# Patient Record
Sex: Female | Born: 1940
Health system: Southern US, Community
[De-identification: ages and names within clinical notes are randomized; demographics above are authoritative.]

## PROBLEM LIST (undated history)

## (undated) DIAGNOSIS — G4730 Sleep apnea, unspecified: Secondary | ICD-10-CM

## (undated) DIAGNOSIS — D649 Anemia, unspecified: Secondary | ICD-10-CM

## (undated) DIAGNOSIS — R600 Localized edema: Secondary | ICD-10-CM

## (undated) DIAGNOSIS — I509 Heart failure, unspecified: Secondary | ICD-10-CM

## (undated) DIAGNOSIS — I10 Essential (primary) hypertension: Secondary | ICD-10-CM

## (undated) DIAGNOSIS — R809 Proteinuria, unspecified: Secondary | ICD-10-CM

## (undated) DIAGNOSIS — M1990 Unspecified osteoarthritis, unspecified site: Secondary | ICD-10-CM

## (undated) DIAGNOSIS — M2550 Pain in unspecified joint: Secondary | ICD-10-CM

## (undated) DIAGNOSIS — Z8711 Personal history of peptic ulcer disease: Secondary | ICD-10-CM

## (undated) DIAGNOSIS — R5383 Other fatigue: Secondary | ICD-10-CM

## (undated) DIAGNOSIS — R0602 Shortness of breath: Secondary | ICD-10-CM

## (undated) DIAGNOSIS — H269 Unspecified cataract: Secondary | ICD-10-CM

## (undated) DIAGNOSIS — G2581 Restless legs syndrome: Secondary | ICD-10-CM

## (undated) DIAGNOSIS — H3530 Unspecified macular degeneration: Secondary | ICD-10-CM

## (undated) DIAGNOSIS — E538 Deficiency of other specified B group vitamins: Secondary | ICD-10-CM

## (undated) DIAGNOSIS — R1310 Dysphagia, unspecified: Secondary | ICD-10-CM

## (undated) DIAGNOSIS — K5900 Constipation, unspecified: Secondary | ICD-10-CM

## (undated) DIAGNOSIS — J309 Allergic rhinitis, unspecified: Secondary | ICD-10-CM

## (undated) DIAGNOSIS — K829 Disease of gallbladder, unspecified: Secondary | ICD-10-CM

## (undated) DIAGNOSIS — E785 Hyperlipidemia, unspecified: Secondary | ICD-10-CM

## (undated) DIAGNOSIS — E559 Vitamin D deficiency, unspecified: Secondary | ICD-10-CM

## (undated) DIAGNOSIS — M5430 Sciatica, unspecified side: Secondary | ICD-10-CM

## (undated) DIAGNOSIS — R7303 Prediabetes: Secondary | ICD-10-CM

## (undated) DIAGNOSIS — K219 Gastro-esophageal reflux disease without esophagitis: Secondary | ICD-10-CM

## (undated) DIAGNOSIS — N183 Chronic kidney disease, stage 3 unspecified: Secondary | ICD-10-CM

## (undated) DIAGNOSIS — M549 Dorsalgia, unspecified: Secondary | ICD-10-CM

## (undated) DIAGNOSIS — R609 Edema, unspecified: Secondary | ICD-10-CM

## (undated) HISTORY — DX: Heart failure, unspecified: I50.9

## (undated) HISTORY — DX: Sleep apnea, unspecified: G47.30

## (undated) HISTORY — DX: Disease of gallbladder, unspecified: K82.9

## (undated) HISTORY — DX: Edema, unspecified: R60.9

## (undated) HISTORY — DX: Restless legs syndrome: G25.81

## (undated) HISTORY — DX: Prediabetes: R73.03

## (undated) HISTORY — DX: Vitamin D deficiency, unspecified: E55.9

## (undated) HISTORY — DX: Other fatigue: R53.83

## (undated) HISTORY — DX: Deficiency of other specified B group vitamins: E53.8

## (undated) HISTORY — DX: Anemia, unspecified: D64.9

## (undated) HISTORY — DX: Proteinuria, unspecified: R80.9

## (undated) HISTORY — DX: Unspecified cataract: H26.9

## (undated) HISTORY — DX: Localized edema: R60.0

## (undated) HISTORY — DX: Dorsalgia, unspecified: M54.9

## (undated) HISTORY — DX: Personal history of peptic ulcer disease: Z87.11

## (undated) HISTORY — DX: Shortness of breath: R06.02

## (undated) HISTORY — DX: Unspecified osteoarthritis, unspecified site: M19.90

## (undated) HISTORY — DX: Pain in unspecified joint: M25.50

## (undated) HISTORY — DX: Dysphagia, unspecified: R13.10

## (undated) HISTORY — DX: Gastro-esophageal reflux disease without esophagitis: K21.9

## (undated) HISTORY — DX: Unspecified macular degeneration: H35.30

## (undated) HISTORY — DX: Constipation, unspecified: K59.00

## (undated) HISTORY — DX: Essential (primary) hypertension: I10

## (undated) HISTORY — DX: Allergic rhinitis, unspecified: J30.9

## (undated) HISTORY — DX: Chronic kidney disease, stage 3 unspecified: N18.30

## (undated) HISTORY — DX: Hyperlipidemia, unspecified: E78.5

---

## 1976-11-29 HISTORY — PX: TUBAL LIGATION: SHX77

## 2000-11-29 HISTORY — PX: CHOLECYSTECTOMY: SHX55

## 2008-01-04 ENCOUNTER — Ambulatory Visit: Payer: Self-pay | Admitting: Unknown Physician Specialty

## 2008-03-05 ENCOUNTER — Ambulatory Visit: Payer: Self-pay | Admitting: Gastroenterology

## 2009-09-24 ENCOUNTER — Ambulatory Visit: Payer: Self-pay | Admitting: Unknown Physician Specialty

## 2010-03-10 ENCOUNTER — Encounter: Admission: RE | Admit: 2010-03-10 | Discharge: 2010-03-10 | Payer: Self-pay | Admitting: Orthopedic Surgery

## 2010-11-20 ENCOUNTER — Ambulatory Visit: Payer: Self-pay | Admitting: Unknown Physician Specialty

## 2011-02-26 ENCOUNTER — Ambulatory Visit: Payer: Self-pay | Admitting: Gastroenterology

## 2011-03-02 LAB — PATHOLOGY REPORT

## 2011-03-23 ENCOUNTER — Ambulatory Visit: Payer: Self-pay | Admitting: Gastroenterology

## 2012-06-07 ENCOUNTER — Ambulatory Visit: Payer: Self-pay | Admitting: Internal Medicine

## 2012-10-19 DIAGNOSIS — D510 Vitamin B12 deficiency anemia due to intrinsic factor deficiency: Secondary | ICD-10-CM | POA: Insufficient documentation

## 2012-10-19 DIAGNOSIS — E785 Hyperlipidemia, unspecified: Secondary | ICD-10-CM | POA: Insufficient documentation

## 2012-10-19 DIAGNOSIS — E782 Mixed hyperlipidemia: Secondary | ICD-10-CM | POA: Insufficient documentation

## 2012-10-19 DIAGNOSIS — E559 Vitamin D deficiency, unspecified: Secondary | ICD-10-CM | POA: Insufficient documentation

## 2012-10-19 DIAGNOSIS — E1169 Type 2 diabetes mellitus with other specified complication: Secondary | ICD-10-CM | POA: Insufficient documentation

## 2013-05-16 LAB — HM DEXA SCAN: HM DEXA SCAN: NORMAL

## 2013-06-16 DIAGNOSIS — B372 Candidiasis of skin and nail: Secondary | ICD-10-CM | POA: Insufficient documentation

## 2013-07-27 DIAGNOSIS — I5189 Other ill-defined heart diseases: Secondary | ICD-10-CM | POA: Insufficient documentation

## 2014-05-23 DIAGNOSIS — E669 Obesity, unspecified: Secondary | ICD-10-CM

## 2015-02-11 LAB — TSH: TSH: 3.52 (ref <=5.90)

## 2015-02-18 DIAGNOSIS — H353120 Nonexudative age-related macular degeneration, left eye, stage unspecified: Secondary | ICD-10-CM | POA: Insufficient documentation

## 2015-02-18 DIAGNOSIS — H353210 Exudative age-related macular degeneration, right eye, stage unspecified: Secondary | ICD-10-CM | POA: Insufficient documentation

## 2015-03-31 DIAGNOSIS — J411 Mucopurulent chronic bronchitis: Secondary | ICD-10-CM | POA: Insufficient documentation

## 2015-10-31 DIAGNOSIS — H2513 Age-related nuclear cataract, bilateral: Secondary | ICD-10-CM | POA: Insufficient documentation

## 2015-10-31 DIAGNOSIS — H353124 Nonexudative age-related macular degeneration, left eye, advanced atrophic with subfoveal involvement: Secondary | ICD-10-CM | POA: Insufficient documentation

## 2015-10-31 DIAGNOSIS — H353210 Exudative age-related macular degeneration, right eye, stage unspecified: Secondary | ICD-10-CM | POA: Insufficient documentation

## 2016-01-14 DIAGNOSIS — J0140 Acute pansinusitis, unspecified: Secondary | ICD-10-CM | POA: Diagnosis not present

## 2016-01-14 DIAGNOSIS — M791 Myalgia: Secondary | ICD-10-CM | POA: Diagnosis not present

## 2016-01-14 DIAGNOSIS — J209 Acute bronchitis, unspecified: Secondary | ICD-10-CM | POA: Diagnosis not present

## 2016-01-14 DIAGNOSIS — R05 Cough: Secondary | ICD-10-CM | POA: Diagnosis not present

## 2016-02-27 DIAGNOSIS — H353122 Nonexudative age-related macular degeneration, left eye, intermediate dry stage: Secondary | ICD-10-CM | POA: Diagnosis not present

## 2016-02-27 DIAGNOSIS — H353212 Exudative age-related macular degeneration, right eye, with inactive choroidal neovascularization: Secondary | ICD-10-CM | POA: Diagnosis not present

## 2016-02-27 DIAGNOSIS — H2513 Age-related nuclear cataract, bilateral: Secondary | ICD-10-CM | POA: Diagnosis not present

## 2016-02-27 DIAGNOSIS — H353124 Nonexudative age-related macular degeneration, left eye, advanced atrophic with subfoveal involvement: Secondary | ICD-10-CM | POA: Diagnosis not present

## 2016-02-27 DIAGNOSIS — E119 Type 2 diabetes mellitus without complications: Secondary | ICD-10-CM | POA: Diagnosis not present

## 2016-03-26 DIAGNOSIS — E559 Vitamin D deficiency, unspecified: Secondary | ICD-10-CM | POA: Diagnosis not present

## 2016-03-26 DIAGNOSIS — N183 Chronic kidney disease, stage 3 (moderate): Secondary | ICD-10-CM | POA: Diagnosis not present

## 2016-03-26 DIAGNOSIS — E1122 Type 2 diabetes mellitus with diabetic chronic kidney disease: Secondary | ICD-10-CM | POA: Diagnosis not present

## 2016-03-26 DIAGNOSIS — D510 Vitamin B12 deficiency anemia due to intrinsic factor deficiency: Secondary | ICD-10-CM | POA: Diagnosis not present

## 2016-03-26 DIAGNOSIS — I10 Essential (primary) hypertension: Secondary | ICD-10-CM | POA: Diagnosis not present

## 2016-03-26 DIAGNOSIS — E782 Mixed hyperlipidemia: Secondary | ICD-10-CM | POA: Diagnosis not present

## 2016-03-26 LAB — LIPID PANEL
CHOLESTEROL: 203 — AB (ref 0–200)
HDL: 68 (ref 35–70)
LDL CALC: 103
Triglycerides: 159 (ref 40–160)

## 2016-03-26 LAB — BASIC METABOLIC PANEL
BUN: 18 (ref 4–21)
CREATININE: 1 (ref 0.5–1.1)
Glucose: 107

## 2016-03-26 LAB — MICROALBUMIN, URINE: Microalb, Ur: <3

## 2016-04-02 DIAGNOSIS — N183 Chronic kidney disease, stage 3 unspecified: Secondary | ICD-10-CM | POA: Insufficient documentation

## 2016-04-02 DIAGNOSIS — I10 Essential (primary) hypertension: Secondary | ICD-10-CM | POA: Diagnosis not present

## 2016-04-02 DIAGNOSIS — R1033 Periumbilical pain: Secondary | ICD-10-CM | POA: Diagnosis not present

## 2016-04-02 DIAGNOSIS — R7309 Other abnormal glucose: Secondary | ICD-10-CM | POA: Diagnosis not present

## 2016-04-02 DIAGNOSIS — R10816 Epigastric abdominal tenderness: Secondary | ICD-10-CM | POA: Diagnosis not present

## 2016-04-02 DIAGNOSIS — Z1231 Encounter for screening mammogram for malignant neoplasm of breast: Secondary | ICD-10-CM | POA: Diagnosis not present

## 2016-04-02 DIAGNOSIS — J3089 Other allergic rhinitis: Secondary | ICD-10-CM | POA: Diagnosis not present

## 2016-04-02 DIAGNOSIS — E559 Vitamin D deficiency, unspecified: Secondary | ICD-10-CM | POA: Diagnosis not present

## 2016-04-02 DIAGNOSIS — E782 Mixed hyperlipidemia: Secondary | ICD-10-CM | POA: Diagnosis not present

## 2016-04-08 DIAGNOSIS — R1033 Periumbilical pain: Secondary | ICD-10-CM | POA: Diagnosis not present

## 2016-05-03 DIAGNOSIS — J343 Hypertrophy of nasal turbinates: Secondary | ICD-10-CM | POA: Diagnosis not present

## 2016-05-03 DIAGNOSIS — R51 Headache: Secondary | ICD-10-CM | POA: Diagnosis not present

## 2016-05-03 DIAGNOSIS — J331 Polypoid sinus degeneration: Secondary | ICD-10-CM | POA: Diagnosis not present

## 2016-05-03 DIAGNOSIS — J0141 Acute recurrent pansinusitis: Secondary | ICD-10-CM | POA: Diagnosis not present

## 2016-07-12 DIAGNOSIS — H353122 Nonexudative age-related macular degeneration, left eye, intermediate dry stage: Secondary | ICD-10-CM | POA: Diagnosis not present

## 2016-07-12 DIAGNOSIS — H353212 Exudative age-related macular degeneration, right eye, with inactive choroidal neovascularization: Secondary | ICD-10-CM | POA: Diagnosis not present

## 2016-07-13 DIAGNOSIS — K117 Disturbances of salivary secretion: Secondary | ICD-10-CM | POA: Diagnosis not present

## 2016-07-13 DIAGNOSIS — L298 Other pruritus: Secondary | ICD-10-CM | POA: Diagnosis not present

## 2016-07-13 DIAGNOSIS — L508 Other urticaria: Secondary | ICD-10-CM | POA: Diagnosis not present

## 2016-07-13 DIAGNOSIS — J328 Other chronic sinusitis: Secondary | ICD-10-CM | POA: Diagnosis not present

## 2016-07-13 DIAGNOSIS — J3081 Allergic rhinitis due to animal (cat) (dog) hair and dander: Secondary | ICD-10-CM | POA: Diagnosis not present

## 2016-07-13 DIAGNOSIS — R0981 Nasal congestion: Secondary | ICD-10-CM | POA: Diagnosis not present

## 2016-07-13 DIAGNOSIS — J301 Allergic rhinitis due to pollen: Secondary | ICD-10-CM | POA: Diagnosis not present

## 2016-07-13 DIAGNOSIS — J302 Other seasonal allergic rhinitis: Secondary | ICD-10-CM | POA: Diagnosis not present

## 2016-07-14 ENCOUNTER — Ambulatory Visit
Admission: RE | Admit: 2016-07-14 | Discharge: 2016-07-14 | Disposition: A | Discharge status: Home / Self Care | Payer: Medicare Other | Source: Ambulatory Visit | Attending: Otolaryngology | Admitting: Otolaryngology

## 2016-07-14 ENCOUNTER — Other Ambulatory Visit: Payer: Self-pay | Admitting: Internal Medicine

## 2016-07-14 ENCOUNTER — Other Ambulatory Visit: Payer: Self-pay | Admitting: Otolaryngology

## 2016-07-14 DIAGNOSIS — R51 Headache: Secondary | ICD-10-CM | POA: Diagnosis not present

## 2016-07-14 DIAGNOSIS — J328 Other chronic sinusitis: Secondary | ICD-10-CM

## 2016-07-21 DIAGNOSIS — J309 Allergic rhinitis, unspecified: Secondary | ICD-10-CM | POA: Diagnosis not present

## 2016-07-21 DIAGNOSIS — J0100 Acute maxillary sinusitis, unspecified: Secondary | ICD-10-CM | POA: Diagnosis not present

## 2016-08-05 DIAGNOSIS — J301 Allergic rhinitis due to pollen: Secondary | ICD-10-CM | POA: Diagnosis not present

## 2016-08-05 DIAGNOSIS — J302 Other seasonal allergic rhinitis: Secondary | ICD-10-CM | POA: Diagnosis not present

## 2016-08-05 DIAGNOSIS — L298 Other pruritus: Secondary | ICD-10-CM | POA: Diagnosis not present

## 2016-08-05 DIAGNOSIS — J328 Other chronic sinusitis: Secondary | ICD-10-CM | POA: Diagnosis not present

## 2016-08-05 DIAGNOSIS — R0981 Nasal congestion: Secondary | ICD-10-CM | POA: Diagnosis not present

## 2016-08-05 DIAGNOSIS — K117 Disturbances of salivary secretion: Secondary | ICD-10-CM | POA: Diagnosis not present

## 2016-08-05 DIAGNOSIS — J3081 Allergic rhinitis due to animal (cat) (dog) hair and dander: Secondary | ICD-10-CM | POA: Diagnosis not present

## 2016-08-05 DIAGNOSIS — L508 Other urticaria: Secondary | ICD-10-CM | POA: Diagnosis not present

## 2016-08-19 DIAGNOSIS — R0981 Nasal congestion: Secondary | ICD-10-CM | POA: Diagnosis not present

## 2016-08-19 DIAGNOSIS — L508 Other urticaria: Secondary | ICD-10-CM | POA: Diagnosis not present

## 2016-08-19 DIAGNOSIS — K117 Disturbances of salivary secretion: Secondary | ICD-10-CM | POA: Diagnosis not present

## 2016-08-19 DIAGNOSIS — J328 Other chronic sinusitis: Secondary | ICD-10-CM | POA: Diagnosis not present

## 2016-08-19 DIAGNOSIS — J302 Other seasonal allergic rhinitis: Secondary | ICD-10-CM | POA: Diagnosis not present

## 2016-08-19 DIAGNOSIS — L298 Other pruritus: Secondary | ICD-10-CM | POA: Diagnosis not present

## 2016-08-19 DIAGNOSIS — J3081 Allergic rhinitis due to animal (cat) (dog) hair and dander: Secondary | ICD-10-CM | POA: Diagnosis not present

## 2016-08-19 DIAGNOSIS — J301 Allergic rhinitis due to pollen: Secondary | ICD-10-CM | POA: Diagnosis not present

## 2016-08-20 DIAGNOSIS — R682 Dry mouth, unspecified: Secondary | ICD-10-CM | POA: Diagnosis not present

## 2016-08-20 DIAGNOSIS — M79602 Pain in left arm: Secondary | ICD-10-CM | POA: Diagnosis not present

## 2016-08-20 DIAGNOSIS — J3089 Other allergic rhinitis: Secondary | ICD-10-CM | POA: Diagnosis not present

## 2016-08-20 DIAGNOSIS — I10 Essential (primary) hypertension: Secondary | ICD-10-CM | POA: Diagnosis not present

## 2016-08-20 DIAGNOSIS — R51 Headache: Secondary | ICD-10-CM | POA: Diagnosis not present

## 2016-08-24 DIAGNOSIS — I10 Essential (primary) hypertension: Secondary | ICD-10-CM | POA: Diagnosis not present

## 2016-08-24 DIAGNOSIS — R682 Dry mouth, unspecified: Secondary | ICD-10-CM | POA: Diagnosis not present

## 2016-08-24 DIAGNOSIS — E559 Vitamin D deficiency, unspecified: Secondary | ICD-10-CM | POA: Diagnosis not present

## 2016-08-24 DIAGNOSIS — E782 Mixed hyperlipidemia: Secondary | ICD-10-CM | POA: Diagnosis not present

## 2016-08-24 DIAGNOSIS — R7309 Other abnormal glucose: Secondary | ICD-10-CM | POA: Diagnosis not present

## 2016-08-26 DIAGNOSIS — J328 Other chronic sinusitis: Secondary | ICD-10-CM | POA: Diagnosis not present

## 2016-08-26 DIAGNOSIS — K117 Disturbances of salivary secretion: Secondary | ICD-10-CM | POA: Diagnosis not present

## 2016-08-26 DIAGNOSIS — J301 Allergic rhinitis due to pollen: Secondary | ICD-10-CM | POA: Diagnosis not present

## 2016-08-26 DIAGNOSIS — L298 Other pruritus: Secondary | ICD-10-CM | POA: Diagnosis not present

## 2016-08-26 DIAGNOSIS — J3081 Allergic rhinitis due to animal (cat) (dog) hair and dander: Secondary | ICD-10-CM | POA: Diagnosis not present

## 2016-08-26 DIAGNOSIS — J302 Other seasonal allergic rhinitis: Secondary | ICD-10-CM | POA: Diagnosis not present

## 2016-08-26 DIAGNOSIS — L508 Other urticaria: Secondary | ICD-10-CM | POA: Diagnosis not present

## 2016-09-02 DIAGNOSIS — J328 Other chronic sinusitis: Secondary | ICD-10-CM | POA: Diagnosis not present

## 2016-09-02 DIAGNOSIS — J302 Other seasonal allergic rhinitis: Secondary | ICD-10-CM | POA: Diagnosis not present

## 2016-09-02 DIAGNOSIS — J3081 Allergic rhinitis due to animal (cat) (dog) hair and dander: Secondary | ICD-10-CM | POA: Diagnosis not present

## 2016-09-07 DIAGNOSIS — Z1231 Encounter for screening mammogram for malignant neoplasm of breast: Secondary | ICD-10-CM | POA: Diagnosis not present

## 2016-09-07 LAB — HM MAMMOGRAPHY

## 2016-09-09 DIAGNOSIS — J302 Other seasonal allergic rhinitis: Secondary | ICD-10-CM | POA: Diagnosis not present

## 2016-09-09 DIAGNOSIS — J328 Other chronic sinusitis: Secondary | ICD-10-CM | POA: Diagnosis not present

## 2016-09-09 DIAGNOSIS — J3081 Allergic rhinitis due to animal (cat) (dog) hair and dander: Secondary | ICD-10-CM | POA: Diagnosis not present

## 2016-09-16 DIAGNOSIS — J302 Other seasonal allergic rhinitis: Secondary | ICD-10-CM | POA: Diagnosis not present

## 2016-09-16 DIAGNOSIS — J328 Other chronic sinusitis: Secondary | ICD-10-CM | POA: Diagnosis not present

## 2016-09-16 DIAGNOSIS — J3081 Allergic rhinitis due to animal (cat) (dog) hair and dander: Secondary | ICD-10-CM | POA: Diagnosis not present

## 2016-09-17 DIAGNOSIS — Z23 Encounter for immunization: Secondary | ICD-10-CM | POA: Diagnosis not present

## 2016-09-17 DIAGNOSIS — R600 Localized edema: Secondary | ICD-10-CM | POA: Diagnosis not present

## 2016-09-17 DIAGNOSIS — D510 Vitamin B12 deficiency anemia due to intrinsic factor deficiency: Secondary | ICD-10-CM | POA: Diagnosis not present

## 2016-09-17 DIAGNOSIS — R7309 Other abnormal glucose: Secondary | ICD-10-CM | POA: Diagnosis not present

## 2016-09-17 DIAGNOSIS — E559 Vitamin D deficiency, unspecified: Secondary | ICD-10-CM | POA: Diagnosis not present

## 2016-09-17 DIAGNOSIS — J3089 Other allergic rhinitis: Secondary | ICD-10-CM | POA: Diagnosis not present

## 2016-09-17 DIAGNOSIS — I10 Essential (primary) hypertension: Secondary | ICD-10-CM | POA: Diagnosis not present

## 2016-09-17 DIAGNOSIS — E782 Mixed hyperlipidemia: Secondary | ICD-10-CM | POA: Diagnosis not present

## 2016-09-23 DIAGNOSIS — J328 Other chronic sinusitis: Secondary | ICD-10-CM | POA: Diagnosis not present

## 2016-09-23 DIAGNOSIS — J3081 Allergic rhinitis due to animal (cat) (dog) hair and dander: Secondary | ICD-10-CM | POA: Diagnosis not present

## 2016-09-23 DIAGNOSIS — J302 Other seasonal allergic rhinitis: Secondary | ICD-10-CM | POA: Diagnosis not present

## 2016-09-30 DIAGNOSIS — J3081 Allergic rhinitis due to animal (cat) (dog) hair and dander: Secondary | ICD-10-CM | POA: Diagnosis not present

## 2016-09-30 DIAGNOSIS — J328 Other chronic sinusitis: Secondary | ICD-10-CM | POA: Diagnosis not present

## 2016-09-30 DIAGNOSIS — J302 Other seasonal allergic rhinitis: Secondary | ICD-10-CM | POA: Diagnosis not present

## 2016-10-07 DIAGNOSIS — J328 Other chronic sinusitis: Secondary | ICD-10-CM | POA: Diagnosis not present

## 2016-10-07 DIAGNOSIS — J3081 Allergic rhinitis due to animal (cat) (dog) hair and dander: Secondary | ICD-10-CM | POA: Diagnosis not present

## 2016-10-07 DIAGNOSIS — J302 Other seasonal allergic rhinitis: Secondary | ICD-10-CM | POA: Diagnosis not present

## 2016-10-28 DIAGNOSIS — J328 Other chronic sinusitis: Secondary | ICD-10-CM | POA: Diagnosis not present

## 2016-10-28 DIAGNOSIS — J3081 Allergic rhinitis due to animal (cat) (dog) hair and dander: Secondary | ICD-10-CM | POA: Diagnosis not present

## 2016-10-28 DIAGNOSIS — J302 Other seasonal allergic rhinitis: Secondary | ICD-10-CM | POA: Diagnosis not present

## 2016-11-04 DIAGNOSIS — J328 Other chronic sinusitis: Secondary | ICD-10-CM | POA: Diagnosis not present

## 2016-11-04 DIAGNOSIS — J302 Other seasonal allergic rhinitis: Secondary | ICD-10-CM | POA: Diagnosis not present

## 2016-11-04 DIAGNOSIS — J3081 Allergic rhinitis due to animal (cat) (dog) hair and dander: Secondary | ICD-10-CM | POA: Diagnosis not present

## 2016-11-11 DIAGNOSIS — J328 Other chronic sinusitis: Secondary | ICD-10-CM | POA: Diagnosis not present

## 2016-11-11 DIAGNOSIS — J302 Other seasonal allergic rhinitis: Secondary | ICD-10-CM | POA: Diagnosis not present

## 2016-11-11 DIAGNOSIS — J3081 Allergic rhinitis due to animal (cat) (dog) hair and dander: Secondary | ICD-10-CM | POA: Diagnosis not present

## 2016-11-18 DIAGNOSIS — J328 Other chronic sinusitis: Secondary | ICD-10-CM | POA: Diagnosis not present

## 2016-11-18 DIAGNOSIS — J3081 Allergic rhinitis due to animal (cat) (dog) hair and dander: Secondary | ICD-10-CM | POA: Diagnosis not present

## 2016-11-18 DIAGNOSIS — J302 Other seasonal allergic rhinitis: Secondary | ICD-10-CM | POA: Diagnosis not present

## 2016-11-25 DIAGNOSIS — J3081 Allergic rhinitis due to animal (cat) (dog) hair and dander: Secondary | ICD-10-CM | POA: Diagnosis not present

## 2016-11-25 DIAGNOSIS — J302 Other seasonal allergic rhinitis: Secondary | ICD-10-CM | POA: Diagnosis not present

## 2016-11-25 DIAGNOSIS — J328 Other chronic sinusitis: Secondary | ICD-10-CM | POA: Diagnosis not present

## 2016-12-17 DIAGNOSIS — J302 Other seasonal allergic rhinitis: Secondary | ICD-10-CM | POA: Diagnosis not present

## 2016-12-17 DIAGNOSIS — J3081 Allergic rhinitis due to animal (cat) (dog) hair and dander: Secondary | ICD-10-CM | POA: Diagnosis not present

## 2016-12-17 DIAGNOSIS — J328 Other chronic sinusitis: Secondary | ICD-10-CM | POA: Diagnosis not present

## 2016-12-20 DIAGNOSIS — R7309 Other abnormal glucose: Secondary | ICD-10-CM | POA: Diagnosis not present

## 2016-12-20 DIAGNOSIS — R600 Localized edema: Secondary | ICD-10-CM | POA: Diagnosis not present

## 2016-12-20 DIAGNOSIS — R3915 Urgency of urination: Secondary | ICD-10-CM | POA: Diagnosis not present

## 2016-12-20 DIAGNOSIS — E782 Mixed hyperlipidemia: Secondary | ICD-10-CM | POA: Diagnosis not present

## 2016-12-20 DIAGNOSIS — E559 Vitamin D deficiency, unspecified: Secondary | ICD-10-CM | POA: Diagnosis not present

## 2016-12-20 DIAGNOSIS — Z86010 Personal history of colonic polyps: Secondary | ICD-10-CM | POA: Diagnosis not present

## 2016-12-20 DIAGNOSIS — R1013 Epigastric pain: Secondary | ICD-10-CM | POA: Diagnosis not present

## 2016-12-20 DIAGNOSIS — D510 Vitamin B12 deficiency anemia due to intrinsic factor deficiency: Secondary | ICD-10-CM | POA: Diagnosis not present

## 2016-12-20 DIAGNOSIS — I10 Essential (primary) hypertension: Secondary | ICD-10-CM | POA: Diagnosis not present

## 2016-12-21 DIAGNOSIS — K625 Hemorrhage of anus and rectum: Secondary | ICD-10-CM | POA: Diagnosis not present

## 2016-12-21 DIAGNOSIS — R1310 Dysphagia, unspecified: Secondary | ICD-10-CM | POA: Diagnosis not present

## 2016-12-21 DIAGNOSIS — R1013 Epigastric pain: Secondary | ICD-10-CM | POA: Diagnosis not present

## 2016-12-21 DIAGNOSIS — R140 Abdominal distension (gaseous): Secondary | ICD-10-CM | POA: Diagnosis not present

## 2016-12-21 DIAGNOSIS — Z86010 Personal history of colonic polyps: Secondary | ICD-10-CM | POA: Diagnosis not present

## 2016-12-23 DIAGNOSIS — Z86010 Personal history of colonic polyps: Secondary | ICD-10-CM | POA: Insufficient documentation

## 2016-12-23 DIAGNOSIS — K625 Hemorrhage of anus and rectum: Secondary | ICD-10-CM | POA: Insufficient documentation

## 2016-12-23 DIAGNOSIS — R1013 Epigastric pain: Secondary | ICD-10-CM | POA: Insufficient documentation

## 2016-12-23 DIAGNOSIS — R1310 Dysphagia, unspecified: Secondary | ICD-10-CM | POA: Insufficient documentation

## 2016-12-23 DIAGNOSIS — J3081 Allergic rhinitis due to animal (cat) (dog) hair and dander: Secondary | ICD-10-CM | POA: Diagnosis not present

## 2016-12-23 DIAGNOSIS — J302 Other seasonal allergic rhinitis: Secondary | ICD-10-CM | POA: Diagnosis not present

## 2016-12-23 DIAGNOSIS — R12 Heartburn: Secondary | ICD-10-CM | POA: Insufficient documentation

## 2016-12-23 DIAGNOSIS — J328 Other chronic sinusitis: Secondary | ICD-10-CM | POA: Diagnosis not present

## 2016-12-23 DIAGNOSIS — R140 Abdominal distension (gaseous): Secondary | ICD-10-CM | POA: Insufficient documentation

## 2017-01-06 DIAGNOSIS — J328 Other chronic sinusitis: Secondary | ICD-10-CM | POA: Diagnosis not present

## 2017-01-06 DIAGNOSIS — J3081 Allergic rhinitis due to animal (cat) (dog) hair and dander: Secondary | ICD-10-CM | POA: Diagnosis not present

## 2017-01-06 DIAGNOSIS — J302 Other seasonal allergic rhinitis: Secondary | ICD-10-CM | POA: Diagnosis not present

## 2017-01-12 DIAGNOSIS — D120 Benign neoplasm of cecum: Secondary | ICD-10-CM | POA: Diagnosis not present

## 2017-01-12 DIAGNOSIS — K317 Polyp of stomach and duodenum: Secondary | ICD-10-CM | POA: Insufficient documentation

## 2017-01-12 DIAGNOSIS — I10 Essential (primary) hypertension: Secondary | ICD-10-CM | POA: Diagnosis not present

## 2017-01-12 DIAGNOSIS — D175 Benign lipomatous neoplasm of intra-abdominal organs: Secondary | ICD-10-CM | POA: Diagnosis not present

## 2017-01-12 DIAGNOSIS — M8580 Other specified disorders of bone density and structure, unspecified site: Secondary | ICD-10-CM | POA: Diagnosis not present

## 2017-01-12 DIAGNOSIS — K222 Esophageal obstruction: Secondary | ICD-10-CM | POA: Insufficient documentation

## 2017-01-12 DIAGNOSIS — Z86010 Personal history of colonic polyps: Secondary | ICD-10-CM | POA: Diagnosis not present

## 2017-01-12 DIAGNOSIS — Z888 Allergy status to other drugs, medicaments and biological substances status: Secondary | ICD-10-CM | POA: Diagnosis not present

## 2017-01-12 DIAGNOSIS — M1990 Unspecified osteoarthritis, unspecified site: Secondary | ICD-10-CM | POA: Diagnosis not present

## 2017-01-12 DIAGNOSIS — K449 Diaphragmatic hernia without obstruction or gangrene: Secondary | ICD-10-CM | POA: Insufficient documentation

## 2017-01-12 DIAGNOSIS — D123 Benign neoplasm of transverse colon: Secondary | ICD-10-CM | POA: Diagnosis not present

## 2017-01-12 DIAGNOSIS — K648 Other hemorrhoids: Secondary | ICD-10-CM | POA: Diagnosis not present

## 2017-01-12 DIAGNOSIS — K3189 Other diseases of stomach and duodenum: Secondary | ICD-10-CM | POA: Diagnosis not present

## 2017-01-12 DIAGNOSIS — D124 Benign neoplasm of descending colon: Secondary | ICD-10-CM | POA: Diagnosis not present

## 2017-01-12 DIAGNOSIS — D125 Benign neoplasm of sigmoid colon: Secondary | ICD-10-CM | POA: Diagnosis not present

## 2017-01-12 DIAGNOSIS — K644 Residual hemorrhoidal skin tags: Secondary | ICD-10-CM | POA: Diagnosis not present

## 2017-01-12 DIAGNOSIS — E785 Hyperlipidemia, unspecified: Secondary | ICD-10-CM | POA: Diagnosis not present

## 2017-01-12 DIAGNOSIS — K5730 Diverticulosis of large intestine without perforation or abscess without bleeding: Secondary | ICD-10-CM | POA: Diagnosis not present

## 2017-01-12 DIAGNOSIS — K219 Gastro-esophageal reflux disease without esophagitis: Secondary | ICD-10-CM | POA: Diagnosis not present

## 2017-01-12 DIAGNOSIS — Z87891 Personal history of nicotine dependence: Secondary | ICD-10-CM | POA: Diagnosis not present

## 2017-01-12 DIAGNOSIS — K635 Polyp of colon: Secondary | ICD-10-CM | POA: Insufficient documentation

## 2017-01-12 DIAGNOSIS — Z6841 Body Mass Index (BMI) 40.0 and over, adult: Secondary | ICD-10-CM | POA: Diagnosis not present

## 2017-01-12 DIAGNOSIS — D179 Benign lipomatous neoplasm, unspecified: Secondary | ICD-10-CM | POA: Diagnosis not present

## 2017-01-12 DIAGNOSIS — Z881 Allergy status to other antibiotic agents status: Secondary | ICD-10-CM | POA: Diagnosis not present

## 2017-01-15 LAB — HM COLONOSCOPY

## 2017-01-24 DIAGNOSIS — H353212 Exudative age-related macular degeneration, right eye, with inactive choroidal neovascularization: Secondary | ICD-10-CM | POA: Diagnosis not present

## 2017-01-24 DIAGNOSIS — H353122 Nonexudative age-related macular degeneration, left eye, intermediate dry stage: Secondary | ICD-10-CM | POA: Diagnosis not present

## 2017-01-26 DIAGNOSIS — J302 Other seasonal allergic rhinitis: Secondary | ICD-10-CM | POA: Diagnosis not present

## 2017-01-26 DIAGNOSIS — J328 Other chronic sinusitis: Secondary | ICD-10-CM | POA: Diagnosis not present

## 2017-01-26 DIAGNOSIS — J3081 Allergic rhinitis due to animal (cat) (dog) hair and dander: Secondary | ICD-10-CM | POA: Diagnosis not present

## 2017-02-01 DIAGNOSIS — R1013 Epigastric pain: Secondary | ICD-10-CM | POA: Diagnosis not present

## 2017-02-01 DIAGNOSIS — R1310 Dysphagia, unspecified: Secondary | ICD-10-CM | POA: Diagnosis not present

## 2017-02-01 DIAGNOSIS — R197 Diarrhea, unspecified: Secondary | ICD-10-CM | POA: Diagnosis not present

## 2017-02-01 DIAGNOSIS — R110 Nausea: Secondary | ICD-10-CM | POA: Diagnosis not present

## 2017-02-01 DIAGNOSIS — K635 Polyp of colon: Secondary | ICD-10-CM | POA: Diagnosis not present

## 2017-02-03 DIAGNOSIS — R197 Diarrhea, unspecified: Secondary | ICD-10-CM | POA: Diagnosis not present

## 2017-02-03 DIAGNOSIS — J3081 Allergic rhinitis due to animal (cat) (dog) hair and dander: Secondary | ICD-10-CM | POA: Diagnosis not present

## 2017-02-03 DIAGNOSIS — R1013 Epigastric pain: Secondary | ICD-10-CM | POA: Diagnosis not present

## 2017-02-03 DIAGNOSIS — J302 Other seasonal allergic rhinitis: Secondary | ICD-10-CM | POA: Diagnosis not present

## 2017-02-03 DIAGNOSIS — J328 Other chronic sinusitis: Secondary | ICD-10-CM | POA: Diagnosis not present

## 2017-02-03 DIAGNOSIS — K219 Gastro-esophageal reflux disease without esophagitis: Secondary | ICD-10-CM | POA: Diagnosis not present

## 2017-02-03 DIAGNOSIS — R110 Nausea: Secondary | ICD-10-CM | POA: Diagnosis not present

## 2017-02-17 DIAGNOSIS — Z6841 Body Mass Index (BMI) 40.0 and over, adult: Secondary | ICD-10-CM | POA: Diagnosis not present

## 2017-02-17 DIAGNOSIS — J328 Other chronic sinusitis: Secondary | ICD-10-CM | POA: Diagnosis not present

## 2017-02-17 DIAGNOSIS — R0600 Dyspnea, unspecified: Secondary | ICD-10-CM | POA: Diagnosis not present

## 2017-02-17 DIAGNOSIS — R5383 Other fatigue: Secondary | ICD-10-CM | POA: Diagnosis not present

## 2017-02-17 DIAGNOSIS — E559 Vitamin D deficiency, unspecified: Secondary | ICD-10-CM | POA: Diagnosis not present

## 2017-02-17 DIAGNOSIS — R079 Chest pain, unspecified: Secondary | ICD-10-CM | POA: Diagnosis not present

## 2017-02-17 DIAGNOSIS — H8120 Vestibular neuronitis, unspecified ear: Secondary | ICD-10-CM | POA: Diagnosis not present

## 2017-02-17 DIAGNOSIS — J302 Other seasonal allergic rhinitis: Secondary | ICD-10-CM | POA: Diagnosis not present

## 2017-02-17 DIAGNOSIS — R2689 Other abnormalities of gait and mobility: Secondary | ICD-10-CM | POA: Diagnosis not present

## 2017-02-17 DIAGNOSIS — I10 Essential (primary) hypertension: Secondary | ICD-10-CM | POA: Diagnosis not present

## 2017-02-17 DIAGNOSIS — R42 Dizziness and giddiness: Secondary | ICD-10-CM | POA: Diagnosis not present

## 2017-02-17 DIAGNOSIS — J309 Allergic rhinitis, unspecified: Secondary | ICD-10-CM | POA: Diagnosis not present

## 2017-02-17 DIAGNOSIS — R269 Unspecified abnormalities of gait and mobility: Secondary | ICD-10-CM | POA: Diagnosis not present

## 2017-02-17 DIAGNOSIS — R0789 Other chest pain: Secondary | ICD-10-CM | POA: Diagnosis not present

## 2017-02-17 DIAGNOSIS — J3081 Allergic rhinitis due to animal (cat) (dog) hair and dander: Secondary | ICD-10-CM | POA: Diagnosis not present

## 2017-02-17 DIAGNOSIS — R55 Syncope and collapse: Secondary | ICD-10-CM | POA: Diagnosis not present

## 2017-02-24 DIAGNOSIS — R42 Dizziness and giddiness: Secondary | ICD-10-CM | POA: Diagnosis not present

## 2017-02-24 DIAGNOSIS — R2689 Other abnormalities of gait and mobility: Secondary | ICD-10-CM | POA: Diagnosis not present

## 2017-02-24 DIAGNOSIS — E669 Obesity, unspecified: Secondary | ICD-10-CM | POA: Diagnosis not present

## 2017-02-24 DIAGNOSIS — Z6841 Body Mass Index (BMI) 40.0 and over, adult: Secondary | ICD-10-CM | POA: Diagnosis not present

## 2017-02-24 DIAGNOSIS — J302 Other seasonal allergic rhinitis: Secondary | ICD-10-CM | POA: Diagnosis not present

## 2017-02-24 DIAGNOSIS — R079 Chest pain, unspecified: Secondary | ICD-10-CM | POA: Diagnosis not present

## 2017-02-24 DIAGNOSIS — E8881 Metabolic syndrome: Secondary | ICD-10-CM | POA: Diagnosis not present

## 2017-02-24 DIAGNOSIS — R0600 Dyspnea, unspecified: Secondary | ICD-10-CM | POA: Diagnosis not present

## 2017-02-24 DIAGNOSIS — J328 Other chronic sinusitis: Secondary | ICD-10-CM | POA: Diagnosis not present

## 2017-02-24 DIAGNOSIS — J3081 Allergic rhinitis due to animal (cat) (dog) hair and dander: Secondary | ICD-10-CM | POA: Diagnosis not present

## 2017-02-24 DIAGNOSIS — I10 Essential (primary) hypertension: Secondary | ICD-10-CM | POA: Diagnosis not present

## 2017-02-24 DIAGNOSIS — R7303 Prediabetes: Secondary | ICD-10-CM | POA: Diagnosis not present

## 2017-02-24 DIAGNOSIS — E1165 Type 2 diabetes mellitus with hyperglycemia: Secondary | ICD-10-CM | POA: Diagnosis not present

## 2017-02-24 DIAGNOSIS — J309 Allergic rhinitis, unspecified: Secondary | ICD-10-CM | POA: Diagnosis not present

## 2017-02-24 DIAGNOSIS — R0789 Other chest pain: Secondary | ICD-10-CM | POA: Diagnosis not present

## 2017-02-24 DIAGNOSIS — H8120 Vestibular neuronitis, unspecified ear: Secondary | ICD-10-CM | POA: Diagnosis not present

## 2017-03-07 DIAGNOSIS — K449 Diaphragmatic hernia without obstruction or gangrene: Secondary | ICD-10-CM | POA: Diagnosis not present

## 2017-03-07 DIAGNOSIS — R911 Solitary pulmonary nodule: Secondary | ICD-10-CM | POA: Diagnosis not present

## 2017-03-07 DIAGNOSIS — D7389 Other diseases of spleen: Secondary | ICD-10-CM | POA: Diagnosis not present

## 2017-03-07 DIAGNOSIS — D739 Disease of spleen, unspecified: Secondary | ICD-10-CM | POA: Diagnosis not present

## 2017-03-11 DIAGNOSIS — R0789 Other chest pain: Secondary | ICD-10-CM | POA: Diagnosis not present

## 2017-03-11 DIAGNOSIS — J302 Other seasonal allergic rhinitis: Secondary | ICD-10-CM | POA: Diagnosis not present

## 2017-03-11 DIAGNOSIS — R2689 Other abnormalities of gait and mobility: Secondary | ICD-10-CM | POA: Diagnosis not present

## 2017-03-11 DIAGNOSIS — J3081 Allergic rhinitis due to animal (cat) (dog) hair and dander: Secondary | ICD-10-CM | POA: Diagnosis not present

## 2017-03-11 DIAGNOSIS — J328 Other chronic sinusitis: Secondary | ICD-10-CM | POA: Diagnosis not present

## 2017-03-11 DIAGNOSIS — R42 Dizziness and giddiness: Secondary | ICD-10-CM | POA: Diagnosis not present

## 2017-03-17 DIAGNOSIS — R2689 Other abnormalities of gait and mobility: Secondary | ICD-10-CM | POA: Diagnosis not present

## 2017-03-17 DIAGNOSIS — R0981 Nasal congestion: Secondary | ICD-10-CM | POA: Diagnosis not present

## 2017-03-17 DIAGNOSIS — J328 Other chronic sinusitis: Secondary | ICD-10-CM | POA: Diagnosis not present

## 2017-03-17 DIAGNOSIS — R0982 Postnasal drip: Secondary | ICD-10-CM | POA: Diagnosis not present

## 2017-03-17 DIAGNOSIS — R42 Dizziness and giddiness: Secondary | ICD-10-CM | POA: Diagnosis not present

## 2017-03-17 DIAGNOSIS — R05 Cough: Secondary | ICD-10-CM | POA: Diagnosis not present

## 2017-03-17 DIAGNOSIS — R0789 Other chest pain: Secondary | ICD-10-CM | POA: Diagnosis not present

## 2017-03-17 DIAGNOSIS — J302 Other seasonal allergic rhinitis: Secondary | ICD-10-CM | POA: Diagnosis not present

## 2017-03-17 DIAGNOSIS — J3081 Allergic rhinitis due to animal (cat) (dog) hair and dander: Secondary | ICD-10-CM | POA: Diagnosis not present

## 2017-03-24 DIAGNOSIS — J3081 Allergic rhinitis due to animal (cat) (dog) hair and dander: Secondary | ICD-10-CM | POA: Diagnosis not present

## 2017-03-24 DIAGNOSIS — R42 Dizziness and giddiness: Secondary | ICD-10-CM | POA: Diagnosis not present

## 2017-03-24 DIAGNOSIS — R0789 Other chest pain: Secondary | ICD-10-CM | POA: Diagnosis not present

## 2017-03-24 DIAGNOSIS — J302 Other seasonal allergic rhinitis: Secondary | ICD-10-CM | POA: Diagnosis not present

## 2017-03-24 DIAGNOSIS — J328 Other chronic sinusitis: Secondary | ICD-10-CM | POA: Diagnosis not present

## 2017-03-24 DIAGNOSIS — R2689 Other abnormalities of gait and mobility: Secondary | ICD-10-CM | POA: Diagnosis not present

## 2017-03-31 DIAGNOSIS — R2689 Other abnormalities of gait and mobility: Secondary | ICD-10-CM | POA: Diagnosis not present

## 2017-03-31 DIAGNOSIS — R42 Dizziness and giddiness: Secondary | ICD-10-CM | POA: Diagnosis not present

## 2017-03-31 DIAGNOSIS — J328 Other chronic sinusitis: Secondary | ICD-10-CM | POA: Diagnosis not present

## 2017-03-31 DIAGNOSIS — J3081 Allergic rhinitis due to animal (cat) (dog) hair and dander: Secondary | ICD-10-CM | POA: Diagnosis not present

## 2017-03-31 DIAGNOSIS — J302 Other seasonal allergic rhinitis: Secondary | ICD-10-CM | POA: Diagnosis not present

## 2017-03-31 DIAGNOSIS — R0789 Other chest pain: Secondary | ICD-10-CM | POA: Diagnosis not present

## 2017-04-14 DIAGNOSIS — R110 Nausea: Secondary | ICD-10-CM | POA: Diagnosis not present

## 2017-04-14 DIAGNOSIS — R140 Abdominal distension (gaseous): Secondary | ICD-10-CM | POA: Diagnosis not present

## 2017-04-14 DIAGNOSIS — J328 Other chronic sinusitis: Secondary | ICD-10-CM | POA: Diagnosis not present

## 2017-04-14 DIAGNOSIS — R2689 Other abnormalities of gait and mobility: Secondary | ICD-10-CM | POA: Diagnosis not present

## 2017-04-14 DIAGNOSIS — R5383 Other fatigue: Secondary | ICD-10-CM | POA: Diagnosis not present

## 2017-04-14 DIAGNOSIS — R42 Dizziness and giddiness: Secondary | ICD-10-CM | POA: Diagnosis not present

## 2017-04-14 DIAGNOSIS — Z91012 Allergy to eggs: Secondary | ICD-10-CM | POA: Diagnosis not present

## 2017-04-14 DIAGNOSIS — R0789 Other chest pain: Secondary | ICD-10-CM | POA: Diagnosis not present

## 2017-04-14 DIAGNOSIS — K9041 Non-celiac gluten sensitivity: Secondary | ICD-10-CM | POA: Diagnosis not present

## 2017-04-14 DIAGNOSIS — K591 Functional diarrhea: Secondary | ICD-10-CM | POA: Diagnosis not present

## 2017-04-14 DIAGNOSIS — Z889 Allergy status to unspecified drugs, medicaments and biological substances status: Secondary | ICD-10-CM | POA: Diagnosis not present

## 2017-04-14 DIAGNOSIS — J302 Other seasonal allergic rhinitis: Secondary | ICD-10-CM | POA: Diagnosis not present

## 2017-04-14 DIAGNOSIS — J3081 Allergic rhinitis due to animal (cat) (dog) hair and dander: Secondary | ICD-10-CM | POA: Diagnosis not present

## 2017-04-14 DIAGNOSIS — Z91011 Allergy to milk products: Secondary | ICD-10-CM | POA: Diagnosis not present

## 2017-04-17 DIAGNOSIS — R0789 Other chest pain: Secondary | ICD-10-CM | POA: Diagnosis not present

## 2017-04-17 DIAGNOSIS — J302 Other seasonal allergic rhinitis: Secondary | ICD-10-CM | POA: Diagnosis not present

## 2017-04-17 DIAGNOSIS — R2689 Other abnormalities of gait and mobility: Secondary | ICD-10-CM | POA: Diagnosis not present

## 2017-04-17 DIAGNOSIS — R110 Nausea: Secondary | ICD-10-CM | POA: Diagnosis not present

## 2017-04-17 DIAGNOSIS — K591 Functional diarrhea: Secondary | ICD-10-CM | POA: Diagnosis not present

## 2017-04-17 DIAGNOSIS — J328 Other chronic sinusitis: Secondary | ICD-10-CM | POA: Diagnosis not present

## 2017-04-17 DIAGNOSIS — R42 Dizziness and giddiness: Secondary | ICD-10-CM | POA: Diagnosis not present

## 2017-04-17 DIAGNOSIS — J3081 Allergic rhinitis due to animal (cat) (dog) hair and dander: Secondary | ICD-10-CM | POA: Diagnosis not present

## 2017-04-17 DIAGNOSIS — R140 Abdominal distension (gaseous): Secondary | ICD-10-CM | POA: Diagnosis not present

## 2017-04-20 DIAGNOSIS — K591 Functional diarrhea: Secondary | ICD-10-CM | POA: Diagnosis not present

## 2017-04-20 DIAGNOSIS — R42 Dizziness and giddiness: Secondary | ICD-10-CM | POA: Diagnosis not present

## 2017-04-20 DIAGNOSIS — Z91012 Allergy to eggs: Secondary | ICD-10-CM | POA: Diagnosis not present

## 2017-04-20 DIAGNOSIS — J328 Other chronic sinusitis: Secondary | ICD-10-CM | POA: Diagnosis not present

## 2017-04-20 DIAGNOSIS — J3081 Allergic rhinitis due to animal (cat) (dog) hair and dander: Secondary | ICD-10-CM | POA: Diagnosis not present

## 2017-04-20 DIAGNOSIS — R2689 Other abnormalities of gait and mobility: Secondary | ICD-10-CM | POA: Diagnosis not present

## 2017-04-20 DIAGNOSIS — J302 Other seasonal allergic rhinitis: Secondary | ICD-10-CM | POA: Diagnosis not present

## 2017-04-20 DIAGNOSIS — R110 Nausea: Secondary | ICD-10-CM | POA: Diagnosis not present

## 2017-04-20 DIAGNOSIS — R140 Abdominal distension (gaseous): Secondary | ICD-10-CM | POA: Diagnosis not present

## 2017-04-20 DIAGNOSIS — R0789 Other chest pain: Secondary | ICD-10-CM | POA: Diagnosis not present

## 2017-05-06 DIAGNOSIS — J301 Allergic rhinitis due to pollen: Secondary | ICD-10-CM | POA: Diagnosis not present

## 2017-05-06 DIAGNOSIS — J3489 Other specified disorders of nose and nasal sinuses: Secondary | ICD-10-CM | POA: Diagnosis not present

## 2017-05-19 DIAGNOSIS — J301 Allergic rhinitis due to pollen: Secondary | ICD-10-CM | POA: Diagnosis not present

## 2017-05-26 DIAGNOSIS — J301 Allergic rhinitis due to pollen: Secondary | ICD-10-CM | POA: Diagnosis not present

## 2017-06-06 DIAGNOSIS — J301 Allergic rhinitis due to pollen: Secondary | ICD-10-CM | POA: Diagnosis not present

## 2017-06-06 DIAGNOSIS — I10 Essential (primary) hypertension: Secondary | ICD-10-CM | POA: Diagnosis not present

## 2017-06-16 DIAGNOSIS — J301 Allergic rhinitis due to pollen: Secondary | ICD-10-CM | POA: Diagnosis not present

## 2017-06-23 DIAGNOSIS — J301 Allergic rhinitis due to pollen: Secondary | ICD-10-CM | POA: Diagnosis not present

## 2017-07-05 DIAGNOSIS — Z23 Encounter for immunization: Secondary | ICD-10-CM | POA: Diagnosis not present

## 2017-07-05 DIAGNOSIS — E119 Type 2 diabetes mellitus without complications: Secondary | ICD-10-CM | POA: Insufficient documentation

## 2017-07-05 DIAGNOSIS — I119 Hypertensive heart disease without heart failure: Secondary | ICD-10-CM | POA: Diagnosis not present

## 2017-07-05 DIAGNOSIS — G2581 Restless legs syndrome: Secondary | ICD-10-CM | POA: Diagnosis not present

## 2017-07-05 DIAGNOSIS — K219 Gastro-esophageal reflux disease without esophagitis: Secondary | ICD-10-CM | POA: Diagnosis not present

## 2017-07-05 DIAGNOSIS — R0683 Snoring: Secondary | ICD-10-CM | POA: Diagnosis not present

## 2017-07-05 DIAGNOSIS — R7303 Prediabetes: Secondary | ICD-10-CM | POA: Diagnosis not present

## 2017-07-05 DIAGNOSIS — I10 Essential (primary) hypertension: Secondary | ICD-10-CM | POA: Diagnosis not present

## 2017-07-07 DIAGNOSIS — J301 Allergic rhinitis due to pollen: Secondary | ICD-10-CM | POA: Diagnosis not present

## 2017-07-14 DIAGNOSIS — M2242 Chondromalacia patellae, left knee: Secondary | ICD-10-CM | POA: Diagnosis not present

## 2017-07-14 DIAGNOSIS — J301 Allergic rhinitis due to pollen: Secondary | ICD-10-CM | POA: Diagnosis not present

## 2017-07-14 DIAGNOSIS — M4726 Other spondylosis with radiculopathy, lumbar region: Secondary | ICD-10-CM | POA: Diagnosis not present

## 2017-07-14 DIAGNOSIS — M25551 Pain in right hip: Secondary | ICD-10-CM | POA: Diagnosis not present

## 2017-07-14 DIAGNOSIS — M2241 Chondromalacia patellae, right knee: Secondary | ICD-10-CM | POA: Diagnosis not present

## 2017-07-18 DIAGNOSIS — H353212 Exudative age-related macular degeneration, right eye, with inactive choroidal neovascularization: Secondary | ICD-10-CM | POA: Diagnosis not present

## 2017-07-18 DIAGNOSIS — H353122 Nonexudative age-related macular degeneration, left eye, intermediate dry stage: Secondary | ICD-10-CM | POA: Diagnosis not present

## 2017-07-18 DIAGNOSIS — I119 Hypertensive heart disease without heart failure: Secondary | ICD-10-CM | POA: Insufficient documentation

## 2017-07-18 DIAGNOSIS — R0683 Snoring: Secondary | ICD-10-CM | POA: Insufficient documentation

## 2017-07-29 DIAGNOSIS — M4726 Other spondylosis with radiculopathy, lumbar region: Secondary | ICD-10-CM | POA: Diagnosis not present

## 2017-07-29 DIAGNOSIS — M2242 Chondromalacia patellae, left knee: Secondary | ICD-10-CM | POA: Diagnosis not present

## 2017-08-17 DIAGNOSIS — M238X2 Other internal derangements of left knee: Secondary | ICD-10-CM | POA: Diagnosis not present

## 2017-08-17 DIAGNOSIS — M4727 Other spondylosis with radiculopathy, lumbosacral region: Secondary | ICD-10-CM | POA: Diagnosis not present

## 2017-08-17 DIAGNOSIS — M65862 Other synovitis and tenosynovitis, left lower leg: Secondary | ICD-10-CM | POA: Diagnosis not present

## 2017-08-17 DIAGNOSIS — M25462 Effusion, left knee: Secondary | ICD-10-CM | POA: Diagnosis not present

## 2017-08-17 DIAGNOSIS — M5117 Intervertebral disc disorders with radiculopathy, lumbosacral region: Secondary | ICD-10-CM | POA: Diagnosis not present

## 2017-08-17 DIAGNOSIS — M4319 Spondylolisthesis, multiple sites in spine: Secondary | ICD-10-CM | POA: Diagnosis not present

## 2017-08-17 DIAGNOSIS — M7122 Synovial cyst of popliteal space [Baker], left knee: Secondary | ICD-10-CM | POA: Diagnosis not present

## 2017-08-17 DIAGNOSIS — M1712 Unilateral primary osteoarthritis, left knee: Secondary | ICD-10-CM | POA: Diagnosis not present

## 2017-08-17 DIAGNOSIS — M4726 Other spondylosis with radiculopathy, lumbar region: Secondary | ICD-10-CM | POA: Diagnosis not present

## 2017-08-17 DIAGNOSIS — M23342 Other meniscus derangements, anterior horn of lateral meniscus, left knee: Secondary | ICD-10-CM | POA: Diagnosis not present

## 2017-08-17 DIAGNOSIS — R600 Localized edema: Secondary | ICD-10-CM | POA: Diagnosis not present

## 2017-08-17 DIAGNOSIS — M5116 Intervertebral disc disorders with radiculopathy, lumbar region: Secondary | ICD-10-CM | POA: Diagnosis not present

## 2017-08-19 DIAGNOSIS — M4726 Other spondylosis with radiculopathy, lumbar region: Secondary | ICD-10-CM | POA: Diagnosis not present

## 2017-08-19 DIAGNOSIS — M1712 Unilateral primary osteoarthritis, left knee: Secondary | ICD-10-CM | POA: Diagnosis not present

## 2017-08-22 DIAGNOSIS — J301 Allergic rhinitis due to pollen: Secondary | ICD-10-CM | POA: Diagnosis not present

## 2017-08-24 DIAGNOSIS — M25662 Stiffness of left knee, not elsewhere classified: Secondary | ICD-10-CM | POA: Diagnosis not present

## 2017-08-24 DIAGNOSIS — M25562 Pain in left knee: Secondary | ICD-10-CM | POA: Diagnosis not present

## 2017-08-29 ENCOUNTER — Encounter: Payer: Self-pay | Admitting: Family Medicine

## 2017-08-29 ENCOUNTER — Ambulatory Visit (INDEPENDENT_AMBULATORY_CARE_PROVIDER_SITE_OTHER): Payer: Medicare Other | Admitting: Family Medicine

## 2017-08-29 VITALS — BP 136/90 | HR 76 | Temp 97.3°F | Resp 16 | Ht 61.0 in | Wt 242.0 lb

## 2017-08-29 DIAGNOSIS — Z23 Encounter for immunization: Secondary | ICD-10-CM | POA: Diagnosis not present

## 2017-08-29 DIAGNOSIS — K219 Gastro-esophageal reflux disease without esophagitis: Secondary | ICD-10-CM | POA: Diagnosis not present

## 2017-08-29 DIAGNOSIS — H3530 Unspecified macular degeneration: Secondary | ICD-10-CM | POA: Diagnosis not present

## 2017-08-29 DIAGNOSIS — H269 Unspecified cataract: Secondary | ICD-10-CM | POA: Insufficient documentation

## 2017-08-29 DIAGNOSIS — I10 Essential (primary) hypertension: Secondary | ICD-10-CM | POA: Diagnosis not present

## 2017-08-29 DIAGNOSIS — G2581 Restless legs syndrome: Secondary | ICD-10-CM | POA: Diagnosis not present

## 2017-08-29 DIAGNOSIS — M170 Bilateral primary osteoarthritis of knee: Secondary | ICD-10-CM | POA: Insufficient documentation

## 2017-08-29 DIAGNOSIS — R600 Localized edema: Secondary | ICD-10-CM | POA: Diagnosis not present

## 2017-08-29 DIAGNOSIS — J309 Allergic rhinitis, unspecified: Secondary | ICD-10-CM | POA: Insufficient documentation

## 2017-08-29 DIAGNOSIS — J3089 Other allergic rhinitis: Secondary | ICD-10-CM | POA: Diagnosis not present

## 2017-08-29 DIAGNOSIS — M159 Polyosteoarthritis, unspecified: Secondary | ICD-10-CM

## 2017-08-29 DIAGNOSIS — H259 Unspecified age-related cataract: Secondary | ICD-10-CM | POA: Diagnosis not present

## 2017-08-29 DIAGNOSIS — E1159 Type 2 diabetes mellitus with other circulatory complications: Secondary | ICD-10-CM | POA: Insufficient documentation

## 2017-08-29 DIAGNOSIS — M150 Primary generalized (osteo)arthritis: Secondary | ICD-10-CM

## 2017-08-29 DIAGNOSIS — M1711 Unilateral primary osteoarthritis, right knee: Secondary | ICD-10-CM | POA: Insufficient documentation

## 2017-08-29 DIAGNOSIS — Z7689 Persons encountering health services in other specified circumstances: Secondary | ICD-10-CM

## 2017-08-29 NOTE — Assessment & Plan Note (Signed)
Slightly above goal today, but she reports that she is stressed out in pain We will recheck at follow-up visit We will continue Maxzide for now Check CMP

## 2017-08-29 NOTE — Assessment & Plan Note (Signed)
Per patient, this is somewhat well-controlled Discuss long-term consequences of PPI therapy Continue Nexium at current Discussed with patient that she should not take the pantoprazole that she has with the Nexium Discussed that we could add Carafate that she was previously prescribed, but she thinks she is well enough controlled she does not need this at this time

## 2017-08-29 NOTE — Assessment & Plan Note (Signed)
Significant bilateral lower extremity edema Suspect this is multifactorial, related to swelling of left knee, venous insufficiency related to obesity, immobility with sitting with legs hanging down frequently Surgeon for possible DVT though, considering her recent immobility and tenderness on exam We will get lower extremity venous duplexes bilaterally, but we will not get stat as patient has had this for about 6 weeks at this point Could consider repeat echo as last was in 2014 and she did have some diastolic dysfunction, but given that this is happened after her injury to her knee, suspect it is not a systemic issue Lungs are also clear with no crackles or signs of hypervolemia Return precautions discussed

## 2017-08-29 NOTE — Assessment & Plan Note (Signed)
Managed by orthopedics Discussed that this is unlikely to be related to her shingles vaccine She can continue Celebrex

## 2017-08-29 NOTE — Assessment & Plan Note (Addendum)
Followed by to [facility] ophthalmology

## 2017-08-29 NOTE — Patient Instructions (Signed)
Edema Edema is when you have too much fluid in your body or under your skin. Edema may make your legs, feet, and ankles swell up. Swelling is also common in looser tissues, like around your eyes. This is a common condition. It gets more common as you get older. There are many possible causes of edema. Eating too much salt (sodium) and being on your feet or sitting for a long time can cause edema in your legs, feet, and ankles. Hot weather may make edema worse. Edema is usually painless. Your skin may look swollen or shiny. Follow these instructions at home:  Keep the swollen body part raised (elevated) above the level of your heart when you are sitting or lying down.  Do not sit still or stand for a long time.  Do not wear tight clothes. Do not wear garters on your upper legs.  Exercise your legs. This can help the swelling go down.  Wear elastic bandages or support stockings as told by your doctor.  Eat a low-salt (low-sodium) diet to reduce fluid as told by your doctor.  Depending on the cause of your swelling, you may need to limit how much fluid you drink (fluid restriction).  Take over-the-counter and prescription medicines only as told by your doctor. Contact a doctor if:  Treatment is not working.  You have heart, liver, or kidney disease and have symptoms of edema.  You have sudden and unexplained weight gain. Get help right away if:  You have shortness of breath or chest pain.  You cannot breathe when you lie down.  You have pain, redness, or warmth in the swollen areas.  You have heart, liver, or kidney disease and get edema all of a sudden.  You have a fever and your symptoms get worse all of a sudden. Summary  Edema is when you have too much fluid in your body or under your skin.  Edema may make your legs, feet, and ankles swell up. Swelling is also common in looser tissues, like around your eyes.  Raise (elevate) the swollen body part above the level of your  heart when you are sitting or lying down.  Follow your doctor's instructions about diet and how much fluid you can drink (fluid restriction). This information is not intended to replace advice given to you by your health care provider. Make sure you discuss any questions you have with your health care provider. Document Released: 05/03/2008 Document Revised: 12/03/2016 Document Reviewed: 12/03/2016 Elsevier Interactive Patient Education  2017 Elsevier Inc.  

## 2017-08-29 NOTE — Assessment & Plan Note (Signed)
States it is fairly well controlled on Mirapex, which we will continue

## 2017-08-29 NOTE — Progress Notes (Signed)
Patient: Gloria Mercer Female    DOB: 05/03/1941   76 y.o.   MRN: [id number] Visit Date: 08/29/2017  Today's Provider: Lavon Paganini, MD   Chief Complaint  Patient presents with  . Establish Care  . Joint Pain   Subjective:    HPI   [name] presents to establish care. She is feeling poorly due to arthralgias.  She recently moved back to [facility] from [place] to be near family.  Joint/Muscle Pain: Patient complains of arthralgias for which has been present for several weeks. Pain is located in multiple joints, is described as aching and shooting, and is constant . - hurtingg primarily in both knees L>R.   - followed by [facility] (Dr. Mack Guise) - had MRI knee which showed significant OA - also with LBP with degenerative changes in L spine on MRI causing R hip and thigh pain - has long history of intermittent knee pain and back pain - had 1st Shingrix vaccine in early August and knee and hip pain flared up - wonders if she should even get the second vaccine or if it'll cause even worse knee pain  Macular degeneration: Followed by Dr Garwin Brothers at [facility].  He noted cataracts on last check up.  States these do not bother her and she is not currently interested in surgery  "Stomach issues:"  Started early in 2018.  States that stomach will hurt for several days.  Fever/chills occasionally, sometimes nauseated.  Most recently episode last Was seen by Dr. Benjamine Mola in [facility].  EGD, colonsocopy and CT showed no cause of abd pain Did have colon polyps on recent colonoscopy and was recommended to have repeat colonoscopy for screening purposes in 3 years. Results were reviewed and abstracted into the chart.  Allergic rhinitis: Taking claritin, singulair, nasacort nasal spray, atrovent nasal spray, and astelin prn, in addition to allergy shots - followed by Dr. Pryor Ochoa   HTN: taking Mazxide.  BP readings at home: not checking - having some swelling in  feet and legs for a few weeks - had a fall a few weeks ago that hurt her knee and wonders if this is the cause for her swelling - Denies any history of heart failure -Review of records reveals echo in 2014 with normal LVEF of 55-60% and ungraded diastolic dysfunction  States she was previously prescribed the following medications by previous PCP: protonix 40mg  daily, coreg 6.25mg  BID, amlodipine 2.5mg  daily, carafate 1g ACHS - she did not take any of them because she read the package inserts and was concerned about side effects of dizziness - she was scared because she had an episode of vertigo in 01/2017 that resolved  RLS: Taking mirapex - seems to help some -  Legs do hurt at night, but they hurt all day and she thinks is related to her knee and hip pain is of  GERD: - "Hasn't been too bad lately" - taking Nexium - states she will not go a day and not take a PPI - Was previously prescribed Carafate as above but never took any   Allergies  Allergen Reactions  . Erythromycin Other (See Comments)    Other reaction(s): Headache Headache and GI upset Headache and GI upset  . Losartan Nausea And Vomiting  . Metoprolol Nausea And Vomiting  . Simvastatin Other (See Comments)    Other reaction(s): Muscle Pain Pain lower extremities Pain lower extremities  . Dexlansoprazole Diarrhea  . Lisinopril Cough  .  Prednisone     Other reaction(s): Constipation GI upset     Current Outpatient Prescriptions:  .  aspirin 325 MG tablet, Take 325 mg by mouth daily., Disp: , Rfl:  .  azelastine (ASTELIN) 0.1 % nasal spray, Place 2 sprays into both nostrils daily., Disp: , Rfl:  .  calcium citrate-vitamin D (CITRACAL+D) 315-200 MG-UNIT tablet, Take by mouth., Disp: , Rfl:  .  celecoxib (CELEBREX) 200 MG capsule, Take 1 capsule by mouth daily., Disp: , Rfl:  .  Cholecalciferol (VITAMIN D3) 5000 units CAPS, Take 1 capsule by mouth daily., Disp: , Rfl:  .  clotrimazole-betamethasone (LOTRISONE)  cream, Apply 1 application topically 2 (two) times daily as needed., Disp: , Rfl: 1 .  esomeprazole (NEXIUM) 20 MG capsule, Take 20 mg by mouth daily at 12 noon., Disp: , Rfl:  .  ipratropium (ATROVENT) 0.03 % nasal spray, Place 2 sprays into both nostrils every 8 (eight) hours as needed., Disp: , Rfl: 12 .  loratadine (CLARITIN) 10 MG tablet, Take 2 tablets by mouth daily., Disp: , Rfl:  .  montelukast (SINGULAIR) 10 MG tablet, Take 1 tablet by mouth daily., Disp: , Rfl:  .  Multiple Vitamins-Minerals (OCUVITE PRESERVISION PO), Take 1 tablet by mouth daily., Disp: , Rfl:  .  Omega-3 Fatty Acids (FISH OIL) 1000 MG CAPS, Take 1 capsule by mouth daily., Disp: , Rfl:  .  pramipexole (MIRAPEX) 0.125 MG tablet, Take 1 tablet by mouth daily., Disp: , Rfl:  .  triamcinolone (NASACORT ALLERGY 24HR) 55 MCG/ACT AERO nasal inhaler, Place 2 sprays into the nose daily., Disp: , Rfl:  .  triamterene-hydrochlorothiazide (MAXZIDE-25) 37.5-25 MG tablet, Take 1 tablet by mouth daily., Disp: , Rfl:   Review of Systems  Constitutional: Positive for activity change, chills, fatigue and fever.  HENT: Positive for congestion, dental problem, drooling, mouth sores, postnasal drip, rhinorrhea, sinus pressure, sneezing and trouble swallowing.   Eyes: Positive for itching.  Respiratory: Positive for chest tightness, shortness of breath and wheezing.   Cardiovascular: Positive for leg swelling.  Gastrointestinal: Positive for abdominal distention, diarrhea and nausea.  Endocrine: Positive for polydipsia and polyuria.  Genitourinary: Positive for urgency.  Musculoskeletal: Positive for arthralgias, back pain, joint swelling and myalgias.  Skin: Positive for rash.  Allergic/Immunologic: Positive for environmental allergies.  Neurological: Positive for dizziness, weakness and numbness.  Hematological: Bruises/bleeds easily.  Psychiatric/Behavioral: Positive for decreased concentration and sleep disturbance.  All other  systems reviewed and are negative.    Past Medical History:  Diagnosis Date  . Allergic rhinitis    on allergy shots, sees Dr. Pryor Ochoa  . Cataract   . GERD (gastroesophageal reflux disease)   . Hypertension   . Macular degeneration    followed by Dr Garwin Brothers at [facility]  . Osteoarthritis     Past Surgical History:  Procedure Laterality Date  . CHOLECYSTECTOMY  2002  . TUBAL LIGATION  1978   Family History  Problem Relation Age of Onset  . Hypertension Mother   . Seizures Mother   . Heart disease Father 57       several heart attacks  . Diabetes Father        diet controlled  . Myelodysplastic syndrome Sister   . Prostate cancer Paternal Grandfather   . Breast cancer Neg Hx   . Cervical cancer Neg Hx     Social History  Substance Use Topics  . Smoking status: Former Smoker    Packs/day: 1.00    Years: 10.00  Types: Cigarettes    Quit date: 11/28/1976  . Smokeless tobacco: Never Used  . Alcohol use Yes     Comment: rarely, at holidays   Objective:   BP 136/90 (BP Location: Left Wrist, Patient Position: Sitting, Cuff Size: Normal)   Pulse 76   Temp (!) 97.3 F (36.3 C) (Oral)   Resp 16   Ht 5\' 1"  (1.549 m)   Wt 242 lb (109.8 kg)   BMI 45.73 kg/m  Vitals:   08/29/17 1420  BP: 136/90  Pulse: 76  Resp: 16  Temp: (!) 97.3 F (36.3 C)  TempSrc: Oral  Weight: 242 lb (109.8 kg)  Height: 5\' 1"  (1.549 m)     Physical Exam  Constitutional: She is oriented to person, place, and time. She appears well-developed and well-nourished. No distress.  HENT:  Head: Normocephalic and atraumatic.  Right Ear: External ear normal.  Left Ear: External ear normal.  Nose: Nose normal.  Mouth/Throat: Oropharynx is clear and moist.  Eyes: Conjunctivae are normal. No scleral icterus.  Neck: Neck supple. No thyromegaly present.  Cardiovascular: Normal rate, regular rhythm, normal heart sounds and intact distal pulses.   No murmur heard. Pulmonary/Chest: Effort normal and  breath sounds normal. No respiratory distress. She has no wheezes. She has no rales.  Abdominal: Soft. Bowel sounds are normal. She exhibits no distension. There is no tenderness. There is no rebound and no guarding.  Musculoskeletal:  2-3+ pitting edema bilaterally lower extremities below the knee - L>R Left knee with obvious swelling  Lymphadenopathy:    She has no cervical adenopathy.  Neurological: She is alert and oriented to person, place, and time.  Skin: Skin is warm and dry. No rash noted.  Psychiatric: She has a normal mood and affect. Her behavior is normal.  Vitals reviewed.       Assessment & Plan:      Problem List Items Addressed This Visit      Cardiovascular and Mediastinum   Hypertension    Slightly above goal today, but she reports that she is stressed out in pain We will recheck at follow-up visit We will continue Maxzide for now Check CMP      Relevant Medications   triamterene-hydrochlorothiazide (MAXZIDE-25) 37.5-25 MG tablet   aspirin 325 MG tablet   Other Relevant Orders   Comprehensive metabolic panel     Respiratory   Allergic rhinitis    Managed by ENT and on maximal medical therapy including allergy shots Continue Singulair, Claritin, Nasacort, Atrovent nasal spray, Astelin nasal spray        Digestive   GERD (gastroesophageal reflux disease)    Per patient, this is somewhat well-controlled Discuss long-term consequences of PPI therapy Continue Nexium at current Discussed with patient that she should not take the pantoprazole that she has with the Nexium Discussed that we could add Carafate that she was previously prescribed, but she thinks she is well enough controlled she does not need this at this time      Relevant Medications   esomeprazole (NEXIUM) 20 MG capsule   Other Relevant Orders   CBC w/Diff/Platelet     Musculoskeletal and Integument   Osteoarthritis    Managed by orthopedics Discussed that this is unlikely to be  related to her shingles vaccine She can continue Celebrex      Relevant Medications   celecoxib (CELEBREX) 200 MG capsule   aspirin 325 MG tablet     Other   Cataract    Followed  by to [facility] ophthalmology      Macular degeneration    Followed by [facility] ophthalmology      Restless leg syndrome    States it is fairly well controlled on Mirapex, which we will continue      Bilateral lower extremity edema    Significant bilateral lower extremity edema Suspect this is multifactorial, related to swelling of left knee, venous insufficiency related to obesity, immobility with sitting with legs hanging down frequently Surgeon for possible DVT though, considering her recent immobility and tenderness on exam We will get lower extremity venous duplexes bilaterally, but we will not get stat as patient has had this for about 6 weeks at this point Could consider repeat echo as last was in 2014 and she did have some diastolic dysfunction, but given that this is happened after her injury to her knee, suspect it is not a systemic issue Lungs are also clear with no crackles or signs of hypervolemia Return precautions discussed      Relevant Orders   TSH   CBC w/Diff/Platelet   Comprehensive metabolic panel   US Venous Img Lower Bilateral    Other Visit Diagnoses    Encounter to establish care    -  Primary   Encounter for immunization       Relevant Orders   Flu vaccine HIGH DOSE PF (Completed)          Return in about 6 weeks (around 10/10/2017) for chronic disease f/u.  Addressed extensive list of chronic and acute medical problems today requiring extensive time in counseling and coordination of care.  Over half of this 45 minute visit were spent in counseling and coordinating care of multiple medical problems.  The entirety of the information documented in the History of Present Illness, Review of Systems and Physical Exam were personally obtained by me. Portions of this information  were initially documented by Raquel Sarna Ratchford, CMA and reviewed by me for thoroughness and accuracy.    Lavon Paganini, MD  [facility]

## 2017-08-29 NOTE — Assessment & Plan Note (Signed)
Managed by ENT and on maximal medical therapy including allergy shots Continue Singulair, Claritin, Nasacort, Atrovent nasal spray, Astelin nasal spray

## 2017-08-29 NOTE — Assessment & Plan Note (Signed)
Followed by [facility] ophthalmology

## 2017-08-30 ENCOUNTER — Telehealth: Payer: Self-pay

## 2017-08-30 LAB — CBC WITH DIFFERENTIAL/PLATELET
BASOS PCT: 0.4 %
Basophils Absolute: 38 cells/uL (ref 0–200)
EOS PCT: 1.7 %
Eosinophils Absolute: 163 cells/uL (ref 15–500)
HEMATOCRIT: 37.3 % (ref 35.0–45.0)
HEMOGLOBIN: 12.6 g/dL (ref 11.7–15.5)
LYMPHS ABS: 2285 {cells}/uL (ref 850–3900)
MCH: 29.3 pg (ref 27.0–33.0)
MCHC: 33.8 g/dL (ref 32.0–36.0)
MCV: 86.7 fL (ref 80.0–100.0)
MPV: 9.6 fL (ref 7.5–12.5)
Monocytes Relative: 6.3 %
NEUTROS ABS: 6509 {cells}/uL (ref 1500–7800)
Neutrophils Relative %: 67.8 %
Platelets: 376 10*3/uL (ref 140–400)
RBC: 4.3 10*6/uL (ref 3.80–5.10)
RDW: 13 % (ref 11.0–15.0)
Total Lymphocyte: 23.8 %
WBC mixed population: 605 cells/uL (ref 200–950)
WBC: 9.6 10*3/uL (ref 3.8–10.8)

## 2017-08-30 LAB — COMPREHENSIVE METABOLIC PANEL
AG Ratio: 1.5 (calc) (ref 1.0–2.5)
ALT: 12 U/L (ref 6–29)
AST: 12 U/L (ref 10–35)
Albumin: 3.7 g/dL (ref 3.6–5.1)
Alkaline phosphatase (APISO): 84 U/L (ref 33–130)
BUN / CREAT RATIO: 19 (calc) (ref 6–22)
BUN: 20 mg/dL (ref 7–25)
CHLORIDE: 97 mmol/L — AB (ref 98–110)
CO2: 28 mmol/L (ref 20–32)
CREATININE: 1.03 mg/dL — AB (ref 0.60–0.93)
Calcium: 9.7 mg/dL (ref 8.6–10.4)
GLOBULIN: 2.5 g/dL (ref 1.9–3.7)
GLUCOSE: 110 mg/dL (ref 65–139)
Potassium: 4.2 mmol/L (ref 3.5–5.3)
Sodium: 134 mmol/L — ABNORMAL LOW (ref 135–146)
TOTAL PROTEIN: 6.2 g/dL (ref 6.1–8.1)
Total Bilirubin: 0.5 mg/dL (ref 0.2–1.2)

## 2017-08-30 LAB — TSH: TSH: 1.92 m[IU]/L (ref 0.40–4.50)

## 2017-08-30 NOTE — Telephone Encounter (Signed)
-----   Message from Virginia Crews, MD sent at 08/30/2017  9:13 AM EDT ----- Normal Thyroid function, Blood counts, kidney function (stbale from last check), liver function, electrolytes.  Virginia Crews, MD, MPH [facility] 08/30/2017 9:13 AM

## 2017-08-30 NOTE — Telephone Encounter (Signed)
Pt advised.

## 2017-09-02 ENCOUNTER — Ambulatory Visit
Admission: RE | Admit: 2017-09-02 | Discharge: 2017-09-02 | Disposition: A | Discharge status: Home / Self Care | Payer: Medicare Other | Source: Ambulatory Visit | Attending: Family Medicine | Admitting: Family Medicine

## 2017-09-02 ENCOUNTER — Telehealth: Payer: Self-pay

## 2017-09-02 DIAGNOSIS — M7122 Synovial cyst of popliteal space [Baker], left knee: Secondary | ICD-10-CM | POA: Insufficient documentation

## 2017-09-02 DIAGNOSIS — M7989 Other specified soft tissue disorders: Secondary | ICD-10-CM | POA: Insufficient documentation

## 2017-09-02 DIAGNOSIS — R600 Localized edema: Secondary | ICD-10-CM | POA: Diagnosis not present

## 2017-09-02 NOTE — Telephone Encounter (Signed)
Pt advised.

## 2017-09-02 NOTE — Telephone Encounter (Signed)
-----   Message from Virginia Crews, MD sent at 09/02/2017  3:33 PM EDT ----- No DVT (blood clot).  There is a small Baker's cyst noted in the L knee incidentally.  These can pop up with knee arthritis.  This is not causing the leg swelling, though.  No change in management.  Virginia Crews, MD, MPH [facility] 09/02/2017 3:33 PM

## 2017-09-06 DIAGNOSIS — M6281 Muscle weakness (generalized): Secondary | ICD-10-CM | POA: Diagnosis not present

## 2017-09-06 DIAGNOSIS — R609 Edema, unspecified: Secondary | ICD-10-CM | POA: Diagnosis not present

## 2017-09-06 DIAGNOSIS — M25562 Pain in left knee: Secondary | ICD-10-CM | POA: Diagnosis not present

## 2017-09-06 DIAGNOSIS — M25662 Stiffness of left knee, not elsewhere classified: Secondary | ICD-10-CM | POA: Diagnosis not present

## 2017-09-15 DIAGNOSIS — M25562 Pain in left knee: Secondary | ICD-10-CM | POA: Diagnosis not present

## 2017-09-15 DIAGNOSIS — R609 Edema, unspecified: Secondary | ICD-10-CM | POA: Diagnosis not present

## 2017-09-15 DIAGNOSIS — M25662 Stiffness of left knee, not elsewhere classified: Secondary | ICD-10-CM | POA: Diagnosis not present

## 2017-09-15 DIAGNOSIS — J301 Allergic rhinitis due to pollen: Secondary | ICD-10-CM | POA: Diagnosis not present

## 2017-09-15 DIAGNOSIS — M6281 Muscle weakness (generalized): Secondary | ICD-10-CM | POA: Diagnosis not present

## 2017-09-19 ENCOUNTER — Ambulatory Visit (INDEPENDENT_AMBULATORY_CARE_PROVIDER_SITE_OTHER): Payer: Medicare Other | Admitting: Podiatry

## 2017-09-19 ENCOUNTER — Encounter: Payer: Self-pay | Admitting: Podiatry

## 2017-09-19 VITALS — BP 161/82 | HR 74 | Resp 16

## 2017-09-19 DIAGNOSIS — B351 Tinea unguium: Secondary | ICD-10-CM | POA: Diagnosis not present

## 2017-09-19 DIAGNOSIS — J301 Allergic rhinitis due to pollen: Secondary | ICD-10-CM | POA: Diagnosis not present

## 2017-09-19 DIAGNOSIS — M79676 Pain in unspecified toe(s): Secondary | ICD-10-CM

## 2017-09-19 NOTE — Progress Notes (Signed)
Subjective:  Patient ID: Gloria Mercer, female    DOB: 1941/09/12,  MRN: [id number] HPI Chief Complaint  Patient presents with  . Toe Pain    Hallux left - patient states toe has been hurting off and on for few weeks, thought it was ingrown, not hurting as bad now, but would like to nails cut too    76 y.o. female presents with the above complaint.     Past Medical History:  Diagnosis Date  . Allergic rhinitis    on allergy shots, sees Dr. Pryor Ochoa  . Cataract   . GERD (gastroesophageal reflux disease)   . Hypertension   . Macular degeneration    followed by Dr Garwin Brothers at [facility]  . Osteoarthritis    Past Surgical History:  Procedure Laterality Date  . CHOLECYSTECTOMY  2002  . TUBAL LIGATION  1978    Current Outpatient Prescriptions:  .  aspirin 325 MG tablet, Take 325 mg by mouth daily., Disp: , Rfl:  .  azelastine (ASTELIN) 0.1 % nasal spray, Place 2 sprays into both nostrils daily., Disp: , Rfl:  .  calcium citrate-vitamin D (CITRACAL+D) 315-200 MG-UNIT tablet, Take by mouth., Disp: , Rfl:  .  celecoxib (CELEBREX) 200 MG capsule, Take 1 capsule by mouth daily., Disp: , Rfl:  .  Cholecalciferol (VITAMIN D3) 5000 units CAPS, Take 1 capsule by mouth daily., Disp: , Rfl:  .  clotrimazole-betamethasone (LOTRISONE) cream, Apply 1 application topically 2 (two) times daily as needed., Disp: , Rfl: 1 .  esomeprazole (NEXIUM) 20 MG capsule, Take 20 mg by mouth daily at 12 noon., Disp: , Rfl:  .  ipratropium (ATROVENT) 0.03 % nasal spray, Place 2 sprays into both nostrils every 8 (eight) hours as needed., Disp: , Rfl: 12 .  loratadine (CLARITIN) 10 MG tablet, Take 2 tablets by mouth daily., Disp: , Rfl:  .  montelukast (SINGULAIR) 10 MG tablet, Take 1 tablet by mouth daily., Disp: , Rfl:  .  Multiple Vitamins-Minerals (OCUVITE PRESERVISION PO), Take 1 tablet by mouth daily., Disp: , Rfl:  .  Omega-3 Fatty Acids (FISH OIL) 1000 MG CAPS, Take 1 capsule by mouth daily., Disp: , Rfl:    .  pramipexole (MIRAPEX) 0.125 MG tablet, Take 1 tablet by mouth daily., Disp: , Rfl:  .  triamcinolone (NASACORT ALLERGY 24HR) 55 MCG/ACT AERO nasal inhaler, Place 2 sprays into the nose daily., Disp: , Rfl:  .  triamterene-hydrochlorothiazide (MAXZIDE-25) 37.5-25 MG tablet, Take 1 tablet by mouth daily., Disp: , Rfl:   Allergies  Allergen Reactions  . Erythromycin Other (See Comments)    Other reaction(s): Headache Headache and GI upset Headache and GI upset  . Losartan Nausea And Vomiting  . Metoprolol Nausea And Vomiting  . Simvastatin Other (See Comments)    Other reaction(s): Muscle Pain Pain lower extremities Pain lower extremities  . Dexlansoprazole Diarrhea  . Lisinopril Cough  . Prednisone     Other reaction(s): Constipation GI upset   Review of Systems  Constitutional: Positive for activity change.  HENT: Positive for sinus pressure and sneezing.   Eyes: Positive for itching.  Respiratory: Positive for cough, chest tightness and shortness of breath.   Gastrointestinal: Positive for abdominal distention, abdominal pain, diarrhea and nausea.  Musculoskeletal: Positive for arthralgias, back pain, gait problem and myalgias.  Skin: Positive for rash.  Neurological: Positive for numbness.  Hematological: Bruises/bleeds easily.  All other systems reviewed and are negative.  Objective:  There were no vitals filed for this visit.  General: Well developed, nourished, in no acute distress, alert and oriented x3   Dermatological: Skin is warm, dry and supple bilateral. Nails x 10 are well maintained; remaining integument appears unremarkable at this time. There are no open sores, no preulcerative lesions, no rash or signs of infection present.toenails are long and dystrophic.  Vascular: Dorsalis Pedis artery and Posterior Tibial artery pedal pulses are 2/4 bilateral with immedate capillary fill time. Pedal hair growth present. No varicosities and no lower extremity edema  present bilateral.   Neruologic: Grossly intact via light touch bilateral. Vibratory intact via tuning fork bilateral. Protective threshold with Semmes Wienstein monofilament intact to all pedal sites bilateral. Patellar and Achilles deep tendon reflexes 2+ bilateral. No Babinski or clonus noted bilateral.   Musculoskeletal: No gross boney pedal deformities bilateral. No pain, crepitus, or limitation noted with foot and ankle range of motion bilateral. Muscular strength 5/5 in all groups tested bilateral.  Gait: Unassisted, Nonantalgic.    Radiographs:  None taken  Assessment & Plan:   Assessment: nail dystrophy.  Plan: debrided toenails bilateral.     Max T. [place], [place]

## 2017-09-21 DIAGNOSIS — M25662 Stiffness of left knee, not elsewhere classified: Secondary | ICD-10-CM | POA: Diagnosis not present

## 2017-09-21 DIAGNOSIS — R609 Edema, unspecified: Secondary | ICD-10-CM | POA: Diagnosis not present

## 2017-09-21 DIAGNOSIS — M6281 Muscle weakness (generalized): Secondary | ICD-10-CM | POA: Diagnosis not present

## 2017-09-21 DIAGNOSIS — M25562 Pain in left knee: Secondary | ICD-10-CM | POA: Diagnosis not present

## 2017-09-26 DIAGNOSIS — M25562 Pain in left knee: Secondary | ICD-10-CM | POA: Diagnosis not present

## 2017-09-26 DIAGNOSIS — R609 Edema, unspecified: Secondary | ICD-10-CM | POA: Diagnosis not present

## 2017-09-26 DIAGNOSIS — J301 Allergic rhinitis due to pollen: Secondary | ICD-10-CM | POA: Diagnosis not present

## 2017-09-26 DIAGNOSIS — M25662 Stiffness of left knee, not elsewhere classified: Secondary | ICD-10-CM | POA: Diagnosis not present

## 2017-09-29 DIAGNOSIS — M1712 Unilateral primary osteoarthritis, left knee: Secondary | ICD-10-CM | POA: Diagnosis not present

## 2017-10-05 DIAGNOSIS — R600 Localized edema: Secondary | ICD-10-CM | POA: Diagnosis not present

## 2017-10-05 DIAGNOSIS — M25562 Pain in left knee: Secondary | ICD-10-CM | POA: Diagnosis not present

## 2017-10-05 DIAGNOSIS — M25662 Stiffness of left knee, not elsewhere classified: Secondary | ICD-10-CM | POA: Diagnosis not present

## 2017-10-06 DIAGNOSIS — M1712 Unilateral primary osteoarthritis, left knee: Secondary | ICD-10-CM | POA: Diagnosis not present

## 2017-10-06 DIAGNOSIS — J301 Allergic rhinitis due to pollen: Secondary | ICD-10-CM | POA: Diagnosis not present

## 2017-10-07 DIAGNOSIS — M25562 Pain in left knee: Secondary | ICD-10-CM | POA: Diagnosis not present

## 2017-10-07 DIAGNOSIS — M25662 Stiffness of left knee, not elsewhere classified: Secondary | ICD-10-CM | POA: Diagnosis not present

## 2017-10-10 ENCOUNTER — Ambulatory Visit: Payer: Self-pay | Admitting: Family Medicine

## 2017-10-12 ENCOUNTER — Ambulatory Visit: Payer: Medicare Other | Admitting: Family Medicine

## 2017-10-13 DIAGNOSIS — M1712 Unilateral primary osteoarthritis, left knee: Secondary | ICD-10-CM | POA: Diagnosis not present

## 2017-10-19 DIAGNOSIS — J301 Allergic rhinitis due to pollen: Secondary | ICD-10-CM | POA: Diagnosis not present

## 2017-10-26 IMAGING — US US EXTREM LOW VENOUS BILAT
1 series · 13 of 24 positions shown · non-contrast
Comparison: None.

CLINICAL DATA: Bilateral lower extremity pain and edema, left
greater than right. History of fall 2 weeks ago. Evaluate for DVT.



[Series 1: us extrem low venous bilat · 0.09mm/px · 13 of 70 slices shown]
[im 1/70]
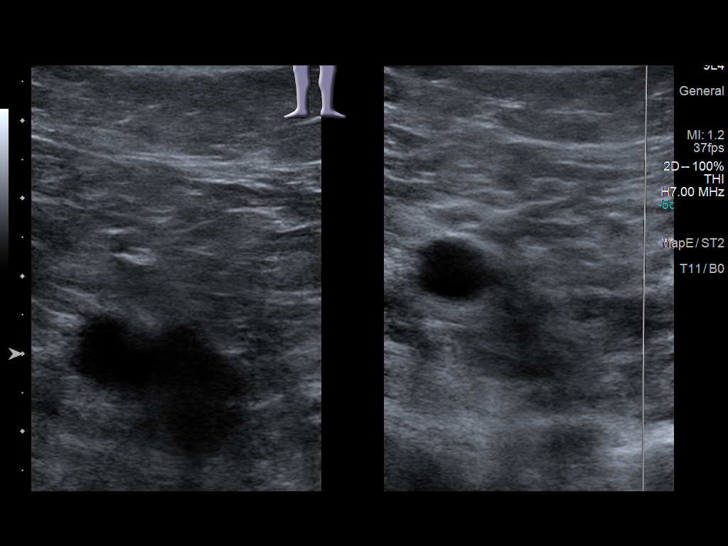
[im 7/70]
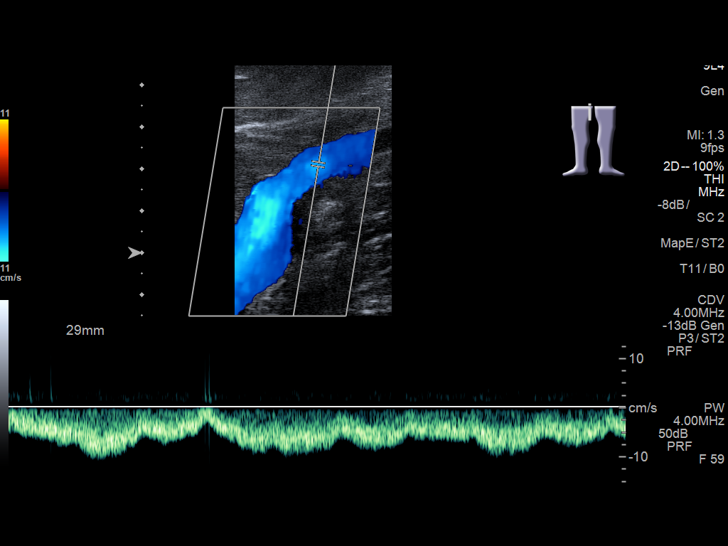
[im 13/70]
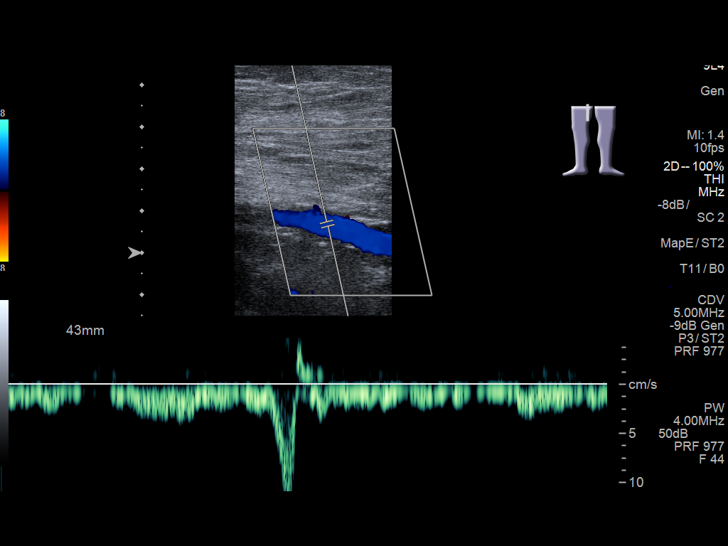
[im 19/70]
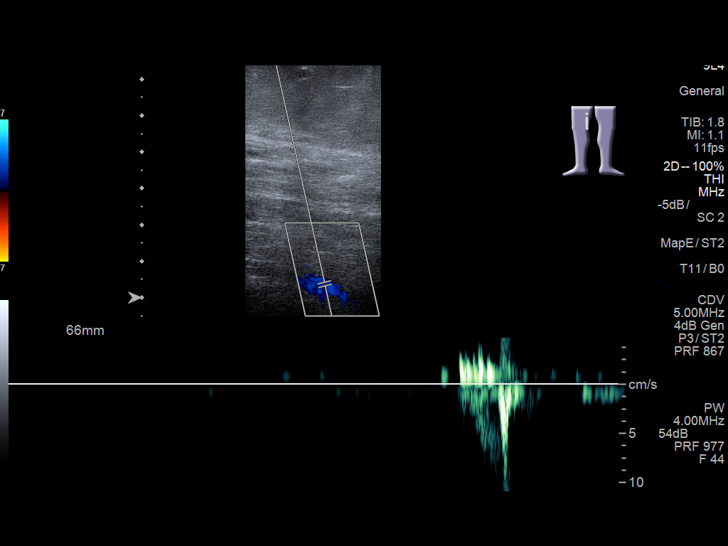
[im 25/70]
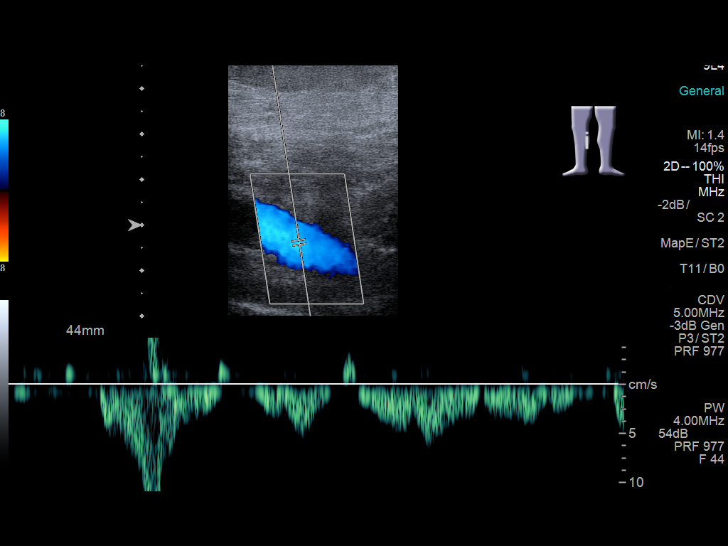
[im 31/70]
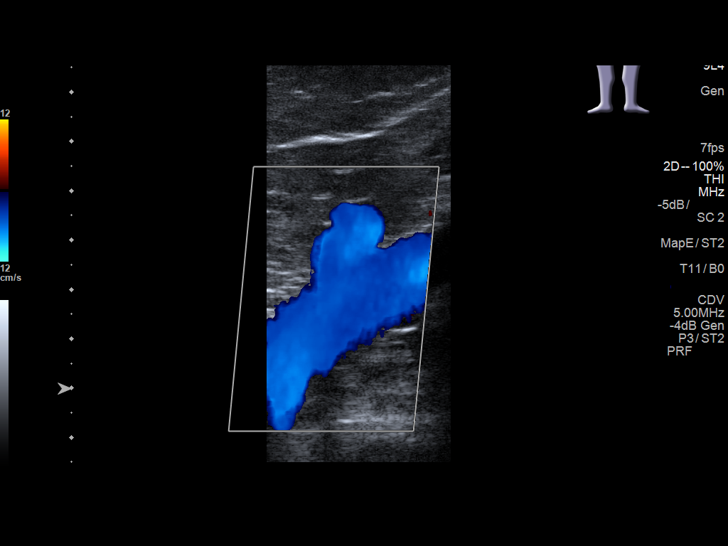
[im 37/70]
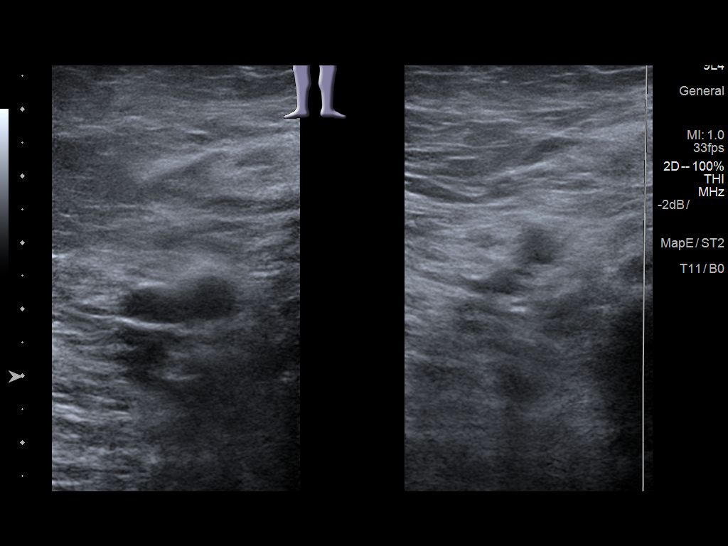
[im 40/70]
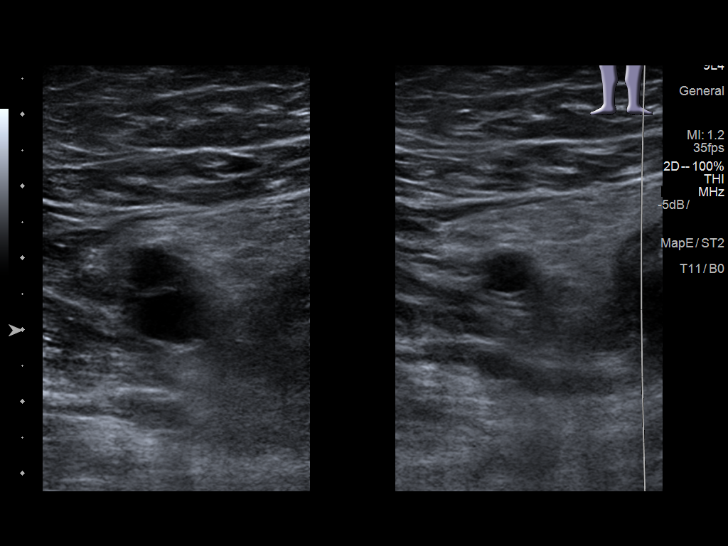
[im 46/70]
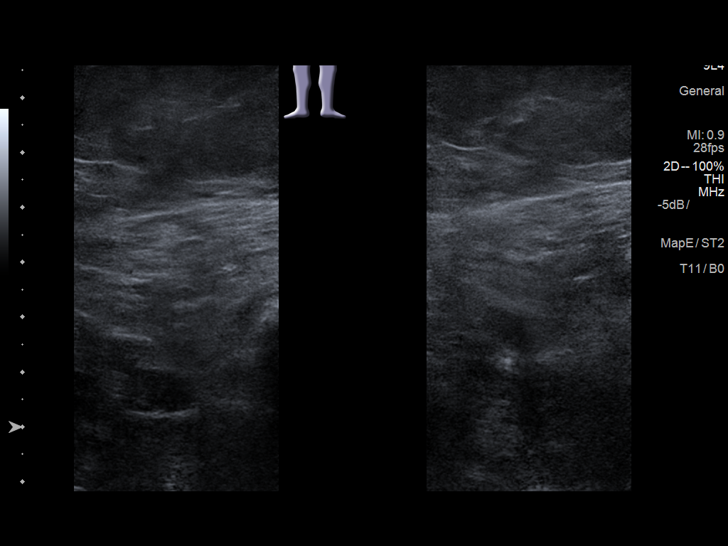
[im 52/70]
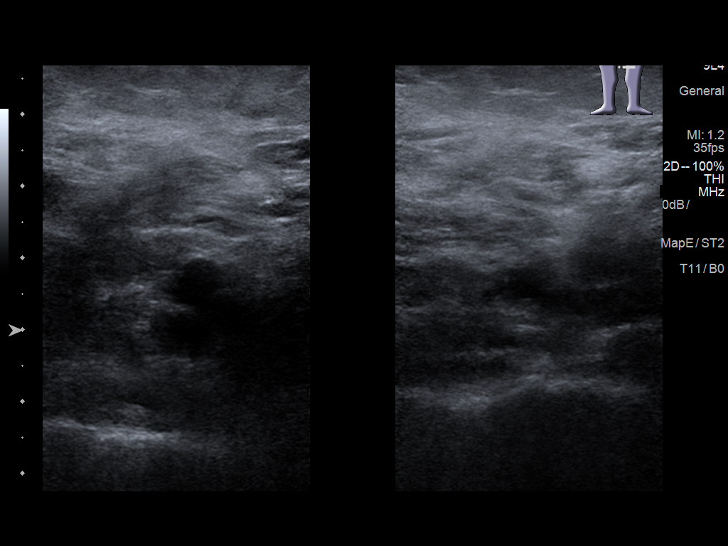
[im 58/70]
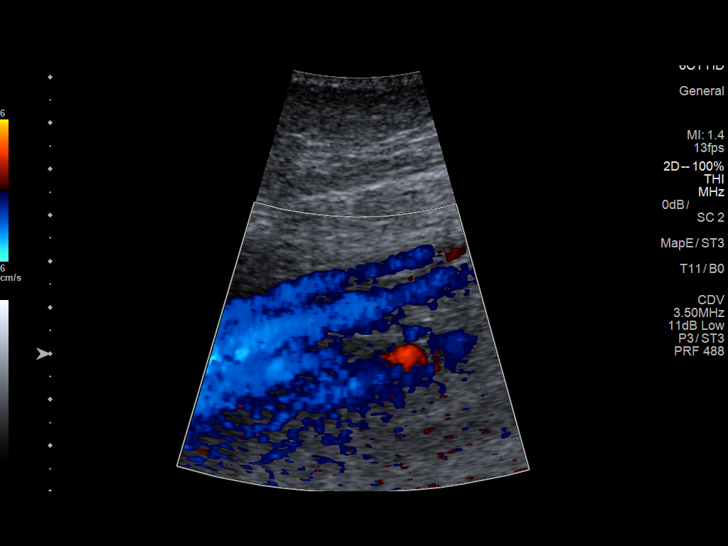
[im 64/70]
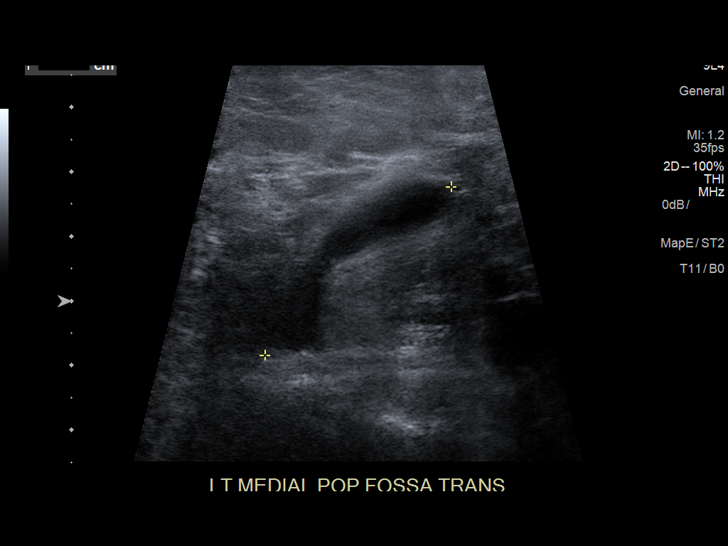
[im 70/70]
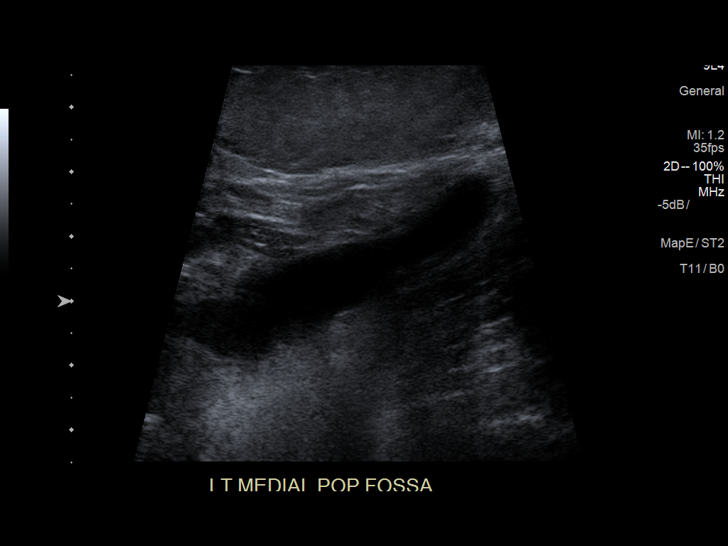

[13 of 24 positions shown; findings below may reference images not displayed]

FINDINGS: RIGHT LOWER EXTREMITY

Common Femoral Vein: No evidence of thrombus. Normal
compressibility, respiratory phasicity and response to augmentation.

Saphenofemoral Junction: No evidence of thrombus. Normal
compressibility and flow on color Doppler imaging.

Profunda Femoral Vein: No evidence of thrombus. Normal
compressibility and flow on color Doppler imaging.

Femoral Vein: No evidence of thrombus. Normal compressibility,
respiratory phasicity and response to augmentation.

Popliteal Vein: No evidence of thrombus. Normal compressibility,
respiratory phasicity and response to augmentation.

Calf Veins: No evidence of thrombus. Normal compressibility and flow
on color Doppler imaging.

Superficial Great Saphenous Vein: No evidence of thrombus. Normal
compressibility and flow on color Doppler imaging.

Venous Reflux:  None.

Other Findings:  None.

LEFT LOWER EXTREMITY

Common Femoral Vein: No evidence of thrombus. Normal
compressibility, respiratory phasicity and response to augmentation.

Saphenofemoral Junction: No evidence of thrombus. Normal
compressibility and flow on color Doppler imaging.

Profunda Femoral Vein: No evidence of thrombus. Normal
compressibility and flow on color Doppler imaging.

Femoral Vein: No evidence of thrombus. Normal compressibility,
respiratory phasicity and response to augmentation.

Popliteal Vein: No evidence of thrombus. Normal compressibility,
respiratory phasicity and response to augmentation.

Calf Veins: No evidence of thrombus. Normal compressibility and flow
on color Doppler imaging.

Superficial Great Saphenous Vein: No evidence of thrombus. Normal
compressibility and flow on color Doppler imaging.

Venous Reflux:  None.

Other Findings: Note is made of a serpiginous approximately 6.0 x
1.1 x 3.9 cm fluid collection within the left popliteal fossa
favored to represent a Baker cyst.
IMPRESSION: 1. No evidence of DVT within either lower extremity.
2. Incidentally noted approximately 6.0 cm left-sided Baker cyst.

## 2017-11-17 DIAGNOSIS — J301 Allergic rhinitis due to pollen: Secondary | ICD-10-CM | POA: Diagnosis not present

## 2017-11-24 DIAGNOSIS — J301 Allergic rhinitis due to pollen: Secondary | ICD-10-CM | POA: Diagnosis not present

## 2017-12-15 ENCOUNTER — Telehealth: Payer: Self-pay

## 2017-12-15 NOTE — Telephone Encounter (Signed)
Patient called saying that her bowels are "blocked". She reports that she has not had a BM in 2 days. She reports that she usually goes regular and at least 2-3 times a day. She denies any changes in her diet. She denies nausea or vomiting. She does have abdominal pain, and describes it as a cramping sensation. She has not taken anything for her symptoms. She reports that she has miralax, but it is expired.   I advised Dr. B of above, and she recommends that patient take a capful of miralax 3x today along with juice or water. Advised patient to call if symptoms worsen or does not improve. Patient verbally understands, and agrees to treatment plan.

## 2017-12-26 DIAGNOSIS — J301 Allergic rhinitis due to pollen: Secondary | ICD-10-CM | POA: Diagnosis not present

## 2017-12-28 ENCOUNTER — Ambulatory Visit: Payer: Self-pay | Admitting: Family Medicine

## 2018-01-05 DIAGNOSIS — J301 Allergic rhinitis due to pollen: Secondary | ICD-10-CM | POA: Diagnosis not present

## 2018-01-09 ENCOUNTER — Telehealth: Payer: Self-pay | Admitting: Family Medicine

## 2018-01-09 NOTE — Telephone Encounter (Signed)
Patient states that she spoke with someone here a couple of weeks ago about bowel problems.  She states that the nurse advised her to take miralax with water or juice.  She is wanting to know how often she can take this and if there is a limit to how many times she can take this.  She took it three times yesterday afternoon and last night and "has had no luck yet."

## 2018-01-09 NOTE — Telephone Encounter (Signed)
Please review

## 2018-01-09 NOTE — Telephone Encounter (Signed)
lmtcb

## 2018-01-09 NOTE — Telephone Encounter (Signed)
The idea with miralax is to take it regularly to stay regular and avoid constipation.  Did advise a few weeks ago to take 3x that day to initiate clean out and then continue once daily.  There is not really a limit to what she can take, but it can lead to diarrhea in large quantities.  It can be used for cleanouts before colonoscopies.  If she is having any abd pain, N/V, fever, blood in BMs, she will need to be evaluated.  Would recommend OV.  Virginia Crews, MD, MPH [facility] 01/09/2018 2:34 PM

## 2018-01-09 NOTE — Telephone Encounter (Signed)
Pt is returning call. Thanks TNP

## 2018-01-10 ENCOUNTER — Ambulatory Visit: Payer: Medicare Other | Admitting: Physician Assistant

## 2018-01-10 NOTE — Telephone Encounter (Signed)
Mickel Baas advised pt and made OV with Tawanna Sat same day. Pt called back stating she had a BM, denied black/tarry stool, bloody stool, abdominal pain, fever, N/V. Cancelled appointment. She has an OV scheduled for 01/12/2018, which she will discuss this problem further if needed.

## 2018-01-11 ENCOUNTER — Telehealth: Payer: Self-pay | Admitting: Family Medicine

## 2018-01-12 ENCOUNTER — Encounter: Payer: Self-pay | Admitting: Family Medicine

## 2018-01-12 ENCOUNTER — Ambulatory Visit (INDEPENDENT_AMBULATORY_CARE_PROVIDER_SITE_OTHER): Payer: Medicare Other | Admitting: Family Medicine

## 2018-01-12 VITALS — BP 162/62 | HR 80 | Temp 97.9°F | Resp 16 | Wt 235.0 lb

## 2018-01-12 DIAGNOSIS — I10 Essential (primary) hypertension: Secondary | ICD-10-CM | POA: Diagnosis not present

## 2018-01-12 DIAGNOSIS — K5904 Chronic idiopathic constipation: Secondary | ICD-10-CM | POA: Diagnosis not present

## 2018-01-12 DIAGNOSIS — G2581 Restless legs syndrome: Secondary | ICD-10-CM

## 2018-01-12 DIAGNOSIS — J301 Allergic rhinitis due to pollen: Secondary | ICD-10-CM | POA: Diagnosis not present

## 2018-01-12 DIAGNOSIS — K5900 Constipation, unspecified: Secondary | ICD-10-CM | POA: Insufficient documentation

## 2018-01-12 DIAGNOSIS — K222 Esophageal obstruction: Secondary | ICD-10-CM

## 2018-01-12 DIAGNOSIS — K635 Polyp of colon: Secondary | ICD-10-CM | POA: Diagnosis not present

## 2018-01-12 MED ORDER — AMLODIPINE BESYLATE 5 MG PO TABS: 5.0000 mg | ORAL_TABLET | Freq: Every day | ORAL | 3 refills | Status: DC

## 2018-01-12 MED ORDER — CLOTRIMAZOLE-BETAMETHASONE 1-0.05 % EX CREA: 1.0000 "application " | TOPICAL_CREAM | Freq: Two times a day (BID) | CUTANEOUS | 1 refills | Status: DC | PRN

## 2018-01-12 MED ORDER — PRAMIPEXOLE DIHYDROCHLORIDE 0.125 MG PO TABS: 0.5000 mg | ORAL_TABLET | Freq: Every day | ORAL | 0 refills | Status: DC

## 2018-01-12 MED ORDER — TRIAMTERENE-HCTZ 37.5-25 MG PO TABS: 1.0000 | ORAL_TABLET | Freq: Every day | ORAL | 2 refills | Status: DC

## 2018-01-12 NOTE — Patient Instructions (Addendum)
Increase Mirapex to 2 pills nightly 2-3 hours before bedtime.  Each week, can increase by 1 more pill, until you reach 4 pills.  Call and let me know what dose you land on.   Constipation, Adult Constipation is when a person has fewer bowel movements in a week than normal, has difficulty having a bowel movement, or has stools that are dry, hard, or larger than normal. Constipation may be caused by an underlying condition. It may become worse with age if a person takes certain medicines and does not take in enough fluids. Follow these instructions at home: Eating and drinking   Eat foods that have a lot of fiber, such as fresh fruits and vegetables, whole grains, and beans.  Limit foods that are high in fat, low in fiber, or overly processed, such as french fries, hamburgers, cookies, candies, and soda.  Drink enough fluid to keep your urine clear or pale yellow. General instructions  Exercise regularly or as told by your health care provider.  Go to the restroom when you have the urge to go. Do not hold it in.  Take over-the-counter and prescription medicines only as told by your health care provider. These include any fiber supplements.  Practice pelvic floor retraining exercises, such as deep breathing while relaxing the lower abdomen and pelvic floor relaxation during bowel movements.  Watch your condition for any changes.  Keep all follow-up visits as told by your health care provider. This is important. Contact a health care provider if:  You have pain that gets worse.  You have a fever.  You do not have a bowel movement after 4 days.  You vomit.  You are not hungry.  You lose weight.  You are bleeding from the anus.  You have thin, pencil-like stools. Get help right away if:  You have a fever and your symptoms suddenly get worse.  You leak stool or have blood in your stool.  Your abdomen is bloated.  You have severe pain in your abdomen.  You feel dizzy or  you faint. This information is not intended to replace advice given to you by your health care provider. Make sure you discuss any questions you have with your health care provider. Document Released: 08/13/2004 Document Revised: 06/04/2016 Document Reviewed: 05/05/2016 Elsevier Interactive Patient Education  2018 [employer].  [facility] Content in Foods See the following list for the dietary fiber content of some common foods. High-fiber foods High-fiber foods contain 4 grams or more (4g or more) of fiber per serving. They include:  Artichoke (fresh) - 1 medium has 10.3g of fiber.  Baked beans, plain or vegetarian (canned) -  cup has 5.2g of fiber.  Blackberries or raspberries (fresh) -  cup has 4g of fiber.  Bran cereal -  cup has 8.6g of fiber.  Bulgur (cooked) -  cup has 4g of fiber.  Kidney beans (canned) -  cup has 6.8g of fiber.  Lentils (cooked) -  cup has 7.8g of fiber.  Pear (fresh) - 1 medium has 5.1g of fiber.  Peas (frozen) -  cup has 4.4g of fiber.  Pinto beans (canned) -  cup has 5.5g of fiber.  Pinto beans (dried and cooked) -  cup has 7.7g of fiber.  Potato with skin (baked) - 1 medium has 4.4g of fiber.  Quinoa (cooked) -  cup has 5g of fiber.  Soybeans (canned, frozen, or fresh) -  cup has 5.1g of fiber.  Moderate-fiber foods Moderate-fiber foods contain 1-4 grams (  1-4g) of fiber per serving. They include:  Almonds - 1 oz. has 3.5g of fiber.  Apple with skin - 1 medium has 3.3g of fiber.  Applesauce, sweetened -  cup has 1.5g of fiber.  Bagel, plain - one 4-inch (10-cm) bagel has 2g of fiber.  Banana - 1 medium has 3.1g of fiber.  Broccoli (cooked) -  cup has 2.5g of fiber.  Carrots (cooked) -  cup has 2.3g of fiber.  Corn (canned or frozen) -  cup has 2.1g of fiber.  Corn tortilla - one 6-inch (15-cm) tortilla has 1.5g of fiber.  Green beans (canned) -  cup has 2g of fiber.  Instant oatmeal -  cup has about 2g of  fiber.  Long-grain brown rice (cooked) - 1 cup has 3.5g of fiber.  Macaroni, enriched (cooked) - 1 cup has 2.5g of fiber.  Melon - 1 cup has 1.4g of fiber.  Multigrain cereal -  cup has about 2-4g of fiber.  Orange - 1 small has 3.1g of fiber.  Potatoes, mashed -  cup has 1.6g of fiber.  Raisins - 1/4 cup has 1.6g of fiber.  Squash -  cup has 2.9g of fiber.  Sunflower seeds -  cup has 1.1g of fiber.  Tomato - 1 medium has 1.5g of fiber.  Vegetable or soy patty - 1 has 3.4g of fiber.  Whole-wheat bread - 1 slice has 2g of fiber.  Whole-wheat spaghetti -  cup has 3.2g of fiber.  Low-fiber foods Low-fiber foods contain less than 1 gram (less than 1g) of fiber per serving. They include:  Egg - 1 large.  Flour tortilla - one 6-inch (15-cm) tortilla.  Fruit juice -  cup.  Lettuce - 1 cup.  Meat, poultry, or fish - 1 oz.  Milk - 1 cup.  Spinach (raw) - 1 cup.  White bread - 1 slice.  White rice -  cup.  Yogurt -  cup.  Actual amounts of fiber in foods may be different depending on processing. Talk with your dietitian about how much fiber you need in your diet. This information is not intended to replace advice given to you by your health care provider. Make sure you discuss any questions you have with your health care provider. Document Released: 04/03/2007 Document Revised: 04/22/2016 Document Reviewed: 01/08/2016 Elsevier Interactive Patient Education  2018 [employer].

## 2018-01-12 NOTE — Assessment & Plan Note (Signed)
See plan above Previously followed by GI, will refer back to GI

## 2018-01-12 NOTE — Assessment & Plan Note (Signed)
H/o colon polyps  Previously followed by gastroenterology and her previous location She is also being followed for Schatzki's rings of her distal esophagus, gastric polyps, lipomas in her colon, dysphagia Will refer to GI

## 2018-01-12 NOTE — Assessment & Plan Note (Signed)
Intermittent constipation chronically, worse in the last month States she was doing better when she is taking MiraLAX and knows she is not high-fiber foods Start daily MiraLAX supplement and Titrated to 1 soft bowel movement daily Can take 3-4 times daily as needed for acute worsening Advised on signs of obstruction and reasons to seek medical care Will also be seeing GI for other chronic GI issues

## 2018-01-12 NOTE — Assessment & Plan Note (Signed)
Uncontrolled, but on lowest possible dose of Mirapex We could consider Requip, but Mirapex dose We will increase dose by 0.125 mg each week to a dose of 0.5 mg Follow-up in 6 weeks and reevaluate at that time

## 2018-01-12 NOTE — Progress Notes (Signed)
Patient: Gloria Mercer Female    DOB: 09/18/1941   77 y.o.   MRN: [id number] Visit Date: 01/12/2018  Today's Provider: Lavon Paganini, MD   I, Martha Clan, CMA, am acting as scribe for Lavon Paganini, MD.  Chief Complaint  Patient presents with  . Hypertension   Subjective:    HPI  Pt would like to discuss fatigue today.     Hypertension, follow-up:  BP Readings from Last 3 Encounters:  01/12/18 (!) 162/62  09/19/17 (!) 161/82  08/29/17 136/90    She was last seen for hypertension 4 months ago.  BP at that visit was 136/90. Management since that visit includes continuing Maxzide. She reports good compliance with treatment. She is not having side effects.  She is not exercising. She is not adherent to low salt diet.   Outside blood pressures are not being checked. She is experiencing dyspnea, fatigue and lower extremity edema.  Patient denies chest pain, chest pressure/discomfort, claudication, exertional chest pressure/discomfort, irregular heart beat, near-syncope, orthopnea, palpitations and syncope.   Cardiovascular risk factors include advanced age (older than 50 for men, 57 for women), dyslipidemia, hypertension and obesity (BMI >= 30 kg/m2).  Use of agents associated with hypertension: NSAIDS.     Weight trend: decreasing steadily Wt Readings from Last 3 Encounters:  01/12/18 235 lb (106.6 kg)  08/29/17 242 lb (109.8 kg)    Current diet: well balanced, but could improve.  States this is elevated due to stress - her best friend is having an amputations - sister is very sick and in  ------------------------------------------------------------------------ Constipation This has been occurring for about 1 month. She was advised to take Miralax, with mild relief. Her normal BM's occur "several times per day", but now is going 2-3 days without having a BM. Denies black/tarry stools, bloody stools, vomiting (she is experiencing long-term nausea).  She states she experiencing chills with constipation and abd pain, but does not check her temperature. Is c/o generalized abdominal pain that is worsening over the last 2 months. She has not taken Miralax today, because she "feels yucky"; sx includes nausea, "pressure on my bowel area".  RLS Pt is currently taking Mirapex for RLS. She is requesting a refill, though she states this is losing it's effectiveness. She is taking 0.125 mg po qhs. She is wondering if there are alternatives to this med.   Allergies  Allergen Reactions  . Erythromycin Other (See Comments)    Other reaction(s): Headache Headache and GI upset Headache and GI upset  . Losartan Nausea And Vomiting  . Metoprolol Nausea And Vomiting  . Simvastatin Other (See Comments)    Other reaction(s): Muscle Pain Pain lower extremities Pain lower extremities  . Dexlansoprazole Diarrhea  . Lisinopril Cough  . Prednisone     Other reaction(s): Constipation GI upset     Current Outpatient Medications:  .  azelastine (ASTELIN) 0.1 % nasal spray, Place 2 sprays into both nostrils daily., Disp: , Rfl:  .  calcium citrate-vitamin D (CITRACAL+D) 315-200 MG-UNIT tablet, Take by mouth., Disp: , Rfl:  .  celecoxib (CELEBREX) 200 MG capsule, Take 1 capsule by mouth daily., Disp: , Rfl:  .  Cholecalciferol (VITAMIN D3) 5000 units CAPS, Take 1 capsule by mouth daily., Disp: , Rfl:  .  clotrimazole-betamethasone (LOTRISONE) cream, Apply 1 application topically 2 (two) times daily as needed., Disp: 30 g, Rfl: 1 .  esomeprazole (NEXIUM) 20 MG capsule, Take 20 mg by mouth daily at 12 noon., Disp: ,  Rfl:  .  ipratropium (ATROVENT) 0.03 % nasal spray, Place 2 sprays into both nostrils every 8 (eight) hours as needed., Disp: , Rfl: 12 .  loratadine (CLARITIN) 10 MG tablet, Take 2 tablets by mouth daily., Disp: , Rfl:  .  montelukast (SINGULAIR) 10 MG tablet, Take 1 tablet by mouth daily., Disp: , Rfl:  .  Multiple Vitamins-Minerals (OCUVITE  PRESERVISION PO), Take 1 tablet by mouth daily., Disp: , Rfl:  .  Omega-3 Fatty Acids (FISH OIL) 1000 MG CAPS, Take 1 capsule by mouth daily., Disp: , Rfl:  .  ondansetron (ZOFRAN) 4 MG tablet, Take 4 mg by mouth every 8 (eight) hours as needed for nausea or vomiting., Disp: , Rfl:  .  pramipexole (MIRAPEX) 0.125 MG tablet, Take 4 tablets (0.5 mg total) by mouth at bedtime., Disp: 120 tablet, Rfl: 0 .  traMADol (ULTRAM) 50 MG tablet, Take by mouth every 6 (six) hours as needed., Disp: , Rfl:  .  triamcinolone (NASACORT ALLERGY 24HR) 55 MCG/ACT AERO nasal inhaler, Place 2 sprays into the nose daily., Disp: , Rfl:  .  triamterene-hydrochlorothiazide (MAXZIDE-25) 37.5-25 MG tablet, Take 1 tablet by mouth daily., Disp: 90 tablet, Rfl: 2 .  amLODipine (NORVASC) 5 MG tablet, Take 1 tablet (5 mg total) by mouth daily., Disp: 30 tablet, Rfl: 3 .  aspirin 325 MG tablet, Take 325 mg by mouth daily., Disp: , Rfl:   Review of Systems  Constitutional: Positive for chills and fatigue. Negative for activity change, appetite change, diaphoresis, fever and unexpected weight change.  Respiratory: Positive for shortness of breath (due to allergies).   Cardiovascular: Positive for leg swelling. Negative for chest pain and palpitations.  Gastrointestinal: Positive for abdominal distention, abdominal pain, constipation and nausea. Negative for blood in stool, diarrhea and vomiting.  Allergic/Immunologic: Positive for environmental allergies.    Social History   Tobacco Use  . Smoking status: Former Smoker    Packs/day: 1.00    Years: 10.00    Pack years: 10.00    Types: Cigarettes    Last attempt to quit: 11/28/1976    Years since quitting: 41.1  . Smokeless tobacco: Never Used  Substance Use Topics  . Alcohol use: Yes    Comment: rarely, at holidays   Objective:   BP (!) 162/62 (BP Location: Left Wrist, Patient Position: Sitting, Cuff Size: Large)   Pulse 80   Temp 97.9 F (36.6 C) (Oral)   Resp 16    Wt 235 lb (106.6 kg)   SpO2 98%   BMI 44.40 kg/m  Vitals:   01/12/18 1105  BP: (!) 162/62  Pulse: 80  Resp: 16  Temp: 97.9 F (36.6 C)  TempSrc: Oral  SpO2: 98%  Weight: 235 lb (106.6 kg)     Physical Exam  Constitutional: She is oriented to person, place, and time. She appears well-developed and well-nourished. No distress.  HENT:  Head: Normocephalic and atraumatic.  Eyes: Conjunctivae are normal. No scleral icterus.  Neck: Neck supple. No thyromegaly present.  Cardiovascular: Normal rate, regular rhythm, normal heart sounds and intact distal pulses.  No murmur heard. Pulmonary/Chest: Effort normal and breath sounds normal. No respiratory distress. She has no wheezes. She has no rales.  Abdominal: Soft. Bowel sounds are normal. She exhibits no distension. There is no tenderness.  Musculoskeletal: She exhibits no edema or deformity.  Lymphadenopathy:    She has no cervical adenopathy.  Neurological: She is alert and oriented to person, place, and time.  Skin: Skin  is warm. No rash noted.  Psychiatric: She has a normal mood and affect. Her behavior is normal.  Vitals reviewed.       Assessment & Plan:      Problem List Items Addressed This Visit      Cardiovascular and Mediastinum   Essential hypertension - Primary    Uncontrolled Maxzide currently on max dose, will continue Add amlodipine 5 mg daily Discussed potential side effects of edema Follow-up in 6 weeks      Relevant Medications   triamterene-hydrochlorothiazide (MAXZIDE-25) 37.5-25 MG tablet   amLODipine (NORVASC) 5 MG tablet     Digestive   Colon polyps    H/o colon polyps  Previously followed by gastroenterology and her previous location She is also being followed for Schatzki's rings of her distal esophagus, gastric polyps, lipomas in her colon, dysphagia Will refer to GI      Relevant Orders   Ambulatory referral to Gastroenterology   Schatzki's ring of distal esophagus    See plan  above Previously followed by GI, will refer back to GI      Relevant Orders   Ambulatory referral to Gastroenterology     Other   RLS (restless legs syndrome)    Uncontrolled, but on lowest possible dose of Mirapex We could consider Requip, but Mirapex dose We will increase dose by 0.125 mg each week to a dose of 0.5 mg Follow-up in 6 weeks and reevaluate at that time      Constipation    Intermittent constipation chronically, worse in the last month States she was doing better when she is taking MiraLAX and knows she is not high-fiber foods Start daily MiraLAX supplement and Titrated to 1 soft bowel movement daily Can take 3-4 times daily as needed for acute worsening Advised on signs of obstruction and reasons to seek medical care Will also be seeing GI for other chronic GI issues      Relevant Orders   Ambulatory referral to Gastroenterology       Return in about 6 weeks (around 02/23/2018) for BP, constipation, RLS f/u.   The entirety of the information documented in the History of Present Illness, Review of Systems and Physical Exam were personally obtained by me. Portions of this information were initially documented by Raquel Sarna Ratchford, CMA and reviewed by me for thoroughness and accuracy.    Virginia Crews, MD, MPH [facility] 01/12/2018 4:47 PM

## 2018-01-12 NOTE — Assessment & Plan Note (Signed)
Uncontrolled Maxzide currently on max dose, will continue Add amlodipine 5 mg daily Discussed potential side effects of edema Follow-up in 6 weeks

## 2018-01-13 ENCOUNTER — Encounter: Payer: Self-pay | Admitting: *Deleted

## 2018-01-23 DIAGNOSIS — J301 Allergic rhinitis due to pollen: Secondary | ICD-10-CM | POA: Diagnosis not present

## 2018-01-30 ENCOUNTER — Telehealth: Payer: Self-pay | Admitting: Family Medicine

## 2018-01-30 NOTE — Telephone Encounter (Signed)
Myself and [facility] have made attempts to contact pt without return call.Their office did send a letter to pt asking that she contact their  office.Colonoscopy has not been scheduled

## 2018-01-30 NOTE — Telephone Encounter (Signed)
Please review

## 2018-01-30 NOTE — Telephone Encounter (Signed)
Noted.  I will try to discuss at her upcoming f/u appt.  Virginia Crews, MD, MPH [facility] 01/30/2018 2:51 PM

## 2018-02-13 DIAGNOSIS — J301 Allergic rhinitis due to pollen: Secondary | ICD-10-CM | POA: Diagnosis not present

## 2018-02-16 DIAGNOSIS — D2372 Other benign neoplasm of skin of left lower limb, including hip: Secondary | ICD-10-CM | POA: Diagnosis not present

## 2018-02-16 DIAGNOSIS — D485 Neoplasm of uncertain behavior of skin: Secondary | ICD-10-CM | POA: Diagnosis not present

## 2018-02-16 DIAGNOSIS — L821 Other seborrheic keratosis: Secondary | ICD-10-CM | POA: Diagnosis not present

## 2018-02-20 DIAGNOSIS — J301 Allergic rhinitis due to pollen: Secondary | ICD-10-CM | POA: Diagnosis not present

## 2018-02-21 DIAGNOSIS — M1712 Unilateral primary osteoarthritis, left knee: Secondary | ICD-10-CM | POA: Diagnosis not present

## 2018-02-23 ENCOUNTER — Ambulatory Visit: Payer: Self-pay | Admitting: Family Medicine

## 2018-02-27 DIAGNOSIS — J301 Allergic rhinitis due to pollen: Secondary | ICD-10-CM | POA: Diagnosis not present

## 2018-02-28 ENCOUNTER — Telehealth: Payer: Self-pay

## 2018-02-28 NOTE — Telephone Encounter (Signed)
Called pt to schedule AWV with myself and CPE. Pt declined scheduling these apt at this time. FYI to PCP. -MM

## 2018-03-02 NOTE — Telephone Encounter (Signed)
complete

## 2018-03-03 DIAGNOSIS — H353122 Nonexudative age-related macular degeneration, left eye, intermediate dry stage: Secondary | ICD-10-CM | POA: Diagnosis not present

## 2018-03-03 DIAGNOSIS — H353212 Exudative age-related macular degeneration, right eye, with inactive choroidal neovascularization: Secondary | ICD-10-CM | POA: Diagnosis not present

## 2018-03-09 ENCOUNTER — Ambulatory Visit (INDEPENDENT_AMBULATORY_CARE_PROVIDER_SITE_OTHER): Payer: Medicare Other | Admitting: Family Medicine

## 2018-03-09 ENCOUNTER — Encounter: Payer: Self-pay | Admitting: Family Medicine

## 2018-03-09 VITALS — BP 112/62 | HR 97 | Resp 16 | Wt 227.0 lb

## 2018-03-09 DIAGNOSIS — M170 Bilateral primary osteoarthritis of knee: Secondary | ICD-10-CM | POA: Diagnosis not present

## 2018-03-09 DIAGNOSIS — G2581 Restless legs syndrome: Secondary | ICD-10-CM | POA: Diagnosis not present

## 2018-03-09 DIAGNOSIS — J301 Allergic rhinitis due to pollen: Secondary | ICD-10-CM | POA: Diagnosis not present

## 2018-03-09 DIAGNOSIS — I10 Essential (primary) hypertension: Secondary | ICD-10-CM

## 2018-03-09 MED ORDER — PRAMIPEXOLE DIHYDROCHLORIDE 0.125 MG PO TABS: 0.5000 mg | ORAL_TABLET | Freq: Every day | ORAL | 2 refills | Status: DC

## 2018-03-09 MED ORDER — TRAMADOL HCL 50 MG PO TABS: 50.0000 mg | ORAL_TABLET | Freq: Three times a day (TID) | ORAL | 2 refills | Status: DC | PRN

## 2018-03-09 NOTE — Progress Notes (Signed)
Patient: Gloria Mercer Female    DOB: Dec 26, 1940   77 y.o.   MRN: [id number] Visit Date: 03/09/2018  Today's Provider: Lavon Paganini, MD   Chief Complaint  Patient presents with  . Hypertension  . Restless Leg Syndrome  . Knee Pain   Subjective:    HPI      Hypertension, follow-up:  BP Readings from Last 3 Encounters:  03/09/18 112/62  01/12/18 (!) 162/62  09/19/17 (!) 161/82    She was last seen for hypertension 6 weeks ago.  BP at that visit was 162/62. Management since that visit includes adding amlodipine 5 mg. She reports good compliance with treatment. She is not having side effects.  She is not exercising due to arthralgias. She is adherent to low salt diet.   Outside blood pressures are normal per pt. She is experiencing dyspnea, fatigue and lower extremity edema.  Patient denies chest pain, chest pressure/discomfort, exertional chest pressure/discomfort, irregular heart beat, near-syncope, orthopnea, palpitations and syncope.   Cardiovascular risk factors include advanced age (older than 57 for men, 16 for women), dyslipidemia, hypertension and obesity (BMI >= 30 kg/m2).   Weight trend: decreasing steadily Wt Readings from Last 3 Encounters:  03/09/18 227 lb (103 kg)  01/12/18 235 lb (106.6 kg)  08/29/17 242 lb (109.8 kg)    Current diet: in general, a "healthy" diet    ------------------------------------------------------------------------  Follow up for RLS  The patient was last seen for this 6 weeks ago. Changes made at last visit include increasing Mirapex dose to 0.5 mg.  She reports good compliance with treatment. She is taking 0.25 mg of this medication. She feels that condition is Improved, but not to goal. She is not having side effects.   ------------------------------------------------------------------------------------ Knee Pain Pt saw an orthopedist, Dr. Merlene Morse, in [place] regarding the left knee pain.  He informed pt that he would not perform surgery on her until she has lost 25 pounds. She has lost 7 pounds so far. Dr. Lyndel Pleasure did advise pt that she could take up to 400 mg of Celebrex daily, which is twice as much as she is taking now. She would like to increase this, as her knee pain is worsening. She is requesting a new prescription.   She has tried Synvisc and corticosteroid injections from Orthopedist in [place] previously without relief.  Allergies  Allergen Reactions  . Erythromycin Other (See Comments)    Other reaction(s): Headache Headache and GI upset Headache and GI upset  . Losartan Nausea And Vomiting  . Metoprolol Nausea And Vomiting  . Simvastatin Other (See Comments)    Other reaction(s): Muscle Pain Pain lower extremities Pain lower extremities  . Dexlansoprazole Diarrhea  . Lisinopril Cough  . Prednisone     Other reaction(s): Constipation GI upset     Current Outpatient Medications:  .  amLODipine (NORVASC) 5 MG tablet, Take 1 tablet (5 mg total) by mouth daily., Disp: 30 tablet, Rfl: 3 .  aspirin 325 MG tablet, Take 325 mg by mouth 2 (two) times a week. , Disp: , Rfl:  .  azelastine (ASTELIN) 0.1 % nasal spray, Place 2 sprays into both nostrils daily., Disp: , Rfl:  .  calcium citrate-vitamin D (CITRACAL+D) 315-200 MG-UNIT tablet, Take by mouth., Disp: , Rfl:  .  celecoxib (CELEBREX) 200 MG capsule, Take 1 capsule by mouth daily., Disp: , Rfl:  .  Cholecalciferol (VITAMIN D3) 5000 units CAPS, Take 1 capsule by mouth daily., Disp: ,  Rfl:  .  clotrimazole-betamethasone (LOTRISONE) cream, Apply 1 application topically 2 (two) times daily as needed., Disp: 30 g, Rfl: 1 .  esomeprazole (NEXIUM) 20 MG capsule, Take 20 mg by mouth daily at 12 noon., Disp: , Rfl:  .  fluticasone (FLONASE) 50 MCG/ACT nasal spray, Place into both nostrils daily., Disp: , Rfl:  .  ipratropium (ATROVENT) 0.03 % nasal spray, Place 2 sprays into both nostrils every 8 (eight) hours as  needed., Disp: , Rfl: 12 .  loratadine (CLARITIN) 10 MG tablet, Take 2 tablets by mouth daily., Disp: , Rfl:  .  montelukast (SINGULAIR) 10 MG tablet, Take 1 tablet by mouth daily., Disp: , Rfl:  .  Multiple Vitamins-Minerals (OCUVITE PRESERVISION PO), Take 1 tablet by mouth daily., Disp: , Rfl:  .  Omega-3 Fatty Acids (FISH OIL) 1000 MG CAPS, Take 1 capsule by mouth daily., Disp: , Rfl:  .  pramipexole (MIRAPEX) 0.125 MG tablet, Take 4 tablets (0.5 mg total) by mouth at bedtime., Disp: 120 tablet, Rfl: 0 .  triamcinolone (NASACORT ALLERGY 24HR) 55 MCG/ACT AERO nasal inhaler, Place 2 sprays into the nose daily., Disp: , Rfl:  .  triamterene-hydrochlorothiazide (MAXZIDE-25) 37.5-25 MG tablet, Take 1 tablet by mouth daily., Disp: 90 tablet, Rfl: 2 .  ondansetron (ZOFRAN) 4 MG tablet, Take 4 mg by mouth every 8 (eight) hours as needed for nausea or vomiting., Disp: , Rfl:  .  traMADol (ULTRAM) 50 MG tablet, Take by mouth every 6 (six) hours as needed., Disp: , Rfl:   Review of Systems  Constitutional: Positive for fatigue. Negative for activity change, appetite change, chills, diaphoresis, fever and unexpected weight change.  HENT: Negative.   Respiratory: Positive for shortness of breath. Negative for apnea, cough, chest tightness and wheezing.   Cardiovascular: Positive for leg swelling. Negative for chest pain and palpitations.  Gastrointestinal: Negative for abdominal distention, abdominal pain, anal bleeding, blood in stool, constipation, diarrhea, nausea, rectal pain and vomiting.  Genitourinary: Negative.   Musculoskeletal: Positive for arthralgias, gait problem and joint swelling.  Skin: Negative.   Hematological: Negative.   Psychiatric/Behavioral: Negative.     Social History   Tobacco Use  . Smoking status: Former Smoker    Packs/day: 1.00    Years: 10.00    Pack years: 10.00    Types: Cigarettes    Last attempt to quit: 11/28/1976    Years since quitting: 41.3  . Smokeless  tobacco: Never Used  Substance Use Topics  . Alcohol use: Yes    Comment: rarely, at holidays   Objective:   BP 112/62 (BP Location: Left Wrist, Patient Position: Sitting, Cuff Size: Large)   Pulse 97   Resp 16   Wt 227 lb (103 kg)   SpO2 96%   BMI 42.89 kg/m  Vitals:   03/09/18 1542  BP: 112/62  Pulse: 97  Resp: 16  SpO2: 96%  Weight: 227 lb (103 kg)     Physical Exam  Constitutional: She is oriented to person, place, and time. She appears well-developed and well-nourished. No distress.  HENT:  Head: Normocephalic and atraumatic.  Eyes: Conjunctivae are normal. No scleral icterus.  Neck: Neck supple. No thyromegaly present.  Cardiovascular: Normal rate, regular rhythm, normal heart sounds and intact distal pulses.  No murmur heard. Pulmonary/Chest: Effort normal and breath sounds normal. No respiratory distress. She has no wheezes. She has no rales.  Musculoskeletal: She exhibits no edema.  Diffuse tenderness over L knee.  Lymphadenopathy:    She  has no cervical adenopathy.  Neurological: She is alert and oriented to person, place, and time.  Skin: Skin is warm and dry. Capillary refill takes less than 2 seconds. No rash noted.  Psychiatric: She has a normal mood and affect. Her behavior is normal.  Vitals reviewed.      Assessment & Plan:   Problem List Items Addressed This Visit      Cardiovascular and Mediastinum   Essential hypertension - Primary    Well controlled Continue amlodipine and Maxzide at current doses Recent labs reviewed        Musculoskeletal and Integument   Osteoarthritis of both knees    Managed by Ortho Can continue celebrex, but she is on max dose Trial of tramadol prn for further pain control Discussed risks from long term NSAID use      Relevant Medications   traMADol (ULTRAM) 50 MG tablet     Other   RLS (restless legs syndrome)    Improving, but remains uncontrolled Continue Mirapex Increase from 0.25 to 0.375mg  nightly  and can titrate to 0.5 mg qhs Re-eval at f/u and consider Requip if remains uncontrolled          Return in about 3 months (around 06/08/2018) for chronic disease f/u.   The entirety of the information documented in the History of Present Illness, Review of Systems and Physical Exam were personally obtained by me. Portions of this information were initially documented by Raquel Sarna Ratchford, CMA and reviewed by me for thoroughness and accuracy.    Virginia Crews, MD, MPH [facility] 03/10/2018 9:26 AM

## 2018-03-09 NOTE — Patient Instructions (Signed)

## 2018-03-10 NOTE — Assessment & Plan Note (Signed)
Managed by Ortho Can continue celebrex, but she is on max dose Trial of tramadol prn for further pain control Discussed risks from long term NSAID use

## 2018-03-10 NOTE — Assessment & Plan Note (Signed)
Improving, but remains uncontrolled Continue Mirapex Increase from 0.25 to 0.375mg  nightly and can titrate to 0.5 mg qhs Re-eval at f/u and consider Requip if remains uncontrolled

## 2018-03-10 NOTE — Assessment & Plan Note (Addendum)
Well controlled Continue amlodipine and Maxzide at current doses Recent labs reviewed

## 2018-03-16 DIAGNOSIS — J301 Allergic rhinitis due to pollen: Secondary | ICD-10-CM | POA: Diagnosis not present

## 2018-03-27 DIAGNOSIS — J301 Allergic rhinitis due to pollen: Secondary | ICD-10-CM | POA: Diagnosis not present

## 2018-04-10 ENCOUNTER — Other Ambulatory Visit: Payer: Self-pay | Admitting: Family Medicine

## 2018-04-11 ENCOUNTER — Telehealth: Payer: Self-pay

## 2018-04-11 MED ORDER — CELECOXIB 200 MG PO CAPS: 200.0000 mg | ORAL_CAPSULE | Freq: Every day | ORAL | 2 refills | Status: DC

## 2018-04-11 NOTE — Telephone Encounter (Signed)
Pt advised.

## 2018-04-11 NOTE — Telephone Encounter (Signed)
Pt states her tramadol is not covered, and will need a PA for this medication. But pt states this medication causes constipation, and is requesting an alternative medication if possible. She takes this for knee pain, and has taken Celebrex for this in the past, but was advised to D/C this due to BP issues. [facility] [address]. Please advise.

## 2018-04-11 NOTE — Telephone Encounter (Signed)
We discussed at last visit that she could continue celebrex, but could not increase dose.  Refill of celebrex sent to pharmacy.  This is not good to be on long term, but is fine for now.  Virginia Crews, MD, MPH [facility] 04/11/2018 9:59 AM

## 2018-04-17 DIAGNOSIS — J301 Allergic rhinitis due to pollen: Secondary | ICD-10-CM | POA: Diagnosis not present

## 2018-05-01 DIAGNOSIS — J301 Allergic rhinitis due to pollen: Secondary | ICD-10-CM | POA: Diagnosis not present

## 2018-05-08 DIAGNOSIS — J301 Allergic rhinitis due to pollen: Secondary | ICD-10-CM | POA: Diagnosis not present

## 2018-05-17 DIAGNOSIS — J301 Allergic rhinitis due to pollen: Secondary | ICD-10-CM | POA: Diagnosis not present

## 2018-05-17 DIAGNOSIS — Z5189 Encounter for other specified aftercare: Secondary | ICD-10-CM | POA: Diagnosis not present

## 2018-05-17 DIAGNOSIS — J302 Other seasonal allergic rhinitis: Secondary | ICD-10-CM | POA: Diagnosis not present

## 2018-05-25 DIAGNOSIS — J301 Allergic rhinitis due to pollen: Secondary | ICD-10-CM | POA: Diagnosis not present

## 2018-05-31 DIAGNOSIS — J301 Allergic rhinitis due to pollen: Secondary | ICD-10-CM | POA: Diagnosis not present

## 2018-06-08 DIAGNOSIS — J301 Allergic rhinitis due to pollen: Secondary | ICD-10-CM | POA: Diagnosis not present

## 2018-06-09 ENCOUNTER — Other Ambulatory Visit: Payer: Self-pay | Admitting: Family Medicine

## 2018-06-15 ENCOUNTER — Ambulatory Visit: Payer: Self-pay | Admitting: Family Medicine

## 2018-06-22 ENCOUNTER — Other Ambulatory Visit: Payer: Self-pay

## 2018-06-22 ENCOUNTER — Other Ambulatory Visit: Payer: Self-pay | Admitting: Family Medicine

## 2018-06-22 ENCOUNTER — Encounter: Payer: Self-pay | Admitting: Family Medicine

## 2018-06-22 ENCOUNTER — Ambulatory Visit (INDEPENDENT_AMBULATORY_CARE_PROVIDER_SITE_OTHER): Payer: Medicare Other | Admitting: Family Medicine

## 2018-06-22 VITALS — BP 120/84 | HR 78 | Temp 97.5°F | Resp 16 | Wt 224.0 lb

## 2018-06-22 DIAGNOSIS — R7303 Prediabetes: Secondary | ICD-10-CM | POA: Diagnosis not present

## 2018-06-22 DIAGNOSIS — B372 Candidiasis of skin and nail: Secondary | ICD-10-CM | POA: Diagnosis not present

## 2018-06-22 DIAGNOSIS — D510 Vitamin B12 deficiency anemia due to intrinsic factor deficiency: Secondary | ICD-10-CM

## 2018-06-22 DIAGNOSIS — G2581 Restless legs syndrome: Secondary | ICD-10-CM | POA: Diagnosis not present

## 2018-06-22 DIAGNOSIS — M170 Bilateral primary osteoarthritis of knee: Secondary | ICD-10-CM

## 2018-06-22 DIAGNOSIS — I10 Essential (primary) hypertension: Secondary | ICD-10-CM

## 2018-06-22 DIAGNOSIS — N183 Chronic kidney disease, stage 3 unspecified: Secondary | ICD-10-CM

## 2018-06-22 DIAGNOSIS — E559 Vitamin D deficiency, unspecified: Secondary | ICD-10-CM

## 2018-06-22 DIAGNOSIS — Z6841 Body Mass Index (BMI) 40.0 and over, adult: Secondary | ICD-10-CM | POA: Diagnosis not present

## 2018-06-22 DIAGNOSIS — J301 Allergic rhinitis due to pollen: Secondary | ICD-10-CM | POA: Diagnosis not present

## 2018-06-22 DIAGNOSIS — R600 Localized edema: Secondary | ICD-10-CM

## 2018-06-22 DIAGNOSIS — E782 Mixed hyperlipidemia: Secondary | ICD-10-CM | POA: Diagnosis not present

## 2018-06-22 MED ORDER — CLOTRIMAZOLE-BETAMETHASONE 1-0.05 % EX CREA: 1.0000 "application " | TOPICAL_CREAM | Freq: Two times a day (BID) | CUTANEOUS | 1 refills | Status: DC | PRN

## 2018-06-22 NOTE — Assessment & Plan Note (Signed)
Discussed diet and exercise She is trying to lose weight to be able to have total knee replacement

## 2018-06-22 NOTE — Assessment & Plan Note (Signed)
Well controlled Continue maxzide and amlodipien at current doses Discussed that amlodipine could be contributing to swelling, but patient is not interested in changing this at this time CMP checked with labs this AM F/u in 6 months

## 2018-06-22 NOTE — Assessment & Plan Note (Signed)
Managed by Ortho Can continue celebrex Discussed that this may be contributing to her swelling Unable to tolerate tramadol due to constipation Discussed risks of long-term NSAID use Cr checked with labs earlier today

## 2018-06-22 NOTE — Progress Notes (Signed)
Patient: Gloria Mercer Female    DOB: 08/04/1941   77 y.o.   MRN: [id number] Visit Date: 06/22/2018  Today's Provider: Lavon Paganini, MD   I, Martha Clan, CMA, am acting as scribe for Lavon Paganini, MD.  Chief Complaint  Patient presents with  . Hypertension  . RLS   Subjective:    HPI      Hypertension, follow-up:  BP Readings from Last 3 Encounters:  06/22/18 120/84  03/09/18 112/62  01/12/18 (!) 162/62    She was last seen for hypertension 3 months ago.  BP at that visit was 112/62. Management since that visit includes continuing medications. She reports good compliance with treatment. She is not having side effects.  She is not exercising. Due to OA bilateral knees. She is adherent to low salt diet.   Outside blood pressures are normal per pt.. She is experiencing fatigue and lower extremity edema.  Patient denies chest pain, chest pressure/discomfort, claudication, dyspnea, exertional chest pressure/discomfort, irregular heart beat, near-syncope, orthopnea, palpitations and syncope.   Cardiovascular risk factors include advanced age (older than 64 for men, 65 for women), dyslipidemia, hypertension, obesity (BMI >= 30 kg/m2) and sedentary lifestyle.  Use of agents associated with hypertension: NSAIDS and topical mineralocorticoids.     Weight trend: decreasing steadily Wt Readings from Last 3 Encounters:  06/22/18 224 lb (101.6 kg)  03/09/18 227 lb (103 kg)  01/12/18 235 lb (106.6 kg)    Current diet: in general, a "healthy" diet   Is working on losing weight so she gain get her TKR surgeries. ------------------------------------------------------------------------  Follow up for RLS  The patient was last seen for this 3 months ago. Changes made at last visit include increasing Mirapex (can take up to 0.5 mg qhs).  She reports excellent compliance with treatment. She is taking 0.25 mg po qhs. She feels that condition is  Improved. She is not having side effects.   ------------------------------------------------------------------------------------ Pt is currently taking Celebrex 200 mg po qhs for bilateral knee OA. She did try Tramadol for this, which caused severe constipation. She noticed that her feet have started swelling since restarting the Celebrex.  Pt dislikes taking calcium. States the tablets are too big to swallow comfortably. Is asking if she can D/C this.  Pt needs refill of clotrimazole-betamethasone cream.  Several weeks ago had itching along panty-line that was similar to intertrigo under breasts.  Seemed to resolve on its own and then recurred.  Not currently present.    Allergies  Allergen Reactions  . Erythromycin Other (See Comments)    Other reaction(s): Headache Headache and GI upset Headache and GI upset  . Losartan Nausea And Vomiting  . Metoprolol Nausea And Vomiting  . Simvastatin Other (See Comments)    Other reaction(s): Muscle Pain Pain lower extremities Pain lower extremities  . Dexlansoprazole Diarrhea  . Lisinopril Cough  . Prednisone     Other reaction(s): Constipation GI upset     Current Outpatient Medications:  .  amLODipine (NORVASC) 5 MG tablet, TAKE 1 TABLET(5 MG) BY MOUTH DAILY, Disp: 30 tablet, Rfl: 5 .  azelastine (ASTELIN) 0.1 % nasal spray, Place 2 sprays into both nostrils daily., Disp: , Rfl:  .  calcium citrate-vitamin D (CITRACAL+D) 315-200 MG-UNIT tablet, Take by mouth., Disp: , Rfl:  .  celecoxib (CELEBREX) 200 MG capsule, Take 1 capsule (200 mg total) by mouth daily., Disp: 30 capsule, Rfl: 2 .  Cholecalciferol (VITAMIN D3) 5000 units CAPS, Take 1  capsule by mouth daily., Disp: , Rfl:  .  clotrimazole-betamethasone (LOTRISONE) cream, Apply 1 application topically 2 (two) times daily as needed., Disp: 30 g, Rfl: 1 .  esomeprazole (NEXIUM) 20 MG capsule, Take 20 mg by mouth daily at 12 noon., Disp: , Rfl:  .  ipratropium (ATROVENT) 0.03 % nasal  spray, Place 2 sprays into both nostrils every 8 (eight) hours as needed., Disp: , Rfl: 12 .  loratadine (CLARITIN) 10 MG tablet, Take 2 tablets by mouth daily., Disp: , Rfl:  .  montelukast (SINGULAIR) 10 MG tablet, Take 1 tablet by mouth daily., Disp: , Rfl:  .  Multiple Vitamins-Minerals (EYE VITAMINS PO), Take by mouth., Disp: , Rfl:  .  Omega-3 Fatty Acids (FISH OIL) 1000 MG CAPS, Take 1 capsule by mouth daily., Disp: , Rfl:  .  pramipexole (MIRAPEX) 0.125 MG tablet, TAKE 4 TABLETS(0.5 MG) BY MOUTH AT BEDTIME, Disp: 120 tablet, Rfl: 3 .  triamcinolone (NASACORT ALLERGY 24HR) 55 MCG/ACT AERO nasal inhaler, Place 2 sprays into the nose daily., Disp: , Rfl:  .  triamterene-hydrochlorothiazide (MAXZIDE-25) 37.5-25 MG tablet, Take 1 tablet by mouth daily., Disp: 90 tablet, Rfl: 2  Review of Systems  Constitutional: Positive for fatigue. Negative for activity change, appetite change, chills, diaphoresis, fever and unexpected weight change.  HENT: Positive for congestion (nasal).   Eyes: Negative.   Respiratory: Negative.  Negative for shortness of breath.   Cardiovascular: Positive for leg swelling. Negative for chest pain and palpitations.  Genitourinary: Negative.   Musculoskeletal: Positive for arthralgias, gait problem and joint swelling.  Neurological: Negative for dizziness, seizures, syncope, facial asymmetry, speech difficulty, weakness, light-headedness, numbness and headaches.    Social History   Tobacco Use  . Smoking status: Former Smoker    Packs/day: 1.00    Years: 10.00    Pack years: 10.00    Types: Cigarettes    Last attempt to quit: 11/28/1976    Years since quitting: 41.5  . Smokeless tobacco: Never Used  Substance Use Topics  . Alcohol use: Yes    Comment: rarely, at holidays   Objective:   BP 120/84 (BP Location: Right Wrist, Patient Position: Sitting, Cuff Size: Large)   Pulse 78   Temp (!) 97.5 F (36.4 C) (Oral)   Resp 16   Wt 224 lb (101.6 kg)   SpO2  95%   BMI 42.32 kg/m  Vitals:   06/22/18 1319  BP: 120/84  Pulse: 78  Resp: 16  Temp: (!) 97.5 F (36.4 C)  TempSrc: Oral  SpO2: 95%  Weight: 224 lb (101.6 kg)     Physical Exam  Constitutional: She is oriented to person, place, and time. She appears well-developed and well-nourished. No distress.  HENT:  Head: Normocephalic and atraumatic.  Eyes: Conjunctivae are normal. No scleral icterus.  Neck: Neck supple. No thyromegaly present.  Cardiovascular: Normal rate, regular rhythm, normal heart sounds and intact distal pulses.  No murmur heard. Pulmonary/Chest: Effort normal and breath sounds normal. No respiratory distress. She has no wheezes. She has no rales.  Abdominal: Soft. Bowel sounds are normal. She exhibits no distension. There is no tenderness.  Musculoskeletal: She exhibits edema (1+ bilateral feet).  Lymphadenopathy:    She has no cervical adenopathy.  Neurological: She is alert and oriented to person, place, and time.  Skin: Skin is warm and dry. Capillary refill takes less than 2 seconds. No rash noted.  Psychiatric: She has a normal mood and affect. Her behavior is normal.  Vitals reviewed.       Assessment & Plan:   Problem List Items Addressed This Visit      Cardiovascular and Mediastinum   Essential hypertension - Primary    Well controlled Continue maxzide and amlodipien at current doses Discussed that amlodipine could be contributing to swelling, but patient is not interested in changing this at this time CMP checked with labs this AM F/u in 6 months        Musculoskeletal and Integument   Osteoarthritis of both knees    Managed by Ortho Can continue celebrex Discussed that this may be contributing to her swelling Unable to tolerate tramadol due to constipation Discussed risks of long-term NSAID use Cr checked with labs earlier today      Intertriginous candidiasis    Rash is not present currently, but suspect patient is describing  intertrigo of groin just like she has under breasts Refilled lotrisone      Relevant Medications   clotrimazole-betamethasone (LOTRISONE) cream     Genitourinary   Chronic renal impairment, stage 3 (moderate) (HCC)    Recheck CMP        Other   RLS (restless legs syndrome)    Well-controlled  continue Mirapex 0.25 mg nightly Can increase to max of 0.5 mg nightly in the future if needed Could also consider Requip in the future if needed      Pedal edema    Bilateral lower extremity edema remains present, but much less significant than previously Suspect this is multifactorial, related to swelling of left knee, venous insufficiency related to obesity, immobility with sitting with legs hanging down frequently, and medication side effects Patient knows that Celebrex and amlodipine can contribute to her swelling She believes that her benefits from these medications outweigh the swelling that she is having She does not want to wear compression stockings No signs of volume overload elsewhere      Obesity    Discussed diet and exercise She is trying to lose weight to be able to have total knee replacement      PA (pernicious anemia)    Recheck CBC and B12 levels      Prediabetes    Recheck A1c      Vitamin D deficiency    Recheck vitamin D Can continue supplement at this time Okay to stop calcium supplement currently          Return in about 6 months (around 12/23/2018) for chronic disease f/u.   The entirety of the information documented in the History of Present Illness, Review of Systems and Physical Exam were personally obtained by me. Portions of this information were initially documented by Raquel Sarna Ratchford, CMA and reviewed by me for thoroughness and accuracy.    Virginia Crews, MD, MPH [facility] 06/22/2018 3:20 PM

## 2018-06-22 NOTE — Assessment & Plan Note (Signed)
Bilateral lower extremity edema remains present, but much less significant than previously Suspect this is multifactorial, related to swelling of left knee, venous insufficiency related to obesity, immobility with sitting with legs hanging down frequently, and medication side effects Patient knows that Celebrex and amlodipine can contribute to her swelling She believes that her benefits from these medications outweigh the swelling that she is having She does not want to wear compression stockings No signs of volume overload elsewhere

## 2018-06-22 NOTE — Assessment & Plan Note (Signed)
Recheck vitamin D Can continue supplement at this time Okay to stop calcium supplement currently

## 2018-06-22 NOTE — Assessment & Plan Note (Signed)
Well-controlled  continue Mirapex 0.25 mg nightly Can increase to max of 0.5 mg nightly in the future if needed Could also consider Requip in the future if needed

## 2018-06-22 NOTE — Patient Instructions (Signed)
Edema Edema is an abnormal buildup of fluids in your bodytissues. Edema is somewhatdependent on gravity to pull the fluid to the lowest place in your body. That makes the condition more common in the legs and thighs (lower extremities). Painless swelling of the feet and ankles is common and becomes more likely as you get older. It is also common in looser tissues, like around your eyes. When the affected area is squeezed, the fluid may move out of that spot and leave a dent for a few moments. This dent is called pitting. What are the causes? There are many possible causes of edema. Eating too much salt and being on your feet or sitting for a long time can cause edema in your legs and ankles. Hot weather may make edema worse. Common medical causes of edema include:  Heart failure.  Liver disease.  Kidney disease.  Weak blood vessels in your legs.  Cancer.  An injury.  Pregnancy.  Some medications.  Obesity.  What are the signs or symptoms? Edema is usually painless.Your skin may look swollen or shiny. How is this diagnosed? Your health care provider may be able to diagnose edema by asking about your medical history and doing a physical exam. You may need to have tests such as X-rays, an electrocardiogram, or blood tests to check for medical conditions that may cause edema. How is this treated? Edema treatment depends on the cause. If you have heart, liver, or kidney disease, you need the treatment appropriate for these conditions. General treatment may include:  Elevation of the affected body part above the level of your heart.  Compression of the affected body part. Pressure from elastic bandages or support stockings squeezes the tissues and forces fluid back into the blood vessels. This keeps fluid from entering the tissues.  Restriction of fluid and salt intake.  Use of a water pill (diuretic). These medications are appropriate only for some types of edema. They pull fluid  out of your body and make you urinate more often. This gets rid of fluid and reduces swelling, but diuretics can have side effects. Only use diuretics as directed by your health care provider.  Follow these instructions at home:  Keep the affected body part above the level of your heart when you are lying down.  Do not sit still or stand for prolonged periods.  Do not put anything directly under your knees when lying down.  Do not wear constricting clothing or garters on your upper legs.  Exercise your legs to work the fluid back into your blood vessels. This may help the swelling go down.  Wear elastic bandages or support stockings to reduce ankle swelling as directed by your health care provider.  Eat a low-salt diet to reduce fluid if your health care provider recommends it.  Only take medicines as directed by your health care provider. Contact a health care provider if:  Your edema is not responding to treatment.  You have heart, liver, or kidney disease and notice symptoms of edema.  You have edema in your legs that does not improve after elevating them.  You have sudden and unexplained weight gain. Get help right away if:  You develop shortness of breath or chest pain.  You cannot breathe when you lie down.  You develop pain, redness, or warmth in the swollen areas.  You have heart, liver, or kidney disease and suddenly get edema.  You have a fever and your symptoms suddenly get worse. This information is   not intended to replace advice given to you by your health care provider. Make sure you discuss any questions you have with your health care provider. Document Released: 11/15/2005 Document Revised: 04/22/2016 Document Reviewed: 09/07/2013 Elsevier Interactive Patient Education  2017 Elsevier Inc.  

## 2018-06-22 NOTE — Assessment & Plan Note (Signed)
Recheck CMP 

## 2018-06-22 NOTE — Assessment & Plan Note (Signed)
Recheck A1c 

## 2018-06-22 NOTE — Progress Notes (Unsigned)
lipid

## 2018-06-22 NOTE — Assessment & Plan Note (Signed)
Recheck CBC and B12 levels

## 2018-06-22 NOTE — Assessment & Plan Note (Signed)
Rash is not present currently, but suspect patient is describing intertrigo of groin just like she has under breasts Refilled lotrisone

## 2018-06-23 ENCOUNTER — Telehealth: Payer: Self-pay

## 2018-06-23 LAB — COMPREHENSIVE METABOLIC PANEL
ALBUMIN: 4 g/dL (ref 3.5–4.8)
ALT: 10 IU/L (ref 0–32)
AST: 11 IU/L (ref 0–40)
Albumin/Globulin Ratio: 1.9 (ref 1.2–2.2)
Alkaline Phosphatase: 80 IU/L (ref 39–117)
BUN / CREAT RATIO: 17 (ref 12–28)
BUN: 20 mg/dL (ref 8–27)
Bilirubin Total: 0.3 mg/dL (ref 0.0–1.2)
CALCIUM: 10.4 mg/dL — AB (ref 8.7–10.3)
CO2: 22 mmol/L (ref 20–29)
CREATININE: 1.16 mg/dL — AB (ref 0.57–1.00)
Chloride: 100 mmol/L (ref 96–106)
GFR calc Af Amer: 52 mL/min/{1.73_m2} — ABNORMAL LOW (ref >59)
GFR, EST NON AFRICAN AMERICAN: 46 mL/min/{1.73_m2} — AB (ref >59)
Globulin, Total: 2.1 g/dL (ref 1.5–4.5)
Glucose: 106 mg/dL — ABNORMAL HIGH (ref 65–99)
Potassium: 4.2 mmol/L (ref 3.5–5.2)
Sodium: 139 mmol/L (ref 134–144)
TOTAL PROTEIN: 6.1 g/dL (ref 6.0–8.5)

## 2018-06-23 LAB — CBC
Hematocrit: 40.4 % (ref 34.0–46.6)
Hemoglobin: 13 g/dL (ref 11.1–15.9)
MCH: 29.9 pg (ref 26.6–33.0)
MCHC: 32.2 g/dL (ref 31.5–35.7)
MCV: 93 fL (ref 79–97)
PLATELETS: 274 10*3/uL (ref 150–450)
RBC: 4.35 x10E6/uL (ref 3.77–5.28)
RDW: 13 % (ref 12.3–15.4)
WBC: 8 10*3/uL (ref 3.4–10.8)

## 2018-06-23 LAB — LIPID PANEL
Chol/HDL Ratio: 3.1 ratio (ref 0.0–4.4)
Cholesterol, Total: 222 mg/dL — ABNORMAL HIGH (ref 100–199)
HDL: 71 mg/dL (ref >39)
LDL CALC: 127 mg/dL — AB (ref 0–99)
Triglycerides: 120 mg/dL (ref 0–149)
VLDL CHOLESTEROL CAL: 24 mg/dL (ref 5–40)

## 2018-06-23 LAB — HEMOGLOBIN A1C
ESTIMATED AVERAGE GLUCOSE: 111 mg/dL
Hgb A1c MFr Bld: 5.5 % (ref 4.8–5.6)

## 2018-06-23 LAB — VITAMIN D 25 HYDROXY (VIT D DEFICIENCY, FRACTURES): Vit D, 25-Hydroxy: 47 ng/mL (ref 30.0–100.0)

## 2018-06-23 LAB — VITAMIN B12: Vitamin B-12: 341 pg/mL (ref 232–1245)

## 2018-06-23 NOTE — Telephone Encounter (Signed)
Pt advised. She chooses to work on lifestyle instead of starting a statin.

## 2018-06-23 NOTE — Telephone Encounter (Signed)
-----   Message from Virginia Crews, MD sent at 06/23/2018 11:43 AM EDT ----- Normal vitamin D and B12 levels.  Normal blood counts.  Cholesterol is high, and 10 year risk of heart disease/stroke is high at 23.5%.  Anything above 7.5%, suggest that you should be taking a statin to lower your cholesterol.  Normal A1c.  Kidney function has slightly worsened from last check, which is likely due to chronic Celebrex use.  Trying to use this less and only as needed would be helpful to the kidneys.  Calcium level is elevated, so it is good that you have stopped her calcium supplement.  Virginia Crews, MD, MPH [facility] 06/23/2018 11:43 AM

## 2018-06-28 ENCOUNTER — Telehealth: Payer: Self-pay | Admitting: Family Medicine

## 2018-06-28 ENCOUNTER — Ambulatory Visit (INDEPENDENT_AMBULATORY_CARE_PROVIDER_SITE_OTHER): Payer: Medicare Other | Admitting: Family Medicine

## 2018-06-28 ENCOUNTER — Encounter: Payer: Self-pay | Admitting: Family Medicine

## 2018-06-28 VITALS — BP 118/92 | HR 70 | Temp 97.6°F | Resp 20

## 2018-06-28 DIAGNOSIS — M5441 Lumbago with sciatica, right side: Secondary | ICD-10-CM

## 2018-06-28 MED ORDER — OXYCODONE-ACETAMINOPHEN 5-325 MG PO TABS: 1.0000 | ORAL_TABLET | ORAL | 0 refills | Status: DC | PRN

## 2018-06-28 MED ORDER — PREDNISONE 20 MG PO TABS: 40.0000 mg | ORAL_TABLET | Freq: Every day | ORAL | 0 refills | Status: DC

## 2018-06-28 NOTE — Telephone Encounter (Signed)
Appointment scheduled.

## 2018-06-28 NOTE — Progress Notes (Signed)
Patient: Gloria Mercer Female    DOB: 05/09/1941   77 y.o.   MRN: [id number] Visit Date: 06/28/2018  Today's Provider: Lavon Paganini, MD   I, Martha Clan, CMA, am acting as scribe for Lavon Paganini, MD.  Chief Complaint  Patient presents with  . Leg Pain   Subjective:    Leg Pain   There was no injury mechanism. Pain location: right low back/ hip pain radiating into right knee. The quality of the pain is described as aching (constant). The pain is severe. The pain has been worsening since onset. Associated symptoms include an inability to bear weight, a loss of motion and muscle weakness. Pertinent negatives include no loss of sensation, numbness or tingling. The symptoms are aggravated by weight bearing and movement (sitting too long, laying too long). Treatments tried: Celebrex. The treatment provided no relief.   She has had pain like this in the past from lumbar spondylosis.  She was previously seen by Dr Mack Guise for this.    Allergies  Allergen Reactions  . Erythromycin Other (See Comments)    Other reaction(s): Headache Headache and GI upset Headache and GI upset  . Losartan Nausea And Vomiting  . Metoprolol Nausea And Vomiting  . Simvastatin Other (See Comments)    Other reaction(s): Muscle Pain Pain lower extremities Pain lower extremities  . Dexlansoprazole Diarrhea  . Lisinopril Cough  . Prednisone     Other reaction(s): Constipation GI upset     Current Outpatient Medications:  .  amLODipine (NORVASC) 5 MG tablet, TAKE 1 TABLET(5 MG) BY MOUTH DAILY, Disp: 30 tablet, Rfl: 5 .  azelastine (ASTELIN) 0.1 % nasal spray, Place 2 sprays into both nostrils daily., Disp: , Rfl:  .  celecoxib (CELEBREX) 200 MG capsule, Take 1 capsule (200 mg total) by mouth daily., Disp: 30 capsule, Rfl: 2 .  Cholecalciferol (VITAMIN D3) 5000 units CAPS, Take 1 capsule by mouth daily., Disp: , Rfl:  .  clotrimazole-betamethasone (LOTRISONE) cream, Apply 1  application topically 2 (two) times daily as needed., Disp: 60 g, Rfl: 1 .  esomeprazole (NEXIUM) 20 MG capsule, Take 20 mg by mouth daily at 12 noon., Disp: , Rfl:  .  ipratropium (ATROVENT) 0.03 % nasal spray, Place 2 sprays into both nostrils every 8 (eight) hours as needed., Disp: , Rfl: 12 .  loratadine (CLARITIN) 10 MG tablet, Take 2 tablets by mouth daily., Disp: , Rfl:  .  montelukast (SINGULAIR) 10 MG tablet, Take 1 tablet by mouth daily., Disp: , Rfl:  .  Multiple Vitamins-Minerals (EYE VITAMINS PO), Take by mouth., Disp: , Rfl:  .  Omega-3 Fatty Acids (FISH OIL) 1000 MG CAPS, Take 1 capsule by mouth daily., Disp: , Rfl:  .  pramipexole (MIRAPEX) 0.125 MG tablet, TAKE 4 TABLETS(0.5 MG) BY MOUTH AT BEDTIME, Disp: 120 tablet, Rfl: 3 .  triamcinolone (NASACORT ALLERGY 24HR) 55 MCG/ACT AERO nasal inhaler, Place 2 sprays into the nose daily., Disp: , Rfl:  .  triamterene-hydrochlorothiazide (MAXZIDE-25) 37.5-25 MG tablet, Take 1 tablet by mouth daily., Disp: 90 tablet, Rfl: 2  Review of Systems  Constitutional: Positive for activity change. Negative for appetite change, chills and fever.  HENT: Negative.   Respiratory: Negative.   Cardiovascular: Negative.   Gastrointestinal: Negative.   Musculoskeletal: Positive for arthralgias, back pain and gait problem. Negative for neck pain and neck stiffness.  Neurological: Positive for weakness. Negative for dizziness, tingling, tremors, syncope, speech difficulty, light-headedness and numbness.  Psychiatric/Behavioral: Negative.  Social History   Tobacco Use  . Smoking status: Former Smoker    Packs/day: 1.00    Years: 10.00    Pack years: 10.00    Types: Cigarettes    Last attempt to quit: 11/28/1976    Years since quitting: 41.6  . Smokeless tobacco: Never Used  Substance Use Topics  . Alcohol use: Yes    Comment: rarely, at holidays   Objective:   BP (!) 118/92 (BP Location: Left Wrist, Patient Position: Sitting, Cuff Size:  Normal)   Pulse 70   Temp 97.6 F (36.4 C) (Oral)   Resp 20   SpO2 99%  Vitals:   06/28/18 1601  BP: (!) 118/92  Pulse: 70  Resp: 20  Temp: 97.6 F (36.4 C)  TempSrc: Oral  SpO2: 99%     Physical Exam  Constitutional: She is oriented to person, place, and time. She appears well-developed and well-nourished. No distress.  HENT:  Head: Normocephalic and atraumatic.  Eyes: Conjunctivae are normal. No scleral icterus.  Neck: Neck supple. No thyromegaly present.  Cardiovascular: Normal rate, regular rhythm, normal heart sounds and intact distal pulses.  No murmur heard. Pulmonary/Chest: Effort normal and breath sounds normal. No respiratory distress. She has no wheezes. She has no rales.  Musculoskeletal: She exhibits no edema or deformity.  Back: TTP over R SI joint and upper buttocks.  Patient unable to cooperate with strength testing due to pain.  Unable to perform SLR either due to pain.  Sensation intact grossly to light touch.    Lymphadenopathy:    She has no cervical adenopathy.  Neurological: She is alert and oriented to person, place, and time. No cranial nerve deficit.  Skin: Skin is warm and dry. Capillary refill takes less than 2 seconds. No rash noted.  Psychiatric: She has a normal mood and affect. Her behavior is normal.  Vitals reviewed.    Reviewed MRI lumbar spine without contrast from 08/18/2017 done at [facility].  This shows multilevel degenerative spondylosis, including moderate right neuroforaminal stenosis at L3-L4, with either a small right foraminal disc extrusion or a disc osteophyte complex that abuts the existing right L3 nerve root and moderate left neuroforaminal stenosis at L4-L5.    Assessment & Plan:   1. Acute right-sided low back pain with right-sided sciatica -No red flag symptoms and history or physical exam today -Patient is in pretty significant pain and unable to cooperate with strength testing at this time - Her symptoms are  consistent with right-sided sciatica with low back pain -This is consistent with her previous MRI lumbar spine that showed right neuro foraminal stenosis at L3-L4 with possible small right foraminal disc extrusion or disc osteophyte complex abutting the right L3 nerve root - Will treat with prednisone burst for 7 days -Advised to take prednisone early in the day and with food -Do not take NSAIDs with prednisone as this is likely to upset her stomach worse - 5-day supply of Percocet given to use as needed for severe pain -Discussed risk of constipation with narcotic pain medications - Extensively discussed return precautions including weakness of her leg, incontinence, sensory loss, worsening pain -If she has any of these, we will need to consider repeat imaging, but this will not be done at this time    Meds ordered this encounter  Medications  . predniSONE (DELTASONE) 20 MG tablet    Sig: Take 2 tablets (40 mg total) by mouth daily with breakfast for 7 days.    Dispense:  14 tablet    Refill:  0  . oxyCODONE-acetaminophen (PERCOCET/ROXICET) 5-325 MG tablet    Sig: Take 1 tablet by mouth every 4 (four) hours as needed for up to 5 days for severe pain.    Dispense:  30 tablet    Refill:  0     Return if symptoms worsen or fail to improve.   The entirety of the information documented in the History of Present Illness, Review of Systems and Physical Exam were personally obtained by me. Portions of this information were initially documented by Raquel Sarna Ratchford, CMA and reviewed by me for thoroughness and accuracy.    Virginia Crews, MD, MPH [facility] 06/29/2018 11:18 AM

## 2018-06-28 NOTE — Telephone Encounter (Signed)
Pt states both legs are bothering her now, the right is worse than the left. Denies extremity coolness, SOB, chest pain. States she believes her left knee is swelling. She states the pain is "constant", and can not describe the pain sensation. She states bearing weight and position changes exacerbate the pain. She has had similar sx about 1 year ago, and believes she was diagnosed with some sort of lumbar spine disorder. Pt can not come to office due to pain. She uses [employer]. Please advise.

## 2018-06-28 NOTE — Telephone Encounter (Signed)
This pain and swelling could be from a lot of different things.  It could be from her knees or her back.  It could also be things like an infection or blood clot.  It is hard to treat this over the phone without seeing it.  It is really important that the patient be seen.  It appears that Dr. Mack Guise with orthopedics was seeing the patient in the past for lumbar spondylosis as well as her knee osteoarthritis.  She can either come in to see [place] or see orthopedics.  If she is in excruciating pain, the emergency room is also an option.  Virginia Crews, MD, MPH [facility] 06/28/2018 10:01 AM

## 2018-06-28 NOTE — Telephone Encounter (Signed)
pt's having extreme rt hip and leg pain.  She does not feel heat in her leg and she does not think its swollen.  I offered her an appt this am but she said she didn't think she could get in.  She would just like a nurse to call her back  CB#  [phone]  Thanks Con Memos

## 2018-06-28 NOTE — Patient Instructions (Signed)
Sciatica Sciatica is pain, numbness, weakness, or tingling along the path of the sciatic nerve. The sciatic nerve starts in the lower back and runs down the back of each leg. The nerve controls the muscles in the lower leg and in the back of the knee. It also provides feeling (sensation) to the back of the thigh, the lower leg, and the sole of the foot. Sciatica is a symptom of another medical condition that pinches or puts pressure on the sciatic nerve. Generally, sciatica only affects one side of the body. Sciatica usually goes away on its own or with treatment. In some cases, sciatica may keep coming back (recur). What are the causes? This condition is caused by pressure on the sciatic nerve, or pinching of the sciatic nerve. This may be the result of:  A disk in between the bones of the spine (vertebrae) bulging out too far (herniated disk).  Age-related changes in the spinal disks (degenerative disk disease).  A pain disorder that affects a muscle in the buttock (piriformis syndrome).  Extra bone growth (bone spur) near the sciatic nerve.  An injury or break (fracture) of the pelvis.  Pregnancy.  Tumor (rare).  What increases the risk? The following factors may make you more likely to develop this condition:  Playing sports that place pressure or stress on the spine, such as football or weight lifting.  Having poor strength and flexibility.  A history of back injury.  A history of back surgery.  Sitting for long periods of time.  Doing activities that involve repetitive bending or lifting.  Obesity.  What are the signs or symptoms? Symptoms can vary from mild to very severe, and they may include:  Any of these problems in the lower back, leg, hip, or buttock: ? Mild tingling or dull aches. ? Burning sensations. ? Sharp pains.  Numbness in the back of the calf or the sole of the foot.  Leg weakness.  Severe back pain that makes movement difficult.  These  symptoms may get worse when you cough, sneeze, or laugh, or when you sit or stand for long periods of time. Being overweight may also make symptoms worse. In some cases, symptoms may recur over time. How is this diagnosed? This condition may be diagnosed based on:  Your symptoms.  A physical exam. Your health care provider may ask you to do certain movements to check whether those movements trigger your symptoms.  You may have tests, including: ? Blood tests. ? X-rays. ? MRI. ? CT scan.  How is this treated? In many cases, this condition improves on its own, without any treatment. However, treatment may include:  Reducing or modifying physical activity during periods of pain.  Exercising and stretching to strengthen your abdomen and improve the flexibility of your spine.  Icing and applying heat to the affected area.  Medicines that help: ? To relieve pain and swelling. ? To relax your muscles.  Injections of medicines that help to relieve pain, irritation, and inflammation around the sciatic nerve (steroids).  Surgery.  Follow these instructions at home: Medicines  Take over-the-counter and prescription medicines only as told by your health care provider.  Do not drive or operate heavy machinery while taking prescription pain medicine. Managing pain  If directed, apply ice to the affected area. ? Put ice in a plastic bag. ? Place a towel between your skin and the bag. ? Leave the ice on for 20 minutes, 2-3 times a day.  After icing, apply   heat to the affected area before you exercise or as often as told by your health care provider. Use the heat source that your health care provider recommends, such as a moist heat pack or a heating pad. ? Place a towel between your skin and the heat source. ? Leave the heat on for 20-30 minutes. ? Remove the heat if your skin turns bright red. This is especially important if you are unable to feel pain, heat, or cold. You may have a  greater risk of getting burned. Activity  Return to your normal activities as told by your health care provider. Ask your health care provider what activities are safe for you. ? Avoid activities that make your symptoms worse.  Take brief periods of rest throughout the day. Resting in a lying or standing position is usually better than sitting to rest. ? When you rest for longer periods, mix in some mild activity or stretching between periods of rest. This will help to prevent stiffness and pain. ? Avoid sitting for long periods of time without moving. Get up and move around at least one time each hour.  Exercise and stretch regularly, as told by your health care provider.  Do not lift anything that is heavier than 10 lb (4.5 kg) while you have symptoms of sciatica. When you do not have symptoms, you should still avoid heavy lifting, especially repetitive heavy lifting.  When you lift objects, always use proper lifting technique, which includes: ? Bending your knees. ? Keeping the load close to your body. ? Avoiding twisting. General instructions  Use good posture. ? Avoid leaning forward while sitting. ? Avoid hunching over while standing.  Maintain a healthy weight. Excess weight puts extra stress on your back and makes it difficult to maintain good posture.  Wear supportive, comfortable shoes. Avoid wearing high heels.  Avoid sleeping on a mattress that is too soft or too hard. A mattress that is firm enough to support your back when you sleep may help to reduce your pain.  Keep all follow-up visits as told by your health care provider. This is important. Contact a health care provider if:  You have pain that wakes you up when you are sleeping.  You have pain that gets worse when you lie down.  Your pain is worse than you have experienced in the past.  Your pain lasts longer than 4 weeks.  You experience unexplained weight loss. Get help right away if:  You lose control  of your bowel or bladder (incontinence).  You have: ? Weakness in your lower back, pelvis, buttocks, or legs that gets worse. ? Redness or swelling of your back. ? A burning sensation when you urinate. This information is not intended to replace advice given to you by your health care provider. Make sure you discuss any questions you have with your health care provider. Document Released: 11/09/2001 Document Revised: 04/20/2016 Document Reviewed: 07/25/2015 Elsevier Interactive Patient Education  2018 [employer]  

## 2018-07-03 ENCOUNTER — Encounter: Payer: Self-pay | Admitting: Family Medicine

## 2018-07-03 ENCOUNTER — Ambulatory Visit (INDEPENDENT_AMBULATORY_CARE_PROVIDER_SITE_OTHER): Payer: Medicare Other | Admitting: Family Medicine

## 2018-07-03 VITALS — BP 142/70 | HR 67 | Temp 97.9°F | Wt 227.0 lb

## 2018-07-03 DIAGNOSIS — M5441 Lumbago with sciatica, right side: Secondary | ICD-10-CM | POA: Diagnosis not present

## 2018-07-03 DIAGNOSIS — K5903 Drug induced constipation: Secondary | ICD-10-CM | POA: Diagnosis not present

## 2018-07-03 MED ORDER — OXYCODONE-ACETAMINOPHEN 5-325 MG PO TABS: 1.0000 | ORAL_TABLET | Freq: Four times a day (QID) | ORAL | 0 refills | Status: DC | PRN

## 2018-07-03 MED ORDER — SENNOSIDES-DOCUSATE SODIUM 8.6-50 MG PO TABS: 1.0000 | ORAL_TABLET | Freq: Two times a day (BID) | ORAL | 1 refills | Status: DC

## 2018-07-03 MED ORDER — PREDNISONE 20 MG PO TABS: 40.0000 mg | ORAL_TABLET | Freq: Every day | ORAL | 0 refills | Status: DC

## 2018-07-03 MED ORDER — CLOTRIMAZOLE-BETAMETHASONE 1-0.05 % EX CREA: 1.0000 "application " | TOPICAL_CREAM | Freq: Two times a day (BID) | CUTANEOUS | 1 refills | Status: DC | PRN

## 2018-07-03 NOTE — Progress Notes (Signed)
Patient: Gloria Mercer Female    DOB: Sep 14, 1941   77 y.o.   MRN: [id number] Visit Date: 07/03/2018  Today's Provider: Lavon Paganini, MD   Chief Complaint  Patient presents with  . Back Pain    Right-sided low back pain with sciatica.    Subjective:    Back Pain  This is a recurrent problem. The current episode started in the past 7 days. The problem has been gradually improving since onset. The pain is present in the lumbar spine. The pain radiates to the right knee and right thigh. Associated symptoms include leg pain. Pertinent negatives include no abdominal pain, bladder incontinence, bowel incontinence, fever, headaches, numbness or perianal numbness. She has tried analgesics (Prednisone) for the symptoms. The treatment provided moderate relief.       Allergies  Allergen Reactions  . Erythromycin Other (See Comments)    Other reaction(s): Headache Headache and GI upset Headache and GI upset  . Losartan Nausea And Vomiting  . Metoprolol Nausea And Vomiting  . Simvastatin Other (See Comments)    Other reaction(s): Muscle Pain Pain lower extremities Pain lower extremities  . Dexlansoprazole Diarrhea  . Lisinopril Cough  . Prednisone     Other reaction(s): Constipation GI upset     Current Outpatient Medications:  .  amLODipine (NORVASC) 5 MG tablet, TAKE 1 TABLET(5 MG) BY MOUTH DAILY, Disp: 30 tablet, Rfl: 5 .  azelastine (ASTELIN) 0.1 % nasal spray, Place 2 sprays into both nostrils daily., Disp: , Rfl:  .  celecoxib (CELEBREX) 200 MG capsule, Take 1 capsule (200 mg total) by mouth daily., Disp: 30 capsule, Rfl: 2 .  Cholecalciferol (VITAMIN D3) 5000 units CAPS, Take 1 capsule by mouth daily., Disp: , Rfl:  .  clotrimazole-betamethasone (LOTRISONE) cream, Apply 1 application topically 2 (two) times daily as needed., Disp: 60 g, Rfl: 1 .  esomeprazole (NEXIUM) 20 MG capsule, Take 20 mg by mouth daily at 12 noon., Disp: , Rfl:  .  ipratropium (ATROVENT)  0.03 % nasal spray, Place 2 sprays into both nostrils every 8 (eight) hours as needed., Disp: , Rfl: 12 .  loratadine (CLARITIN) 10 MG tablet, Take 2 tablets by mouth daily., Disp: , Rfl:  .  montelukast (SINGULAIR) 10 MG tablet, Take 1 tablet by mouth daily., Disp: , Rfl:  .  Multiple Vitamins-Minerals (EYE VITAMINS PO), Take by mouth., Disp: , Rfl:  .  Omega-3 Fatty Acids (FISH OIL) 1000 MG CAPS, Take 1 capsule by mouth daily., Disp: , Rfl:  .  oxyCODONE-acetaminophen (PERCOCET/ROXICET) 5-325 MG tablet, Take 1 tablet by mouth every 4 (four) hours as needed for up to 5 days for severe pain., Disp: 30 tablet, Rfl: 0 .  pramipexole (MIRAPEX) 0.125 MG tablet, TAKE 4 TABLETS(0.5 MG) BY MOUTH AT BEDTIME, Disp: 120 tablet, Rfl: 3 .  predniSONE (DELTASONE) 20 MG tablet, Take 2 tablets (40 mg total) by mouth daily with breakfast for 7 days., Disp: 14 tablet, Rfl: 0 .  triamcinolone (NASACORT ALLERGY 24HR) 55 MCG/ACT AERO nasal inhaler, Place 2 sprays into the nose daily., Disp: , Rfl:  .  triamterene-hydrochlorothiazide (MAXZIDE-25) 37.5-25 MG tablet, Take 1 tablet by mouth daily., Disp: 90 tablet, Rfl: 2  Review of Systems  Constitutional: Positive for chills. Negative for activity change, appetite change, diaphoresis, fatigue, fever and unexpected weight change.  Gastrointestinal: Positive for constipation. Negative for abdominal pain and bowel incontinence.  Genitourinary: Negative for bladder incontinence.  Musculoskeletal: Positive for arthralgias, back pain, joint  swelling and myalgias. Negative for gait problem, neck pain and neck stiffness.  Neurological: Negative for dizziness, light-headedness, numbness and headaches.    Social History   Tobacco Use  . Smoking status: Former Smoker    Packs/day: 1.00    Years: 10.00    Pack years: 10.00    Types: Cigarettes    Last attempt to quit: 11/28/1976    Years since quitting: 41.6  . Smokeless tobacco: Never Used  Substance Use Topics  .  Alcohol use: Yes    Comment: rarely, at holidays   Objective:   BP (!) 142/70 (BP Location: Right Wrist, Patient Position: Sitting, Cuff Size: Normal)   Pulse 67   Temp 97.9 F (36.6 C) (Oral)   Wt 227 lb (103 kg)   SpO2 96%   BMI 42.89 kg/m  Vitals:   07/03/18 1534  BP: (!) 142/70  Pulse: 67  Temp: 97.9 F (36.6 C)  TempSrc: Oral  SpO2: 96%  Weight: 227 lb (103 kg)     Physical Exam  Constitutional: She is oriented to person, place, and time. She appears well-developed and well-nourished. No distress.  HENT:  Head: Normocephalic and atraumatic.  Eyes: Conjunctivae are normal.  Neck: Neck supple. No thyromegaly present.  Cardiovascular: Normal rate, regular rhythm, normal heart sounds and intact distal pulses.  No murmur heard. Pulmonary/Chest: Effort normal and breath sounds normal. No respiratory distress. She has no wheezes. She has no rales.  Musculoskeletal: She exhibits edema. She exhibits no deformity.  Back: TTP over R SI joint, no midline spinous process TTP.  Negative straight leg raise bilaterally.  Strength symmetric and intact in bilateral lower extremities.  Sensation grossly intact to light touch over bilateral lower extremities.  Lymphadenopathy:    She has no cervical adenopathy.  Neurological: She is alert and oriented to person, place, and time.  Skin: Skin is warm and dry. Capillary refill takes less than 2 seconds. No rash noted. No erythema.  Psychiatric: She has a normal mood and affect. Her behavior is normal.  Vitals reviewed.       Assessment & Plan:   1. Acute right-sided low back pain with right-sided sciatica - somewhat improved but continues to take opioids 4-6 times daily  - will extend prednisone burst for additional 7d - will give a small supply of percocet and advised to cut back significantly - will hold off on MRI as she is neuro intact without any red flags and she does not think she will undergo any interventions - return  precautions discussed  2. Drug-induced constipation - 2/2 opioid use - will try Senokot S BID for management - may need to consider a newer agent in the future - will hopefully improve as she is getting off of opioids also    Meds ordered this encounter  Medications  . senna-docusate (SENOKOT-S) 8.6-50 MG tablet    Sig: Take 1-2 tablets by mouth 2 (two) times daily.    Dispense:  60 tablet    Refill:  1  . predniSONE (DELTASONE) 20 MG tablet    Sig: Take 2 tablets (40 mg total) by mouth daily with breakfast for 7 days.    Dispense:  14 tablet    Refill:  0  . oxyCODONE-acetaminophen (PERCOCET/ROXICET) 5-325 MG tablet    Sig: Take 1 tablet by mouth every 6 (six) hours as needed for up to 5 days for severe pain.    Dispense:  20 tablet    Refill:  0  .  clotrimazole-betamethasone (LOTRISONE) cream    Sig: Apply 1 application topically 2 (two) times daily as needed.    Dispense:  60 g    Refill:  1     Return if symptoms worsen or fail to improve.   The entirety of the information documented in the History of Present Illness, Review of Systems and Physical Exam were personally obtained by me. Portions of this information were initially documented by Ashley Royalty, CMA and reviewed by me for thoroughness and accuracy.    Virginia Crews, MD, MPH [facility] 07/03/2018 4:04 PM

## 2018-07-03 NOTE — Patient Instructions (Signed)
Sciatica Sciatica is pain, numbness, weakness, or tingling along the path of the sciatic nerve. The sciatic nerve starts in the lower back and runs down the back of each leg. The nerve controls the muscles in the lower leg and in the back of the knee. It also provides feeling (sensation) to the back of the thigh, the lower leg, and the sole of the foot. Sciatica is a symptom of another medical condition that pinches or puts pressure on the sciatic nerve. Generally, sciatica only affects one side of the body. Sciatica usually goes away on its own or with treatment. In some cases, sciatica may keep coming back (recur). What are the causes? This condition is caused by pressure on the sciatic nerve, or pinching of the sciatic nerve. This may be the result of:  A disk in between the bones of the spine (vertebrae) bulging out too far (herniated disk).  Age-related changes in the spinal disks (degenerative disk disease).  A pain disorder that affects a muscle in the buttock (piriformis syndrome).  Extra bone growth (bone spur) near the sciatic nerve.  An injury or break (fracture) of the pelvis.  Pregnancy.  Tumor (rare).  What increases the risk? The following factors may make you more likely to develop this condition:  Playing sports that place pressure or stress on the spine, such as football or weight lifting.  Having poor strength and flexibility.  A history of back injury.  A history of back surgery.  Sitting for long periods of time.  Doing activities that involve repetitive bending or lifting.  Obesity.  What are the signs or symptoms? Symptoms can vary from mild to very severe, and they may include:  Any of these problems in the lower back, leg, hip, or buttock: ? Mild tingling or dull aches. ? Burning sensations. ? Sharp pains.  Numbness in the back of the calf or the sole of the foot.  Leg weakness.  Severe back pain that makes movement difficult.  These  symptoms may get worse when you cough, sneeze, or laugh, or when you sit or stand for long periods of time. Being overweight may also make symptoms worse. In some cases, symptoms may recur over time. How is this diagnosed? This condition may be diagnosed based on:  Your symptoms.  A physical exam. Your health care provider may ask you to do certain movements to check whether those movements trigger your symptoms.  You may have tests, including: ? Blood tests. ? X-rays. ? MRI. ? CT scan.  How is this treated? In many cases, this condition improves on its own, without any treatment. However, treatment may include:  Reducing or modifying physical activity during periods of pain.  Exercising and stretching to strengthen your abdomen and improve the flexibility of your spine.  Icing and applying heat to the affected area.  Medicines that help: ? To relieve pain and swelling. ? To relax your muscles.  Injections of medicines that help to relieve pain, irritation, and inflammation around the sciatic nerve (steroids).  Surgery.  Follow these instructions at home: Medicines  Take over-the-counter and prescription medicines only as told by your health care provider.  Do not drive or operate heavy machinery while taking prescription pain medicine. Managing pain  If directed, apply ice to the affected area. ? Put ice in a plastic bag. ? Place a towel between your skin and the bag. ? Leave the ice on for 20 minutes, 2-3 times a day.  After icing, apply   heat to the affected area before you exercise or as often as told by your health care provider. Use the heat source that your health care provider recommends, such as a moist heat pack or a heating pad. ? Place a towel between your skin and the heat source. ? Leave the heat on for 20-30 minutes. ? Remove the heat if your skin turns bright red. This is especially important if you are unable to feel pain, heat, or cold. You may have a  greater risk of getting burned. Activity  Return to your normal activities as told by your health care provider. Ask your health care provider what activities are safe for you. ? Avoid activities that make your symptoms worse.  Take brief periods of rest throughout the day. Resting in a lying or standing position is usually better than sitting to rest. ? When you rest for longer periods, mix in some mild activity or stretching between periods of rest. This will help to prevent stiffness and pain. ? Avoid sitting for long periods of time without moving. Get up and move around at least one time each hour.  Exercise and stretch regularly, as told by your health care provider.  Do not lift anything that is heavier than 10 lb (4.5 kg) while you have symptoms of sciatica. When you do not have symptoms, you should still avoid heavy lifting, especially repetitive heavy lifting.  When you lift objects, always use proper lifting technique, which includes: ? Bending your knees. ? Keeping the load close to your body. ? Avoiding twisting. General instructions  Use good posture. ? Avoid leaning forward while sitting. ? Avoid hunching over while standing.  Maintain a healthy weight. Excess weight puts extra stress on your back and makes it difficult to maintain good posture.  Wear supportive, comfortable shoes. Avoid wearing high heels.  Avoid sleeping on a mattress that is too soft or too hard. A mattress that is firm enough to support your back when you sleep may help to reduce your pain.  Keep all follow-up visits as told by your health care provider. This is important. Contact a health care provider if:  You have pain that wakes you up when you are sleeping.  You have pain that gets worse when you lie down.  Your pain is worse than you have experienced in the past.  Your pain lasts longer than 4 weeks.  You experience unexplained weight loss. Get help right away if:  You lose control  of your bowel or bladder (incontinence).  You have: ? Weakness in your lower back, pelvis, buttocks, or legs that gets worse. ? Redness or swelling of your back. ? A burning sensation when you urinate. This information is not intended to replace advice given to you by your health care provider. Make sure you discuss any questions you have with your health care provider. Document Released: 11/09/2001 Document Revised: 04/20/2016 Document Reviewed: 07/25/2015 Elsevier Interactive Patient Education  2018 [employer]  

## 2018-07-07 ENCOUNTER — Telehealth: Payer: Self-pay

## 2018-07-07 DIAGNOSIS — M5441 Lumbago with sciatica, right side: Secondary | ICD-10-CM

## 2018-07-07 MED ORDER — CYCLOBENZAPRINE HCL 5 MG PO TABS: 5.0000 mg | ORAL_TABLET | Freq: Three times a day (TID) | ORAL | 1 refills | Status: DC | PRN

## 2018-07-07 MED ORDER — LIDOCAINE 5 % EX PTCH: 1.0000 | MEDICATED_PATCH | CUTANEOUS | 0 refills | Status: DC

## 2018-07-07 NOTE — Telephone Encounter (Signed)
I think that those are fine to try in the meantime.  I sent Flexeril, muscle relaxer, as well as Lidoderm patch to the pharmacy.  You can buy lidocaine patches of the lower strength over-the-counter.  Sometimes you need to fill out a prior authorization for Lidoderm patches.  Will use muscle relaxers cautiously in people over 65 as these can increase your risk of dizziness, falls, and confusion.  The referral for orthopedics was placed.  Virginia Crews, MD, MPH [facility] 07/07/2018 2:31 PM

## 2018-07-07 NOTE — Telephone Encounter (Signed)
Please review

## 2018-07-07 NOTE — Telephone Encounter (Signed)
Pt advised. Agrees to referral. States she would also like something to help with the pain in the meantime. Her son has had this same problem in the past, and lidocaine patch with a muscle relaxer helped his pain. She is wondering if this would be beneficial to her?

## 2018-07-07 NOTE — Telephone Encounter (Signed)
Pt advised.

## 2018-07-07 NOTE — Telephone Encounter (Signed)
Patient still in extreme back pain that radiates down hip and leg.  Would like to know what you can do to give her some relief  CB# [phone]

## 2018-07-07 NOTE — Telephone Encounter (Signed)
I told patient at the last visit that if the prednisone and oxycodone did not help that she would need to see an Orthopedist for this.  We also discussed not continuing to refill the oxycodone.  She may need additional imaging of the spine at this point.  I'd recommend a referral.  Willing to give a few days worth of pain medicine to hold her over until then, but she needs to be evaluated.  Virginia Crews, MD, MPH [facility] 07/07/2018 12:20 PM

## 2018-07-07 NOTE — Telephone Encounter (Signed)
Pt called back at 9:55 to see if there was an answer yet about her pain saying she is in a lot of pain.   Gloria Mercer

## 2018-07-08 ENCOUNTER — Other Ambulatory Visit: Payer: Self-pay

## 2018-07-08 ENCOUNTER — Encounter: Payer: Self-pay | Admitting: *Deleted

## 2018-07-08 ENCOUNTER — Observation Stay
Admission: EM | Admit: 2018-07-08 | Discharge: 2018-07-11 | Disposition: A | Discharge status: Home Health | Payer: Medicare Other | Attending: Internal Medicine | Admitting: Internal Medicine

## 2018-07-08 ENCOUNTER — Emergency Department: Payer: Medicare Other

## 2018-07-08 DIAGNOSIS — Z6841 Body Mass Index (BMI) 40.0 and over, adult: Secondary | ICD-10-CM | POA: Diagnosis not present

## 2018-07-08 DIAGNOSIS — Z86010 Personal history of colonic polyps: Secondary | ICD-10-CM | POA: Insufficient documentation

## 2018-07-08 DIAGNOSIS — G2581 Restless legs syndrome: Secondary | ICD-10-CM | POA: Insufficient documentation

## 2018-07-08 DIAGNOSIS — R0683 Snoring: Secondary | ICD-10-CM | POA: Diagnosis not present

## 2018-07-08 DIAGNOSIS — E559 Vitamin D deficiency, unspecified: Secondary | ICD-10-CM | POA: Insufficient documentation

## 2018-07-08 DIAGNOSIS — M5442 Lumbago with sciatica, left side: Secondary | ICD-10-CM | POA: Diagnosis not present

## 2018-07-08 DIAGNOSIS — M549 Dorsalgia, unspecified: Secondary | ICD-10-CM

## 2018-07-08 DIAGNOSIS — K222 Esophageal obstruction: Secondary | ICD-10-CM | POA: Diagnosis not present

## 2018-07-08 DIAGNOSIS — Z9049 Acquired absence of other specified parts of digestive tract: Secondary | ICD-10-CM | POA: Insufficient documentation

## 2018-07-08 DIAGNOSIS — Z87891 Personal history of nicotine dependence: Secondary | ICD-10-CM | POA: Insufficient documentation

## 2018-07-08 DIAGNOSIS — M5126 Other intervertebral disc displacement, lumbar region: Secondary | ICD-10-CM | POA: Insufficient documentation

## 2018-07-08 DIAGNOSIS — D72829 Elevated white blood cell count, unspecified: Secondary | ICD-10-CM | POA: Insufficient documentation

## 2018-07-08 DIAGNOSIS — K219 Gastro-esophageal reflux disease without esophagitis: Secondary | ICD-10-CM | POA: Diagnosis not present

## 2018-07-08 DIAGNOSIS — D510 Vitamin B12 deficiency anemia due to intrinsic factor deficiency: Secondary | ICD-10-CM | POA: Diagnosis not present

## 2018-07-08 DIAGNOSIS — M47816 Spondylosis without myelopathy or radiculopathy, lumbar region: Secondary | ICD-10-CM | POA: Insufficient documentation

## 2018-07-08 DIAGNOSIS — Z8249 Family history of ischemic heart disease and other diseases of the circulatory system: Secondary | ICD-10-CM | POA: Insufficient documentation

## 2018-07-08 DIAGNOSIS — E1122 Type 2 diabetes mellitus with diabetic chronic kidney disease: Secondary | ICD-10-CM | POA: Diagnosis not present

## 2018-07-08 DIAGNOSIS — Z888 Allergy status to other drugs, medicaments and biological substances status: Secondary | ICD-10-CM | POA: Insufficient documentation

## 2018-07-08 DIAGNOSIS — R079 Chest pain, unspecified: Secondary | ICD-10-CM | POA: Diagnosis not present

## 2018-07-08 DIAGNOSIS — Z7952 Long term (current) use of systemic steroids: Secondary | ICD-10-CM | POA: Insufficient documentation

## 2018-07-08 DIAGNOSIS — E669 Obesity, unspecified: Secondary | ICD-10-CM | POA: Insufficient documentation

## 2018-07-08 DIAGNOSIS — Z881 Allergy status to other antibiotic agents status: Secondary | ICD-10-CM | POA: Insufficient documentation

## 2018-07-08 DIAGNOSIS — Z833 Family history of diabetes mellitus: Secondary | ICD-10-CM | POA: Insufficient documentation

## 2018-07-08 DIAGNOSIS — E782 Mixed hyperlipidemia: Secondary | ICD-10-CM | POA: Diagnosis not present

## 2018-07-08 DIAGNOSIS — Z820 Family history of epilepsy and other diseases of the nervous system: Secondary | ICD-10-CM | POA: Insufficient documentation

## 2018-07-08 DIAGNOSIS — M48061 Spinal stenosis, lumbar region without neurogenic claudication: Secondary | ICD-10-CM | POA: Diagnosis not present

## 2018-07-08 DIAGNOSIS — E11319 Type 2 diabetes mellitus with unspecified diabetic retinopathy without macular edema: Secondary | ICD-10-CM | POA: Insufficient documentation

## 2018-07-08 DIAGNOSIS — R0602 Shortness of breath: Secondary | ICD-10-CM | POA: Diagnosis not present

## 2018-07-08 DIAGNOSIS — I1310 Hypertensive heart and chronic kidney disease without heart failure, with stage 1 through stage 4 chronic kidney disease, or unspecified chronic kidney disease: Secondary | ICD-10-CM | POA: Diagnosis not present

## 2018-07-08 DIAGNOSIS — Z8042 Family history of malignant neoplasm of prostate: Secondary | ICD-10-CM | POA: Insufficient documentation

## 2018-07-08 DIAGNOSIS — H353124 Nonexudative age-related macular degeneration, left eye, advanced atrophic with subfoveal involvement: Secondary | ICD-10-CM | POA: Insufficient documentation

## 2018-07-08 DIAGNOSIS — N183 Chronic kidney disease, stage 3 (moderate): Secondary | ICD-10-CM | POA: Insufficient documentation

## 2018-07-08 DIAGNOSIS — M170 Bilateral primary osteoarthritis of knee: Secondary | ICD-10-CM | POA: Diagnosis not present

## 2018-07-08 DIAGNOSIS — K644 Residual hemorrhoidal skin tags: Secondary | ICD-10-CM | POA: Insufficient documentation

## 2018-07-08 DIAGNOSIS — K5730 Diverticulosis of large intestine without perforation or abscess without bleeding: Secondary | ICD-10-CM | POA: Diagnosis not present

## 2018-07-08 DIAGNOSIS — K317 Polyp of stomach and duodenum: Secondary | ICD-10-CM | POA: Diagnosis not present

## 2018-07-08 DIAGNOSIS — M5441 Lumbago with sciatica, right side: Secondary | ICD-10-CM | POA: Insufficient documentation

## 2018-07-08 DIAGNOSIS — Z79899 Other long term (current) drug therapy: Secondary | ICD-10-CM | POA: Insufficient documentation

## 2018-07-08 DIAGNOSIS — T380X5A Adverse effect of glucocorticoids and synthetic analogues, initial encounter: Secondary | ICD-10-CM | POA: Insufficient documentation

## 2018-07-08 DIAGNOSIS — K5900 Constipation, unspecified: Secondary | ICD-10-CM | POA: Insufficient documentation

## 2018-07-08 DIAGNOSIS — K648 Other hemorrhoids: Secondary | ICD-10-CM | POA: Insufficient documentation

## 2018-07-08 DIAGNOSIS — G8929 Other chronic pain: Secondary | ICD-10-CM | POA: Diagnosis not present

## 2018-07-08 DIAGNOSIS — Z9851 Tubal ligation status: Secondary | ICD-10-CM | POA: Insufficient documentation

## 2018-07-08 DIAGNOSIS — J309 Allergic rhinitis, unspecified: Secondary | ICD-10-CM | POA: Insufficient documentation

## 2018-07-08 DIAGNOSIS — K449 Diaphragmatic hernia without obstruction or gangrene: Secondary | ICD-10-CM | POA: Insufficient documentation

## 2018-07-08 HISTORY — DX: Sciatica, unspecified side: M54.30

## 2018-07-08 LAB — COMPREHENSIVE METABOLIC PANEL
ALT: 37 U/L (ref 0–44)
ANION GAP: 14 (ref 5–15)
AST: 45 U/L — ABNORMAL HIGH (ref 15–41)
Albumin: 3.7 g/dL (ref 3.5–5.0)
Alkaline Phosphatase: 80 U/L (ref 38–126)
BUN: 33 mg/dL — ABNORMAL HIGH (ref 8–23)
CHLORIDE: 95 mmol/L — AB (ref 98–111)
CO2: 22 mmol/L (ref 22–32)
Calcium: 9.2 mg/dL (ref 8.9–10.3)
Creatinine, Ser: 0.95 mg/dL (ref 0.44–1.00)
GFR calc non Af Amer: 56 mL/min — ABNORMAL LOW (ref >60)
Glucose, Bld: 174 mg/dL — ABNORMAL HIGH (ref 70–99)
POTASSIUM: 3.7 mmol/L (ref 3.5–5.1)
SODIUM: 131 mmol/L — AB (ref 135–145)
Total Bilirubin: 1.4 mg/dL — ABNORMAL HIGH (ref 0.3–1.2)
Total Protein: 6.6 g/dL (ref 6.5–8.1)

## 2018-07-08 LAB — URINALYSIS, COMPLETE (UACMP) WITH MICROSCOPIC
BILIRUBIN URINE: NEGATIVE
Bacteria, UA: NONE SEEN
Glucose, UA: 50 mg/dL — AB
HGB URINE DIPSTICK: NEGATIVE
Ketones, ur: 20 mg/dL — AB
LEUKOCYTES UA: NEGATIVE
NITRITE: NEGATIVE
Protein, ur: 100 mg/dL — AB
SPECIFIC GRAVITY, URINE: 1.011 (ref 1.005–1.030)
pH: 7 (ref 5.0–8.0)

## 2018-07-08 LAB — CBC WITH DIFFERENTIAL/PLATELET
Basophils Absolute: 0 10*3/uL (ref 0–0.1)
Basophils Relative: 0 %
EOS ABS: 0 10*3/uL (ref 0–0.7)
EOS PCT: 0 %
HCT: 44.9 % (ref 35.0–47.0)
Hemoglobin: 15.3 g/dL (ref 12.0–16.0)
LYMPHS ABS: 1.2 10*3/uL (ref 1.0–3.6)
Lymphocytes Relative: 7 %
MCH: 30.4 pg (ref 26.0–34.0)
MCHC: 34.1 g/dL (ref 32.0–36.0)
MCV: 89.1 fL (ref 80.0–100.0)
MONOS PCT: 3 %
Monocytes Absolute: 0.5 10*3/uL (ref 0.2–0.9)
Neutro Abs: 14.1 10*3/uL — ABNORMAL HIGH (ref 1.4–6.5)
Neutrophils Relative %: 90 %
PLATELETS: 313 10*3/uL (ref 150–440)
RBC: 5.05 MIL/uL (ref 3.80–5.20)
RDW: 13.5 % (ref 11.5–14.5)
WBC: 15.9 10*3/uL — ABNORMAL HIGH (ref 3.6–11.0)

## 2018-07-08 LAB — TROPONIN I: Troponin I: <0.03 ng/mL (ref <0.03)

## 2018-07-08 MED ORDER — HYDROCODONE-ACETAMINOPHEN 5-325 MG PO TABS
1.0000 | ORAL_TABLET | Freq: Once | ORAL | Status: AC
  Administered 2018-07-09: 1 via ORAL
  Filled 2018-07-08: qty 1

## 2018-07-08 MED ORDER — MORPHINE SULFATE (PF) 4 MG/ML IV SOLN
4.0000 mg | INTRAVENOUS | Status: DC | PRN
  Administered 2018-07-08 – 2018-07-09 (×2): 4 mg via INTRAVENOUS
  Filled 2018-07-08 (×2): qty 1

## 2018-07-08 MED ORDER — LORAZEPAM 2 MG/ML IJ SOLN
1.0000 mg | Freq: Once | INTRAMUSCULAR | Status: AC
  Administered 2018-07-08: 1 mg via INTRAVENOUS
  Filled 2018-07-08: qty 1

## 2018-07-08 MED ORDER — PROMETHAZINE HCL 25 MG/ML IJ SOLN
12.5000 mg | Freq: Four times a day (QID) | INTRAMUSCULAR | Status: DC | PRN
  Administered 2018-07-08: 12.5 mg via INTRAVENOUS
  Filled 2018-07-08: qty 1

## 2018-07-08 NOTE — ED Provider Notes (Signed)
[facility] Emergency Department Provider Note ____________________________________________  Time seen: Approximately 4:51 PM  I have reviewed the triage vital signs and the nursing notes.  HISTORY  Chief Complaint Back Pain   HPI Gloria Mercer is a 77 y.o. female who presents to the emergency department for treatment and evaluation of back pain.  Patient has had long-standing back pain with intermittent sciatica which had been well controlled until yesterday morning.  She states that pain has intensified significantly and is now radiating from her back, down both buttocks, and down both legs.  She has been unable to ambulate today even with her walker.  She has been on steroids for the past couple of weeks and is also taken Percocet, Flexeril, and has used lidocaine patches without relief.  She notified her primary care provider yesterday that the pain had gotten much worse which is when the Flexeril and lidocaine patches were called into the pharmacy.  Patient states that today, she has been unable to walk and was unable to urinate last time she tried.  She denies any bouts of urinary incontinence or bowel incontinence.  None of her medications have been taken today including her antihypertensive medication.  Past Medical History:  Diagnosis Date  . Allergic rhinitis    on allergy shots, sees Dr. Pryor Ochoa  . Cataract   . GERD (gastroesophageal reflux disease)   . Hypertension   . Macular degeneration    followed by Dr Garwin Brothers at [facility]  . Osteoarthritis   . Sciatica     Patient Active Problem List   Diagnosis Date Noted  . Constipation 01/12/2018  . RLS (restless legs syndrome) 08/29/2017  . Pedal edema 08/29/2017  . Essential hypertension   . Osteoarthritis of both knees   . Gastroesophageal reflux disease without esophagitis   . Cataract   . Macular degeneration   . Allergic rhinitis   . Hypertensive heart disease without heart failure 07/18/2017  .  Snoring 07/18/2017  . Prediabetes 07/05/2017  . Colon polyps 01/12/2017  . Diverticulosis of large intestine without hemorrhage 01/12/2017  . External hemorrhoids 01/12/2017  . Gastric nodule 01/12/2017  . Hiatal hernia 01/12/2017  . Internal hemorrhoids 01/12/2017  . Lipoma of colon 01/12/2017  . Multiple gastric polyps 01/12/2017  . Schatzki's ring of distal esophagus 01/12/2017  . Bloating 12/23/2016  . Dysphagia 12/23/2016  . Epigastric pain 12/23/2016  . Heartburn 12/23/2016  . History of colonic polyps 12/23/2016  . Rectal bleeding 12/23/2016  . Chronic renal impairment, stage 3 (moderate) ([place]) 04/02/2016  . Age-related nuclear cataract of both eyes 10/31/2015  . Exudative age-related macular degeneration of right eye ([place]) 10/31/2015  . Nuclear sclerotic cataract of both eyes 10/31/2015  . Nonexudative age-related macular degeneration, left eye, advanced atrophic with subfoveal involvement 10/31/2015  . Bronchitis, mucopurulent recurrent ([place]) 03/31/2015  . Age-related macular degeneration, dry, left eye 02/18/2015  . Age-related macular degeneration, wet, right eye ([place]) 02/18/2015  . Obesity 05/23/2014  . Diastolic dysfunction 12/07/3233  . Intertriginous candidiasis 06/16/2013  . Mixed hyperlipidemia 10/19/2012  . PA (pernicious anemia) 10/19/2012  . Vitamin D deficiency 10/19/2012    Past Surgical History:  Procedure Laterality Date  . CHOLECYSTECTOMY  2002  . TUBAL LIGATION  1978    Prior to Admission medications   Medication Sig Start Date End Date Taking? Authorizing Provider  amLODipine (NORVASC) 5 MG tablet TAKE 1 TABLET(5 MG) BY MOUTH DAILY 06/09/18  Yes [name], Dionne Bucy, MD  azelastine (ASTELIN) 0.1 %  nasal spray Place 2 sprays into both nostrils daily. 02/04/14  Yes [provider]  celecoxib (CELEBREX) 200 MG capsule Take 1 capsule (200 mg total) by mouth daily. 04/11/18  Yes [name], Dionne Bucy, MD  Cholecalciferol (VITAMIN D3) 5000 units  CAPS Take 1 capsule by mouth daily. 04/17/13  Yes [provider]  clotrimazole-betamethasone (LOTRISONE) cream Apply 1 application topically 2 (two) times daily as needed. 07/03/18  Yes [name], Dionne Bucy, MD  cyclobenzaprine (FLEXERIL) 5 MG tablet Take 1 tablet (5 mg total) by mouth 3 (three) times daily as needed for muscle spasms. 07/07/18  Yes [name], Dionne Bucy, MD  esomeprazole (NEXIUM) 20 MG capsule Take 20 mg by mouth daily at 12 noon.   Yes [provider]  ipratropium (ATROVENT) 0.03 % nasal spray Place 2 sprays into both nostrils every 8 (eight) hours as needed. 08/05/17  Yes [provider]  lidocaine (LIDODERM) 5 % Place 1 patch onto the skin daily. Remove & Discard patch within 12 hours or as directed by MD 07/07/18  Yes [name], Dionne Bucy, MD  loratadine (CLARITIN) 10 MG tablet Take 2 tablets by mouth daily.   Yes [provider]  montelukast (SINGULAIR) 10 MG tablet Take 1 tablet by mouth daily. 08/19/16  Yes [provider]  Multiple Vitamins-Minerals (EYE VITAMINS PO) Take by mouth.   Yes [provider]  Omega-3 Fatty Acids (FISH OIL) 1000 MG CAPS Take 1 capsule by mouth daily.   Yes [provider]  oxyCODONE-acetaminophen (PERCOCET/ROXICET) 5-325 MG tablet Take 1 tablet by mouth every 6 (six) hours as needed for up to 5 days for severe pain. 07/03/18 07/08/18 Yes [name], Dionne Bucy, MD  pramipexole (MIRAPEX) 0.125 MG tablet TAKE 4 TABLETS(0.5 MG) BY MOUTH AT BEDTIME 04/10/18  Yes [name], Dionne Bucy, MD  predniSONE (DELTASONE) 20 MG tablet Take 2 tablets (40 mg total) by mouth daily with breakfast for 7 days. 07/03/18 07/10/18 Yes [name], Dionne Bucy, MD  senna-docusate (SENOKOT-S) 8.6-50 MG tablet Take 1-2 tablets by mouth 2 (two) times daily. 07/03/18  Yes [name], Dionne Bucy, MD  triamcinolone (NASACORT ALLERGY 24HR) 55 MCG/ACT AERO nasal inhaler Place 2 sprays into the nose daily.   Yes [provider]   triamterene-hydrochlorothiazide (MAXZIDE-25) 37.5-25 MG tablet Take 1 tablet by mouth daily. 01/12/18  Yes [name], Dionne Bucy, MD    Allergies Erythromycin; Losartan; Metoprolol; Simvastatin; Dexlansoprazole; Lisinopril; and Prednisone  Family History  Problem Relation Age of Onset  . Hypertension Mother   . Seizures Mother   . Heart disease Father 30       several heart attacks  . Diabetes Father        diet controlled  . Myelodysplastic syndrome Sister   . Prostate cancer Paternal Grandfather   . Breast cancer Neg Hx   . Cervical cancer Neg Hx     Social History Social History   Tobacco Use  . Smoking status: Former Smoker    Packs/day: 1.00    Years: 10.00    Pack years: 10.00    Types: Cigarettes    Last attempt to quit: 11/28/1976    Years since quitting: 41.6  . Smokeless tobacco: Never Used  Substance Use Topics  . Alcohol use: Yes    Comment: rarely, at holidays  . Drug use: No    Review of Systems Constitutional: Well appearing. Respiratory: Negative for dyspnea. Cardiovascular: Negative for change in skin temperature or color. Musculoskeletal:   Negative for chronic steroid use  Negative for trauma in the presence of osteoporosis  Negative for age over 78 and trauma.  Positive for decrease in appetite and nausea  Positive for pain worse at night. Skin: Negative for rash, lesion, or wound.  Genitourinary: Positive for urinary retention. Rectal: Negative for fecal incontinence or new onset constipation/bowel habit changes. Hematological/Immunilogical: Negative for immunosuppression, IV drug use, or fever Neurological: Positive for burning, tingling, numb, electric, radiating pain in the bilateral hips and legs.                        Negative for saddle anesthesia.                        Negative for focal neurologic deficit, progressive or disabling symptoms             Negative for saddle  anesthesia. ____________________________________________   PHYSICAL EXAM:  VITAL SIGNS: ED Triage Vitals  Enc Vitals Group     BP 07/08/18 1640 (!) 186/83     Pulse Rate 07/08/18 1640 76     Resp 07/08/18 1640 20     Temp 07/08/18 1640 97.8 F (36.6 C)     Temp Source 07/08/18 1640 Oral     SpO2 07/08/18 1640 100 %     Weight 07/08/18 1633 227 lb (103 kg)     Height 07/08/18 1633 5\' 3"  (1.6 m)     Head Circumference --      Peak Flow --      Pain Score 07/08/18 1632 8     Pain Loc --      Pain Edu? --      Excl. in [place]? --     Constitutional: Alert and oriented. Well appearing and in no acute distress. Eyes: Conjunctivae are clear without discharge or drainage.  Head: Atraumatic. Neck: Full, active range of motion. Respiratory: Respirations even and unlabored. Musculoskeletal: Limited ROM of the back and extremities, Strength 3/5 of the lower extremities as tested. Neurologic: Unable to attempt straight leg raise. Sensation of the lower extremities is intact. Skin: Atraumatic.  Psychiatric: Behavior and affect are normal.  ____________________________________________   LABS (all labs ordered are listed, but only abnormal results are displayed)  Labs Reviewed  CBC WITH DIFFERENTIAL/PLATELET - Abnormal; Notable for the following components:      Result Value   WBC 15.9 (*)    Neutro Abs 14.1 (*)    All other components within normal limits  COMPREHENSIVE METABOLIC PANEL - Abnormal; Notable for the following components:   Sodium 131 (*)    Chloride 95 (*)    Glucose, Bld 174 (*)    BUN 33 (*)    AST 45 (*)    Total Bilirubin 1.4 (*)    GFR calc non Af Amer 56 (*)    All other components within normal limits  URINALYSIS, COMPLETE (UACMP) WITH MICROSCOPIC - Abnormal; Notable for the following components:   Color, Urine YELLOW (*)    APPearance CLEAR (*)    Glucose, UA 50 (*)    Ketones, ur 20 (*)    Protein, ur 100 (*)    All other components within normal  limits  TROPONIN I  TROPONIN I   ____________________________________________  RADIOLOGY  Pending ____________________________________________   PROCEDURES  Procedure(s) performed:  Procedures ____________________________________________   INITIAL IMPRESSION / ASSESSMENT AND PLAN / ED COURSE  LENOLA LOCKNER is a 77 y.o. female who presents to the  emergency department for treatment and evaluation of acute on chronic back pain.  Symptoms and exam are concerning and therefore MRI will be completed here in the emergency department.  We will also do some lab studies and bladder scan.  ----------------------------------------- 5:45 PM on 07/08/2018 -----------------------------------------  Patient not complaining of chest pressure in addition to her initial presenting complaints.  Troponin and EKG have been requested.  ----------------------------------------- 6:18 PM on 07/08/2018 -----------------------------------------  Patient moved to room 8 for cardiac monitoring after MRI was completed.  Care was relinquished to Dr. Quentin Cornwall.  ----------------------------------------- 7:20 PM on 07/08/2018 -----------------------------------------  Radiologist, Dr. Jobe Igo, reviewed the MRI a second time to look for possible epidural abscess.  He states that despite the fact that the scan was without contrast, that an epidural abscess would typically be easily recognized.  He confirms that he does not see anything that would make him concerned for epidural abscess at this time.  Dr. Quentin Cornwall was notified of the above discussion.  Medications  morphine 4 MG/ML injection 4 mg (4 mg Intravenous Given 07/08/18 1936)  promethazine (PHENERGAN) injection 12.5 mg (12.5 mg Intravenous Given 07/08/18 1938)  HYDROcodone-acetaminophen (NORCO/VICODIN) 5-325 MG per tablet 1 tablet (has no administration in time range)  LORazepam (ATIVAN) injection 1 mg (1 mg Intravenous Given 07/08/18 1736)    ED  Discharge Orders    None       Pertinent labs & imaging results that were available during my care of the patient were reviewed by me and considered in my medical decision making (see chart for details).  _________________________________________   FINAL CLINICAL IMPRESSION(S) / ED DIAGNOSES  Final diagnoses:  Chronic midline low back pain with bilateral sciatica  Chest pain, unspecified type     If controlled substance prescribed during this visit, 12 month history viewed on the [place] prior to issuing an initial prescription for Schedule II or III opiod.    Victorino Dike, FNP 07/08/18 2355    Merlyn Lot, MD 07/10/18 [phone]

## 2018-07-08 NOTE — ED Notes (Signed)
Bladder scan = 139ml. Theodis Shove NP aware.

## 2018-07-08 NOTE — ED Triage Notes (Addendum)
Per EMS report, patient c/o increased back pain that hasn't respond to her home meds. Patient was prescribed Vicodin, lidoderm patch and a muscle relaxer by PMD. PMD was contacted yesterday by family and added the patch and muscle relaxer. Patient lives alone and was able to ambulate from her bedroom to her living room with a walker per EMS report. Patient has a history of sciatica pain down the right leg for two weeks which she describes as a "burning" sensation. Patient states she has not taken any of her regular medications today which includes the blood pressure meds.

## 2018-07-08 NOTE — ED Notes (Addendum)
Patient taken to MRI. Patient appeared more calm, less shaking upon arrival to MRI.

## 2018-07-09 ENCOUNTER — Other Ambulatory Visit: Payer: Self-pay

## 2018-07-09 ENCOUNTER — Emergency Department: Payer: Medicare Other

## 2018-07-09 DIAGNOSIS — M549 Dorsalgia, unspecified: Secondary | ICD-10-CM

## 2018-07-09 DIAGNOSIS — K5900 Constipation, unspecified: Secondary | ICD-10-CM | POA: Diagnosis not present

## 2018-07-09 DIAGNOSIS — G8929 Other chronic pain: Secondary | ICD-10-CM | POA: Diagnosis present

## 2018-07-09 DIAGNOSIS — K219 Gastro-esophageal reflux disease without esophagitis: Secondary | ICD-10-CM | POA: Diagnosis not present

## 2018-07-09 DIAGNOSIS — M545 Low back pain: Secondary | ICD-10-CM | POA: Diagnosis not present

## 2018-07-09 DIAGNOSIS — I10 Essential (primary) hypertension: Secondary | ICD-10-CM | POA: Diagnosis not present

## 2018-07-09 MED ORDER — POLYETHYLENE GLYCOL 3350 17 G PO PACK: 17.0000 g | PACK | Freq: Every day | ORAL | Status: DC | PRN

## 2018-07-09 MED ORDER — PANTOPRAZOLE SODIUM 40 MG PO TBEC
40.0000 mg | DELAYED_RELEASE_TABLET | Freq: Every day | ORAL | Status: DC
  Administered 2018-07-10 – 2018-07-11 (×2): 40 mg via ORAL
  Filled 2018-07-09 (×2): qty 1

## 2018-07-09 MED ORDER — AZELASTINE HCL 0.1 % NA SOLN
2.0000 | Freq: Every day | NASAL | Status: DC
  Administered 2018-07-09 – 2018-07-11 (×3): 2 via NASAL
  Filled 2018-07-09: qty 30

## 2018-07-09 MED ORDER — MONTELUKAST SODIUM 10 MG PO TABS
10.0000 mg | ORAL_TABLET | Freq: Every day | ORAL | Status: DC
  Administered 2018-07-09 – 2018-07-11 (×3): 10 mg via ORAL
  Filled 2018-07-09 (×3): qty 1

## 2018-07-09 MED ORDER — SENNOSIDES-DOCUSATE SODIUM 8.6-50 MG PO TABS
2.0000 | ORAL_TABLET | Freq: Two times a day (BID) | ORAL | Status: DC
  Administered 2018-07-09 – 2018-07-11 (×4): 2 via ORAL
  Filled 2018-07-09 (×4): qty 2

## 2018-07-09 MED ORDER — TRIAMTERENE-HCTZ 37.5-25 MG PO TABS
1.0000 | ORAL_TABLET | Freq: Every day | ORAL | Status: DC
  Administered 2018-07-10 – 2018-07-11 (×2): 1 via ORAL
  Filled 2018-07-09 (×3): qty 1

## 2018-07-09 MED ORDER — GABAPENTIN 300 MG PO CAPS
300.0000 mg | ORAL_CAPSULE | Freq: Three times a day (TID) | ORAL | Status: DC
  Administered 2018-07-09 – 2018-07-11 (×5): 300 mg via ORAL
  Filled 2018-07-09 (×5): qty 1

## 2018-07-09 MED ORDER — MORPHINE SULFATE (PF) 2 MG/ML IV SOLN
2.0000 mg | INTRAVENOUS | Status: DC | PRN
  Administered 2018-07-09 – 2018-07-10 (×4): 2 mg via INTRAVENOUS
  Filled 2018-07-09 (×5): qty 1

## 2018-07-09 MED ORDER — HYDRALAZINE HCL 20 MG/ML IJ SOLN
5.0000 mg | INTRAMUSCULAR | Status: DC | PRN
Start: 2018-07-09 — End: 2018-07-11

## 2018-07-09 MED ORDER — ONDANSETRON HCL 4 MG/2ML IJ SOLN: 4.0000 mg | Freq: Four times a day (QID) | INTRAMUSCULAR | Status: DC | PRN

## 2018-07-09 MED ORDER — CYCLOBENZAPRINE HCL 10 MG PO TABS: 5.0000 mg | ORAL_TABLET | Freq: Three times a day (TID) | ORAL | Status: DC | PRN

## 2018-07-09 MED ORDER — AMLODIPINE BESYLATE 5 MG PO TABS
5.0000 mg | ORAL_TABLET | Freq: Every day | ORAL | Status: DC
  Administered 2018-07-09 – 2018-07-11 (×3): 5 mg via ORAL
  Filled 2018-07-09 (×3): qty 1

## 2018-07-09 MED ORDER — BISACODYL 10 MG RE SUPP: 10.0000 mg | Freq: Every day | RECTAL | Status: DC | PRN

## 2018-07-09 MED ORDER — TRIAMCINOLONE ACETONIDE 55 MCG/ACT NA AERO
2.0000 | INHALATION_SPRAY | Freq: Every day | NASAL | Status: DC
  Administered 2018-07-10 – 2018-07-11 (×2): 2 via NASAL
  Filled 2018-07-09: qty 21.6

## 2018-07-09 MED ORDER — ONDANSETRON HCL 4 MG PO TABS: 4.0000 mg | ORAL_TABLET | Freq: Four times a day (QID) | ORAL | Status: DC | PRN

## 2018-07-09 MED ORDER — PRAMIPEXOLE DIHYDROCHLORIDE 0.25 MG PO TABS
0.1250 mg | ORAL_TABLET | Freq: Every day | ORAL | Status: DC
  Administered 2018-07-09: 0.125 mg via ORAL
  Filled 2018-07-09: qty 1

## 2018-07-09 MED ORDER — KETOROLAC TROMETHAMINE 15 MG/ML IJ SOLN
15.0000 mg | Freq: Three times a day (TID) | INTRAMUSCULAR | Status: DC
  Administered 2018-07-09 – 2018-07-11 (×5): 15 mg via INTRAVENOUS
  Filled 2018-07-09 (×5): qty 1

## 2018-07-09 MED ORDER — IPRATROPIUM BROMIDE 0.03 % NA SOLN
2.0000 | Freq: Three times a day (TID) | NASAL | Status: DC | PRN
  Filled 2018-07-09: qty 30

## 2018-07-09 MED ORDER — ENOXAPARIN SODIUM 40 MG/0.4ML ~~LOC~~ SOLN
40.0000 mg | SUBCUTANEOUS | Status: DC
  Administered 2018-07-09 – 2018-07-10 (×2): 40 mg via SUBCUTANEOUS
  Filled 2018-07-09 (×2): qty 0.4

## 2018-07-09 MED ORDER — DEXAMETHASONE SODIUM PHOSPHATE 10 MG/ML IJ SOLN
10.0000 mg | Freq: Once | INTRAMUSCULAR | Status: AC
  Administered 2018-07-09: 10 mg via INTRAVENOUS
  Filled 2018-07-09: qty 1

## 2018-07-09 MED ORDER — LORATADINE 10 MG PO TABS
10.0000 mg | ORAL_TABLET | Freq: Every day | ORAL | Status: DC
  Administered 2018-07-09 – 2018-07-11 (×3): 10 mg via ORAL
  Filled 2018-07-09 (×3): qty 1

## 2018-07-09 NOTE — ED Notes (Signed)
.   Pt is resting, Respirations even and unlabored, NAD. Stretcher lowest postion and locked. Call bell within reach. Denies any needs at this time RN will continue to monitor.    

## 2018-07-09 NOTE — Progress Notes (Signed)
LCSW called Chaplain to arrange HCPOA sign off for patients son. He completed documentation and Chaplain will arrange witnesses and notary.Son was advised the process and Bonney Roussel is working on this for him and his mother. PT therapist in to see patient at this time. EDP was consulted.  Jahmiya Guidotti LCSW

## 2018-07-09 NOTE — ED Notes (Signed)
This RN spoke with CSW Claudine whom states that she didn't feel pt was SNF appropriate as pt used to ambulate. Claudine tell this RN that the recommendations need to me Home PT. The RN made EDP aware at this time.

## 2018-07-09 NOTE — ED Notes (Signed)
SW at bedside with family at this time.

## 2018-07-09 NOTE — ED Notes (Signed)
Pt family member came to station requesting ICE Chips for pt. RN provide and offered sandwich tray family member states, "No thank you she says she isn't hungry." RN informed them if that changes just to let us know and we will get her a tray. RN will continue to monitor.

## 2018-07-09 NOTE — ED Notes (Signed)
Pt was given a food tray from food services with apple juice. Family at bedside.

## 2018-07-09 NOTE — ED Notes (Signed)
Pt son to nursing station stating that his mother is in a lot of pain. Pt son informed that pt has order for PRN pain medication and that I would bring that in to her. Pt son also concerned because pt has been in the ER for close to 24 hours. This RN explained that we are waiting for social work and PT to decide what next steps are. Pt son concerned because pt has not had bowel movement in 4 days. RN informed pts son that I would speak with Dr. Corky Downs and ask for order for x-ray and stool softener.

## 2018-07-09 NOTE — H&P (Addendum)
[facility] NAME: Gloria Mercer    MR#:  [id number]  DATE OF BIRTH:  October 21, 1941  DATE OF ADMISSION:  07/08/2018  PRIMARY CARE PHYSICIAN: Virginia Crews, MD   REQUESTING/REFERRING PHYSICIAN: Corky Downs  CHIEF COMPLAINT:   Chief Complaint  Patient presents with  . Back Pain    HISTORY OF PRESENT ILLNESS:  Gloria Mercer  is a 77 y.o. female with a known history of HTN, macular degeneration, and osteoarthritis who presented to the ED with right-sided low back pain and right-sided leg pain for the last 2 weeks.  The pain radiates down the inside and outside of her right leg.  The pain feels like a "burning" and "sharp" pain.  She has been seeing her PCP for this and has been prescribed prednisone, flexeril, and percocet, which have helped a little.  She has been in the ED for 24 hours.  She is unable to walk or stand due to pain.  Her family cannot lift her to get her into the car.  She has been seen by social work who is trying to get her set up for home health services, but family still has concerns about her ability to go home.  Her family is unable to pay for SNF.  At baseline, she is able to walk independently without a walker.  She is able to complete all of her ADLs.  Due to her intractable back pain, she was admitted for further management.  PAST MEDICAL HISTORY:   Past Medical History:  Diagnosis Date  . Allergic rhinitis    on allergy shots, sees Dr. Pryor Ochoa  . Cataract   . GERD (gastroesophageal reflux disease)   . Hypertension   . Macular degeneration    followed by Dr Garwin Brothers at [facility]  . Osteoarthritis   . Sciatica     PAST SURGICAL HISTORY:   Past Surgical History:  Procedure Laterality Date  . CHOLECYSTECTOMY  2002  . TUBAL LIGATION  1978    SOCIAL HISTORY:   Social History   Tobacco Use  . Smoking status: Former Smoker    Packs/day: 1.00    Years: 10.00    Pack years: 10.00    Types: Cigarettes      Last attempt to quit: 11/28/1976    Years since quitting: 41.6  . Smokeless tobacco: Never Used  Substance Use Topics  . Alcohol use: Yes    Comment: rarely, at holidays    FAMILY HISTORY:   Family History  Problem Relation Age of Onset  . Hypertension Mother   . Seizures Mother   . Heart disease Father 76       several heart attacks  . Diabetes Father        diet controlled  . Myelodysplastic syndrome Sister   . Prostate cancer Paternal Grandfather   . Breast cancer Neg Hx   . Cervical cancer Neg Hx     DRUG ALLERGIES:   Allergies  Allergen Reactions  . Erythromycin Other (See Comments)    Other reaction(s): Headache Headache and GI upset Headache and GI upset  . Losartan Nausea And Vomiting  . Metoprolol Nausea And Vomiting  . Simvastatin Other (See Comments)    Other reaction(s): Muscle Pain Pain lower extremities Pain lower extremities  . Dexlansoprazole Diarrhea  . Lisinopril Cough  . Prednisone     Other reaction(s): Constipation GI upset    REVIEW OF SYSTEMS:   Review of Systems  Constitutional:  Negative for chills and fever.  HENT: Negative for congestion and sore throat.   Eyes: Negative for blurred vision and double vision.  Respiratory: Negative for cough and shortness of breath.   Cardiovascular: Negative for chest pain, palpitations and leg swelling.  Gastrointestinal: Positive for constipation. Negative for abdominal pain, blood in stool, diarrhea, nausea and vomiting.  Genitourinary: Negative for dysuria, frequency and urgency.  Musculoskeletal: Positive for back pain. Negative for falls and neck pain.  Neurological: Negative for dizziness and headaches.  Psychiatric/Behavioral: Negative for depression. The patient is not nervous/anxious.      MEDICATIONS AT HOME:   Prior to Admission medications   Medication Sig Start Date End Date Taking? Authorizing Provider  amLODipine (NORVASC) 5 MG tablet TAKE 1 TABLET(5 MG) BY MOUTH DAILY  06/09/18  Yes [name], Dionne Bucy, MD  azelastine (ASTELIN) 0.1 % nasal spray Place 2 sprays into both nostrils daily. 02/04/14  Yes [provider]  celecoxib (CELEBREX) 200 MG capsule Take 1 capsule (200 mg total) by mouth daily. 04/11/18  Yes [name], Dionne Bucy, MD  Cholecalciferol (VITAMIN D3) 5000 units CAPS Take 1 capsule by mouth daily. 04/17/13  Yes [provider]  clotrimazole-betamethasone (LOTRISONE) cream Apply 1 application topically 2 (two) times daily as needed. 07/03/18  Yes [name], Dionne Bucy, MD  cyclobenzaprine (FLEXERIL) 5 MG tablet Take 1 tablet (5 mg total) by mouth 3 (three) times daily as needed for muscle spasms. 07/07/18  Yes [name], Dionne Bucy, MD  esomeprazole (NEXIUM) 20 MG capsule Take 20 mg by mouth daily at 12 noon.   Yes [provider]  ipratropium (ATROVENT) 0.03 % nasal spray Place 2 sprays into both nostrils every 8 (eight) hours as needed. 08/05/17  Yes [provider]  lidocaine (LIDODERM) 5 % Place 1 patch onto the skin daily. Remove & Discard patch within 12 hours or as directed by MD 07/07/18  Yes [name], Dionne Bucy, MD  loratadine (CLARITIN) 10 MG tablet Take 2 tablets by mouth daily.   Yes [provider]  montelukast (SINGULAIR) 10 MG tablet Take 1 tablet by mouth daily. 08/19/16  Yes [provider]  Multiple Vitamins-Minerals (EYE VITAMINS PO) Take by mouth.   Yes [provider]  Omega-3 Fatty Acids (FISH OIL) 1000 MG CAPS Take 1 capsule by mouth daily.   Yes [provider]  pramipexole (MIRAPEX) 0.125 MG tablet TAKE 4 TABLETS(0.5 MG) BY MOUTH AT BEDTIME 04/10/18  Yes [name], Dionne Bucy, MD  predniSONE (DELTASONE) 20 MG tablet Take 2 tablets (40 mg total) by mouth daily with breakfast for 7 days. 07/03/18 07/10/18 Yes [name], Dionne Bucy, MD  senna-docusate (SENOKOT-S) 8.6-50 MG tablet Take 1-2 tablets by mouth 2 (two) times daily. 07/03/18  Yes [name], Dionne Bucy, MD  triamcinolone  (NASACORT ALLERGY 24HR) 55 MCG/ACT AERO nasal inhaler Place 2 sprays into the nose daily.   Yes [provider]  triamterene-hydrochlorothiazide (MAXZIDE-25) 37.5-25 MG tablet Take 1 tablet by mouth daily. 01/12/18  Yes [name], Dionne Bucy, MD      VITAL SIGNS:  Blood pressure (!) 177/78, pulse 80, temperature 97.8 F (36.6 C), temperature source Oral, resp. rate 18, height 5\' 3"  (1.6 m), weight 103 kg, SpO2 95 %.  PHYSICAL EXAMINATION:  Physical Exam  GENERAL:  77 y.o.-year-old patient lying in the bed with no acute distress.  EYES: Pupils equal, round, reactive to light and accommodation. No scleral icterus. Extraocular muscles intact.  HEENT: Head atraumatic, normocephalic. Oropharynx and nasopharynx clear. MMM. NECK:  Supple, no jugular venous distention. No thyroid enlargement, no tenderness.  LUNGS: Normal breath sounds bilaterally, no wheezing, rales,rhonchi or crepitation. No use of accessory muscles of respiration.  CARDIOVASCULAR: S1, S2 normal. No murmurs, rubs, or gallops. BACK: +tenderness to palpation of the right side of the low back, no midline tenderness. Decreased ROM secondary to pain.  ABDOMEN: Soft, nontender, nondistended. Bowel sounds present. No organomegaly or mass.  EXTREMITIES: No pedal edema, cyanosis, or clubbing.  NEUROLOGIC: Cranial nerves II through XII are intact. Generalized weakness in the lower extremities due to pain. +numbness of the right inner thigh. PSYCHIATRIC: The patient is alert and oriented x 3.  SKIN: No obvious rash, lesion, or ulcer.   LABORATORY PANEL:   CBC Recent Labs  Lab 07/08/18 1725  WBC 15.9*  HGB 15.3  HCT 44.9  PLT 313   ------------------------------------------------------------------------------------------------------------------  Chemistries  Recent Labs  Lab 07/08/18 1725  NA 131*  K 3.7  CL 95*  CO2 22  GLUCOSE 174*  BUN 33*  CREATININE 0.95  CALCIUM 9.2  AST 45*  ALT 37  ALKPHOS 80    BILITOT 1.4*   ------------------------------------------------------------------------------------------------------------------  Cardiac Enzymes Recent Labs  Lab 07/08/18 2139  TROPONINI <0.03   ------------------------------------------------------------------------------------------------------------------  RADIOLOGY:  Dg Chest 2 View  Result Date: 07/08/2018 CLINICAL DATA:  Shortness of breath.  Evaluate for pneumonia EXAM: CHEST - 2 VIEW COMPARISON:  None FINDINGS: Mild cardiac enlargement. Both lungs are clear. The visualized skeletal structures are unremarkable. IMPRESSION: No active cardiopulmonary disease. Electronically Signed   By: Kerby Moors M.D.   On: 07/08/2018 22:57   Dg Abdomen 1 View  Result Date: 07/09/2018 CLINICAL DATA:  Patient reports abdominal discomfort and constipation over past week. Reports last BM was 4-5 days ago. Hx tubal ligation, cholecystectomy. EXAM: ABDOMEN - 1 VIEW COMPARISON:  None. FINDINGS: Normal bowel gas pattern.  No significant increase in colonic stool. No evidence of renal or ureteral stones. Tubal ligation clips are noted in the pelvis. Soft tissues otherwise unremarkable. No acute skeletal abnormality. IMPRESSION: 1. No acute findings.  No evidence of bowel obstruction. 2. No significant increase in colonic stool. Electronically Signed   By: Lajean Manes M.D.   On: 07/09/2018 11:06   Mr Lumbar Spine Wo Contrast  Result Date: 07/08/2018 CLINICAL DATA:  Lumbar radiculopathy, risk factors (osteoporosis or chronic steroid use or elderly.) Bilateral leg pain for several weeks with sudden increased today. Pain is now greater than 10/10. EXAM: MRI LUMBAR SPINE WITHOUT CONTRAST TECHNIQUE: Multiplanar, multisequence MR imaging of the lumbar spine was performed. No intravenous contrast was administered. COMPARISON:  None. FINDINGS: Segmentation: 5 non rib-bearing lumbar type vertebral bodies are present. Marrow signal and vertebral body heights are  normal. Alignment: Slight retrolisthesis at L2-3 and L3-4. Trace anterolisthesis is present at L4-5. Grade 1 anterolisthesis is present at L5-S1. Leftward curvature is centered at L4. Vertebrae:  Marrow signal and vertebral body heights are normal. Conus medullaris and cauda equina: Conus extends to the L1 level. Conus and cauda equina appear normal. Paraspinal and other soft tissues: Limited imaging of the abdomen is unremarkable. Paraspinous soft tissues are within normal limits. Disc levels: T12-L1: Negative. L1-2: Negative. L2-3: A left paramedian disc protrusion is present. Mild left subarticular narrowing is present. The foramina are patent. L3-4: A broad-based disc protrusion is present. Disc extends into both neural foramina. Central canal is patent. Mild right foraminal narrowing is present. L4-5: A broad-based disc protrusion is present. Moderate facet hypertrophy is noted bilaterally.  A left synovial cyst contributes to mild left subarticular narrowing. Mild foraminal narrowing bilaterally is worse on the left. L5-S1: Moderate facet hypertrophy is present bilaterally. There is uncovering of a broad-based disc protrusion. The central canal and foramina patent. IMPRESSION: 1. Multilevel spondylosis of the lumbar spine with mild leftward curvature at L4-5. 2. Mild left subarticular narrowing at L2-3. 3. Mild right foraminal stenosis at L3-4. 4. Mild left subarticular and left greater than right foraminal narrowing at L4-5. 5. Grade 1 anterolisthesis at L5-S1 without significant stenosis. Electronically Signed   By: San Morelle M.D.   On: 07/08/2018 18:31      IMPRESSION AND PLAN:   Intractable low back pain with right sided sciatica- unable to walk or stand due to pain. MRI lumbar spine with multilevel spondylosis and mild foraminal narrowing. - s/p decadron x 1 in the ED - start gabapentin 300mg  tid and toradol IV tid - morphine for breakthrough pain - continue home flexeril - may need  referral to spine surgeon on d/c for consideration of epidural steroid injection - PT/OT  Hypertension- BPs high in the ED, but has not been getting BP meds - continue home norvasc and maxzide - IV hydralazine prn  Constipation- likely related to opioid use, has not had a BM in 4 days - miralax daily and senokot-s bid - colace suppository prn  Leukocytosis- likely related to steroids, no signs of infection - recheck cbc in the morning  Hyperglycemia- likely related to steroids. Recent a1c was 5.5. - monitor - can add SSI if needed  GERD- stable - PPI daily  Restless leg syndrome - continue home mirapex  All the records are reviewed and case discussed with ED provider. Management plans discussed with the patient, family and they are in agreement.  CODE STATUS: FULL  TOTAL TIME TAKING CARE OF THIS PATIENT: 35 minutes.    Berna Spare Aryiana Klinkner M.D on 07/09/2018 at 2:39 PM  Between 7am to 6pm - Pager - [phone]  After 6pm go to [email or web address] - Proofreader  [facility]  Office  [phone]  CC: Primary care physician; Virginia Crews, MD   Note: This dictation was prepared with Dragon dictation along with smaller phrase technology. Any transcriptional errors that result from this process are unintentional.

## 2018-07-09 NOTE — Evaluation (Signed)
Physical Therapy Evaluation Patient Details Name: Gloria Mercer MRN: [id number] DOB: 28-Mar-1941 Today's Date: 07/09/2018   History of Present Illness  Patient is a 77 year old female admitted for low back pain and lumbar radiculopathy.  PMH includes sciatica, OA, macular degeneration, Htn and GERD.  Clinical Impression  Patient is a 77 year old female who lives in a one story home alone.  She is ambulatory with a RW at baseline.  Pt in bed and reports high pain level (7-9/10) upon PT arrival.  She is able to perform bed mobility with mod A for hand held assist and Vc's for log rolling and appropriate body mechanics to manage back pain.  Pt able to perform sitting balance at EOB without assistance.  She presents with overall moderate strength of UE/LE.  Pt states that a flexed position alleviates her pain somewhat.  She is able to rise from bedside without assistance but with PT and son at side for safety.  Pt states that she doesn't feel she can walk very far due to fear of her R hip "giving out". Pt ambulated along bedside for safety with short shuffling steps and very flexed posture, RW rolled anteriorly.  PT discussed importance of role of PT in managing back pain once pain has been alleviated acutely and pt expressed understanding.  She required assistance to bring LE's over bedside when returning to bed.  Pt will benefit from continued PT with focus on strength, pain management and safe functional mobility.    Follow Up Recommendations Home health PT;Supervision for mobility/OOB    Equipment Recommendations  Other (comment)    Recommendations for Other Services       Precautions / Restrictions Precautions Precautions: Fall Restrictions Weight Bearing Restrictions: No      Mobility  Bed Mobility Overal bed mobility: Needs Assistance Bed Mobility: Supine to Sit;Sit to Supine     Supine to sit: Mod assist Sit to supine: Mod assist   General bed mobility comments: Assistance  to bring LE's over EOB.  Transfers Overall transfer level: Needs assistance Equipment used: Rolling walker (2 wheeled) Transfers: Sit to/from Stand Sit to Stand: Supervision         General transfer comment: Able to stand from EOB with PT and son by side for safety.  Pt relies heavily on RW to stand.  Ambulation/Gait Ambulation/Gait assistance: Min guard Gait Distance (Feet): 4 Feet       Gait velocity interpretation: <1.8 ft/sec, indicate of risk for recurrent falls General Gait Details: Low foot clearance, flexed posture which pt states alleviates her back pain.  Relies on RW for pain relief.  Stairs            Wheelchair Mobility    Modified Rankin (Stroke Patients Only)       Balance Overall balance assessment: Modified Independent                                           Pertinent Vitals/Pain Pain Assessment: 0-10 Pain Score: 7  Pain Location: Low back, R groin area Pain Intervention(s): Limited activity within patient's tolerance;Monitored during session    [place] expects to be discharged to:: Unsure                 Additional Comments: Pt's son stated that they are open to SNF if needed but are interested in getting pain  under control first.  Pt lives in a one story home and has multiple family members available to check in with her throughout the day.    Prior Function Level of Independence: Independent with assistive device(s)         Comments: Uses RW, household ambulator and limited community     Hand Dominance        Extremity/Trunk Assessment   Upper Extremity Assessment Upper Extremity Assessment: Overall WFL for tasks assessed    Lower Extremity Assessment Lower Extremity Assessment: Overall WFL for tasks assessed(Grossly 4/5 bilaterally.)    Cervical / Trunk Assessment Cervical / Trunk Assessment: Kyphotic  Communication   Communication: No difficulties  Cognition  Arousal/Alertness: Awake/alert Behavior During Therapy: WFL for tasks assessed/performed Overall Cognitive Status: Within Functional Limits for tasks assessed                                 General Comments: Follows commands consistently.      General Comments      Exercises     Assessment/Plan    PT Assessment Patient needs continued PT services  PT Problem List Decreased strength;Pain;Decreased mobility       PT Treatment Interventions DME instruction;Gait training;Balance training;Neuromuscular re-education;Stair training;Functional mobility training;Therapeutic activities;Therapeutic exercise;Patient/family education    PT Goals (Current goals can be found in the Care Plan section)  Acute Rehab PT Goals Patient Stated Goal: To get pain under control Time For Goal Achievement: 07/23/18 Potential to Achieve Goals: Good    Frequency Min 2X/week   Barriers to discharge        Co-evaluation               AM-PAC PT "6 Clicks" Daily Activity  Outcome Measure Difficulty turning over in bed (including adjusting bedclothes, sheets and blankets)?: A Lot Difficulty moving from lying on back to sitting on the side of the bed? : A Lot Difficulty sitting down on and standing up from a chair with arms (e.g., wheelchair, bedside commode, etc,.)?: A Lot Help needed moving to and from a bed to chair (including a wheelchair)?: A Lot Help needed walking in hospital room?: A Lot Help needed climbing 3-5 steps with a railing? : A Lot 6 Click Score: 12    End of Session Equipment Utilized During Treatment: Gait belt Activity Tolerance: Patient limited by pain Patient left: in bed;with family/visitor present Nurse Communication: Mobility status PT Visit Diagnosis: Muscle weakness (generalized) (M62.81);Pain Pain - Right/Left: Right Pain - part of body: Leg    Time: 3546-5681 PT Time Calculation (min) (ACUTE ONLY): 17 min   Charges:   PT Evaluation $PT  Eval Low Complexity: 1 Low         Roxanne Gates, PT, DPT   Roxanne Gates 07/09/2018, 1:03 PM

## 2018-07-09 NOTE — Progress Notes (Signed)
LCSW met with family, ED charge, patient and EDP to consult on next best course. In discussion the family has requested that patients pain to be managed and she should stay in observation. Family understands the process and LCSW explained that I will forward patient information thru our [facility] system to see if beds are available for STR in SNF. The current plan family wishes to see if patient improves with medications and rest and will determine level of care tomorrow. PT will need to re-evaluate tomorrow    Family was advised information went out to [facility] that weekday SW will follow up with family to discuss best plan  Rebecca Cairns LCSW

## 2018-07-09 NOTE — NC FL2 (Signed)
[place] LEVEL OF CARE SCREENING TOOL     IDENTIFICATION  Patient Name: Gloria Mercer Birthdate: [birth year] Sex: female Admission Date (Current Location): 07/08/2018  [place] and [place] Number:  Engineering geologist and Address:  [facility], [address] [place], [place] [address]      Provider Number: [id number]  Attending Physician Name and Address:  No att. providers found  Relative Name and Phone Number:   Luanna Salk [phone]    Current Level of Care: Hospital Recommended Level of Care: [place] Prior Approval Number:    Date Approved/Denied:   PASRR Number: [id number]  Discharge Plan: SNF    Current Diagnoses: Patient Active Problem List   Diagnosis Date Noted  . Constipation 01/12/2018  . RLS (restless legs syndrome) 08/29/2017  . Pedal edema 08/29/2017  . Essential hypertension   . Osteoarthritis of both knees   . Gastroesophageal reflux disease without esophagitis   . Cataract   . Macular degeneration   . Allergic rhinitis   . Hypertensive heart disease without heart failure 07/18/2017  . Snoring 07/18/2017  . Prediabetes 07/05/2017  . Colon polyps 01/12/2017  . Diverticulosis of large intestine without hemorrhage 01/12/2017  . External hemorrhoids 01/12/2017  . Gastric nodule 01/12/2017  . Hiatal hernia 01/12/2017  . Internal hemorrhoids 01/12/2017  . Lipoma of colon 01/12/2017  . Multiple gastric polyps 01/12/2017  . Schatzki's ring of distal esophagus 01/12/2017  . Bloating 12/23/2016  . Dysphagia 12/23/2016  . Epigastric pain 12/23/2016  . Heartburn 12/23/2016  . History of colonic polyps 12/23/2016  . Rectal bleeding 12/23/2016  . Chronic renal impairment, stage 3 (moderate) ([place]) 04/02/2016  . Age-related nuclear cataract of both eyes 10/31/2015  . Exudative age-related macular degeneration of right eye ([place]) 10/31/2015  . Nuclear sclerotic cataract of both eyes  10/31/2015  . Nonexudative age-related macular degeneration, left eye, advanced atrophic with subfoveal involvement 10/31/2015  . Bronchitis, mucopurulent recurrent ([place]) 03/31/2015  . Age-related macular degeneration, dry, left eye 02/18/2015  . Age-related macular degeneration, wet, right eye ([place]) 02/18/2015  . Obesity 05/23/2014  . Diastolic dysfunction 60/73/7106  . Intertriginous candidiasis 06/16/2013  . Mixed hyperlipidemia 10/19/2012  . PA (pernicious anemia) 10/19/2012  . Vitamin D deficiency 10/19/2012    Orientation RESPIRATION BLADDER Height & Weight     Self, Time, Situation, Place  Normal Continent Weight: 227 lb (103 kg) Height:  5\' 3"  (160 cm)  BEHAVIORAL SYMPTOMS/MOOD NEUROLOGICAL BOWEL NUTRITION STATUS      Continent Diet  AMBULATORY STATUS COMMUNICATION OF NEEDS Skin   Supervision Verbally Normal                       Personal Care Assistance Level of Assistance  Bathing, Feeding, Dressing, Total care Bathing Assistance: Limited assistance Feeding assistance: Independent Dressing Assistance: Limited assistance Total Care Assistance: Limited assistance   Functional Limitations Info  Sight, Hearing, Speech Sight Info: Adequate Hearing Info: Adequate Speech Info: Adequate    SPECIAL CARE FACTORS FREQUENCY  PT (By licensed PT), OT (By licensed OT)     PT Frequency: x5 OT Frequency: x5            Contractures Contractures Info: Not present    Additional Factors Info  Code Status  Allergies Code Status Info: Full code   Erythromycin, Losartan, Metoprolol, Simvastatin, Dexlansoprazole, Lisinopril, Prednisone           Current Medications (07/09/2018):  This is the current  hospital active medication list Current Facility-Administered Medications  Medication Dose Route Frequency Provider Last Rate Last Dose  . morphine 4 MG/ML injection 4 mg  4 mg Intravenous Q3H PRN Merlyn Lot, MD   4 mg at 07/08/18 1936  . promethazine  (PHENERGAN) injection 12.5 mg  12.5 mg Intravenous Q6H PRN Merlyn Lot, MD   12.5 mg at 07/08/18 5003   Current Outpatient Medications  Medication Sig Dispense Refill  . amLODipine (NORVASC) 5 MG tablet TAKE 1 TABLET(5 MG) BY MOUTH DAILY 30 tablet 5  . azelastine (ASTELIN) 0.1 % nasal spray Place 2 sprays into both nostrils daily.    . celecoxib (CELEBREX) 200 MG capsule Take 1 capsule (200 mg total) by mouth daily. 30 capsule 2  . Cholecalciferol (VITAMIN D3) 5000 units CAPS Take 1 capsule by mouth daily.    . clotrimazole-betamethasone (LOTRISONE) cream Apply 1 application topically 2 (two) times daily as needed. 60 g 1  . cyclobenzaprine (FLEXERIL) 5 MG tablet Take 1 tablet (5 mg total) by mouth 3 (three) times daily as needed for muscle spasms. 30 tablet 1  . esomeprazole (NEXIUM) 20 MG capsule Take 20 mg by mouth daily at 12 noon.    Marland Kitchen ipratropium (ATROVENT) 0.03 % nasal spray Place 2 sprays into both nostrils every 8 (eight) hours as needed.  12  . lidocaine (LIDODERM) 5 % Place 1 patch onto the skin daily. Remove & Discard patch within 12 hours or as directed by MD 30 patch 0  . loratadine (CLARITIN) 10 MG tablet Take 2 tablets by mouth daily.    . montelukast (SINGULAIR) 10 MG tablet Take 1 tablet by mouth daily.    . Multiple Vitamins-Minerals (EYE VITAMINS PO) Take by mouth.    . Omega-3 Fatty Acids (FISH OIL) 1000 MG CAPS Take 1 capsule by mouth daily.    . pramipexole (MIRAPEX) 0.125 MG tablet TAKE 4 TABLETS(0.5 MG) BY MOUTH AT BEDTIME 120 tablet 3  . predniSONE (DELTASONE) 20 MG tablet Take 2 tablets (40 mg total) by mouth daily with breakfast for 7 days. 14 tablet 0  . senna-docusate (SENOKOT-S) 8.6-50 MG tablet Take 1-2 tablets by mouth 2 (two) times daily. 60 tablet 1  . triamcinolone (NASACORT ALLERGY 24HR) 55 MCG/ACT AERO nasal inhaler Place 2 sprays into the nose daily.    Marland Kitchen triamterene-hydrochlorothiazide (MAXZIDE-25) 37.5-25 MG tablet Take 1 tablet by mouth daily. 90  tablet 2     Discharge Medications: Please see discharge summary for a list of discharge medications.  Relevant Imaging Results:  Relevant Lab Results:   Additional Information SSN [id number]   Joana Reamer,

## 2018-07-09 NOTE — ED Notes (Signed)
Physical Therapy at bedside.

## 2018-07-09 NOTE — ED Notes (Signed)
Called and spoke with PT to check status of patients evaluation. Pt son informed PT should be down within the next 30 minutes to complete consult.   Pt son request to speak with Dr. Corky Downs.

## 2018-07-09 NOTE — Progress Notes (Signed)
Patient arrived from ED. POA at bedside. Updated patient and POA on plan of care. VSS. Patient would like to eat dinner first and then try some pain medicine afterwards. Call bell in reach. Continue to monitor.

## 2018-07-09 NOTE — ED Notes (Signed)
Pt ambulatory with walker to commode, minimal assistance needed. RN will monitor.

## 2018-07-09 NOTE — ED Notes (Signed)
Patient resting quietly with eyes closed

## 2018-07-09 NOTE — ED Notes (Addendum)
Physical Therapy called this RN to notify of her recommendations is SNF placement for pt at this time. RN asked Physical therapy to notify SW for placement.

## 2018-07-09 NOTE — Progress Notes (Addendum)
   07/09/18 1117  Clinical Encounter Type  Visited With Patient and family together  Visit Type Initial (page for AD)  Referral From Social work  Consult/Referral To Patent examiner provided education and document for patient and son.  Chaplain located witnesses and notary; completed document.  Original and one copy given to patient and son; other copy given to unit clerk.  Addendum:  Document chaplain provided was an updated, streamlined version to one that patient/son had with them.

## 2018-07-09 NOTE — Clinical Social Work Note (Signed)
Clinical Social Work Assessment  Patient Details  Name: Gloria Mercer MRN: [id number] Date of Birth: [birth year]  Date of referral:  07/09/18               Reason for consult:  Discharge Planning                Permission sought to share information with:  Family Supports Permission granted to share information::  Yes, Verbal Permission Granted  Name::     Gloria Mercer ( son) [phone]  Agency::     Relationship::     Contact Information:     Housing/Transportation Living arrangements for the past 2 months:  Apartment Source of Information:  Patient, Adult Children Patient Interpreter Needed:  None Criminal Activity/Legal Involvement Pertinent to Current Situation/Hospitalization:  No - Comment as needed Significant Relationships:  Adult Children, Other Family Members Lives with:  Self Do you feel safe going back to the place where you live?  Yes Need for family participation in patient care:  Yes (Comment)  Care giving concerns: Son reports he is concerned as she is not managing her pain well and now has limited mobility with pain in back.   Social Worker assessment / plan: LCSW introduced myself to patient and son and asked for verbal consent to continue to assess patient. It was granted. Patient has come to ED 16 hours ago to manage her pain and reported innitially was unable to walk. She did ambulate with walker to bathroom. It was explained we would have to wait for PT consult. It was explained that STR could work however they would not meet inpatient criteria at hospital and this worker offered private pay options ( SNF and ALF list provided). LCSW provided HCPOA booklet to son, and Engineer, petroleum and Personal care workers list. In discussion the family understands she would DC with in home PT and OT based on PT recommendations. EDP will provide written orders for home health PT and OT and SW. She was encouraged to follow up with her orthopaedic doctor  and family doctor for pain management. LCSW offered refreshments and family and patient reports they are well. 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[employer]) Insurance information:  Medicare([employer] Insurance) PT Recommendations:  Not assessed at this time Information / Referral to community resources:  Other (Comment Required)([employer] handout,Personal support worker care list, SNF handouts)  Patient/Family's Response to care: Good understanding  Patient/Family's Understanding of and Emotional Response to Diagnosis, Current Treatment, and Prognosis: Good understanding  Emotional Assessment Appearance:  Appears stated age Attitude/Demeanor/Rapport:  Gracious Affect (typically observed):  Calm, Accepting Orientation:  Oriented to Self, Oriented to Place, Oriented to  Time, Oriented to Situation Alcohol / Substance use:  Not Applicable Psych involvement (Current and /or in the community):  No (Comment)  Discharge Needs  Concerns to be addressed:  Care Coordination Readmission within the last 30 days:  No Current discharge risk:  None Barriers to Discharge:  No Barriers Identified   Joana Reamer, LCSW 07/09/2018, 9:04 AM

## 2018-07-09 NOTE — ED Notes (Signed)
Pt transferred to xray at this time. RN called Food Service and requested food tray for pt. Verbal order received for diet order.

## 2018-07-10 DIAGNOSIS — K219 Gastro-esophageal reflux disease without esophagitis: Secondary | ICD-10-CM | POA: Diagnosis not present

## 2018-07-10 DIAGNOSIS — M47816 Spondylosis without myelopathy or radiculopathy, lumbar region: Secondary | ICD-10-CM | POA: Diagnosis not present

## 2018-07-10 DIAGNOSIS — G8929 Other chronic pain: Secondary | ICD-10-CM | POA: Diagnosis not present

## 2018-07-10 DIAGNOSIS — I10 Essential (primary) hypertension: Secondary | ICD-10-CM | POA: Diagnosis not present

## 2018-07-10 DIAGNOSIS — M545 Low back pain: Secondary | ICD-10-CM | POA: Diagnosis not present

## 2018-07-10 DIAGNOSIS — M549 Dorsalgia, unspecified: Secondary | ICD-10-CM | POA: Diagnosis not present

## 2018-07-10 DIAGNOSIS — K5900 Constipation, unspecified: Secondary | ICD-10-CM | POA: Diagnosis not present

## 2018-07-10 DIAGNOSIS — R262 Difficulty in walking, not elsewhere classified: Secondary | ICD-10-CM | POA: Diagnosis not present

## 2018-07-10 LAB — CBC
HCT: 41.8 % (ref 35.0–47.0)
Hemoglobin: 14.1 g/dL (ref 12.0–16.0)
MCH: 30.9 pg (ref 26.0–34.0)
MCHC: 33.8 g/dL (ref 32.0–36.0)
MCV: 91.4 fL (ref 80.0–100.0)
PLATELETS: 277 10*3/uL (ref 150–440)
RBC: 4.57 MIL/uL (ref 3.80–5.20)
RDW: 13.8 % (ref 11.5–14.5)
WBC: 12.4 10*3/uL — AB (ref 3.6–11.0)

## 2018-07-10 LAB — BASIC METABOLIC PANEL
Anion gap: 8 (ref 5–15)
BUN: 42 mg/dL — ABNORMAL HIGH (ref 8–23)
CO2: 27 mmol/L (ref 22–32)
CREATININE: 1.07 mg/dL — AB (ref 0.44–1.00)
Calcium: 8.8 mg/dL — ABNORMAL LOW (ref 8.9–10.3)
Chloride: 98 mmol/L (ref 98–111)
GFR calc non Af Amer: 49 mL/min — ABNORMAL LOW (ref >60)
GFR, EST AFRICAN AMERICAN: 57 mL/min — AB (ref >60)
Glucose, Bld: 117 mg/dL — ABNORMAL HIGH (ref 70–99)
Potassium: 3.8 mmol/L (ref 3.5–5.1)
SODIUM: 133 mmol/L — AB (ref 135–145)

## 2018-07-10 MED ORDER — OXYCODONE-ACETAMINOPHEN 5-325 MG PO TABS
1.0000 | ORAL_TABLET | ORAL | Status: DC | PRN
  Administered 2018-07-10: 2 via ORAL
  Filled 2018-07-10: qty 2

## 2018-07-10 MED ORDER — PRAMIPEXOLE DIHYDROCHLORIDE 0.25 MG PO TABS
0.5000 mg | ORAL_TABLET | Freq: Every day | ORAL | Status: DC
  Administered 2018-07-10: 0.5 mg via ORAL
  Filled 2018-07-10: qty 2

## 2018-07-10 NOTE — Care Management Note (Signed)
Case Management Note  Patient Details  Name: Gloria Mercer MRN: [id number] Date of Birth: Nov 01, 1941  Subjective/Objective:  Admitted with intractable back pain, lumbar spine spondylosis. It is anticipated patient will discharge tomorrow and will need home health PT/HHA. Offered a list of home health agencies. [facility] to [facility] for [facility] and HHA.  Patient lives alone. She uses a cane and a walker.  PCP is Dr. Brita Romp.                  Action/Plan: [facility] for PT and HHA  Expected Discharge Date:                  Expected Discharge Plan:  [name]  In-House Referral:     Discharge planning Services  CM Consult  Post Acute Care Choice:  Home Health Choice offered to:  Patient  DME Arranged:    DME Agency:     HH Arranged:  PT, Nurse's Aide [place] Agency:  [facility]  Status of Service:  In process, will continue to follow  If discussed at Long Length of Stay Meetings, dates discussed:    Additional Comments:  Jolly Mango, RN 07/10/2018, 12:26 PM

## 2018-07-10 NOTE — Evaluation (Signed)
Occupational Therapy Evaluation Patient Details Name: Gloria Mercer MRN: [id number] DOB: [birth year] Today's Date: 07/10/2018    History of Present Illness Patient is a 77 year old female admitted for low back pain and lumbar radiculopathy.  PMH includes sciatica, OA, macular degeneration, Htn and GERD.   Clinical Impression   Pt seen for OT evaluation this date. Prior to hospital admission, pt was independent, living alone and able to care for all ADL and IADL needs herself. No falls in past 12 months. Currently pt demonstrates impairments in pain and knowledge of AE/DME, requiring min assist for LB ADL tasks. Pt fatigued after receiving medications, per pt report, so mobility deferred (required mod assist for bed mobility previous date with PT, and supervision to CGA for transfers and ambulating a few feet with RW). Pt educated in cognitive behavioral pain coping strategies and AE/DME to improve independence and safety while minimizing back pain. Pt verbalized understanding. Pt would benefit from skilled OT to address noted impairments and functional limitations (see below for any additional details) in order to maximize safety and independence while minimizing falls risk and caregiver burden.  Upon hospital discharge, recommend pt discharge home with initial supervision assist for mobility and OOB ADL. Recommend HHOT services. RNCM notified.     Follow Up Recommendations  Home health OT;Supervision - Intermittent(initial supervision for mobility/OOB ADL)    Equipment Recommendations  3 in 1 bedside commode    Recommendations for Other Services       Precautions / Restrictions Precautions Precautions: Fall Restrictions Weight Bearing Restrictions: No      Mobility Bed Mobility               General bed mobility comments: pt fatigued, declines mobility at this time; required mod assist with PT previous date 2/2 pain  Transfers                 General transfer  comment: pt fatigued, declines mobility at this time; required supervision assist and RW with PT previous date 2/2 pain    Balance                                           ADL either performed or assessed with clinical judgement   ADL Overall ADL's : Needs assistance/impaired             Lower Body Bathing: Sit to/from stand;Minimal assistance Lower Body Bathing Details (indicate cue type and reason): instructed in seated bathing to minimize back pain and maximize independence     Lower Body Dressing: Sit to/from stand;Minimal assistance Lower Body Dressing Details (indicate cue type and reason): instructed in AE for LB dressing to minimize back pain and maximize independence Toilet Transfer: RW;Min guard;BSC                   Vision Baseline Vision/History: Wears glasses Wears Glasses: At all times Patient Visual Report: No change from baseline       Perception     Praxis      Pertinent Vitals/Pain Pain Assessment: 0-10 Pain Score: 4  Pain Location: low back Pain Intervention(s): Limited activity within patient's tolerance;Monitored during session;Premedicated before session;Repositioned;Utilized relaxation techniques     Hand Dominance     Extremity/Trunk Assessment Upper Extremity Assessment Upper Extremity Assessment: Overall WFL for tasks assessed   Lower Extremity Assessment Lower Extremity Assessment: Overall WFL for tasks  assessed   Cervical / Trunk Assessment Cervical / Trunk Assessment: Kyphotic   Communication Communication Communication: No difficulties   Cognition Arousal/Alertness: Awake/alert Behavior During Therapy: WFL for tasks assessed/performed Overall Cognitive Status: Within Functional Limits for tasks assessed                                 General Comments: fatigued, recently received medication, per pt   General Comments       Exercises Other Exercises Other Exercises: Pt educated in  cognitive behavioral pain coping strategies including progressive muscle relaxation, distraction, and pleasant imagery Other Exercises: Pt educated in AE/DME for bathing, dressing, toileting, and IADL to improve safety and independence while minimizing need to bend over or cause increased low back pain   Shoulder Instructions      Home Living Family/patient expects to be discharged to:: Private residence Living Arrangements: Alone Available Help at Discharge: Family;Available PRN/intermittently(family checks in on pt a couple times/day) Type of Home: House Home Access: Stairs to enter [employer] of Steps: 1 Entrance Stairs-Rails: None Home Layout: One level     Bathroom Shower/Tub: Walk-in Scientist, product/process development)   Bathroom Toilet: Handicapped height     Home Equipment: Environmental consultant - 2 wheels;Cane - single point;Adaptive equipment Adaptive Equipment: Reacher;Sock aid        Prior Functioning/Environment Level of Independence: Independent with assistive device(s)        Comments: Uses RW, household ambulator and limited community, driving until 3-4 days ago when pain started, no falls        OT Problem List: Decreased knowledge of use of DME or AE;Pain      OT Treatment/Interventions: Self-care/ADL training;Therapeutic exercise;Therapeutic activities;Energy conservation;DME and/or AE instruction;Patient/family education    OT Goals(Current goals can be found in the care plan section) Acute Rehab OT Goals Patient Stated Goal: To get pain under control OT Goal Formulation: With patient Time For Goal Achievement: 07/24/18 Potential to Achieve Goals: Good ADL Goals Pt Will Perform Lower Body Dressing: with modified independence;sit to/from stand;with adaptive equipment Pt Will Transfer to Toilet: with modified independence;ambulating(elevated commode, LRAD for mobility) Additional ADL Goal #1: Pt will verbalize plan to implement at least 1 learned energy  conservation strategy and/or pain coping skill to maximize safety and independence in the home.  OT Frequency: Min 1X/week   Barriers to D/C:            Co-evaluation              AM-PAC PT "6 Clicks" Daily Activity     Outcome Measure Help from another person eating meals?: None Help from another person taking care of personal grooming?: None Help from another person toileting, which includes using toliet, bedpan, or urinal?: A Little Help from another person bathing (including washing, rinsing, drying)?: A Little Help from another person to put on and taking off regular upper body clothing?: None Help from another person to put on and taking off regular lower body clothing?: A Little 6 Click Score: 21   End of Session    Activity Tolerance: Patient limited by fatigue Patient left: in bed;with call bell/phone within reach;with bed alarm set  OT Visit Diagnosis: Other abnormalities of gait and mobility (R26.89);Pain Pain - Right/Left: (low back)                Time: 1435-1455 OT Time Calculation (min): 20 min Charges:  OT General Charges $OT Visit: 1  Visit OT Evaluation $OT Eval Low Complexity: 1 Low OT Treatments $Self Care/Home Management : 8-22 mins  Jeni Salles, MPH, MS, OTR/L ascom [phone] 07/10/18, 3:13 PM

## 2018-07-10 NOTE — Care Management Obs Status (Signed)
[place] NOTIFICATION   Patient Details  Name: MA MUNOZ MRN: [id number] Date of Birth: 1941-09-27   Medicare Observation Status Notification Given:  Yes    Jolly Mango, RN 07/10/2018, 12:16 PM

## 2018-07-10 NOTE — Progress Notes (Signed)
RN asked pt twice if she would like to get in the chair for meal and pt declined due to pain. PRN and scheduled pain medications have been given with improvement in pain level.  [place], Jerry Caras

## 2018-07-10 NOTE — Progress Notes (Signed)
Pt able to stand up and walk over to bedside commode using walker.

## 2018-07-10 NOTE — Progress Notes (Signed)
Pt alert and oriented. Medicated for pain during the night. Pt was able to sleep in between care. Up and getting out of bed to bedside commode with 1 person assist.

## 2018-07-10 NOTE — Progress Notes (Addendum)
[facility] [place] NAME: Gloria Mercer    MR#:  [id number]  DATE OF BIRTH:  1941/09/19  SUBJECTIVE:  CHIEF COMPLAINT:   Chief Complaint  Patient presents with  . Back Pain  Patient seen and evaluated today Has low back pain and radiates down the right lower extremity No bowel and bladder incontinence No vomitings  REVIEW OF SYSTEMS:    ROS  CONSTITUTIONAL: No documented fever. No fatigue, weakness. No weight gain, no weight loss.  EYES: No blurry or double vision.  ENT: No tinnitus. No postnasal drip. No redness of the oropharynx.  RESPIRATORY: No cough, no wheeze, no hemoptysis. No dyspnea.  CARDIOVASCULAR: No chest pain. No orthopnea. No palpitations. No syncope.  GASTROINTESTINAL: No nausea, no vomiting or diarrhea. No abdominal pain. No melena or hematochezia.  GENITOURINARY: No dysuria or hematuria.  ENDOCRINE: No polyuria or nocturia. No heat or cold intolerance.  HEMATOLOGY: No anemia. No bruising. No bleeding.  INTEGUMENTARY: No rashes. No lesions.  MUSCULOSKELETAL: No arthritis. No swelling. No gout.  Has low back pain NEUROLOGIC: No numbness, tingling, or ataxia. No seizure-type activity.  PSYCHIATRIC: No anxiety. No insomnia. No ADD.   DRUG ALLERGIES:   Allergies  Allergen Reactions  . Erythromycin Other (See Comments)    Other reaction(s): Headache Headache and GI upset Headache and GI upset  . Losartan Nausea And Vomiting  . Metoprolol Nausea And Vomiting  . Simvastatin Other (See Comments)    Other reaction(s): Muscle Pain Pain lower extremities Pain lower extremities  . Dexlansoprazole Diarrhea  . Lisinopril Cough  . Prednisone     Other reaction(s): Constipation GI upset    VITALS:  Blood pressure (!) 172/80, pulse 75, temperature 97.6 F (36.4 C), temperature source Oral, resp. rate 18, height 5\' 3"  (1.6 m), weight 103 kg, SpO2 96 %.  PHYSICAL EXAMINATION:   Physical Exam  GENERAL:  77  y.o.-year-old patient lying in the bed with no acute distress.  EYES: Pupils equal, round, reactive to light and accommodation. No scleral icterus. Extraocular muscles intact.  HEENT: Head atraumatic, normocephalic. Oropharynx and nasopharynx clear.  NECK:  Supple, no jugular venous distention. No thyroid enlargement, no tenderness.  LUNGS: Normal breath sounds bilaterally, no wheezing, rales, rhonchi. No use of accessory muscles of respiration.  CARDIOVASCULAR: S1, S2 normal. No murmurs, rubs, or gallops.  ABDOMEN: Soft, nontender, nondistended. Bowel sounds present. No organomegaly or mass.  EXTREMITIES: No cyanosis, clubbing or edema b/l.    Tenderness in the lower lumbar spine NEUROLOGIC: Cranial nerves II through XII are intact. No focal Motor or sensory deficits b/l.   PSYCHIATRIC: The patient is alert and oriented x 3.  SKIN: No obvious rash, lesion, or ulcer.   LABORATORY PANEL:   CBC Recent Labs  Lab 07/10/18 0408  WBC 12.4*  HGB 14.1  HCT 41.8  PLT 277   ------------------------------------------------------------------------------------------------------------------ Chemistries  Recent Labs  Lab 07/08/18 1725 07/10/18 0408  NA 131* 133*  K 3.7 3.8  CL 95* 98  CO2 22 27  GLUCOSE 174* 117*  BUN 33* 42*  CREATININE 0.95 1.07*  CALCIUM 9.2 8.8*  AST 45*  --   ALT 37  --   ALKPHOS 80  --   BILITOT 1.4*  --    ------------------------------------------------------------------------------------------------------------------  Cardiac Enzymes Recent Labs  Lab 07/08/18 2139  TROPONINI <0.03   ------------------------------------------------------------------------------------------------------------------  RADIOLOGY:  Dg Chest 2 View  Result Date: 07/08/2018 CLINICAL DATA:  Shortness of breath.  Evaluate  for pneumonia EXAM: CHEST - 2 VIEW COMPARISON:  None FINDINGS: Mild cardiac enlargement. Both lungs are clear. The visualized skeletal structures are  unremarkable. IMPRESSION: No active cardiopulmonary disease. Electronically Signed   By: Kerby Moors M.D.   On: 07/08/2018 22:57   Dg Abdomen 1 View  Result Date: 07/09/2018 CLINICAL DATA:  Patient reports abdominal discomfort and constipation over past week. Reports last BM was 4-5 days ago. Hx tubal ligation, cholecystectomy. EXAM: ABDOMEN - 1 VIEW COMPARISON:  None. FINDINGS: Normal bowel gas pattern.  No significant increase in colonic stool. No evidence of renal or ureteral stones. Tubal ligation clips are noted in the pelvis. Soft tissues otherwise unremarkable. No acute skeletal abnormality. IMPRESSION: 1. No acute findings.  No evidence of bowel obstruction. 2. No significant increase in colonic stool. Electronically Signed   By: Lajean Manes M.D.   On: 07/09/2018 11:06   Mr Lumbar Spine Wo Contrast  Result Date: 07/08/2018 CLINICAL DATA:  Lumbar radiculopathy, risk factors (osteoporosis or chronic steroid use or elderly.) Bilateral leg pain for several weeks with sudden increased today. Pain is now greater than 10/10. EXAM: MRI LUMBAR SPINE WITHOUT CONTRAST TECHNIQUE: Multiplanar, multisequence MR imaging of the lumbar spine was performed. No intravenous contrast was administered. COMPARISON:  None. FINDINGS: Segmentation: 5 non rib-bearing lumbar type vertebral bodies are present. Marrow signal and vertebral body heights are normal. Alignment: Slight retrolisthesis at L2-3 and L3-4. Trace anterolisthesis is present at L4-5. Grade 1 anterolisthesis is present at L5-S1. Leftward curvature is centered at L4. Vertebrae:  Marrow signal and vertebral body heights are normal. Conus medullaris and cauda equina: Conus extends to the L1 level. Conus and cauda equina appear normal. Paraspinal and other soft tissues: Limited imaging of the abdomen is unremarkable. Paraspinous soft tissues are within normal limits. Disc levels: T12-L1: Negative. L1-2: Negative. L2-3: A left paramedian disc protrusion is  present. Mild left subarticular narrowing is present. The foramina are patent. L3-4: A broad-based disc protrusion is present. Disc extends into both neural foramina. Central canal is patent. Mild right foraminal narrowing is present. L4-5: A broad-based disc protrusion is present. Moderate facet hypertrophy is noted bilaterally. A left synovial cyst contributes to mild left subarticular narrowing. Mild foraminal narrowing bilaterally is worse on the left. L5-S1: Moderate facet hypertrophy is present bilaterally. There is uncovering of a broad-based disc protrusion. The central canal and foramina patent. IMPRESSION: 1. Multilevel spondylosis of the lumbar spine with mild leftward curvature at L4-5. 2. Mild left subarticular narrowing at L2-3. 3. Mild right foraminal stenosis at L3-4. 4. Mild left subarticular and left greater than right foraminal narrowing at L4-5. 5. Grade 1 anterolisthesis at L5-S1 without significant stenosis. Electronically Signed   By: San Morelle M.D.   On: 07/08/2018 18:31     ASSESSMENT AND PLAN:  77 year old female patient with history of hypertension, macular degeneration, osteoarthritis currently under hospitalist service for right-sided lumbar lower back pain with radiation of the pain to the right lower extremity  -Lumbar spine spondylosis Lower back pain secondary to spondylosis of the spine Continue gabapentin 300 mg orally 3 times daily Add oral Percocet 1 to 2 tablets every 4 hourly as needed for pain Continue muscle relaxant Flexeril IV morphine for breakthrough pain Physical therapy evaluation Outpatient epidural steroid injections and referral to spine surgeon outpatient  -Ambulatory dysfunction secondary to lumbar spine spondylosis Physical therapy evaluation  -GERD continue PPI  -Restless leg syndrome Continue Mirapex  -Leukocytosis secondary to steroids improving   All the  records are reviewed and case discussed with Care Management/Social  Worker. Management plans discussed with the patient, family and they are in agreement.  CODE STATUS: Full code  DVT Prophylaxis: SCDs  TOTAL TIME TAKING CARE OF THIS PATIENT: 35 minutes.   POSSIBLE D/C IN 1 DAYS, DEPENDING ON CLINICAL CONDITION.  Saundra Shelling M.D on 07/10/2018 at 11:24 AM  Between 7am to 6pm - Pager - [phone]  After 6pm go to [email or web address] - password [email or web address] [facility]  Office  [phone]  CC: Primary care physician; Virginia Crews, MD  Note: This dictation was prepared with Dragon dictation along with smaller phrase technology. Any transcriptional errors that result from this process are unintentional.

## 2018-07-10 NOTE — Progress Notes (Signed)
Clinical Social Worker (CSW) met with patient and made her aware that she is under medicare observation. CSW explained that medicare will not pay for SNF unless she has a 3 night qualifying inpatient stay in a hospital. CSW explained that options are home health or private pay for SNF. Per patient she will go home and can't private pay for SNF. RN case manager is aware of above.   [employer], LCSW [phone]

## 2018-07-10 NOTE — Progress Notes (Signed)
Advanced care plan.  Purpose of the Encounter: CODE STATUS  Parties in Attendance: Patient and family  Patient's Decision Capacity: Good  Subjective/Patient's story: Presented to emergency room for lower back pain with radiation of pain in the right leg   Objective/Medical story Has multilevel lumbar spine spondylosis Needs pain management, muscle relaxant and physical therapy   Goals of care determination:  Advance care directives goals of care and treatment plan discussed Patient and family want everything done which includes CPR, intubation if the need arises   CODE STATUS: Full code   Time spent discussing advanced care planning: 16 minutes

## 2018-07-11 ENCOUNTER — Telehealth: Payer: Self-pay

## 2018-07-11 DIAGNOSIS — I10 Essential (primary) hypertension: Secondary | ICD-10-CM | POA: Diagnosis not present

## 2018-07-11 DIAGNOSIS — G8929 Other chronic pain: Secondary | ICD-10-CM | POA: Diagnosis not present

## 2018-07-11 DIAGNOSIS — M549 Dorsalgia, unspecified: Secondary | ICD-10-CM | POA: Diagnosis not present

## 2018-07-11 DIAGNOSIS — K219 Gastro-esophageal reflux disease without esophagitis: Secondary | ICD-10-CM | POA: Diagnosis not present

## 2018-07-11 DIAGNOSIS — M47816 Spondylosis without myelopathy or radiculopathy, lumbar region: Secondary | ICD-10-CM | POA: Diagnosis not present

## 2018-07-11 DIAGNOSIS — M545 Low back pain: Secondary | ICD-10-CM | POA: Diagnosis not present

## 2018-07-11 DIAGNOSIS — R262 Difficulty in walking, not elsewhere classified: Secondary | ICD-10-CM | POA: Diagnosis not present

## 2018-07-11 DIAGNOSIS — K5900 Constipation, unspecified: Secondary | ICD-10-CM | POA: Diagnosis not present

## 2018-07-11 MED ORDER — OXYCODONE-ACETAMINOPHEN 5-325 MG PO TABS: 1.0000 | ORAL_TABLET | Freq: Four times a day (QID) | ORAL | 0 refills | Status: AC | PRN

## 2018-07-11 MED ORDER — GABAPENTIN 300 MG PO CAPS: 300.0000 mg | ORAL_CAPSULE | Freq: Three times a day (TID) | ORAL | 0 refills | Status: DC

## 2018-07-11 NOTE — Progress Notes (Signed)
Patient ready for discharge home with Son. Reviewed all discharge instructions including f/u appointments and prescriptions.

## 2018-07-11 NOTE — Discharge Summary (Signed)
[facility] [place] NAME: Gloria Mercer    MR#:  [id number]  DATE OF BIRTH:  04-27-1941  DATE OF ADMISSION:  07/08/2018 ADMITTING PHYSICIAN: Sela Hua, MD  DATE OF DISCHARGE: 07/11/2018  PRIMARY CARE PHYSICIAN: Virginia Crews, MD   ADMISSION DIAGNOSIS:  Chronic midline low back pain with bilateral sciatica [M54.41, M54.42, G89.29] Chest pain, unspecified type [R07.9] Lumbar spine spondylosis Ambulatory dysfunction secondary to lower back pain Restless leg syndrome DISCHARGE DIAGNOSIS:  Active Problems:   Intractable back pain Lumbar spine spondylosis Ambulatory dysfunction GERD Restless leg syndrome  SECONDARY DIAGNOSIS:   Past Medical History:  Diagnosis Date  . Allergic rhinitis    on allergy shots, sees Dr. Pryor Ochoa  . Cataract   . GERD (gastroesophageal reflux disease)   . Hypertension   . Macular degeneration    followed by Dr Garwin Brothers at [facility]  . Osteoarthritis   . Sciatica      ADMITTING HISTORY Gloria Mercer  is a 77 y.o. female with a known history of HTN, macular degeneration, and osteoarthritis who presented to the ED with right-sided low back pain and right-sided leg pain for the last 2 weeks.  The pain radiates down the inside and outside of her right leg.  The pain feels like a "burning" and "sharp" pain.  She has been seeing her PCP for this and has been prescribed prednisone, flexeril, and percocet, which have helped a little.  She has been in the ED for 24 hours.  She is unable to walk or stand due to pain.  Her family cannot lift her to get her into the car.  She has been seen by social work who is trying to get her set up for home health services, but family still has concerns about her ability to go home.  Her family is unable to pay for SNF.  At baseline, she is able to walk independently without a walker.  She is able to complete all of her ADLs.  Due to her intractable back pain, she was admitted for  further management.  HOSPITAL COURSE:  Patient was admitted to medical floor.  She she was worked up with abdominal x-ray which did not show any acute abnormality.  Patient was worked up with MRI lumbar spine which showed multilevel spondylosis of the lumbar spine at L4-L5 mild foraminal stenosis at L3-L4 with.  Patient received pain management during the hospitalization along with occupational therapy and physical therapy services.  Patient pain was relieved with gabapentin, Roxicet medication along with muscle relaxant Flexeril.  Patient tolerated physical therapy well will be discharged home with home physical therapy.  No bowel bladder incontinence.  Follow-up with primary care physician in the clinic and if the pain persists in the future in spite of above therapies, she is recommended to follow-up with spine surgeon for epidural injections for relief of pain.  CONSULTS OBTAINED:  None  DRUG ALLERGIES:   Allergies  Allergen Reactions  . Erythromycin Other (See Comments)    Other reaction(s): Headache Headache and GI upset Headache and GI upset  . Losartan Nausea And Vomiting  . Metoprolol Nausea And Vomiting  . Simvastatin Other (See Comments)    Other reaction(s): Muscle Pain Pain lower extremities Pain lower extremities  . Dexlansoprazole Diarrhea  . Lisinopril Cough  . Prednisone     Other reaction(s): Constipation GI upset    DISCHARGE MEDICATIONS:   Allergies as of 07/11/2018      Reactions  Erythromycin Other (See Comments)   Other reaction(s): Headache Headache and GI upset Headache and GI upset   Losartan Nausea And Vomiting   Metoprolol Nausea And Vomiting   Simvastatin Other (See Comments)   Other reaction(s): Muscle Pain Pain lower extremities Pain lower extremities   Dexlansoprazole Diarrhea   Lisinopril Cough   Prednisone    Other reaction(s): Constipation GI upset      Medication List    STOP taking these medications   predniSONE 20 MG  tablet Commonly known as:  DELTASONE     TAKE these medications   amLODipine 5 MG tablet Commonly known as:  NORVASC TAKE 1 TABLET(5 MG) BY MOUTH DAILY   azelastine 0.1 % nasal spray Commonly known as:  ASTELIN Place 2 sprays into both nostrils daily.   celecoxib 200 MG capsule Commonly known as:  CELEBREX Take 1 capsule (200 mg total) by mouth daily.   clotrimazole-betamethasone cream Commonly known as:  LOTRISONE Apply 1 application topically 2 (two) times daily as needed.   cyclobenzaprine 5 MG tablet Commonly known as:  FLEXERIL Take 1 tablet (5 mg total) by mouth 3 (three) times daily as needed for muscle spasms.   esomeprazole 20 MG capsule Commonly known as:  NEXIUM Take 20 mg by mouth daily at 12 noon.   EYE VITAMINS PO Take by mouth.   Fish Oil 1000 MG Caps Take 1 capsule by mouth daily.   gabapentin 300 MG capsule Commonly known as:  NEURONTIN Take 1 capsule (300 mg total) by mouth 3 (three) times daily for 14 days.   ipratropium 0.03 % nasal spray Commonly known as:  ATROVENT Place 2 sprays into both nostrils every 8 (eight) hours as needed.   lidocaine 5 % Commonly known as:  LIDODERM Place 1 patch onto the skin daily. Remove & Discard patch within 12 hours or as directed by MD   loratadine 10 MG tablet Commonly known as:  CLARITIN Take 2 tablets by mouth daily.   montelukast 10 MG tablet Commonly known as:  SINGULAIR Take 1 tablet by mouth daily.   NASACORT ALLERGY 24HR 55 MCG/ACT Aero nasal inhaler Generic drug:  triamcinolone Place 2 sprays into the nose daily.   oxyCODONE-acetaminophen 5-325 MG tablet Commonly known as:  PERCOCET/ROXICET Take 1 tablet by mouth every 6 (six) hours as needed for up to 5 days for moderate pain or severe pain. What changed:  reasons to take this   pramipexole 0.125 MG tablet Commonly known as:  MIRAPEX TAKE 4 TABLETS(0.5 MG) BY MOUTH AT BEDTIME   senna-docusate 8.6-50 MG tablet Commonly known as:   Senokot-S Take 1-2 tablets by mouth 2 (two) times daily.   triamterene-hydrochlorothiazide 37.5-25 MG tablet Commonly known as:  MAXZIDE-25 Take 1 tablet by mouth daily.   Vitamin D3 5000 units Caps Take 1 capsule by mouth daily.       Today  Patient seen and evaluated today Decreased back pain Ambulating okay Tolerating diet well  VITAL SIGNS:  Blood pressure 140/71, pulse 82, temperature (!) 97.5 F (36.4 C), temperature source Oral, resp. rate 18, height 5\' 3"  (1.6 m), weight 103 kg, SpO2 98 %.  I/O:    Intake/Output Summary (Last 24 hours) at 07/11/2018 1120 Last data filed at 07/11/2018 0900 Gross per 24 hour  Intake 720 ml  Output -  Net 720 ml    PHYSICAL EXAMINATION:  Physical Exam  GENERAL:  77 y.o.-year-old patient lying in the bed with no acute distress.  LUNGS: Normal  breath sounds bilaterally, no wheezing, rales,rhonchi or crepitation. No use of accessory muscles of respiration.  CARDIOVASCULAR: S1, S2 normal. No murmurs, rubs, or gallops.  ABDOMEN: Soft, non-tender, non-distended. Bowel sounds present. No organomegaly or mass.  NEUROLOGIC: Moves all 4 extremities. PSYCHIATRIC: The patient is alert and oriented x 3.  SKIN: No obvious rash, lesion, or ulcer.   DATA REVIEW:   CBC Recent Labs  Lab 07/10/18 0408  WBC 12.4*  HGB 14.1  HCT 41.8  PLT 277    Chemistries  Recent Labs  Lab 07/08/18 1725 07/10/18 0408  NA 131* 133*  K 3.7 3.8  CL 95* 98  CO2 22 27  GLUCOSE 174* 117*  BUN 33* 42*  CREATININE 0.95 1.07*  CALCIUM 9.2 8.8*  AST 45*  --   ALT 37  --   ALKPHOS 80  --   BILITOT 1.4*  --     Cardiac Enzymes Recent Labs  Lab 07/08/18 2139  TROPONINI <0.03    Microbiology Results  No results found for this or any previous visit.  RADIOLOGY:  No results found.  Follow up with PCP in 1 week.  Management plans discussed with the patient, family and they are in agreement.  CODE STATUS: Full code    Code Status Orders   (From admission, onward)         Start     Ordered   07/09/18 1654  Full code  Continuous     07/09/18 1653        Code Status History    This patient has a current code status but no historical code status.    Advance Directive Documentation     Most Recent Value  Type of Advance Directive  Healthcare Power of Attorney  Pre-existing out of facility DNR order (yellow form or pink MOST form)  -  "MOST" Form in Place?  -      TOTAL TIME TAKING CARE OF THIS PATIENT ON DAY OF DISCHARGE: more than 34 minutes.   Saundra Shelling M.D on 07/11/2018 at 11:20 AM  Between 7am to 6pm - Pager - [phone]  After 6pm go to [email or web address] - password [email or web address] [facility]  Office  [phone]  CC: Primary care physician; Virginia Crews, MD  Note: This dictation was prepared with Dragon dictation along with smaller phrase technology. Any transcriptional errors that result from this process are unintentional.

## 2018-07-11 NOTE — Care Management Note (Signed)
Case Management Note  Patient Details  Name: Gloria Mercer MRN: [id number] Date of Birth: 1941/09/19  Subjective/Objective:  Discharging today                  Action/Plan: Advanced made aware of discharge.   Expected Discharge Date:  07/11/18               Expected Discharge Plan:  [place]  In-House Referral:     Discharge planning Services  CM Consult  Post Acute Care Choice:  Home Health Choice offered to:  Patient  DME Arranged:    DME Agency:     HH Arranged:  PT, Nurse's Aide, OT HH Agency:  [facility]  Status of Service:  Completed, signed off  If discussed at [place] of Stay Meetings, dates discussed:    Additional Comments:  Jolly Mango, RN 07/11/2018, 9:17 AM

## 2018-07-11 NOTE — Plan of Care (Signed)

## 2018-07-12 ENCOUNTER — Telehealth: Payer: Self-pay | Admitting: Physician Assistant

## 2018-07-12 NOTE — Telephone Encounter (Signed)
[employer] has denied Lidocaine for patient due to adverse affects with other medications.

## 2018-07-12 NOTE — Telephone Encounter (Signed)
[employer] called back stating Cyclobenzaprine has been approved for 1 year starting today.

## 2018-07-12 NOTE — Telephone Encounter (Signed)
Transition Care Management Follow-up Telephone Call  Date of discharge and from where: [facility] on 07/11/18.  How have you been since you were released from the hospital? Doing ok, still having pain in her back that radiates down the right leg. Pain is not as bad as previously. Declines any other new or worsening s/s.  Any questions or concerns? No   Items Reviewed:  Did the pt receive and understand the discharge instructions provided? Yes   Medications obtained and verified? No , pt declined reviewing these at this time.   Any new allergies since your discharge? No   Dietary orders reviewed? Yes  Do you have support at home? Yes   Other (ie: DME, Home Health, etc) Home health- unsure which company. Pt has not received a call yet.  Functional Questionnaire: (I = Independent and D = Dependent) ADL's: Cane and walker  Bathing/Dressing- I   Meal Prep- I  Eating- I  Maintaining continence- I  Transferring/Ambulation- Uses a cane and walker for assistance with walking.   Managing Meds- I   Follow up appointments reviewed:    PCP Hospital f/u appt confirmed? Yes  Scheduled to see Dr Brita Romp on 07/20/18 @ 11:00 AM.  [facility] f/u appt confirmed? N/A  Are transportation arrangements needed? No   If their condition worsens, is the pt aware to call  their PCP or go to the ED? Yes  Was the patient provided with contact information for the PCP's office or ED? Yes  Was the pt encouraged to call back with questions or concerns? Yes

## 2018-07-12 NOTE — Telephone Encounter (Signed)
PA form faxed back to [place] stating medication was approved.

## 2018-07-18 ENCOUNTER — Telehealth: Payer: Self-pay | Admitting: Family Medicine

## 2018-07-18 DIAGNOSIS — G8929 Other chronic pain: Secondary | ICD-10-CM | POA: Diagnosis not present

## 2018-07-18 DIAGNOSIS — Z79899 Other long term (current) drug therapy: Secondary | ICD-10-CM | POA: Diagnosis not present

## 2018-07-18 DIAGNOSIS — N183 Chronic kidney disease, stage 3 (moderate): Secondary | ICD-10-CM | POA: Diagnosis not present

## 2018-07-18 DIAGNOSIS — R2689 Other abnormalities of gait and mobility: Secondary | ICD-10-CM | POA: Diagnosis not present

## 2018-07-18 DIAGNOSIS — E669 Obesity, unspecified: Secondary | ICD-10-CM | POA: Diagnosis not present

## 2018-07-18 DIAGNOSIS — G2581 Restless legs syndrome: Secondary | ICD-10-CM | POA: Diagnosis not present

## 2018-07-18 DIAGNOSIS — M170 Bilateral primary osteoarthritis of knee: Secondary | ICD-10-CM | POA: Diagnosis not present

## 2018-07-18 DIAGNOSIS — M48061 Spinal stenosis, lumbar region without neurogenic claudication: Secondary | ICD-10-CM | POA: Diagnosis not present

## 2018-07-18 DIAGNOSIS — M4726 Other spondylosis with radiculopathy, lumbar region: Secondary | ICD-10-CM | POA: Diagnosis not present

## 2018-07-18 DIAGNOSIS — I1310 Hypertensive heart and chronic kidney disease without heart failure, with stage 1 through stage 4 chronic kidney disease, or unspecified chronic kidney disease: Secondary | ICD-10-CM | POA: Diagnosis not present

## 2018-07-18 DIAGNOSIS — Z79891 Long term (current) use of opiate analgesic: Secondary | ICD-10-CM | POA: Diagnosis not present

## 2018-07-18 DIAGNOSIS — Z87891 Personal history of nicotine dependence: Secondary | ICD-10-CM | POA: Diagnosis not present

## 2018-07-18 NOTE — Telephone Encounter (Signed)
Gloria Mercer with [facility] called saying she needs verbal orders for PT  2 times a week for 4 weeks   CB# is [phone]  Thank s  Con Memos

## 2018-07-19 NOTE — Telephone Encounter (Signed)
Okay for verbal orders  Brita Romp Dionne Bucy, MD, MPH [facility] 07/19/2018 11:02 AM

## 2018-07-19 NOTE — Telephone Encounter (Signed)
Gloria Mercer was given verbal orders.

## 2018-07-20 ENCOUNTER — Ambulatory Visit (INDEPENDENT_AMBULATORY_CARE_PROVIDER_SITE_OTHER): Payer: Medicare Other | Admitting: Family Medicine

## 2018-07-20 ENCOUNTER — Encounter: Payer: Self-pay | Admitting: Family Medicine

## 2018-07-20 VITALS — BP 142/84 | HR 99 | Temp 98.0°F | Resp 16 | Wt 226.0 lb

## 2018-07-20 DIAGNOSIS — M549 Dorsalgia, unspecified: Secondary | ICD-10-CM

## 2018-07-20 MED ORDER — GABAPENTIN 300 MG PO CAPS: 300.0000 mg | ORAL_CAPSULE | Freq: Three times a day (TID) | ORAL | 1 refills | Status: DC

## 2018-07-20 MED ORDER — OXYCODONE-ACETAMINOPHEN 5-325 MG PO TABS: 1.0000 | ORAL_TABLET | Freq: Four times a day (QID) | ORAL | 0 refills | Status: AC | PRN

## 2018-07-20 NOTE — Progress Notes (Signed)
Patient: Gloria Mercer Female    DOB: 1941-11-24   77 y.o.   MRN: [id number] Visit Date: 07/20/2018  Today's Provider: Lavon Paganini, MD   I, Martha Clan, CMA, am acting as scribe for Lavon Paganini, MD.  Chief Complaint  Patient presents with  . Hospitalization Follow-up   Subjective:    HPI     Follow up Hospitalization  Patient was admitted to [facility] on 07/09/2018 and discharged on 07/11/2018. She was treated for back pain.  MRI showed multilevel spondylosis of the lumbar spine with mild leftward curvature at L4-L5, mild left subarticular narrowing at L2-L3, mild right foraminal stenosis at L3-L4, mild left subarticular and left> right foraminal narrowing at L4-L5, grade 1 anterior listhesis at L5-S1 without significant stenosis. Treatment for this included starting gabapentin. Pt was also advised to take oxycodone-APAP 5-325 mg. She continues to take her muscle relaxer as well. Her lidocaine patches were not covered by insurance. Telephone follow up was done on 07/11/2018 She reports good compliance with treatment. She reports this condition is Improved.  Thinks she is ~85% improved.  Pt also as PT coming into the home, as well as OT. ------------------------------------------------------------------------------------     Allergies  Allergen Reactions  . Erythromycin Other (See Comments)    Other reaction(s): Headache Headache and GI upset Headache and GI upset  . Losartan Nausea And Vomiting  . Metoprolol Nausea And Vomiting  . Simvastatin Other (See Comments)    Other reaction(s): Muscle Pain Pain lower extremities Pain lower extremities  . Dexlansoprazole Diarrhea  . Lisinopril Cough  . Prednisone     Other reaction(s): Constipation GI upset     Current Outpatient Medications:  .  amLODipine (NORVASC) 5 MG tablet, TAKE 1 TABLET(5 MG) BY MOUTH DAILY, Disp: 30 tablet, Rfl: 5 .  azelastine (ASTELIN) 0.1 % nasal spray, Place 2 sprays into  both nostrils daily., Disp: , Rfl:  .  celecoxib (CELEBREX) 200 MG capsule, Take 1 capsule (200 mg total) by mouth daily., Disp: 30 capsule, Rfl: 2 .  Cholecalciferol (VITAMIN D3) 5000 units CAPS, Take 1 capsule by mouth daily., Disp: , Rfl:  .  clotrimazole-betamethasone (LOTRISONE) cream, Apply 1 application topically 2 (two) times daily as needed., Disp: 60 g, Rfl: 1 .  cyclobenzaprine (FLEXERIL) 5 MG tablet, Take 1 tablet (5 mg total) by mouth 3 (three) times daily as needed for muscle spasms., Disp: 30 tablet, Rfl: 1 .  esomeprazole (NEXIUM) 20 MG capsule, Take 20 mg by mouth daily at 12 noon., Disp: , Rfl:  .  gabapentin (NEURONTIN) 300 MG capsule, Take 1 capsule (300 mg total) by mouth 3 (three) times daily for 14 days., Disp: 42 capsule, Rfl: 0 .  ipratropium (ATROVENT) 0.03 % nasal spray, Place 2 sprays into both nostrils every 8 (eight) hours as needed., Disp: , Rfl: 12 .  lidocaine (LIDODERM) 5 %, Place 1 patch onto the skin daily. Remove & Discard patch within 12 hours or as directed by MD, Disp: 30 patch, Rfl: 0 .  loratadine (CLARITIN) 10 MG tablet, Take 2 tablets by mouth daily., Disp: , Rfl:  .  montelukast (SINGULAIR) 10 MG tablet, Take 1 tablet by mouth daily., Disp: , Rfl:  .  Multiple Vitamins-Minerals (EYE VITAMINS PO), Take by mouth., Disp: , Rfl:  .  Omega-3 Fatty Acids (FISH OIL) 1000 MG CAPS, Take 1 capsule by mouth daily., Disp: , Rfl:  .  oxyCODONE-acetaminophen (PERCOCET/ROXICET) 5-325 MG tablet, Take by mouth every 4 (  four) hours as needed for severe pain., Disp: , Rfl:  .  pramipexole (MIRAPEX) 0.125 MG tablet, TAKE 4 TABLETS(0.5 MG) BY MOUTH AT BEDTIME, Disp: 120 tablet, Rfl: 3 .  senna-docusate (SENOKOT-S) 8.6-50 MG tablet, Take 1-2 tablets by mouth 2 (two) times daily., Disp: 60 tablet, Rfl: 1 .  triamcinolone (NASACORT ALLERGY 24HR) 55 MCG/ACT AERO nasal inhaler, Place 2 sprays into the nose daily., Disp: , Rfl:  .  triamterene-hydrochlorothiazide (MAXZIDE-25)  37.5-25 MG tablet, Take 1 tablet by mouth daily., Disp: 90 tablet, Rfl: 2  Review of Systems  Constitutional: Positive for activity change (is having at home PT) and fatigue. Negative for appetite change, chills, diaphoresis, fever and unexpected weight change.  Respiratory: Positive for shortness of breath (allergies).   Cardiovascular: Positive for leg swelling. Negative for chest pain and palpitations.  Musculoskeletal: Positive for back pain (controlled on the medication), gait problem and joint swelling.    Social History   Tobacco Use  . Smoking status: Former Smoker    Packs/day: 1.00    Years: 10.00    Pack years: 10.00    Types: Cigarettes    Last attempt to quit: 11/28/1976    Years since quitting: 41.6  . Smokeless tobacco: Never Used  Substance Use Topics  . Alcohol use: Yes    Comment: rarely, at holidays   Objective:   BP (!) 142/84 (BP Location: Left Wrist, Patient Position: Sitting, Cuff Size: Normal)   Pulse 99   Temp 98 F (36.7 C) (Oral)   Resp 16   Wt 226 lb (102.5 kg)   SpO2 95%   BMI 40.03 kg/m  Vitals:   07/20/18 1115  BP: (!) 142/84  Pulse: 99  Resp: 16  Temp: 98 F (36.7 C)  TempSrc: Oral  SpO2: 95%  Weight: 226 lb (102.5 kg)     Physical Exam  Constitutional: She is oriented to person, place, and time. She appears well-developed and well-nourished. No distress.  HENT:  Head: Normocephalic and atraumatic.  Eyes: Conjunctivae are normal. No scleral icterus.  Neck: Neck supple. No thyromegaly present.  Cardiovascular: Normal rate, regular rhythm, normal heart sounds and intact distal pulses.  No murmur heard. Pulmonary/Chest: Effort normal and breath sounds normal. No respiratory distress. She has no wheezes. She has no rales.  Musculoskeletal: She exhibits no edema.  Back: No midline spinous process tenderness to palpation.  No paraspinal musculature tenderness to palpation.  No SI joint tenderness palpation.  Negative straight leg  raise bilaterally.  Strength is intact and symmetric in bilateral lower extremities.  Sensation is intact grossly to light touch.  Lymphadenopathy:    She has no cervical adenopathy.  Neurological: She is alert and oriented to person, place, and time.  Skin: Skin is warm and dry. Capillary refill takes less than 2 seconds. No rash noted.  Psychiatric: She has a normal mood and affect. Her behavior is normal.  Vitals reviewed.       Assessment & Plan:   Problem List Items Addressed This Visit      Other   Intractable back pain - Primary    Improving Continue anti-inflammatories, Flexeril, gabapentin Can use narcotics very sparingly Discussed that we will not continue to fill for chronic narcotics Continue physical therapy and Occupational Therapy No radicular symptoms at this time to suggest to that ESI would be helpful, but we can consider this in the future if she worsens again Return precautions discussed      Relevant Medications  oxyCODONE-acetaminophen (PERCOCET/ROXICET) 5-325 MG tablet       Return if symptoms worsen or fail to improve.   The entirety of the information documented in the History of Present Illness, Review of Systems and Physical Exam were personally obtained by me. Portions of this information were initially documented by Raquel Sarna Ratchford, CMA and reviewed by me for thoroughness and accuracy.    Virginia Crews, MD, MPH [facility] 07/21/2018 9:22 AM

## 2018-07-21 ENCOUNTER — Telehealth: Payer: Self-pay | Admitting: Family Medicine

## 2018-07-21 DIAGNOSIS — M170 Bilateral primary osteoarthritis of knee: Secondary | ICD-10-CM | POA: Diagnosis not present

## 2018-07-21 DIAGNOSIS — R2689 Other abnormalities of gait and mobility: Secondary | ICD-10-CM | POA: Diagnosis not present

## 2018-07-21 DIAGNOSIS — M48061 Spinal stenosis, lumbar region without neurogenic claudication: Secondary | ICD-10-CM | POA: Diagnosis not present

## 2018-07-21 DIAGNOSIS — G8929 Other chronic pain: Secondary | ICD-10-CM | POA: Diagnosis not present

## 2018-07-21 DIAGNOSIS — M4726 Other spondylosis with radiculopathy, lumbar region: Secondary | ICD-10-CM | POA: Diagnosis not present

## 2018-07-21 DIAGNOSIS — I1310 Hypertensive heart and chronic kidney disease without heart failure, with stage 1 through stage 4 chronic kidney disease, or unspecified chronic kidney disease: Secondary | ICD-10-CM | POA: Diagnosis not present

## 2018-07-21 NOTE — Assessment & Plan Note (Signed)
Improving Continue anti-inflammatories, Flexeril, gabapentin Can use narcotics very sparingly Discussed that we will not continue to fill for chronic narcotics Continue physical therapy and Occupational Therapy No radicular symptoms at this time to suggest to that [facility] would be helpful, but we can consider this in the future if she worsens again Return precautions discussed

## 2018-07-21 NOTE — Telephone Encounter (Signed)
OK for verbals 

## 2018-07-21 NOTE — Telephone Encounter (Signed)
Yes.  Ok for verbal  Virginia Crews, MD, MPH [facility] 07/21/2018 4:51 PM

## 2018-07-21 NOTE — Telephone Encounter (Signed)
Gloria Mercer with [facility] needs a verbal ok for OT for 2 visits on week one to train for shower transfers, showering and bathing.

## 2018-07-24 NOTE — Telephone Encounter (Signed)
Verbal orders given to [facility] with [facility]

## 2018-07-25 ENCOUNTER — Telehealth: Payer: Self-pay | Admitting: Family Medicine

## 2018-07-25 NOTE — Telephone Encounter (Signed)
Gloria Mercer with [facility] is requesting verbal orders for in home occupational therapy as follows:  Twice a week for 1 week. This week.  Please advise. Thanks TNP

## 2018-07-26 ENCOUNTER — Other Ambulatory Visit: Payer: Self-pay

## 2018-07-26 MED ORDER — GABAPENTIN 300 MG PO CAPS: 300.0000 mg | ORAL_CAPSULE | Freq: Three times a day (TID) | ORAL | 1 refills | Status: DC

## 2018-07-26 NOTE — Telephone Encounter (Signed)
Okay for verbal orders  Brita Romp Dionne Bucy, MD, MPH [facility] 07/26/2018 9:38 AM

## 2018-07-26 NOTE — Telephone Encounter (Signed)
Left message for [facility] with [place]  with verbal orders.

## 2018-07-27 ENCOUNTER — Telehealth: Payer: Self-pay | Admitting: Family Medicine

## 2018-07-27 NOTE — Telephone Encounter (Signed)
Verbal order given to [facility] with [facility]

## 2018-07-27 NOTE — Telephone Encounter (Signed)
Requesting OT change, pt cxed 2 visits this week due to not feeling well.  OT is requesting an extension for two visits for next week.

## 2018-07-27 NOTE — Telephone Encounter (Signed)
OK for verbal  Gloria Crews, MD, MPH [facility] 07/27/2018 10:28 AM

## 2018-07-28 ENCOUNTER — Telehealth: Payer: Self-pay | Admitting: Family Medicine

## 2018-07-28 NOTE — Telephone Encounter (Signed)
Noted. Sounds good to me  [name], Dionne Bucy, MD, MPH [facility] 07/28/2018 2:56 PM

## 2018-07-28 NOTE — Telephone Encounter (Signed)
Janelle with [facility] stated that pt has missed 2 home visits this week due to her sister is passing. [place] was to let Dr. B know they are going to place pt on hold and will contact her next Friday 08/04/18 to discuss restarting care. Please advise. Thanks TNP

## 2018-08-03 ENCOUNTER — Telehealth: Payer: Self-pay | Admitting: Family Medicine

## 2018-08-03 DIAGNOSIS — G8929 Other chronic pain: Secondary | ICD-10-CM | POA: Diagnosis not present

## 2018-08-03 DIAGNOSIS — J301 Allergic rhinitis due to pollen: Secondary | ICD-10-CM | POA: Diagnosis not present

## 2018-08-03 DIAGNOSIS — M170 Bilateral primary osteoarthritis of knee: Secondary | ICD-10-CM | POA: Diagnosis not present

## 2018-08-03 DIAGNOSIS — M48061 Spinal stenosis, lumbar region without neurogenic claudication: Secondary | ICD-10-CM | POA: Diagnosis not present

## 2018-08-03 DIAGNOSIS — M4726 Other spondylosis with radiculopathy, lumbar region: Secondary | ICD-10-CM | POA: Diagnosis not present

## 2018-08-03 DIAGNOSIS — R2689 Other abnormalities of gait and mobility: Secondary | ICD-10-CM | POA: Diagnosis not present

## 2018-08-03 DIAGNOSIS — I1310 Hypertensive heart and chronic kidney disease without heart failure, with stage 1 through stage 4 chronic kidney disease, or unspecified chronic kidney disease: Secondary | ICD-10-CM | POA: Diagnosis not present

## 2018-08-03 NOTE — Telephone Encounter (Signed)
Gloria Mercer with [facility] called asking for verbal ok on 3 missed appts for PT..  The call back is [phone] or [phone]  [facility]

## 2018-08-03 NOTE — Telephone Encounter (Signed)
OK for verbal  Virginia Crews, MD, MPH [facility] 08/03/2018 6:21 PM

## 2018-08-04 ENCOUNTER — Telehealth: Payer: Self-pay | Admitting: Family Medicine

## 2018-08-04 DIAGNOSIS — I1310 Hypertensive heart and chronic kidney disease without heart failure, with stage 1 through stage 4 chronic kidney disease, or unspecified chronic kidney disease: Secondary | ICD-10-CM | POA: Diagnosis not present

## 2018-08-04 DIAGNOSIS — M170 Bilateral primary osteoarthritis of knee: Secondary | ICD-10-CM | POA: Diagnosis not present

## 2018-08-04 DIAGNOSIS — M48061 Spinal stenosis, lumbar region without neurogenic claudication: Secondary | ICD-10-CM | POA: Diagnosis not present

## 2018-08-04 DIAGNOSIS — R2689 Other abnormalities of gait and mobility: Secondary | ICD-10-CM | POA: Diagnosis not present

## 2018-08-04 DIAGNOSIS — G8929 Other chronic pain: Secondary | ICD-10-CM | POA: Diagnosis not present

## 2018-08-04 DIAGNOSIS — M4726 Other spondylosis with radiculopathy, lumbar region: Secondary | ICD-10-CM | POA: Diagnosis not present

## 2018-08-04 NOTE — Telephone Encounter (Signed)
Gloria Mercer was given verbal order.

## 2018-08-04 NOTE — Telephone Encounter (Signed)
[facility] called to tell Dr. Jacinto Reap that Ms. Belsky cancelled her last OT appt.

## 2018-08-07 ENCOUNTER — Telehealth: Payer: Self-pay | Admitting: Family Medicine

## 2018-08-07 NOTE — Telephone Encounter (Signed)
Pt would like to talk with Dr. B about reducing her medications.  Concerning the prescriptions from the hospital  Pt's CB#  [phone]  Thanks  Con Memos

## 2018-08-08 DIAGNOSIS — G8929 Other chronic pain: Secondary | ICD-10-CM | POA: Diagnosis not present

## 2018-08-08 DIAGNOSIS — M4726 Other spondylosis with radiculopathy, lumbar region: Secondary | ICD-10-CM | POA: Diagnosis not present

## 2018-08-08 DIAGNOSIS — M170 Bilateral primary osteoarthritis of knee: Secondary | ICD-10-CM | POA: Diagnosis not present

## 2018-08-08 DIAGNOSIS — M48061 Spinal stenosis, lumbar region without neurogenic claudication: Secondary | ICD-10-CM | POA: Diagnosis not present

## 2018-08-08 DIAGNOSIS — I1310 Hypertensive heart and chronic kidney disease without heart failure, with stage 1 through stage 4 chronic kidney disease, or unspecified chronic kidney disease: Secondary | ICD-10-CM | POA: Diagnosis not present

## 2018-08-08 DIAGNOSIS — R2689 Other abnormalities of gait and mobility: Secondary | ICD-10-CM | POA: Diagnosis not present

## 2018-08-08 NOTE — Telephone Encounter (Signed)
Which meds does she want to reduce?  If she is trying to decrease flexeril and pain meds, she can just use them prn and then stop them.  Gabapentin, she can reduce to 300mg  qhs for 1 wk (from TID) and then stop after that if tolerating.  Virginia Crews, MD, MPH [facility] 08/08/2018 8:50 AM

## 2018-08-09 DIAGNOSIS — J301 Allergic rhinitis due to pollen: Secondary | ICD-10-CM | POA: Diagnosis not present

## 2018-08-11 DIAGNOSIS — M4726 Other spondylosis with radiculopathy, lumbar region: Secondary | ICD-10-CM | POA: Diagnosis not present

## 2018-08-11 DIAGNOSIS — I1310 Hypertensive heart and chronic kidney disease without heart failure, with stage 1 through stage 4 chronic kidney disease, or unspecified chronic kidney disease: Secondary | ICD-10-CM | POA: Diagnosis not present

## 2018-08-11 DIAGNOSIS — M170 Bilateral primary osteoarthritis of knee: Secondary | ICD-10-CM | POA: Diagnosis not present

## 2018-08-11 DIAGNOSIS — G8929 Other chronic pain: Secondary | ICD-10-CM | POA: Diagnosis not present

## 2018-08-11 DIAGNOSIS — M48061 Spinal stenosis, lumbar region without neurogenic claudication: Secondary | ICD-10-CM | POA: Diagnosis not present

## 2018-08-11 DIAGNOSIS — R2689 Other abnormalities of gait and mobility: Secondary | ICD-10-CM | POA: Diagnosis not present

## 2018-08-11 NOTE — Telephone Encounter (Signed)
Patient advised as below. Patient verbalizes understanding and is in agreement with treatment plan.  

## 2018-08-22 ENCOUNTER — Telehealth: Payer: Self-pay | Admitting: Family Medicine

## 2018-08-22 DIAGNOSIS — G8929 Other chronic pain: Secondary | ICD-10-CM | POA: Diagnosis not present

## 2018-08-22 DIAGNOSIS — M170 Bilateral primary osteoarthritis of knee: Secondary | ICD-10-CM | POA: Diagnosis not present

## 2018-08-22 DIAGNOSIS — I1310 Hypertensive heart and chronic kidney disease without heart failure, with stage 1 through stage 4 chronic kidney disease, or unspecified chronic kidney disease: Secondary | ICD-10-CM | POA: Diagnosis not present

## 2018-08-22 DIAGNOSIS — M48061 Spinal stenosis, lumbar region without neurogenic claudication: Secondary | ICD-10-CM | POA: Diagnosis not present

## 2018-08-22 DIAGNOSIS — M4726 Other spondylosis with radiculopathy, lumbar region: Secondary | ICD-10-CM | POA: Diagnosis not present

## 2018-08-22 DIAGNOSIS — R2689 Other abnormalities of gait and mobility: Secondary | ICD-10-CM | POA: Diagnosis not present

## 2018-08-22 NOTE — Telephone Encounter (Signed)
[facility]loria [facility]cer from [facility] needs a verbal approval to make up two missed visits for Physical Therapy  CB#  [phone]  Thanks  Con Memos

## 2018-08-23 NOTE — Telephone Encounter (Signed)
OK for verbals  Virginia Crews, MD, MPH [facility] 08/23/2018 8:45 AM

## 2018-08-23 NOTE — Telephone Encounter (Signed)
Left Gloria Mercer with [facility] a message advising her that verbal orders are okay per Dr. Brita Romp.

## 2018-08-24 DIAGNOSIS — J301 Allergic rhinitis due to pollen: Secondary | ICD-10-CM | POA: Diagnosis not present

## 2018-08-28 DIAGNOSIS — R2689 Other abnormalities of gait and mobility: Secondary | ICD-10-CM | POA: Diagnosis not present

## 2018-08-28 DIAGNOSIS — M4726 Other spondylosis with radiculopathy, lumbar region: Secondary | ICD-10-CM | POA: Diagnosis not present

## 2018-08-28 DIAGNOSIS — G8929 Other chronic pain: Secondary | ICD-10-CM | POA: Diagnosis not present

## 2018-08-28 DIAGNOSIS — M170 Bilateral primary osteoarthritis of knee: Secondary | ICD-10-CM | POA: Diagnosis not present

## 2018-08-28 DIAGNOSIS — I1310 Hypertensive heart and chronic kidney disease without heart failure, with stage 1 through stage 4 chronic kidney disease, or unspecified chronic kidney disease: Secondary | ICD-10-CM | POA: Diagnosis not present

## 2018-08-28 DIAGNOSIS — M48061 Spinal stenosis, lumbar region without neurogenic claudication: Secondary | ICD-10-CM | POA: Diagnosis not present

## 2018-08-30 DIAGNOSIS — J301 Allergic rhinitis due to pollen: Secondary | ICD-10-CM | POA: Diagnosis not present

## 2018-09-04 ENCOUNTER — Other Ambulatory Visit: Payer: Self-pay | Admitting: Family Medicine

## 2018-09-05 DIAGNOSIS — I1310 Hypertensive heart and chronic kidney disease without heart failure, with stage 1 through stage 4 chronic kidney disease, or unspecified chronic kidney disease: Secondary | ICD-10-CM | POA: Diagnosis not present

## 2018-09-05 DIAGNOSIS — M4726 Other spondylosis with radiculopathy, lumbar region: Secondary | ICD-10-CM | POA: Diagnosis not present

## 2018-09-05 DIAGNOSIS — M170 Bilateral primary osteoarthritis of knee: Secondary | ICD-10-CM | POA: Diagnosis not present

## 2018-09-05 DIAGNOSIS — M48061 Spinal stenosis, lumbar region without neurogenic claudication: Secondary | ICD-10-CM | POA: Diagnosis not present

## 2018-09-05 DIAGNOSIS — G8929 Other chronic pain: Secondary | ICD-10-CM | POA: Diagnosis not present

## 2018-09-05 DIAGNOSIS — R2689 Other abnormalities of gait and mobility: Secondary | ICD-10-CM | POA: Diagnosis not present

## 2018-09-06 DIAGNOSIS — J301 Allergic rhinitis due to pollen: Secondary | ICD-10-CM | POA: Diagnosis not present

## 2018-09-14 DIAGNOSIS — J301 Allergic rhinitis due to pollen: Secondary | ICD-10-CM | POA: Diagnosis not present

## 2018-09-20 DIAGNOSIS — J301 Allergic rhinitis due to pollen: Secondary | ICD-10-CM | POA: Diagnosis not present

## 2018-09-22 DIAGNOSIS — H353122 Nonexudative age-related macular degeneration, left eye, intermediate dry stage: Secondary | ICD-10-CM | POA: Diagnosis not present

## 2018-09-22 DIAGNOSIS — H353212 Exudative age-related macular degeneration, right eye, with inactive choroidal neovascularization: Secondary | ICD-10-CM | POA: Diagnosis not present

## 2018-09-27 ENCOUNTER — Encounter: Payer: Self-pay | Admitting: Family Medicine

## 2018-09-27 ENCOUNTER — Ambulatory Visit (INDEPENDENT_AMBULATORY_CARE_PROVIDER_SITE_OTHER): Payer: Medicare Other | Admitting: Family Medicine

## 2018-09-27 VITALS — BP 122/78 | HR 77 | Temp 98.7°F | Wt 224.2 lb

## 2018-09-27 DIAGNOSIS — M545 Low back pain, unspecified: Secondary | ICD-10-CM

## 2018-09-27 DIAGNOSIS — M170 Bilateral primary osteoarthritis of knee: Secondary | ICD-10-CM | POA: Diagnosis not present

## 2018-09-27 DIAGNOSIS — G8929 Other chronic pain: Secondary | ICD-10-CM

## 2018-09-27 DIAGNOSIS — Z23 Encounter for immunization: Secondary | ICD-10-CM | POA: Diagnosis not present

## 2018-09-27 NOTE — Assessment & Plan Note (Signed)
Much improved Off of Flexeril and gabapentin and narcotics Continue Celebrex sparingly No radicular symptoms Temporary handicap placard form completed today Continue walker use

## 2018-09-27 NOTE — Assessment & Plan Note (Signed)
Managed by orthopedics Can continue Celebrex We have discussed to the risks of long-term NSAID use previously Patient is been unable to tolerate tramadol due to constipation She has CKD, but her creatinine has been stable while on this dose She is tried corticosteroid injections and Synvisc previously without improvement Handicap temporary placard form completed today

## 2018-09-27 NOTE — Progress Notes (Signed)
Patient: Gloria Mercer Female    DOB: June 23, 1941   77 y.o.   MRN: [id number] Visit Date: 09/27/2018  Today's Provider: Lavon Paganini, MD   Chief Complaint  Patient presents with  . Form Completion  . Knee Pain   Subjective:    Handicap Form Paperwork  Patient presents today for paperwork to be filled out. Patient states she needs the paperwork to obtain a handicap parking tag.  She uses a walker for walking and has difficulty walking long distances due to chronic knee and back pain.  Her sciatic back pain has been improving.  She is no longer taking gabapentin, Flexeril, prednisone.  She continues to take Celebrex daily.  She would like to eventually get off of this as it is a chronic NSAID.  She has been followed by orthopedics for her knees and has been told she will need to lose weight before she will qualify for knee replacement.  She has an upcoming appointment at the spine center and mostly wants to get information about preventing this from recurring from them as she is doing better now.  She also has plans to see a nutrition and weight loss center in [place] as part of [facility]     Allergies  Allergen Reactions  . Erythromycin Other (See Comments)    Other reaction(s): Headache Headache and GI upset Headache and GI upset  . Losartan Nausea And Vomiting  . Metoprolol Nausea And Vomiting  . Simvastatin Other (See Comments)    Other reaction(s): Muscle Pain Pain lower extremities Pain lower extremities  . Dexlansoprazole Diarrhea  . Lisinopril Cough  . Prednisone     Other reaction(s): Constipation GI upset     Current Outpatient Medications:  .  amLODipine (NORVASC) 5 MG tablet, TAKE 1 TABLET(5 MG) BY MOUTH DAILY, Disp: 30 tablet, Rfl: 5 .  azelastine (ASTELIN) 0.1 % nasal spray, Place 2 sprays into both nostrils daily., Disp: , Rfl:  .  celecoxib (CELEBREX) 200 MG capsule, TAKE 1 CAPSULE(200 MG) BY MOUTH DAILY, Disp: 30 capsule, Rfl: 2 .   Cholecalciferol (VITAMIN D3) 5000 units CAPS, Take 1 capsule by mouth daily., Disp: , Rfl:  .  clotrimazole-betamethasone (LOTRISONE) cream, Apply 1 application topically 2 (two) times daily as needed., Disp: 60 g, Rfl: 1 .  esomeprazole (NEXIUM) 20 MG capsule, Take 20 mg by mouth daily at 12 noon., Disp: , Rfl:  .  ipratropium (ATROVENT) 0.03 % nasal spray, Place 2 sprays into both nostrils every 8 (eight) hours as needed., Disp: , Rfl: 12 .  loratadine (CLARITIN) 10 MG tablet, Take 2 tablets by mouth daily., Disp: , Rfl:  .  montelukast (SINGULAIR) 10 MG tablet, Take 1 tablet by mouth daily., Disp: , Rfl:  .  Multiple Vitamins-Minerals (EYE VITAMINS PO), Take by mouth., Disp: , Rfl:  .  Omega-3 Fatty Acids (FISH OIL) 1000 MG CAPS, Take 1 capsule by mouth daily., Disp: , Rfl:  .  pramipexole (MIRAPEX) 0.125 MG tablet, TAKE 4 TABLETS(0.5 MG) BY MOUTH AT BEDTIME, Disp: 120 tablet, Rfl: 3 .  senna-docusate (SENOKOT-S) 8.6-50 MG tablet, Take 1-2 tablets by mouth 2 (two) times daily., Disp: 60 tablet, Rfl: 1 .  triamcinolone (NASACORT ALLERGY 24HR) 55 MCG/ACT AERO nasal inhaler, Place 2 sprays into the nose daily., Disp: , Rfl:  .  triamterene-hydrochlorothiazide (MAXZIDE-25) 37.5-25 MG tablet, Take 1 tablet by mouth daily., Disp: 90 tablet, Rfl: 2  Review of Systems  Constitutional: Negative.   HENT:  Negative.   Respiratory: Positive for wheezing.   Cardiovascular: Negative.   Genitourinary: Negative.   Musculoskeletal: Negative.   Allergic/Immunologic: Positive for environmental allergies.  Psychiatric/Behavioral: Negative.     Social History   Tobacco Use  . Smoking status: Former Smoker    Packs/day: 1.00    Years: 10.00    Pack years: 10.00    Types: Cigarettes    Last attempt to quit: 11/28/1976    Years since quitting: 41.8  . Smokeless tobacco: Never Used  Substance Use Topics  . Alcohol use: Yes    Comment: rarely, at holidays   Objective:   BP 122/78 (BP Location: Right  Wrist, Patient Position: Sitting, Cuff Size: Normal)   Pulse 77   Temp 98.7 F (37.1 C) (Oral)   Wt 224 lb 3.2 oz (101.7 kg)   SpO2 96%   BMI 39.72 kg/m  Vitals:   09/27/18 0951  BP: 122/78  Pulse: 77  Temp: 98.7 F (37.1 C)  TempSrc: Oral  SpO2: 96%  Weight: 224 lb 3.2 oz (101.7 kg)     Physical Exam  Constitutional: She is oriented to person, place, and time. She appears well-developed and well-nourished. No distress.  HENT:  Head: Normocephalic and atraumatic.  Mouth/Throat: Oropharynx is clear and moist.  Eyes: Pupils are equal, round, and reactive to light. Conjunctivae are normal. No scleral icterus.  Neck: Neck supple. No thyromegaly present.  Cardiovascular: Normal rate, regular rhythm, normal heart sounds and intact distal pulses.  No murmur heard. Pulmonary/Chest: Effort normal and breath sounds normal. No respiratory distress. She has no wheezes. She has no rales.  Abdominal: Soft. She exhibits no distension. There is no tenderness.  Musculoskeletal: She exhibits no edema.  Lymphadenopathy:    She has no cervical adenopathy.  Neurological: She is alert and oriented to person, place, and time.  Skin: Skin is warm and dry. Capillary refill takes less than 2 seconds. No rash noted.  Psychiatric: She has a normal mood and affect. Her behavior is normal.  Vitals reviewed.       Assessment & Plan:   Problem List Items Addressed This Visit      Musculoskeletal and Integument   Osteoarthritis of both knees - Primary    Managed by orthopedics Can continue Celebrex We have discussed to the risks of long-term NSAID use previously Patient is been unable to tolerate tramadol due to constipation She has CKD, but her creatinine has been stable while on this dose She is tried corticosteroid injections and Synvisc previously without improvement Handicap temporary placard form completed today        Other   Chronic back pain    Much improved Off of Flexeril and  gabapentin and narcotics Continue Celebrex sparingly No radicular symptoms Temporary handicap placard form completed today Continue walker use       Other Visit Diagnoses    Need for influenza vaccination       Relevant Orders   Flu vaccine HIGH DOSE PF (Completed)       Return if symptoms worsen or fail to improve.   The entirety of the information documented in the History of Present Illness, Review of Systems and Physical Exam were personally obtained by me. Portions of this information were initially documented by Tiburcio Pea and Hurman Horn, CMA and reviewed by me for thoroughness and accuracy.    Virginia Crews, MD, MPH [facility] 09/27/2018 11:13 AM

## 2018-10-05 DIAGNOSIS — J301 Allergic rhinitis due to pollen: Secondary | ICD-10-CM | POA: Diagnosis not present

## 2018-10-10 ENCOUNTER — Telehealth: Payer: Self-pay | Admitting: Family Medicine

## 2018-10-10 NOTE — Telephone Encounter (Signed)
I left a message asking the pt to call me at [phone] to schedule AWV-S w/ [facility]. Last AWV 09/22/15. VDM (DD)

## 2018-10-10 NOTE — Telephone Encounter (Signed)
° °  Pt needing to know what Dr. B could suggest for her left hip pain now. Pt taking 1 celecoxib (CELEBREX) 200 MG capsule  a day and lancane patches prescribed.  Thanks, [employer]

## 2018-10-11 DIAGNOSIS — J301 Allergic rhinitis due to pollen: Secondary | ICD-10-CM | POA: Diagnosis not present

## 2018-10-11 NOTE — Telephone Encounter (Signed)
Patient advised. She agrees to restart Gabapentin. She states she has a follow up with Ortho scheduled for 10/17/2018.

## 2018-10-11 NOTE — Telephone Encounter (Signed)
Could go back on gabapentin if she wants.  Is she still seeing Ortho?  Virginia Crews, MD, MPH [facility] 10/11/2018 10:38 AM

## 2018-10-19 ENCOUNTER — Telehealth: Payer: Self-pay | Admitting: Family Medicine

## 2018-10-19 DIAGNOSIS — J301 Allergic rhinitis due to pollen: Secondary | ICD-10-CM | POA: Diagnosis not present

## 2018-10-19 NOTE — Telephone Encounter (Signed)
LMTCB

## 2018-10-19 NOTE — Telephone Encounter (Signed)
The celebrex might have been raising BP, but being without it will not affect BP.  Her kidneys will be happy to be without the celebrex though.  If BP goes up again, we should f/u and see if she needs some more medicine for BP.  Virginia Crews, MD, MPH [facility] 10/19/2018 10:00 AM

## 2018-10-19 NOTE — Telephone Encounter (Signed)
Pt advised.   Thanks,   -Laura  

## 2018-10-19 NOTE — Telephone Encounter (Signed)
Pt tried to see how she would do without taking celecoxib (CELEBREX) 200 MG capsule.  Pt has gone 4 days without it.  Pt hasn't started back taking it.  The reason she is calling is her BP was erratic and high.  Her BP finally leveling out to 131/71 with resting.  Just wanting to discuss this with a nurse to see if she can continue to be without medication.  Please advise.  Thanks, [employer]

## 2018-10-22 IMAGING — CR DG ABDOMEN 1V
3 series · 3 of 3 positions shown · non-contrast
Comparison: None.

CLINICAL DATA: Patient reports abdominal discomfort and
constipation over past week. Reports last BM was 4-5 days ago. Hx
tubal ligation, cholecystectomy.

EXAM:
ABDOMEN - 1 VIEW

[abdomen kub (1 of 3)]
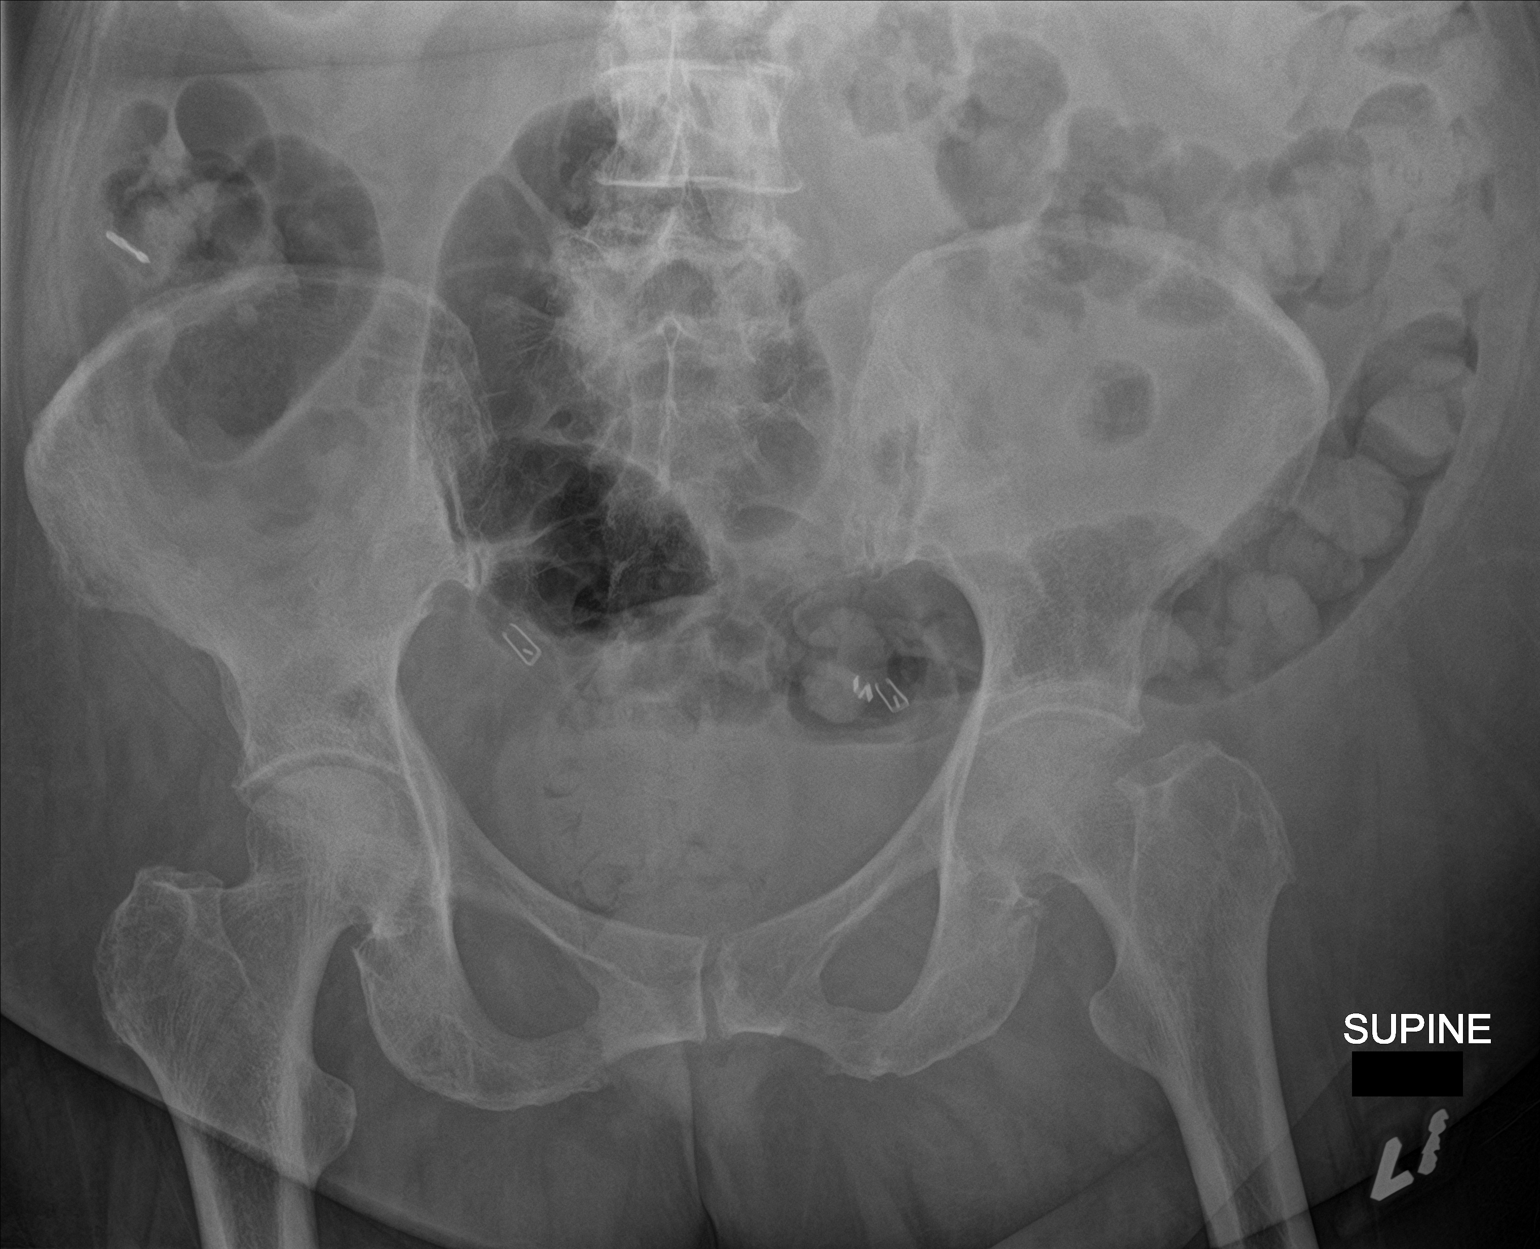

[abdomen kub (2 of 3)]
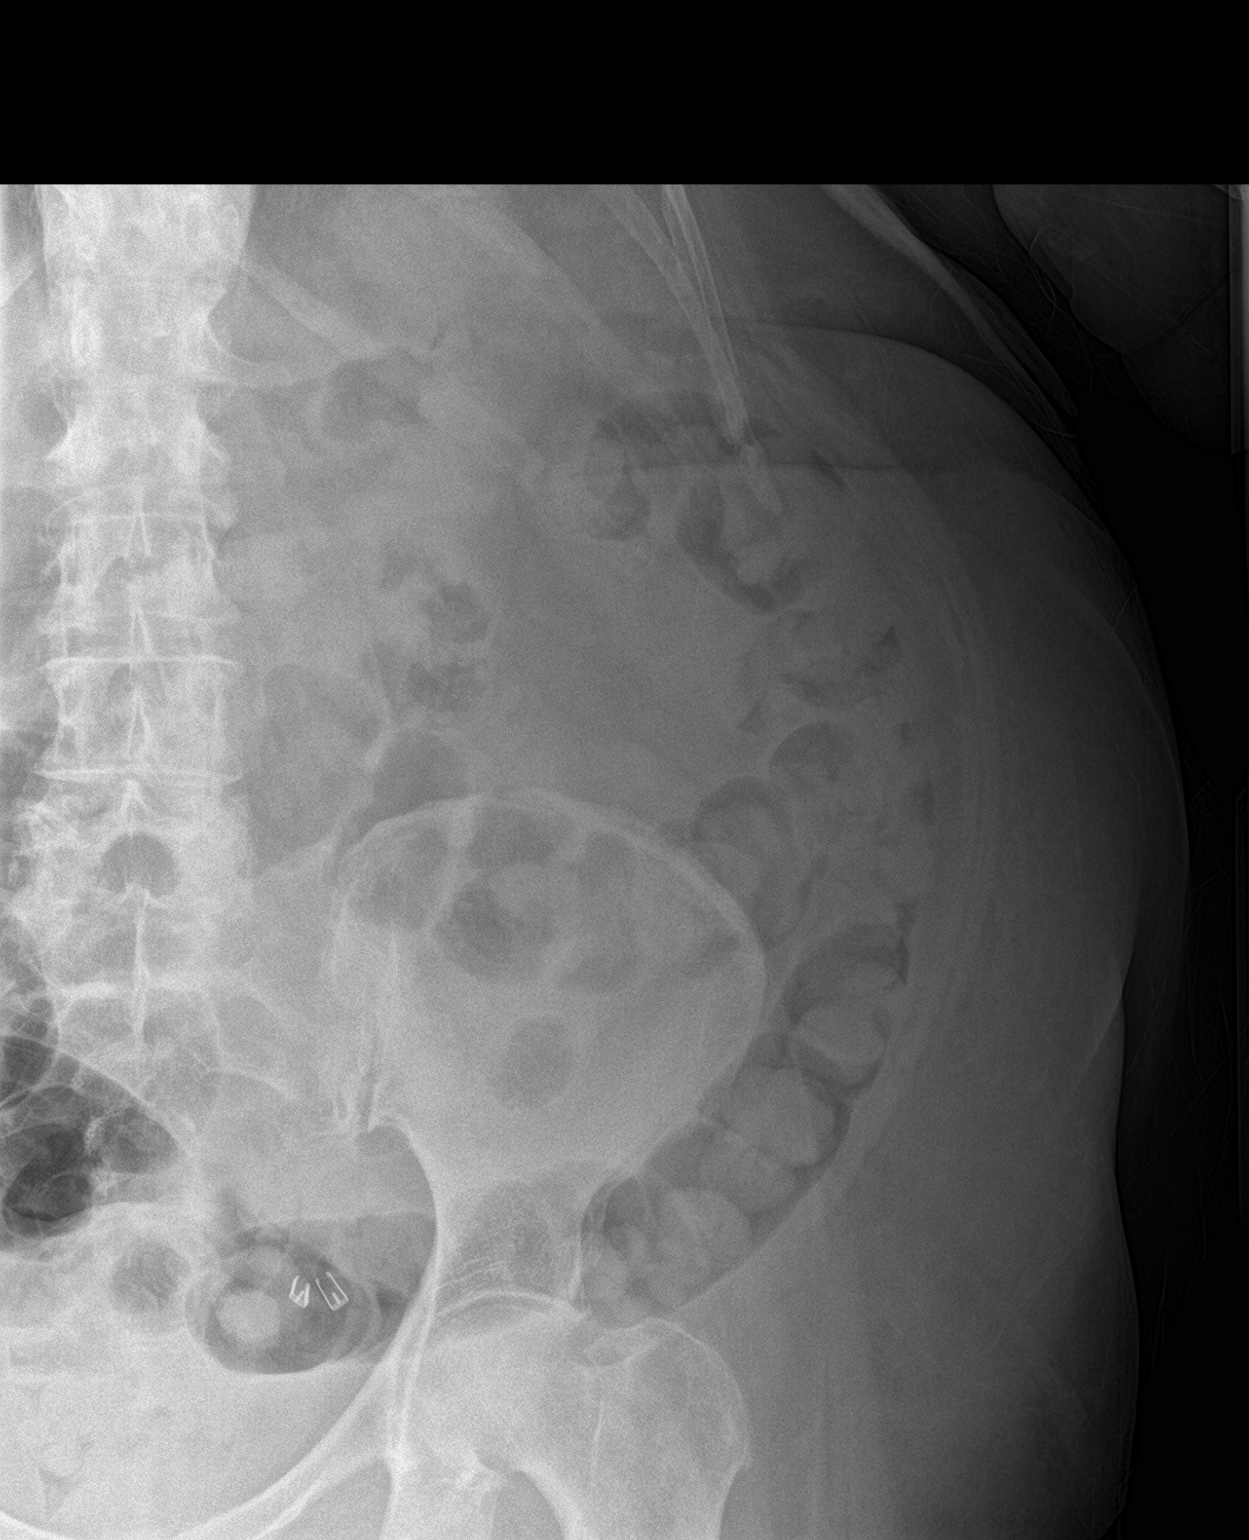

[abdomen kub (3 of 3)]
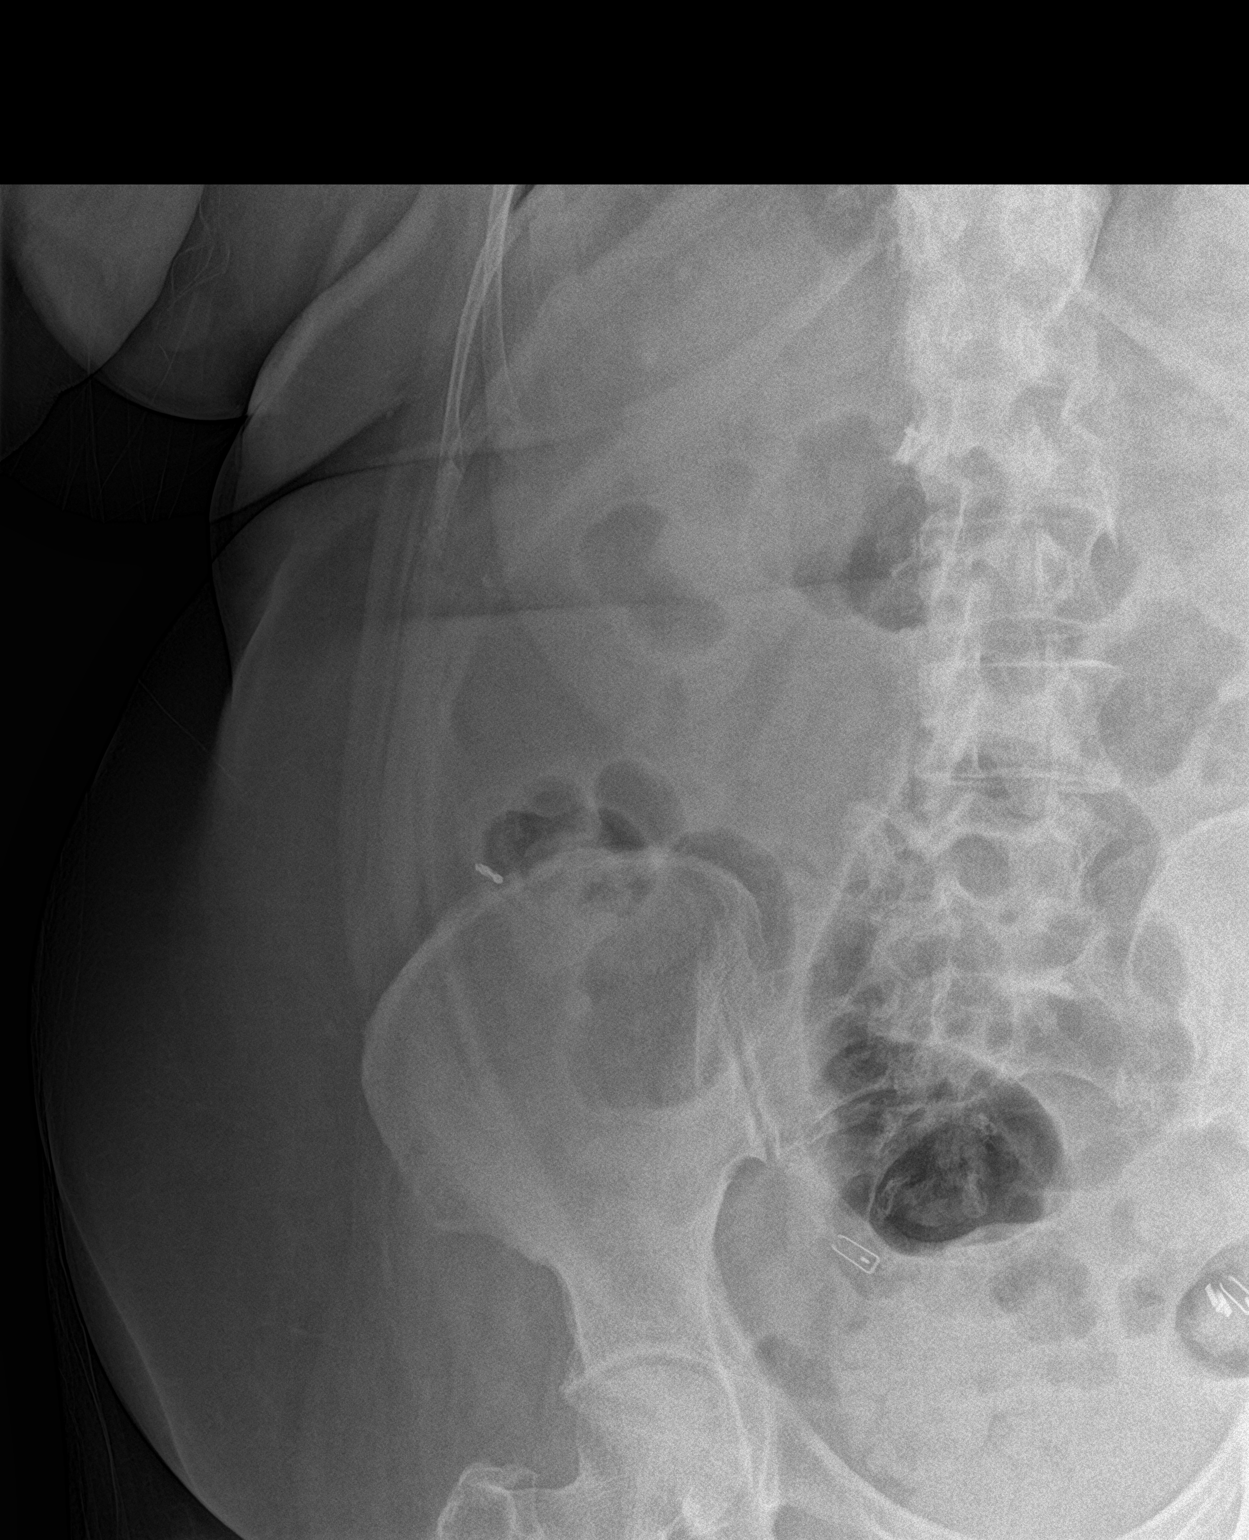

[3 of 3 positions shown; findings below may reference images not displayed]

FINDINGS: Normal bowel gas pattern.  No significant increase in colonic stool.

No evidence of renal or ureteral stones. Tubal ligation clips are
noted in the pelvis. Soft tissues otherwise unremarkable. No acute
skeletal abnormality.
IMPRESSION: 1. No acute findings.  No evidence of bowel obstruction.
2. No significant increase in colonic stool.

## 2018-10-23 DIAGNOSIS — J301 Allergic rhinitis due to pollen: Secondary | ICD-10-CM | POA: Diagnosis not present

## 2018-11-01 DIAGNOSIS — J301 Allergic rhinitis due to pollen: Secondary | ICD-10-CM | POA: Diagnosis not present

## 2018-11-08 DIAGNOSIS — J301 Allergic rhinitis due to pollen: Secondary | ICD-10-CM | POA: Diagnosis not present

## 2018-12-08 ENCOUNTER — Other Ambulatory Visit: Payer: Self-pay

## 2018-12-08 MED ORDER — AZELASTINE HCL 0.1 % NA SOLN: 2.0000 | Freq: Every day | NASAL | 11 refills | Status: DC

## 2018-12-08 NOTE — Telephone Encounter (Signed)
Patient called needing refills on medication. Please review. Thanks!

## 2018-12-14 DIAGNOSIS — M4800 Spinal stenosis, site unspecified: Secondary | ICD-10-CM | POA: Diagnosis not present

## 2018-12-25 ENCOUNTER — Ambulatory Visit (INDEPENDENT_AMBULATORY_CARE_PROVIDER_SITE_OTHER): Payer: Medicare Other | Admitting: Family Medicine

## 2018-12-25 ENCOUNTER — Encounter: Payer: Self-pay | Admitting: Family Medicine

## 2018-12-25 VITALS — BP 139/65 | HR 69 | Temp 97.9°F | Wt 235.2 lb

## 2018-12-25 DIAGNOSIS — R7303 Prediabetes: Secondary | ICD-10-CM | POA: Diagnosis not present

## 2018-12-25 DIAGNOSIS — E559 Vitamin D deficiency, unspecified: Secondary | ICD-10-CM | POA: Diagnosis not present

## 2018-12-25 DIAGNOSIS — I10 Essential (primary) hypertension: Secondary | ICD-10-CM

## 2018-12-25 DIAGNOSIS — J3089 Other allergic rhinitis: Secondary | ICD-10-CM

## 2018-12-25 DIAGNOSIS — G2581 Restless legs syndrome: Secondary | ICD-10-CM

## 2018-12-25 DIAGNOSIS — K219 Gastro-esophageal reflux disease without esophagitis: Secondary | ICD-10-CM

## 2018-12-25 DIAGNOSIS — R6889 Other general symptoms and signs: Secondary | ICD-10-CM | POA: Diagnosis not present

## 2018-12-25 DIAGNOSIS — D72829 Elevated white blood cell count, unspecified: Secondary | ICD-10-CM | POA: Diagnosis not present

## 2018-12-25 DIAGNOSIS — D510 Vitamin B12 deficiency anemia due to intrinsic factor deficiency: Secondary | ICD-10-CM | POA: Diagnosis not present

## 2018-12-25 MED ORDER — HYDRALAZINE HCL 25 MG PO TABS: 25.0000 mg | ORAL_TABLET | Freq: Two times a day (BID) | ORAL | 5 refills | Status: DC

## 2018-12-25 MED ORDER — PRAMIPEXOLE DIHYDROCHLORIDE 0.5 MG PO TABS: 0.5000 mg | ORAL_TABLET | Freq: Every day | ORAL | 3 refills | Status: DC

## 2018-12-25 NOTE — Assessment & Plan Note (Signed)
Stable and well-controlled Continue Mirapex 0.5 mg nightly

## 2018-12-25 NOTE — Assessment & Plan Note (Signed)
Recheck vitamin D

## 2018-12-25 NOTE — Assessment & Plan Note (Signed)
Recheck A1c 

## 2018-12-25 NOTE — Assessment & Plan Note (Signed)
Managed by ENT and on maximum medical therapy Has been having difficulty getting her allergy shots recently and is therefore having worsening of symptoms Continue Singulair, Claritin, Nasacort, Atrovent nasal spray, Astelin nasal spray

## 2018-12-25 NOTE — Patient Instructions (Signed)
Stop amlodipine to see if this helps with swelling Start Hydralazine twice daily  Continue other medications  We will call with lab results

## 2018-12-25 NOTE — Assessment & Plan Note (Signed)
Recheck CBC and B12 levels Not currently taking B12 supplement

## 2018-12-25 NOTE — Assessment & Plan Note (Signed)
Well-controlled Having lower extremity edema secondary to amlodipine Previously thought this may be related to Celebrex, but she is not taking this any longer We will DC amlodipine Continue Maxide She has had issues with lisinopril, losartan, and metoprolol in the past We will try hydralazine instead at 25 mg twice daily Discussed possibility of rebound hypertension if she misses doses Could need to increase dose to 3 times daily in the future

## 2018-12-25 NOTE — Assessment & Plan Note (Signed)
Stable and well-controlled Continue Nexium at current dose Patient knows about potential side effects of long-term PPI use Check B12 level

## 2018-12-25 NOTE — Progress Notes (Signed)
Patient: Gloria Mercer Female    DOB: [birth year]   78 y.o.   MRN: [id number] Visit Date: 12/25/2018  Today's Provider: Lavon Paganini, MD   Chief Complaint  Patient presents with  . Hypertension  . RLS   Subjective:    I, Tiburcio Pea, CMA, am acting as a Education administrator for Lavon Paganini, MD.   HPI  Hypertension, follow-up:  BP Readings from Last 3 Encounters:  12/25/18 139/65  09/27/18 122/78  07/20/18 (!) 142/84   She was last seen for hypertension 6 months ago.  BP at that visit was 120/84. Management since that visit includes continuing medications. She reports good compliance with treatment. She is not having side effects.  She is not exercising. Due to OA bilateral knees. She is adherent to low salt diet.   Outside blood pressures are being checked periodically. BP readings are WNL She is experiencing fatigue and lower extremity edema.  Patient denies chest pain, chest pressure/discomfort, claudication, dyspnea, exertional chest pressure/discomfort, irregular heart beat, near-syncope, orthopnea, palpitations and syncope.   Cardiovascular risk factors include advanced age (older than 7 for men, 48 for women), dyslipidemia, hypertension, obesity (BMI >= 30 kg/m2) and sedentary lifestyle.  Use of agents associated with hypertension: NSAIDS and topical mineralocorticoids.                Weight trend: increased Wt Readings from Last 3 Encounters:  12/25/18 235 lb 3.2 oz (106.7 kg)  09/27/18 224 lb 3.2 oz (101.7 kg)  07/20/18 226 lb (102.5 kg)     Current diet: in general, trying to restart a "healthy" diet     ------------------------------------------------------------------------   Follow up for RLS  The patient was last seen for this 6 months ago. Changes made at last visit include continue Mirapex 0.25 nightly (can take up to 0.5 mg qhs prn).  She reports excellent compliance with treatment. She states she is taking 2 tablets every  night. She feels that condition is Improved. She is not having side effects.   ------------------------------------------------------------------------------------ She is having worsening allergic rhinitis symptoms.  She is unable to get her allergy shot serum from [facility] for ~1 month.  She continues to take claritin and singulair  Leukocytosis in 06/2018 around time of hospitalization due to back pain.  Denies night sweats, fever, lymphadenopathy  She reports cold intolerance for ~1 yr.  This is a change for her as she was previously hot all the time.  She wonders if she needs her thyroid checked    Allergies  Allergen Reactions  . Erythromycin Other (See Comments)    Other reaction(s): Headache Headache and GI upset Headache and GI upset  . Losartan Nausea And Vomiting  . Metoprolol Nausea And Vomiting  . Simvastatin Other (See Comments)    Other reaction(s): Muscle Pain Pain lower extremities Pain lower extremities  . Dexlansoprazole Diarrhea  . Lisinopril Cough  . Prednisone     Other reaction(s): Constipation GI upset     Current Outpatient Medications:  .  amLODipine (NORVASC) 5 MG tablet, TAKE 1 TABLET(5 MG) BY MOUTH DAILY, Disp: 30 tablet, Rfl: 5 .  azelastine (ASTELIN) 0.1 % nasal spray, Place 2 sprays into both nostrils daily., Disp: 30 mL, Rfl: 11 .  Cholecalciferol (VITAMIN D3) 5000 units CAPS, Take 1 capsule by mouth daily., Disp: , Rfl:  .  clotrimazole-betamethasone (LOTRISONE) cream, Apply 1 application topically 2 (two) times daily as needed., Disp: 60 g, Rfl: 1 .  EPINEPHrine 0.3 mg/0.3  mL IJ SOAJ injection, U UTD, Disp: , Rfl:  .  esomeprazole (NEXIUM) 20 MG capsule, Take 20 mg by mouth daily at 12 noon., Disp: , Rfl:  .  ipratropium (ATROVENT) 0.03 % nasal spray, Place 2 sprays into both nostrils every 8 (eight) hours as needed., Disp: , Rfl: 12 .  loratadine (CLARITIN) 10 MG tablet, Take 2 tablets by mouth daily., Disp: , Rfl:  .  montelukast  (SINGULAIR) 10 MG tablet, Take 1 tablet by mouth daily., Disp: , Rfl:  .  Multiple Vitamins-Minerals (EYE VITAMINS PO), Take by mouth., Disp: , Rfl:  .  Naproxen Sodium 220 MG CAPS, Take by mouth., Disp: , Rfl:  .  Omega-3 Fatty Acids (FISH OIL) 1000 MG CAPS, Take 1 capsule by mouth daily., Disp: , Rfl:  .  pramipexole (MIRAPEX) 0.125 MG tablet, TAKE 4 TABLETS(0.5 MG) BY MOUTH AT BEDTIME, Disp: 120 tablet, Rfl: 3 .  senna-docusate (SENOKOT-S) 8.6-50 MG tablet, Take 1-2 tablets by mouth 2 (two) times daily., Disp: 60 tablet, Rfl: 1 .  triamcinolone (NASACORT ALLERGY 24HR) 55 MCG/ACT AERO nasal inhaler, Place 2 sprays into the nose daily., Disp: , Rfl:  .  triamterene-hydrochlorothiazide (MAXZIDE-25) 37.5-25 MG tablet, Take 1 tablet by mouth daily., Disp: 90 tablet, Rfl: 2 .  celecoxib (CELEBREX) 200 MG capsule, TAKE 1 CAPSULE(200 MG) BY MOUTH DAILY (Patient not taking: Reported on 12/25/2018), Disp: 30 capsule, Rfl: 2  Review of Systems  Constitutional: Negative.   Respiratory: Negative.   Cardiovascular: Negative.   Musculoskeletal: Negative.     Social History   Tobacco Use  . Smoking status: Former Smoker    Packs/day: 1.00    Years: 10.00    Pack years: 10.00    Types: Cigarettes    Last attempt to quit: 11/28/1976    Years since quitting: 42.1  . Smokeless tobacco: Never Used  Substance Use Topics  . Alcohol use: Yes    Comment: rarely, at holidays      Objective:   BP 139/65 (BP Location: Right Arm, Patient Position: Sitting, Cuff Size: Large)   Pulse 69   Temp 97.9 F (36.6 C) (Oral)   Wt 235 lb 3.2 oz (106.7 kg)   SpO2 99%   BMI 41.66 kg/m  Vitals:   12/25/18 1323  BP: 139/65  Pulse: 69  Temp: 97.9 F (36.6 C)  TempSrc: Oral  SpO2: 99%  Weight: 235 lb 3.2 oz (106.7 kg)     Physical Exam Vitals signs reviewed.  Constitutional:      General: She is not in acute distress.    Appearance: Normal appearance. She is obese. She is not diaphoretic.  HENT:      Head: Normocephalic and atraumatic.     Mouth/Throat:     Pharynx: Oropharynx is clear.  Eyes:     General: No scleral icterus.    Conjunctiva/sclera: Conjunctivae normal.  Neck:     Musculoskeletal: Neck supple.     Thyroid: No thyromegaly or thyroid tenderness.  Cardiovascular:     Rate and Rhythm: Normal rate and regular rhythm.     Pulses: Normal pulses.     Heart sounds: Normal heart sounds. No murmur.  Pulmonary:     Effort: Pulmonary effort is normal. No respiratory distress.     Breath sounds: Normal breath sounds. No wheezing or rhonchi.  Musculoskeletal:     Right lower leg: Edema (2+) present.     Left lower leg: Edema (2+) present.  Lymphadenopathy:  Cervical: No cervical adenopathy.  Skin:    General: Skin is warm and dry.     Capillary Refill: Capillary refill takes less than 2 seconds.     Findings: No rash.  Neurological:     Mental Status: She is alert and oriented to person, place, and time. Mental status is at baseline.  Psychiatric:        Mood and Affect: Mood normal.        Behavior: Behavior normal.       Assessment & Plan   Problem List Items Addressed This Visit      Cardiovascular and Mediastinum   Essential hypertension - Primary    Well-controlled Having lower extremity edema secondary to amlodipine Previously thought this may be related to Celebrex, but she is not taking this any longer We will DC amlodipine Continue Maxide She has had issues with lisinopril, losartan, and metoprolol in the past We will try hydralazine instead at 25 mg twice daily Discussed possibility of rebound hypertension if she misses doses Could need to increase dose to 3 times daily in the future      Relevant Medications   EPINEPHrine 0.3 mg/0.3 mL IJ SOAJ injection   hydrALAZINE (APRESOLINE) 25 MG tablet   Other Relevant Orders   Basic Metabolic Panel (BMET)     Respiratory   Allergic rhinitis    Managed by ENT and on maximum medical therapy Has been  having difficulty getting her allergy shots recently and is therefore having worsening of symptoms Continue Singulair, Claritin, Nasacort, Atrovent nasal spray, Astelin nasal spray        Digestive   Gastroesophageal reflux disease without esophagitis    Stable and well-controlled Continue Nexium at current dose Patient knows about potential side effects of long-term PPI use Check B12 level      Relevant Orders   CBC w/Diff/Platelet     Other   RLS (restless legs syndrome)    Stable and well-controlled Continue Mirapex 0.5 mg nightly      PA (pernicious anemia)    Recheck CBC and B12 levels Not currently taking B12 supplement      Relevant Orders   B12   Prediabetes    Recheck A1c      Relevant Orders   Hemoglobin A1c   Vitamin D deficiency    Recheck vitamin D      Relevant Orders   VITAMIN D 25 Hydroxy (Vit-D Deficiency, Fractures)    Other Visit Diagnoses    Cold intolerance       Relevant Orders   TSH   Leukocytosis, unspecified type       Relevant Orders   CBC w/Diff/Platelet       Return in about 2 months (around 02/23/2019) for BP f/u.   The entirety of the information documented in the History of Present Illness, Review of Systems and Physical Exam were personally obtained by me. Portions of this information were initially documented by Tiburcio Pea, CMA and reviewed by me for thoroughness and accuracy.    Virginia Crews, MD, MPH [facility] 12/25/2018 5:01 PM

## 2018-12-26 LAB — BASIC METABOLIC PANEL
BUN/Creatinine Ratio: 22 (ref 12–28)
BUN: 22 mg/dL (ref 8–27)
CO2: 22 mmol/L (ref 20–29)
CREATININE: 1 mg/dL (ref 0.57–1.00)
Calcium: 9.7 mg/dL (ref 8.7–10.3)
Chloride: 100 mmol/L (ref 96–106)
GFR calc Af Amer: 62 mL/min/{1.73_m2} (ref >59)
GFR calc non Af Amer: 54 mL/min/{1.73_m2} — ABNORMAL LOW (ref >59)
GLUCOSE: 100 mg/dL — AB (ref 65–99)
Potassium: 4.2 mmol/L (ref 3.5–5.2)
SODIUM: 139 mmol/L (ref 134–144)

## 2018-12-26 LAB — CBC WITH DIFFERENTIAL/PLATELET
Basophils Absolute: 0.1 10*3/uL (ref 0.0–0.2)
Basos: 1 %
EOS (ABSOLUTE): 0.3 10*3/uL (ref 0.0–0.4)
EOS: 4 %
HEMATOCRIT: 37.7 % (ref 34.0–46.6)
Hemoglobin: 12.7 g/dL (ref 11.1–15.9)
IMMATURE GRANULOCYTES: 0 %
Immature Grans (Abs): 0 10*3/uL (ref 0.0–0.1)
Lymphocytes Absolute: 2.4 10*3/uL (ref 0.7–3.1)
Lymphs: 28 %
MCH: 29.1 pg (ref 26.6–33.0)
MCHC: 33.7 g/dL (ref 31.5–35.7)
MCV: 86 fL (ref 79–97)
MONOS ABS: 0.6 10*3/uL (ref 0.1–0.9)
Monocytes: 6 %
NEUTROS PCT: 61 %
Neutrophils Absolute: 5.3 10*3/uL (ref 1.4–7.0)
PLATELETS: 270 10*3/uL (ref 150–450)
RBC: 4.37 x10E6/uL (ref 3.77–5.28)
RDW: 12.3 % (ref 11.7–15.4)
WBC: 8.7 10*3/uL (ref 3.4–10.8)

## 2018-12-26 LAB — VITAMIN D 25 HYDROXY (VIT D DEFICIENCY, FRACTURES): VIT D 25 HYDROXY: 45.4 ng/mL (ref 30.0–100.0)

## 2018-12-26 LAB — TSH: TSH: 2.96 u[IU]/mL (ref 0.450–4.500)

## 2018-12-26 LAB — HEMOGLOBIN A1C
Est. average glucose Bld gHb Est-mCnc: 111 mg/dL
Hgb A1c MFr Bld: 5.5 % (ref 4.8–5.6)

## 2018-12-26 LAB — VITAMIN B12: VITAMIN B 12: 300 pg/mL (ref 232–1245)

## 2019-01-01 ENCOUNTER — Other Ambulatory Visit: Payer: Self-pay | Admitting: Family Medicine

## 2019-01-06 ENCOUNTER — Other Ambulatory Visit: Payer: Self-pay | Admitting: Family Medicine

## 2019-02-22 ENCOUNTER — Telehealth: Payer: Self-pay | Admitting: Family Medicine

## 2019-02-22 NOTE — Telephone Encounter (Signed)
Pt contacted after hours nurse line and is requesting Dr. B complete Disability Parking Application so she can renew her parking placard. Please advise. Thanks TNP

## 2019-02-23 NOTE — Telephone Encounter (Signed)
Form is completed. Patient advised

## 2019-03-01 ENCOUNTER — Ambulatory Visit: Payer: Medicare Other | Admitting: Family Medicine

## 2019-04-11 ENCOUNTER — Other Ambulatory Visit: Payer: Self-pay | Admitting: Family Medicine

## 2019-05-11 NOTE — Progress Notes (Deleted)
       Patient: Gloria Mercer Female    DOB: 04/02/1941   78 y.o.   MRN: [id number] Visit Date: 05/11/2019  Today's Provider: Mar Daring, PA-C   No chief complaint on file.  Subjective:     HPI  Allergies  Allergen Reactions  . Erythromycin Other (See Comments)    Other reaction(s): Headache Headache and GI upset Headache and GI upset  . Losartan Nausea And Vomiting  . Metoprolol Nausea And Vomiting  . Simvastatin Other (See Comments)    Other reaction(s): Muscle Pain Pain lower extremities Pain lower extremities  . Dexlansoprazole Diarrhea  . Lisinopril Cough  . Prednisone     Other reaction(s): Constipation GI upset     Current Outpatient Medications:  .  azelastine (ASTELIN) 0.1 % nasal spray, Place 2 sprays into both nostrils daily., Disp: 30 mL, Rfl: 11 .  celecoxib (CELEBREX) 200 MG capsule, TAKE 1 CAPSULE(200 MG) BY MOUTH DAILY (Patient not taking: Reported on 12/25/2018), Disp: 30 capsule, Rfl: 2 .  Cholecalciferol (VITAMIN D3) 5000 units CAPS, Take 1 capsule by mouth daily., Disp: , Rfl:  .  clotrimazole-betamethasone (LOTRISONE) cream, Apply 1 application topically 2 (two) times daily as needed., Disp: 60 g, Rfl: 1 .  EPINEPHrine 0.3 mg/0.3 mL IJ SOAJ injection, U UTD, Disp: , Rfl:  .  esomeprazole (NEXIUM) 20 MG capsule, Take 20 mg by mouth daily at 12 noon., Disp: , Rfl:  .  hydrALAZINE (APRESOLINE) 25 MG tablet, Take 1 tablet (25 mg total) by mouth 2 (two) times daily., Disp: 60 tablet, Rfl: 5 .  ipratropium (ATROVENT) 0.03 % nasal spray, Place 2 sprays into both nostrils every 8 (eight) hours as needed., Disp: , Rfl: 12 .  loratadine (CLARITIN) 10 MG tablet, Take 2 tablets by mouth daily., Disp: , Rfl:  .  montelukast (SINGULAIR) 10 MG tablet, Take 1 tablet by mouth daily., Disp: , Rfl:  .  Multiple Vitamins-Minerals (EYE VITAMINS PO), Take by mouth., Disp: , Rfl:  .  Naproxen Sodium 220 MG CAPS, Take by mouth., Disp: , Rfl:  .  Omega-3 Fatty  Acids (FISH OIL) 1000 MG CAPS, Take 1 capsule by mouth daily., Disp: , Rfl:  .  pramipexole (MIRAPEX) 0.5 MG tablet, Take 1 tablet (0.5 mg total) by mouth at bedtime., Disp: 90 tablet, Rfl: 3 .  senna-docusate (SENOKOT-S) 8.6-50 MG tablet, Take 1-2 tablets by mouth 2 (two) times daily., Disp: 60 tablet, Rfl: 1 .  triamcinolone (NASACORT ALLERGY 24HR) 55 MCG/ACT AERO nasal inhaler, Place 2 sprays into the nose daily., Disp: , Rfl:  .  triamterene-hydrochlorothiazide (MAXZIDE-25) 37.5-25 MG tablet, TAKE 1 TABLET BY MOUTH DAILY, Disp: 90 tablet, Rfl: 0  Review of Systems  Social History   Tobacco Use  . Smoking status: Former Smoker    Packs/day: 1.00    Years: 10.00    Pack years: 10.00    Types: Cigarettes    Quit date: 11/28/1976    Years since quitting: 42.4  . Smokeless tobacco: Never Used  Substance Use Topics  . Alcohol use: Yes    Comment: rarely, at holidays      Objective:   There were no vitals taken for this visit. There were no vitals filed for this visit.   Physical Exam      Assessment & [name], PA-C  [facility]

## 2019-05-14 ENCOUNTER — Ambulatory Visit: Payer: Self-pay | Admitting: Physician Assistant

## 2019-05-25 NOTE — Progress Notes (Signed)
Patient: Gloria Mercer Female    DOB: 07-24-1941   78 y.o.   MRN: [id number] Visit Date: 05/28/2019  Today's Provider: Mar Daring, PA-C   Chief Complaint  Patient presents with  . Edema   Subjective:     HPI   Patient is here concerning swelling in her feet. Patient states her left foot is worse than the right. Swelling in left foot goes up to ankle and her lower leg. Patient states she has been having swelling since her blood pressure medication was changed to hydralazine 12/25/2018.  Allergies  Allergen Reactions  . Erythromycin Other (See Comments)    Other reaction(s): Headache Headache and GI upset Headache and GI upset  . Losartan Nausea And Vomiting  . Metoprolol Nausea And Vomiting  . Simvastatin Other (See Comments)    Other reaction(s): Muscle Pain Pain lower extremities Pain lower extremities  . Dexlansoprazole Diarrhea  . Lisinopril Cough  . Prednisone     Other reaction(s): Constipation GI upset     Current Outpatient Medications:  .  azelastine (ASTELIN) 0.1 % nasal spray, Place 2 sprays into both nostrils daily., Disp: 30 mL, Rfl: 11 .  Cholecalciferol (VITAMIN D3) 5000 units CAPS, Take 1 capsule by mouth daily., Disp: , Rfl:  .  clotrimazole-betamethasone (LOTRISONE) cream, Apply 1 application topically 2 (two) times daily as needed., Disp: 60 g, Rfl: 1 .  EPINEPHrine 0.3 mg/0.3 mL IJ SOAJ injection, U UTD, Disp: , Rfl:  .  esomeprazole (NEXIUM) 20 MG capsule, Take 20 mg by mouth daily at 12 noon., Disp: , Rfl:  .  hydrALAZINE (APRESOLINE) 25 MG tablet, Take 1 tablet (25 mg total) by mouth 2 (two) times daily., Disp: 60 tablet, Rfl: 5 .  ipratropium (ATROVENT) 0.03 % nasal spray, Place 2 sprays into both nostrils every 8 (eight) hours as needed., Disp: , Rfl: 12 .  loratadine (CLARITIN) 10 MG tablet, Take 2 tablets by mouth daily., Disp: , Rfl:  .  montelukast (SINGULAIR) 10 MG tablet, Take 1 tablet by mouth daily., Disp: , Rfl:  .   Multiple Vitamins-Minerals (EYE VITAMINS PO), Take by mouth., Disp: , Rfl:  .  Naproxen Sodium 220 MG CAPS, Take by mouth., Disp: , Rfl:  .  Omega-3 Fatty Acids (FISH OIL) 1000 MG CAPS, Take 1 capsule by mouth daily., Disp: , Rfl:  .  pramipexole (MIRAPEX) 0.5 MG tablet, Take 1 tablet (0.5 mg total) by mouth at bedtime., Disp: 90 tablet, Rfl: 3 .  senna-docusate (SENOKOT-S) 8.6-50 MG tablet, Take 1-2 tablets by mouth 2 (two) times daily., Disp: 60 tablet, Rfl: 1 .  triamcinolone (NASACORT ALLERGY 24HR) 55 MCG/ACT AERO nasal inhaler, Place 2 sprays into the nose daily., Disp: , Rfl:  .  triamterene-hydrochlorothiazide (MAXZIDE-25) 37.5-25 MG tablet, TAKE 1 TABLET BY MOUTH DAILY, Disp: 90 tablet, Rfl: 0 .  celecoxib (CELEBREX) 200 MG capsule, TAKE 1 CAPSULE(200 MG) BY MOUTH DAILY (Patient not taking: Reported on 05/28/2019), Disp: 30 capsule, Rfl: 2  Review of Systems  Constitutional: Negative.   Respiratory: Negative.   Cardiovascular: Positive for leg swelling. Negative for chest pain and palpitations.  Gastrointestinal: Negative.   Neurological: Negative.     Social History   Tobacco Use  . Smoking status: Former Smoker    Packs/day: 1.00    Years: 10.00    Pack years: 10.00    Types: Cigarettes    Quit date: 11/28/1976    Years since quitting: 42.5  .  Smokeless tobacco: Never Used  Substance Use Topics  . Alcohol use: Yes    Comment: rarely, at holidays      Objective:   BP (!) 171/74 (BP Location: Right Arm, Patient Position: Sitting, Cuff Size: Large)   Pulse 78   Temp 98.4 F (36.9 C) (Oral)   Resp 16   Ht 5\' 1"  (1.549 m)   Wt 250 lb (113.4 kg)   SpO2 96%   BMI 47.24 kg/m  Vitals:   05/28/19 1403  BP: (!) 171/74  Pulse: 78  Resp: 16  Temp: 98.4 F (36.9 C)  TempSrc: Oral  SpO2: 96%  Weight: 250 lb (113.4 kg)  Height: 5\' 1"  (1.549 m)     Physical Exam Vitals signs reviewed.  Constitutional:      General: She is not in acute distress.    Appearance:  Normal appearance. She is well-developed. She is obese. She is not diaphoretic.  Neck:     Musculoskeletal: Normal range of motion and neck supple.     Thyroid: No thyromegaly.     Vascular: No JVD.     Trachea: No tracheal deviation.  Cardiovascular:     Rate and Rhythm: Normal rate and regular rhythm.     Pulses: Normal pulses.     Heart sounds: Normal heart sounds. No murmur. No friction rub. No gallop.   Pulmonary:     Effort: Pulmonary effort is normal. No respiratory distress.     Breath sounds: Normal breath sounds. No wheezing or rales.  Musculoskeletal:     Right lower leg: Edema (2+) present.     Left lower leg: Edema (2+) present.  Lymphadenopathy:     Cervical: No cervical adenopathy.  Neurological:     Mental Status: She is alert.      No results found for any visits on 05/28/19.     Assessment & Plan    1. Essential hypertension Patient had previously been on Amlodipine 5mg  and was changed to hydralazine 25mg  in January secondary to leg swelling. Since the patient feels her legs are swelling worse and would like to go back on the amlodipine instead. Amlodipine refilled as below. I will check labs as below to r/o other sources. If labs are normal will consider using furosemide (with potassium) daily for edema. Also needs to continue wearing compression stockings and elevating legs when at rest.  - amLODipine (NORVASC) 5 MG tablet; Take 1 tablet (5 mg total) by mouth daily.  Dispense: 90 tablet; Refill: 3  2. Leg swelling See above medical treatment plan. - Basic Metabolic Panel (BMET) - B Nat Peptide - furosemide (LASIX) 20 MG tablet; Take 1 tablet (20 mg total) by mouth daily.  Dispense: 30 tablet; Refill: 3 - potassium chloride SA (K-DUR) 20 MEQ tablet; Take when taking furosemide  Dispense: 30 tablet; Refill: Jar[place], PA-C  [facility]

## 2019-05-28 ENCOUNTER — Encounter: Payer: Self-pay | Admitting: Physician Assistant

## 2019-05-28 ENCOUNTER — Ambulatory Visit (INDEPENDENT_AMBULATORY_CARE_PROVIDER_SITE_OTHER): Payer: Medicare Other | Admitting: Physician Assistant

## 2019-05-28 ENCOUNTER — Other Ambulatory Visit: Payer: Self-pay

## 2019-05-28 VITALS — BP 171/74 | HR 78 | Temp 98.4°F | Resp 16 | Ht 61.0 in | Wt 250.0 lb

## 2019-05-28 DIAGNOSIS — M7989 Other specified soft tissue disorders: Secondary | ICD-10-CM

## 2019-05-28 DIAGNOSIS — I10 Essential (primary) hypertension: Secondary | ICD-10-CM

## 2019-05-28 MED ORDER — POTASSIUM CHLORIDE CRYS ER 20 MEQ PO TBCR: EXTENDED_RELEASE_TABLET | ORAL | 3 refills | Status: DC

## 2019-05-28 MED ORDER — FUROSEMIDE 20 MG PO TABS: 20.0000 mg | ORAL_TABLET | Freq: Every day | ORAL | 3 refills | Status: DC

## 2019-05-28 MED ORDER — AMLODIPINE BESYLATE 5 MG PO TABS: 5.0000 mg | ORAL_TABLET | Freq: Every day | ORAL | 3 refills | Status: DC

## 2019-05-28 NOTE — Patient Instructions (Signed)
Furosemide tablets What is this medicine? FUROSEMIDE (fyoor [place] se mide) is a diuretic. It helps you make more urine and to lose salt and excess water from your body. This medicine is used to treat high blood pressure, and edema or swelling from heart, kidney, or liver disease. This medicine may be used for other purposes; ask your health care provider or pharmacist if you have questions. COMMON BRAND NAME(S): Active-Medicated Specimen Kit, Delone, Diuscreen, Lasix, RX Specimen Collection Kit, Specimen Collection Kit, URINX Medicated Specimen Collection What should I tell my health care provider before I take this medicine? They need to know if you have any of these conditions:  abnormal blood electrolytes  diarrhea or vomiting  gout  heart disease  kidney disease, small amounts of urine, or difficulty passing urine  liver disease  thyroid disease  an unusual or allergic reaction to furosemide, sulfa drugs, other medicines, foods, dyes, or preservatives  pregnant or trying to get pregnant  breast-feeding How should I use this medicine? Take this medicine by mouth with a glass of water. Follow the directions on the prescription label. You may take this medicine with or without food. If it upsets your stomach, take it with food or milk. Do not take your medicine more often than directed. Remember that you will need to pass more urine after taking this medicine. Do not take your medicine at a time of day that will cause you problems. Do not take at bedtime. Talk to your pediatrician regarding the use of this medicine in children. While this drug may be prescribed for selected conditions, precautions do apply. Overdosage: If you think you have taken too much of this medicine contact a poison control center or emergency room at once. NOTE: This medicine is only for you. Do not share this medicine with others. What if I miss a dose? If you miss a dose, take it as soon as you can. If it is  almost time for your next dose, take only that dose. Do not take double or extra doses. What may interact with this medicine?  aspirin and aspirin-like medicines  certain antibiotics  chloral hydrate  cisplatin  cyclosporine  digoxin  diuretics  laxatives  lithium  medicines for blood pressure  medicines that relax muscles for surgery  methotrexate  NSAIDs, medicines for pain and inflammation like ibuprofen, naproxen, or indomethacin  phenytoin  steroid medicines like prednisone or cortisone  sucralfate  thyroid hormones This list may not describe all possible interactions. Give your health care provider a list of all the medicines, herbs, non-prescription drugs, or dietary supplements you use. Also tell them if you smoke, drink alcohol, or use illegal drugs. Some items may interact with your medicine. What should I watch for while using this medicine? Visit your doctor or health care provider for regular checks on your progress. Check your blood pressure regularly. Ask your doctor or health care provider what your blood pressure should be, and when you should contact him or her. If you are a diabetic, check your blood sugar as directed. This medicine may cause serious skin reactions. They can happen weeks to months after starting the medicine. Contact your health care provider right away if you notice fevers or flu-like symptoms with a rash. The rash may be red or purple and then turn into blisters or peeling of the skin. Or, you might notice a red rash with swelling of the face, lips or lymph nodes in your neck or under your arms. You   may need to be on a special diet while taking this medicine. Check with your doctor. Also, ask how many glasses of fluid you need to drink a day. You must not get dehydrated. You may get drowsy or dizzy. Do not drive, use machinery, or do anything that needs mental alertness until you know how this drug affects you. Do not stand or sit up  quickly, especially if you are an older patient. This reduces the risk of dizzy or fainting spells. Alcohol can make you more drowsy and dizzy. Avoid alcoholic drinks. This medicine can make you more sensitive to the sun. Keep out of the sun. If you cannot avoid being in the sun, wear protective clothing and use sunscreen. Do not use sun lamps or tanning beds/booths. What side effects may I notice from receiving this medicine? Side effects that you should report to your doctor or health care professional as soon as possible:  blood in urine or stools  dry mouth  fever or chills  hearing loss or ringing in the ears  irregular heartbeat  muscle pain or weakness, cramps  rash, fever, and swollen lymph nodes  redness, blistering, peeling or loosening of the skin, including inside the mouth  skin rash  stomach upset, pain, or nausea  tingling or numbness in the hands or feet  unusually weak or tired  vomiting or diarrhea  yellowing of the eyes or skin Side effects that usually do not require medical attention (report to your doctor or health care professional if they continue or are bothersome):  headache  loss of appetite  unusual bleeding or bruising This list may not describe all possible side effects. Call your doctor for medical advice about side effects. You may report side effects to FDA at 1-800-FDA-1088. Where should I keep my medicine? Keep out of the reach of children. Store at room temperature between 15 and 30 degrees C (59 and 86 degrees F). Protect from light. Throw away any unused medicine after the expiration date. NOTE: This sheet is a summary. It may not cover all possible information. If you have questions about this medicine, talk to your doctor, pharmacist, or health care provider.  2020 Elsevier/Gold Standard (2019-02-16 14:04:13)

## 2019-05-29 ENCOUNTER — Telehealth: Payer: Self-pay

## 2019-05-29 LAB — BASIC METABOLIC PANEL
BUN/Creatinine Ratio: 19 (ref 12–28)
BUN: 21 mg/dL (ref 8–27)
CO2: 22 mmol/L (ref 20–29)
Calcium: 9.5 mg/dL (ref 8.7–10.3)
Chloride: 100 mmol/L (ref 96–106)
Creatinine, Ser: 1.08 mg/dL — ABNORMAL HIGH (ref 0.57–1.00)
GFR calc Af Amer: 57 mL/min/{1.73_m2} — ABNORMAL LOW (ref >59)
GFR calc non Af Amer: 49 mL/min/{1.73_m2} — ABNORMAL LOW (ref >59)
Glucose: 103 mg/dL — ABNORMAL HIGH (ref 65–99)
Potassium: 4.2 mmol/L (ref 3.5–5.2)
Sodium: 138 mmol/L (ref 134–144)

## 2019-05-29 LAB — BRAIN NATRIURETIC PEPTIDE: BNP: 65.8 pg/mL (ref 0.0–100.0)

## 2019-05-29 NOTE — Telephone Encounter (Signed)
LMTCB

## 2019-05-29 NOTE — Telephone Encounter (Signed)
-----   Message from Mar Daring, [place] sent at 05/29/2019  1:54 PM EDT ----- Kidney function is stable and the heart enzyme I talked about was normal.

## 2019-05-30 NOTE — Telephone Encounter (Signed)
Patient advised.

## 2019-06-12 ENCOUNTER — Other Ambulatory Visit: Payer: Self-pay

## 2019-06-12 NOTE — Telephone Encounter (Signed)
Pt called back and is requesting that Dr. B take over managing the following medications:  1. montelukast (SINGULAIR) 10 MG tablet  2. loratadine (CLARITIN) 10 MG tablet  90 day supply  [facility]  Pt stated that Dr. B is aware she takes both medications, one at night and the other in the morning. Please advise. Thanks TNP

## 2019-06-12 NOTE — Telephone Encounter (Signed)
Patient is requesting a refill on her Loratadine 10 mg tablets for 90 days. L.O.V. 05/28/19, please advise.

## 2019-06-12 NOTE — Telephone Encounter (Signed)
Please review.  It looks like the directions on loratadine is 20mg  a day.    Thanks,   -Mickel Baas

## 2019-06-13 MED ORDER — LORATADINE 10 MG PO TABS: 10.0000 mg | ORAL_TABLET | Freq: Every day | ORAL | 3 refills | Status: DC

## 2019-06-13 MED ORDER — MONTELUKAST SODIUM 10 MG PO TABS: 10.0000 mg | ORAL_TABLET | Freq: Every day | ORAL | 1 refills | Status: DC

## 2019-06-18 ENCOUNTER — Other Ambulatory Visit: Payer: Self-pay | Admitting: Family Medicine

## 2019-07-18 ENCOUNTER — Other Ambulatory Visit: Payer: Self-pay | Admitting: Family Medicine

## 2019-07-18 DIAGNOSIS — I10 Essential (primary) hypertension: Secondary | ICD-10-CM

## 2019-07-18 MED ORDER — TRIAMTERENE-HCTZ 37.5-25 MG PO TABS: 1.0000 | ORAL_TABLET | Freq: Every day | ORAL | 0 refills | Status: DC

## 2019-07-18 NOTE — Telephone Encounter (Signed)
[facility] faxed refill request for the following medications:    triamterene-hydrochlorothiazide (MAXZIDE-25) 37.5-25 MG tablet   Please advise.  

## 2019-07-18 NOTE — Telephone Encounter (Signed)
L.O.V. was 05/28/2019 and no upcoming appointment.

## 2019-08-14 ENCOUNTER — Other Ambulatory Visit: Payer: Self-pay | Admitting: Family Medicine

## 2019-08-14 DIAGNOSIS — J3089 Other allergic rhinitis: Secondary | ICD-10-CM

## 2019-08-14 MED ORDER — IPRATROPIUM BROMIDE 0.03 % NA SOLN: 2.0000 | Freq: Three times a day (TID) | NASAL | 0 refills | Status: DC | PRN

## 2019-08-14 NOTE — Telephone Encounter (Signed)
L.O.V. was 05/28/2019.

## 2019-08-14 NOTE — Telephone Encounter (Signed)
[place] faxed refill request for the following medications:  ipratropium (ATROVENT) 0.03 % nasal spray   Please advise.

## 2019-09-04 NOTE — Progress Notes (Signed)
Subjective:   Gloria Mercer is a 78 y.o. female who presents for Medicare Annual (Subsequent) preventive examination.    This visit is being conducted through telemedicine due to the COVID-19 pandemic. This patient has given me verbal consent via doximity to conduct this visit, patient states they are participating from their home address. Some vital signs may be absent or patient reported.    Patient identification: identified by name, DOB, and current address  Review of Systems:  N/A  Cardiac Risk Factors include: advanced age (>65men, >75 women);dyslipidemia;obesity (BMI >30kg/m2)     Objective:     Vitals: There were no vitals taken for this visit.  There is no height or weight on file to calculate BMI. Unable to obtain vitals due to visit being conducted via telephonically.   Advanced Directives 09/05/2019 07/09/2018 07/08/2018 08/29/2017  Does Patient Have a Medical Advance Directive? Yes Yes No No  Type of Paramedic of [place];Living will [place] - -  Does patient want to make changes to medical advance directive? - No - Patient declined - -  Copy of [place] in Chart? Yes - validated most recent copy scanned in chart (See row information) Yes - -  Would patient like information on creating a medical advance directive? - No - Patient declined No - Patient declined -    Tobacco Social History   Tobacco Use  Smoking Status Former Smoker  . Packs/day: 1.00  . Years: 10.00  . Pack years: 10.00  . Types: Cigarettes  . Quit date: 11/28/1976  . Years since quitting: 42.7  Smokeless Tobacco Never Used     Counseling given: Not Answered   Clinical Intake:  Pre-visit preparation completed: Yes  Pain : No/denies pain Pain Score: 0-No pain     Nutritional Risks: None Diabetes: No  How often do you need to have someone help you when you read instructions, pamphlets, or other written materials from  your doctor or pharmacy?: 1 - Never  Interpreter Needed?: No  Information entered by :: [facility], LPN  Past Medical History:  Diagnosis Date  . Allergic rhinitis    on allergy shots, sees Dr. Pryor Ochoa  . Cataract   . GERD (gastroesophageal reflux disease)   . Hypertension   . Macular degeneration    follow[facility] by Dr Garwin Brothers at [facility]  . Osteoarthritis   . Sciatica    Past Surgical History:  Procedure Laterality Date  . CHOLECYSTECTOMY  2002  . TUBAL LIGATION  1978   Family History  Problem Relation Age of Onset  . Hypertension Mother   . Seizures Mother   . Heart disease Father 53       several heart attacks  . Diabetes Father        diet controlled  . Myelodysplastic syndrome Sister   . Prostate cancer Paternal Grandfather   . Breast cancer Neg Hx   . Cervical cancer Neg Hx    Social History   Socioeconomic History  . Marital status: Divorced    Spouse name: Not on file  . Number of children: 1  . Years of education: Not on file  . Highest education level: Bachelor's degree (e.g., BA, AB, BS)  Occupational History  . Occupation: retired in 2001    Comment: social work Scientist, physiological  Social Needs  . Financial resource strain: Not hard at all  . Food insecurity    Worry: Never true    Inability: Never true  .  Transportation needs    Medical: No    Non-medical: No  Tobacco Use  . Smoking status: Former Smoker    Packs/day: 1.00    Years: 10.00    Pack years: 10.00    Types: Cigarettes    Quit date: 11/28/1976    Years since quitting: 42.7  . Smokeless tobacco: Never Used  Substance and Sexual Activity  . Alcohol use: Yes    Comment: rarely, at holidays  . Drug use: No  . Sexual activity: Not Currently  Lifestyle  . Physical activity    Days per week: 0 days    Minutes per session: 0 min  . Stress: Not at all  Relationships  . Social Herbalist on phone: Patient refused    Gets together: Patient refused    Attends religious service:  Patient refused    Active member of club or organization: Patient refused    Attends meetings of clubs or organizations: Patient refused    Relationship status: Patient refused  Other Topics Concern  . Not on file  Social History Narrative  . Not on file    Outpatient Encounter Medications as of 09/05/2019  Medication Sig  . amLODipine (NORVASC) 5 MG tablet Take 1 tablet (5 mg total) by mouth daily.  Marland Kitchen azelastine (ASTELIN) 0.1 % nasal spray Place 2 sprays into both nostrils daily. (Patient taking differently: Place 2 sprays into both nostrils daily. As needed)  . Cholecalciferol (VITAMIN D3) 5000 units CAPS Take 1 capsule by mouth daily.  . clotrimazole-betamethasone (LOTRISONE) cream APPLY EXTERNALLY TO THE AFFECTED AREA TWICE DAILY AS NEEDED  . EPINEPHrine 0.3 mg/0.3 mL IJ SOAJ injection as needed.   Marland Kitchen esomeprazole (NEXIUM) 20 MG capsule Take 40 mg by mouth daily at 12 noon.   . furosemide (LASIX) 20 MG tablet Take 1 tablet (20 mg total) by mouth daily. (Patient taking differently: Take 20 mg by mouth daily. As needed)  . ipratropium (ATROVENT) 0.03 % nasal spray Place 2 sprays into both nostrils every 8 (eight) hours as needed.  . loratadine (CLARITIN) 10 MG tablet Take 1 tablet (10 mg total) by mouth daily.  . montelukast (SINGULAIR) 10 MG tablet Take 1 tablet (10 mg total) by mouth daily.  . Multiple Vitamins-Minerals (EYE VITAMINS PO) Take by mouth daily.   . Naproxen Sodium 220 MG CAPS Take by mouth as needed.   . Omega-3 Fatty Acids (FISH OIL) 1000 MG CAPS Take 1 capsule by mouth daily.  . potassium chloride SA (K-DUR) 20 MEQ tablet Take when taking furosemide  . pramipexole (MIRAPEX) 0.5 MG tablet Take 1 tablet (0.5 mg total) by mouth at bedtime.  Marland Kitchen PREVIDENT 5000 DRY MOUTH 1.1 % GEL dental gel Place 1 application onto teeth. 2-3xs daily  . triamcinolone (NASACORT ALLERGY 24HR) 55 MCG/ACT AERO nasal inhaler Place 2 sprays into the nose daily.  Marland Kitchen triamterene-hydrochlorothiazide  (MAXZIDE-25) 37.5-25 MG tablet Take 1 tablet by mouth daily.  . celecoxib (CELEBREX) 200 MG capsule TAKE 1 CAPSULE(200 MG) BY MOUTH DAILY (Patient not taking: Reported on 05/28/2019)  . senna-docusate (SENOKOT-S) 8.6-50 MG tablet Take 1-2 tablets by mouth 2 (two) times daily. (Patient not taking: Reported on 09/05/2019)   No facility-administered encounter medications on file as of 09/05/2019.     Activities of Daily Living In your present state of health, do you have any difficulty performing the following activities: 09/05/2019  Hearing? N  Vision? Y  Comment Due to MD.  Difficulty concentrating or making  decisions? N  Walking or climbing stairs? Y  Comment Avoid steps.  Dressing or bathing? N  Doing errands, shopping? N  Preparing Food and eating ? N  Using the Toilet? N  In the past six months, have you accidently leaked urine? N  Do you have problems with loss of bowel control? N  Managing your Medications? N  Managing your Finances? N  Housekeeping or managing your Housekeeping? Y  Comment Has assistance cleaning.  Some recent data might be hidden    Patient Care Team: Virginia Crews, MD as PCP - General (Family Medicine)    Assessment:   This is a routine wellness examination for [place].  Exercise Activities and Dietary recommendations Current Exercise Habits: Home exercise routine, Type of exercise: stretching, Time (Minutes): 15, Frequency (Times/Week): 6, Weekly Exercise (Minutes/Week): 90, Intensity: Mild, Exercise limited by: orthopedic condition(s)  Goals    . DIET - REDUCE SUGAR INTAKE     Recommend to cut out sodas, sugar foods and bread to help aid in weight loss.        Fall Risk: Fall Risk  09/05/2019 09/27/2018 09/27/2018 08/29/2017  Falls in the past year? 0 No No Yes  Number falls in past yr: 0 - - 2 or more  Injury with Fall? 0 - - Yes  Risk Factor Category  - - - High Fall Risk  Risk for fall due to : - - - Impaired mobility    FALL RISK  PREVENTION PERTAINING TO THE HOME:  Any stairs in or around the home? No  If so, are there any without handrails? N/A  Home free of loose throw rugs in walkways, pet beds, electrical cords, etc? Yes  Adequate lighting in your home to reduce risk of falls? Yes   ASSISTIVE DEVICES UTILIZED TO PREVENT FALLS:  Life alert? No  Use of a cane, walker or w/c? Yes  Grab bars in the bathroom? Yes  Shower chair or bench in shower? No  Elevated toilet seat or a handicapped toilet? Yes    TIMED UP AND GO:  Was the test performed? No .    Depression Screen PHQ 2/9 Scores 09/05/2019 09/27/2018 08/29/2017  PHQ - 2 Score 1 0 2  PHQ- 9 Score - - 8     Cognitive Function        Immunization History  Administered Date(s) Administered  . Influenza Split 10/27/2010  . Influenza, High Dose Seasonal PF 10/10/2013, 11/06/2014, 09/22/2015, 08/29/2017, 09/27/2018  . Influenza, Seasonal, Injecte, Preservative Fre 08/31/2015  . Influenza,trivalent, recombinat, inj, PF 11/07/2012  . Influenza-Unspecified 11/07/2012, 10/10/2013, 08/31/2015, 09/17/2016, 09/27/2016  . Pneumococcal Conjugate-13 06/25/2014  . Pneumococcal Polysaccharide-23 11/30/2007  . Pneumococcal-Unspecified 05/31/2015  . Tdap 11/08/2008  . Zoster 05/24/2014  . Zoster Recombinat (Shingrix) 07/05/2017, 02/05/2018    Qualifies for Shingles Vaccine? Completed series  Tdap: Although this vaccine is not a covered service during a Wellness Exam, does the patient still wish to receive this vaccine today?  No .   Flu Vaccine: Due for Flu vaccine. Does the patient want to receive this vaccine today?  No .   Pneumococcal Vaccine: Completed series  Screening Tests Health Maintenance  Topic Date Due  . INFLUENZA VACCINE  06/30/2019  . TETANUS/TDAP  12/25/2022 (Originally 11/08/2018)  . DEXA SCAN  Completed  . PNA vac Low Risk Adult  Completed    Cancer Screenings:  Colorectal Screening: No longer required.   Mammogram: No  longer required.   Bone Density: Completed 05/16/13.  Results reflect NORMAL. No repeat needed unless advised by a physician.  Lung Cancer Screening: (Low Dose CT Chest recommended if Age 46-80 years, 30 pack-year currently smoking OR have quit w/in 15years.) does not qualify.   Additional Screening:  Vision Screening: Recommended annual ophthalmology exams for early detection of glaucoma and other disorders of the eye.  Dental Screening: Recommended annual dental exams for proper oral hygiene  Community Resource Referral:  CRR required this visit?  No       Plan:  I have personally reviewed and addressed the Medicare Annual Wellness questionnaire and have noted the following in the patient's chart:  A. Medical and social history B. Use of alcohol, tobacco or illicit drugs  C. Current medications and supplements D. Functional ability and status E.  Nutritional status F.  Physical activity G. Advance directives H. List of other physicians I.  Hospitalizations, surgeries, and ER visits in previous 12 months J.  [place] such as hearing and vision if needed, cognitive and depression L. Referrals and appointments   In addition, I have reviewed and discussed with patient certain preventive protocols, quality metrics, and best practice recommendations. A written personalized care plan for preventive services as well as general preventive health recommendations were provided to patient. Nurse Health Advisor  Signed,    Lonnel Gjerde [place], [place]  [id number] Nurse Health Advisor   Nurse Notes: Pt would like to receive her flu shot at next in office apt.

## 2019-09-05 ENCOUNTER — Ambulatory Visit (INDEPENDENT_AMBULATORY_CARE_PROVIDER_SITE_OTHER): Payer: Medicare Other

## 2019-09-05 ENCOUNTER — Other Ambulatory Visit: Payer: Self-pay

## 2019-09-05 DIAGNOSIS — Z0000 Encounter for general adult medical examination without abnormal findings: Secondary | ICD-10-CM

## 2019-09-05 NOTE — Patient Instructions (Signed)
Gloria Mercer , Thank you for taking time to come for your Medicare Wellness Visit. I appreciate your ongoing commitment to your health goals. Please review the following plan we discussed and let me know if I can assist you in the future.   Screening recommendations/referrals: Colonoscopy: No longer required.  Mammogram: No longer required.  Bone Density: Up to date. Previous DEXA was normal. No repeat needed unless advised by a physician.  Recommended yearly ophthalmology/optometry visit for glaucoma screening and checkup Recommended yearly dental visit for hygiene and checkup  Vaccinations: Influenza vaccine: Currently due Pneumococcal vaccine: Completed series Tdap vaccine: Pt declines today.  Shingles vaccine: Completed series    Advanced directives: Currently on file  Conditions/risks identified: Obesity- recommend to cut out sodas, sugar foods and bread to help aid in weight loss.   Next appointment: 09/26/19 @ 3:40 PM with Dr Brita Romp. Declined scheduling an AWV for 2021 at this time.   Preventive Care 27 Years and Older, Female Preventive care refers to lifestyle choices and visits with your health care provider that can promote health and wellness. What does preventive care include?  A yearly physical exam. This is also called an annual well check.  Dental exams once or twice a year.  Routine eye exams. Ask your health care provider how often you should have your eyes checked.  Personal lifestyle choices, including:  Daily care of your teeth and gums.  Regular physical activity.  Eating a healthy diet.  Avoiding tobacco and drug use.  Limiting alcohol use.  Practicing safe sex.  Taking low-dose aspirin every day.  Taking vitamin and mineral supplements as recommended by your health care provider. What happens during an annual well check? The services and screenings done by your health care provider during your annual well check will depend on your age,  overall health, lifestyle risk factors, and family history of disease. Counseling  Your health care provider may ask you questions about your:  Alcohol use.  Tobacco use.  Drug use.  Emotional well-being.  Home and relationship well-being.  Sexual activity.  Eating habits.  History of falls.  Memory and ability to understand (cognition).  Work and work Statistician.  Reproductive health. Screening  You may have the following tests or measurements:  Height, weight, and BMI.  Blood pressure.  Lipid and cholesterol levels. These may be checked every 5 years, or more frequently if you are over 21 years old.  Skin check.  Lung cancer screening. You may have this screening every year starting at age 53 if you have a 30-pack-year history of smoking and currently smoke or have quit within the past 15 years.  Fecal occult blood test (FOBT) of the stool. You may have this test every year starting at age 20.  Flexible sigmoidoscopy or colonoscopy. You may have a sigmoidoscopy every 5 years or a colonoscopy every 10 years starting at age 73.  Hepatitis C blood test.  Hepatitis B blood test.  Sexually transmitted disease (STD) testing.  Diabetes screening. This is done by checking your blood sugar (glucose) after you have not eaten for a while (fasting). You may have this done every 1-3 years.  Bone density scan. This is done to screen for osteoporosis. You may have this done starting at age 26.  Mammogram. This may be done every 1-2 years. Talk to your health care provider about how often you should have regular mammograms. Talk with your health care provider about your test results, treatment options, and if necessary,  the need for more tests. Vaccines  Your health care provider may recommend certain vaccines, such as:  Influenza vaccine. This is recommended every year.  Tetanus, diphtheria, and acellular pertussis (Tdap, Td) vaccine. You may need a Td booster every 10  years.  Zoster vaccine. You may need this after age 66.  Pneumococcal 13-valent conjugate (PCV13) vaccine. One dose is recommended after age 8.  Pneumococcal polysaccharide (PPSV23) vaccine. One dose is recommended after age 75. Talk to your health care provider about which screenings and vaccines you need and how often you need them. This information is not intended to replace advice given to you by your health care provider. Make sure you discuss any questions you have with your health care provider. Document Released: 12/12/2015 Document Revised: 08/04/2016 Document Reviewed: 09/16/2015 Elsevier Interactive Patient Education  2017 [place] Prevention in the Home Falls can cause injuries. They can happen to people of all ages. There are many things you can do to make your home safe and to help prevent falls. What can I do on the outside of my home?  Regularly fix the edges of walkways and driveways and fix any cracks.  Remove anything that might make you trip as you walk through a door, such as a raised step or threshold.  Trim any bushes or trees on the path to your home.  Use bright outdoor lighting.  Clear any walking paths of anything that might make someone trip, such as rocks or tools.  Regularly check to see if handrails are loose or broken. Make sure that both sides of any steps have handrails.  Any raised decks and porches should have guardrails on the edges.  Have any leaves, snow, or ice cleared regularly.  Use sand or salt on walking paths during winter.  Clean up any spills in your garage right away. This includes oil or grease spills. What can I do in the bathroom?  Use night lights.  Install grab bars by the toilet and in the tub and shower. Do not use towel bars as grab bars.  Use non-skid mats or decals in the tub or shower.  If you need to sit down in the shower, use a plastic, non-slip stool.  Keep the floor dry. Clean up any water that  spills on the floor as soon as it happens.  Remove soap buildup in the tub or shower regularly.  Attach bath mats securely with double-sided non-slip rug tape.  Do not have throw rugs and other things on the floor that can make you trip. What can I do in the bedroom?  Use night lights.  Make sure that you have a light by your bed that is easy to reach.  Do not use any sheets or blankets that are too big for your bed. They should not hang down onto the floor.  Have a firm chair that has side arms. You can use this for support while you get dressed.  Do not have throw rugs and other things on the floor that can make you trip. What can I do in the kitchen?  Clean up any spills right away.  Avoid walking on wet floors.  Keep items that you use a lot in easy-to-reach places.  If you need to reach something above you, use a strong step stool that has a grab bar.  Keep electrical cords out of the way.  Do not use floor polish or wax that makes floors slippery. If you must use  wax, use non-skid floor wax.  Do not have throw rugs and other things on the floor that can make you trip. What can I do with my stairs?  Do not leave any items on the stairs.  Make sure that there are handrails on both sides of the stairs and use them. Fix handrails that are broken or loose. Make sure that handrails are as long as the stairways.  Check any carpeting to make sure that it is firmly attached to the stairs. Fix any carpet that is loose or worn.  Avoid having throw rugs at the top or bottom of the stairs. If you do have throw rugs, attach them to the floor with carpet tape.  Make sure that you have a light switch at the top of the stairs and the bottom of the stairs. If you do not have them, ask someone to add them for you. What else can I do to help prevent falls?  Wear shoes that:  Do not have high heels.  Have rubber bottoms.  Are comfortable and fit you well.  Are closed at the  toe. Do not wear sandals.  If you use a stepladder:  Make sure that it is fully opened. Do not climb a closed stepladder.  Make sure that both sides of the stepladder are locked into place.  Ask someone to hold it for you, if possible.  Clearly mark and make sure that you can see:  Any grab bars or handrails.  First and last steps.  Where the edge of each step is.  Use tools that help you move around (mobility aids) if they are needed. These include:  Canes.  Walkers.  Scooters.  Crutches.  Turn on the lights when you go into a dark area. Replace any light bulbs as soon as they burn out.  Set up your furniture so you have a clear path. Avoid moving your furniture around.  If any of your floors are uneven, fix them.  If there are any pets around you, be aware of where they are.  Review your medicines with your doctor. Some medicines can make you feel dizzy. This can increase your chance of falling. Ask your doctor what other things that you can do to help prevent falls. This information is not intended to replace advice given to you by your health care provider. Make sure you discuss any questions you have with your health care provider. Document Released: 09/11/2009 Document Revised: 04/22/2016 Document Reviewed: 12/20/2014 Elsevier Interactive Patient Education  2017 [employer].

## 2019-09-26 ENCOUNTER — Other Ambulatory Visit: Payer: Self-pay

## 2019-09-26 ENCOUNTER — Ambulatory Visit (INDEPENDENT_AMBULATORY_CARE_PROVIDER_SITE_OTHER): Payer: Medicare Other | Admitting: Family Medicine

## 2019-09-26 ENCOUNTER — Encounter: Payer: Self-pay | Admitting: Family Medicine

## 2019-09-26 VITALS — BP 135/72 | HR 88 | Temp 96.6°F | Wt 253.0 lb

## 2019-09-26 DIAGNOSIS — I890 Lymphedema, not elsewhere classified: Secondary | ICD-10-CM

## 2019-09-26 DIAGNOSIS — D229 Melanocytic nevi, unspecified: Secondary | ICD-10-CM

## 2019-09-26 DIAGNOSIS — R600 Localized edema: Secondary | ICD-10-CM | POA: Diagnosis not present

## 2019-09-26 DIAGNOSIS — J3089 Other allergic rhinitis: Secondary | ICD-10-CM | POA: Diagnosis not present

## 2019-09-26 NOTE — Patient Instructions (Signed)

## 2019-09-26 NOTE — Progress Notes (Signed)
Patient: Gloria Mercer Female    DOB: 02/23/1941   78 y.o.   MRN: [id number] Visit Date: 09/26/2019  Today's Provider: Lavon Paganini, MD   Chief Complaint  Patient presents with   Edema   Subjective:     HPI    Edema: Patient complains of edema. The location of the edema is lower leg(s) bilateral, ankle(s) bilateral, feet bilateral.  Left worse than right.   Onset of symptoms was several months ago, unchanged since that time. The edema is present all day, but pt reports it gets worse in the evenings.  The patient states the problem is long-standing.  The swelling has been aggravated by hot weather and being on her feet alot., relieved by diuretics, elevation of involved area, and been associated with weight gain. Cardiac risk factors include advanced age (older than 23 for men, 26 for women), obesity (BMI >= 30 kg/m2) and sedentary lifestyle.  States that she will not try compression stockings as she does not believe that she be able to tolerate them as she does have some pain with the blood pressure cuff blowing up on her arm.  She also does not believe that she be able to tolerate lymphedema pumps for the same reason.  Patient was taking Lasix as needed, but states that she was not tolerating the size of the pill required for the potassium supplement.  She also does not feel that it was helping at all.   States she is having more allergic symptoms.  This is a chronic and intermittent problem.  She has been unable to get her allergy shots for >1 yr as office in [place] has not sent the serum.  She is wondering if she can see someone locally for allergy injections.  She continues to take Singulair, antihistamine, and multiple nasal sprays to help with this.   Allergies  Allergen Reactions   Erythromycin Other (See Comments)    Other reaction(s): Headache Headache and GI upset Headache and GI upset   Losartan Nausea And Vomiting   Metoprolol Nausea And Vomiting    Simvastatin Other (See Comments)    Other reaction(s): Muscle Pain Pain lower extremities Pain lower extremities   Dexlansoprazole Diarrhea   Lisinopril Cough   Prednisone     Other reaction(s): Constipation GI upset     Current Outpatient Medications:    amLODipine (NORVASC) 5 MG tablet, Take 1 tablet (5 mg total) by mouth daily., Disp: 90 tablet, Rfl: 3   azelastine (ASTELIN) 0.1 % nasal spray, Place 2 sprays into both nostrils daily. (Patient taking differently: Place 2 sprays into both nostrils daily. As needed), Disp: 30 mL, Rfl: 11   Cholecalciferol (VITAMIN D3) 5000 units CAPS, Take 1 capsule by mouth daily., Disp: , Rfl:    clotrimazole-betamethasone (LOTRISONE) cream, APPLY EXTERNALLY TO THE AFFECTED AREA TWICE DAILY AS NEEDED, Disp: 60 g, Rfl: 1   EPINEPHrine 0.3 mg/0.3 mL IJ SOAJ injection, as needed. , Disp: , Rfl:    esomeprazole (NEXIUM) 20 MG capsule, Take 40 mg by mouth daily at 12 noon. , Disp: , Rfl:    furosemide (LASIX) 20 MG tablet, Take 1 tablet (20 mg total) by mouth daily. (Patient taking differently: Take 20 mg by mouth daily. As needed), Disp: 30 tablet, Rfl: 3   ipratropium (ATROVENT) 0.03 % nasal spray, Place 2 sprays into both nostrils every 8 (eight) hours as needed., Disp: 30 mL, Rfl: 0   loratadine (CLARITIN) 10 MG tablet, Take 1 tablet (  10 mg total) by mouth daily., Disp: 90 tablet, Rfl: 3   montelukast (SINGULAIR) 10 MG tablet, Take 1 tablet (10 mg total) by mouth daily., Disp: 90 tablet, Rfl: 1   Multiple Vitamins-Minerals (EYE VITAMINS PO), Take by mouth daily. , Disp: , Rfl:    Naproxen Sodium 220 MG CAPS, Take by mouth as needed. , Disp: , Rfl:    Omega-3 Fatty Acids (FISH OIL) 1000 MG CAPS, Take 1 capsule by mouth daily., Disp: , Rfl:    potassium chloride SA (K-DUR) 20 MEQ tablet, Take when taking furosemide, Disp: 30 tablet, Rfl: 3   pramipexole (MIRAPEX) 0.5 MG tablet, Take 1 tablet (0.5 mg total) by mouth at bedtime., Disp: 90  tablet, Rfl: 3   PREVIDENT 5000 DRY MOUTH 1.1 % GEL dental gel, Place 1 application onto teeth. 2-3xs daily, Disp: , Rfl:    triamcinolone (NASACORT ALLERGY 24HR) 55 MCG/ACT AERO nasal inhaler, Place 2 sprays into the nose daily., Disp: , Rfl:    triamterene-hydrochlorothiazide (MAXZIDE-25) 37.5-25 MG tablet, Take 1 tablet by mouth daily., Disp: 90 tablet, Rfl: 0   celecoxib (CELEBREX) 200 MG capsule, TAKE 1 CAPSULE(200 MG) BY MOUTH DAILY (Patient not taking: Reported on 05/28/2019), Disp: 30 capsule, Rfl: 2   senna-docusate (SENOKOT-S) 8.6-50 MG tablet, Take 1-2 tablets by mouth 2 (two) times daily. (Patient not taking: Reported on 09/26/2019), Disp: 60 tablet, Rfl: 1  Review of Systems  Constitutional: Positive for fatigue. Negative for activity change, appetite change, chills, diaphoresis, fever and unexpected weight change.  HENT: Positive for congestion, postnasal drip, sinus pressure, sinus pain, sneezing and sore throat. Negative for ear discharge, ear pain, hearing loss, tinnitus and trouble swallowing.        Pt reports having trouble with env allergies.   Respiratory: Positive for cough and shortness of breath. Negative for apnea, choking, chest tightness, wheezing and stridor.        Pt states this is a chronic issue.     Cardiovascular: Positive for leg swelling. Negative for chest pain and palpitations.  Gastrointestinal: Positive for diarrhea and nausea. Negative for abdominal distention, abdominal pain, anal bleeding, blood in stool, constipation, rectal pain and vomiting.  Neurological: Positive for headaches. Negative for dizziness and light-headedness.    Social History   Tobacco Use   Smoking status: Former Smoker    Packs/day: 1.00    Years: 10.00    Pack years: 10.00    Types: Cigarettes    Quit date: 11/28/1976    Years since quitting: 42.8   Smokeless tobacco: Never Used  Substance Use Topics   Alcohol use: Yes    Comment: rarely, at holidays        Objective:   BP 135/72 (BP Location: Right Wrist, Patient Position: Sitting, Cuff Size: Normal)    Pulse 88    Temp (!) 96.6 F (35.9 C) (Temporal)    Wt 253 lb (114.8 kg)    BMI 47.80 kg/m  Vitals:   09/26/19 1539 09/26/19 1552  BP: (!) 149/82 135/72  Pulse: 88   Temp: (!) 96.6 F (35.9 C)   TempSrc: Temporal   Weight: 253 lb (114.8 kg)   Body mass index is 47.8 kg/m.   Physical Exam Vitals signs reviewed.  Constitutional:      General: She is not in acute distress.    Appearance: Normal appearance. She is obese.  HENT:     Head: Normocephalic and atraumatic.  Eyes:     Conjunctiva/sclera: Conjunctivae normal.  Neck:  Musculoskeletal: Neck supple.  Cardiovascular:     Rate and Rhythm: Normal rate and regular rhythm.     Pulses: Normal pulses.     Heart sounds: Normal heart sounds. No murmur.  Pulmonary:     Effort: Pulmonary effort is normal. No respiratory distress.     Breath sounds: Normal breath sounds. No wheezing.  Musculoskeletal:     Right lower leg: Edema (3+) present.     Left lower leg: Edema (3+ with slightly more than R side chronically) present.  Lymphadenopathy:     Cervical: No cervical adenopathy.  Skin:    General: Skin is warm and dry.     Findings: No rash.     Comments: Small black lesion on right shin with irregular borders  Neurological:     Mental Status: She is alert and oriented to person, place, and time. Mental status is at baseline.     Sensory: No sensory deficit.  Psychiatric:        Mood and Affect: Mood normal.        Behavior: Behavior normal.      No results found for any visits on 09/26/19.     Assessment & Plan   1. Pedal edema 2. Lymphedema -This is a chronic and very longstanding issue -Suspect this is multifactorial, related to swelling of the left knee, venous insufficiency related to obesity and age, immobility with sitting with legs hanging down frequently, and medication side effects -Patient knows that  amlodipine can contribute to her swelling -She believes that her benefit from this medication outweigh the swelling that she is having -She does not want to wear compression stockings, but she did accept a prescription for them today and states that she will go to the medical supply store to have them fitted -No signs of volume overload elsewhere -Seems to have some lymphedema as well, but states she would not tolerate lymphedema pumps -She tried Lasix previously without any relief, so we will discontinue this -Also encouraged leg elevation  3. Non-seasonal allergic rhinitis, unspecified trigger -Chronic and longstanding issue -Patient is currently on multiple medications for this -She was previously receiving allergy shots which were helping, but has been I am able to get these since moving -We will refer to local allergist - Ambulatory referral to Allergy  4. Morbid obesity ([place]) -Discussed with patient that her obesity contributes to her lower extremity edema -Encouraged exercise  5. Atypical nevus -Noted incidentally on exam on her right shin -Unable to tell if this is from a traumatic injury or if it is truly a nevus -Patient believes it has been there for more than a year, so suspect it may actually be a nevus -Patient will call her dermatologist, Dr. Kellie Moor for further evaluation    No orders of the defined types were placed in this encounter.    Return in about 3 weeks (around 10/17/2019) for flu shot.   The entirety of the information documented in the History of Present Illness, Review of Systems and Physical Exam were personally obtained by me. Portions of this information were initially documented by Ashley Royalty, CMA and reviewed by me for thoroughness and accuracy.    Warnie Belair, Dionne Bucy, MD MPH [facility]

## 2019-10-01 DIAGNOSIS — H353212 Exudative age-related macular degeneration, right eye, with inactive choroidal neovascularization: Secondary | ICD-10-CM | POA: Diagnosis not present

## 2019-10-01 DIAGNOSIS — H353122 Nonexudative age-related macular degeneration, left eye, intermediate dry stage: Secondary | ICD-10-CM | POA: Diagnosis not present

## 2019-10-18 ENCOUNTER — Telehealth: Payer: Self-pay

## 2019-10-18 ENCOUNTER — Ambulatory Visit: Payer: Medicare Other | Admitting: Family Medicine

## 2019-10-18 NOTE — Telephone Encounter (Signed)
These are chronic issues.  As long as she has not had any change in them or fevers, she is okay to come in for flu shot

## 2019-10-18 NOTE — Telephone Encounter (Signed)
Copied from [place] [id number]. Topic: Appointment Scheduling - Scheduling Inquiry for Clinic >> Oct 18, 2019  9:32 AM Gloria Mercer wrote: Patient calling to reschedule appointment- While using DT, patient noted that she has had cough and slight SOB but states that is not new for her, as she has known allergies. Patient inquired if she is still able to come into office. Please advise.

## 2019-10-18 NOTE — Telephone Encounter (Signed)
Please advise. Looks like patient was to come in for a flu vaccine.

## 2019-10-18 NOTE — Telephone Encounter (Signed)
Left message to call back. PEC please advise as below.

## 2019-10-18 NOTE — Progress Notes (Deleted)
{Method of visit:23308}  Patient: Gloria Mercer Female    DOB: 05-05-1941   78 y.o.   MRN: [id number] Visit Date: 10/18/2019  Today's Provider: Lavon Paganini, MD   No chief complaint on file.  Subjective:     HPI  Allergies  Allergen Reactions  . Erythromycin Other (See Comments)    Other reaction(s): Headache Headache and GI upset Headache and GI upset  . Losartan Nausea And Vomiting  . Metoprolol Nausea And Vomiting  . Simvastatin Other (See Comments)    Other reaction(s): Muscle Pain Pain lower extremities Pain lower extremities  . Dexlansoprazole Diarrhea  . Lisinopril Cough  . Prednisone     Other reaction(s): Constipation GI upset     Current Outpatient Medications:  .  amLODipine (NORVASC) 5 MG tablet, Take 1 tablet (5 mg total) by mouth daily., Disp: 90 tablet, Rfl: 3 .  azelastine (ASTELIN) 0.1 % nasal spray, Place 2 sprays into both nostrils daily. (Patient taking differently: Place 2 sprays into both nostrils daily. As needed), Disp: 30 mL, Rfl: 11 .  Cholecalciferol (VITAMIN D3) 5000 units CAPS, Take 1 capsule by mouth daily., Disp: , Rfl:  .  clotrimazole-betamethasone (LOTRISONE) cream, APPLY EXTERNALLY TO THE AFFECTED AREA TWICE DAILY AS NEEDED, Disp: 60 g, Rfl: 1 .  EPINEPHrine 0.3 mg/0.3 mL IJ SOAJ injection, as needed. , Disp: , Rfl:  .  esomeprazole (NEXIUM) 20 MG capsule, Take 40 mg by mouth daily at 12 noon. , Disp: , Rfl:  .  ipratropium (ATROVENT) 0.03 % nasal spray, Place 2 sprays into both nostrils every 8 (eight) hours as needed., Disp: 30 mL, Rfl: 0 .  loratadine (CLARITIN) 10 MG tablet, Take 1 tablet (10 mg total) by mouth daily., Disp: 90 tablet, Rfl: 3 .  montelukast (SINGULAIR) 10 MG tablet, Take 1 tablet (10 mg total) by mouth daily., Disp: 90 tablet, Rfl: 1 .  Multiple Vitamins-Minerals (EYE VITAMINS PO), Take by mouth daily. , Disp: , Rfl:  .  Naproxen Sodium 220 MG CAPS, Take by mouth as needed. , Disp: , Rfl:  .  Omega-3  Fatty Acids (FISH OIL) 1000 MG CAPS, Take 1 capsule by mouth daily., Disp: , Rfl:  .  pramipexole (MIRAPEX) 0.5 MG tablet, Take 1 tablet (0.5 mg total) by mouth at bedtime., Disp: 90 tablet, Rfl: 3 .  PREVIDENT 5000 DRY MOUTH 1.1 % GEL dental gel, Place 1 application onto teeth. 2-3xs daily, Disp: , Rfl:  .  triamcinolone (NASACORT ALLERGY 24HR) 55 MCG/ACT AERO nasal inhaler, Place 2 sprays into the nose daily., Disp: , Rfl:  .  triamterene-hydrochlorothiazide (MAXZIDE-25) 37.5-25 MG tablet, Take 1 tablet by mouth daily., Disp: 90 tablet, Rfl: 0  Review of Systems  Constitutional: Negative for appetite change, chills, fatigue and fever.  Respiratory: Negative for chest tightness and shortness of breath.   Cardiovascular: Negative for chest pain and palpitations.  Gastrointestinal: Negative for abdominal pain, nausea and vomiting.  Neurological: Negative for dizziness and weakness.    Social History   Tobacco Use  . Smoking status: Former Smoker    Packs/day: 1.00    Years: 10.00    Pack years: 10.00    Types: Cigarettes    Quit date: 11/28/1976    Years since quitting: 42.9  . Smokeless tobacco: Never Used  Substance Use Topics  . Alcohol use: Yes    Comment: rarely, at holidays      Objective:   There were no vitals taken for this  visit. There were no vitals filed for this visit.There is no height or weight on file to calculate BMI.   Physical Exam   No results found for any visits on 10/18/19.     Assessment & Plan        Lavon Paganini, MD  [facility]

## 2019-10-19 ENCOUNTER — Ambulatory Visit: Payer: Self-pay | Admitting: Family Medicine

## 2019-10-19 NOTE — Telephone Encounter (Signed)
Scheduled for 10/29/2019 1:20 pm

## 2019-10-27 DIAGNOSIS — K136 Irritative hyperplasia of oral mucosa: Secondary | ICD-10-CM | POA: Diagnosis not present

## 2019-10-29 ENCOUNTER — Ambulatory Visit: Payer: Medicare Other | Admitting: Family Medicine

## 2019-10-29 ENCOUNTER — Ambulatory Visit: Payer: Self-pay | Admitting: Family Medicine

## 2019-10-30 ENCOUNTER — Ambulatory Visit: Payer: Self-pay | Admitting: Family Medicine

## 2019-11-05 ENCOUNTER — Other Ambulatory Visit: Payer: Self-pay | Admitting: Family Medicine

## 2019-11-05 DIAGNOSIS — I10 Essential (primary) hypertension: Secondary | ICD-10-CM

## 2019-11-05 MED ORDER — TRIAMTERENE-HCTZ 37.5-25 MG PO TABS: 1.0000 | ORAL_TABLET | Freq: Every day | ORAL | 1 refills | Status: DC

## 2019-11-05 NOTE — Telephone Encounter (Signed)
[facility] faxed refill request for the following medications:  triamterene-hydrochlorothiazide (MAXZIDE-25) 37.5-25 MG tablet - 90 day     Please advise.  Thanks, [employer]

## 2019-12-04 ENCOUNTER — Telehealth: Payer: Self-pay | Admitting: Family Medicine

## 2019-12-04 NOTE — Telephone Encounter (Signed)
Pt has been experiencing knee pain in her left knee and was advised by a friend to try VOLTAREN arthritis pain relief cream/ pt wants Dr. Sharmaine Base opinion on using this cream/ please advise

## 2019-12-05 ENCOUNTER — Other Ambulatory Visit: Payer: Self-pay

## 2019-12-05 ENCOUNTER — Ambulatory Visit (INDEPENDENT_AMBULATORY_CARE_PROVIDER_SITE_OTHER): Payer: Medicare Other | Admitting: Physician Assistant

## 2019-12-05 DIAGNOSIS — Z23 Encounter for immunization: Secondary | ICD-10-CM | POA: Diagnosis not present

## 2019-12-05 NOTE — Telephone Encounter (Signed)
Patient advised as below.  

## 2019-12-05 NOTE — Telephone Encounter (Signed)
Voltaren gel is fine to use and is now OTC.  Some does absorb, though, so not recommended to take with oral NSAIDs.

## 2019-12-06 ENCOUNTER — Ambulatory Visit: Payer: Medicare Other | Admitting: Family Medicine

## 2019-12-10 ENCOUNTER — Other Ambulatory Visit: Payer: Self-pay | Admitting: Family Medicine

## 2019-12-10 MED ORDER — MONTELUKAST SODIUM 10 MG PO TABS: 10.0000 mg | ORAL_TABLET | Freq: Every day | ORAL | 1 refills | Status: DC

## 2019-12-10 NOTE — Telephone Encounter (Signed)
[facility] faxed refill request for the following medications:   montelukast (SINGULAIR) 10 MG tablet   Please advise.  

## 2020-01-22 ENCOUNTER — Other Ambulatory Visit: Payer: Self-pay | Admitting: Family Medicine

## 2020-01-22 MED ORDER — PRAMIPEXOLE DIHYDROCHLORIDE 0.5 MG PO TABS: 0.5000 mg | ORAL_TABLET | Freq: Every day | ORAL | 3 refills | Status: DC

## 2020-01-22 NOTE — Telephone Encounter (Signed)
[facility] faxed refill request for the following medications: ° °pramipexole (MIRAPEX) 0.5 MG tablet ° ° °Please advise. ° °Thanks, °[facility] ° °

## 2020-03-18 NOTE — Progress Notes (Deleted)
    Established patient visit    Patient: Gloria Mercer   DOB: 1941/01/14   80 y.o. Female  MRN: [id number] Visit Date: 03/18/2020  Today's healthcare provider: Lavon Paganini, MD   No chief complaint on file.  Subjective    HPI ***  {Show patient history (optional):23778::" "}   Medications: Outpatient Medications Prior to Visit  Medication Sig  . amLODipine (NORVASC) 5 MG tablet Take 1 tablet (5 mg total) by mouth daily.  Marland Kitchen azelastine (ASTELIN) 0.1 % nasal spray Place 2 sprays into both nostrils daily. (Patient taking differently: Place 2 sprays into both nostrils daily. As needed)  . Cholecalciferol (VITAMIN D3) 5000 units CAPS Take 1 capsule by mouth daily.  . clotrimazole-betamethasone (LOTRISONE) cream APPLY EXTERNALLY TO THE AFFECTED AREA TWICE DAILY AS NEEDED  . EPINEPHrine 0.3 mg/0.3 mL IJ SOAJ injection as needed.   Marland Kitchen esomeprazole (NEXIUM) 20 MG capsule Take 40 mg by mouth daily at 12 noon.   Marland Kitchen ipratropium (ATROVENT) 0.03 % nasal spray Place 2 sprays into both nostrils every 8 (eight) hours as needed.  . loratadine (CLARITIN) 10 MG tablet Take 1 tablet (10 mg total) by mouth daily.  . montelukast (SINGULAIR) 10 MG tablet Take 1 tablet (10 mg total) by mouth daily.  . Multiple Vitamins-Minerals (EYE VITAMINS PO) Take by mouth daily.   . Naproxen Sodium 220 MG CAPS Take by mouth as needed.   . Omega-3 Fatty Acids (FISH OIL) 1000 MG CAPS Take 1 capsule by mouth daily.  . pramipexole (MIRAPEX) 0.5 MG tablet Take 1 tablet (0.5 mg total) by mouth at bedtime.  Marland Kitchen PREVIDENT 5000 DRY MOUTH 1.1 % GEL dental gel Place 1 application onto teeth. 2-3xs daily  . triamcinolone (NASACORT ALLERGY 24HR) 55 MCG/ACT AERO nasal inhaler Place 2 sprays into the nose daily.  Marland Kitchen triamterene-hydrochlorothiazide (MAXZIDE-25) 37.5-25 MG tablet Take 1 tablet by mouth daily.   No facility-administered medications prior to visit.    Review of Systems  {Show previous labs (optional):23779::"  "}   Objective    There were no vitals taken for this visit. {Show previous vital signs (optional):23777::" "}  Physical Exam  ***  No results found for any visits on 03/24/20.   Assessment & Plan    ***  No follow-ups on file.      {provider attestation***:1}   Lavon Paganini, MD  [facility] [phone] (phone) [phone] (fax)  [place]

## 2020-03-19 DIAGNOSIS — L298 Other pruritus: Secondary | ICD-10-CM | POA: Diagnosis not present

## 2020-03-19 DIAGNOSIS — D2261 Melanocytic nevi of right upper limb, including shoulder: Secondary | ICD-10-CM | POA: Diagnosis not present

## 2020-03-19 DIAGNOSIS — D2272 Melanocytic nevi of left lower limb, including hip: Secondary | ICD-10-CM | POA: Diagnosis not present

## 2020-03-19 DIAGNOSIS — D2262 Melanocytic nevi of left upper limb, including shoulder: Secondary | ICD-10-CM | POA: Diagnosis not present

## 2020-03-19 DIAGNOSIS — L538 Other specified erythematous conditions: Secondary | ICD-10-CM | POA: Diagnosis not present

## 2020-03-19 DIAGNOSIS — D225 Melanocytic nevi of trunk: Secondary | ICD-10-CM | POA: Diagnosis not present

## 2020-03-19 DIAGNOSIS — D485 Neoplasm of uncertain behavior of skin: Secondary | ICD-10-CM | POA: Diagnosis not present

## 2020-03-19 DIAGNOSIS — D2271 Melanocytic nevi of right lower limb, including hip: Secondary | ICD-10-CM | POA: Diagnosis not present

## 2020-03-19 DIAGNOSIS — D1721 Benign lipomatous neoplasm of skin and subcutaneous tissue of right arm: Secondary | ICD-10-CM | POA: Diagnosis not present

## 2020-03-19 DIAGNOSIS — D1722 Benign lipomatous neoplasm of skin and subcutaneous tissue of left arm: Secondary | ICD-10-CM | POA: Diagnosis not present

## 2020-03-19 DIAGNOSIS — L820 Inflamed seborrheic keratosis: Secondary | ICD-10-CM | POA: Diagnosis not present

## 2020-03-24 ENCOUNTER — Ambulatory Visit: Payer: Medicare Other | Admitting: Family Medicine

## 2020-03-31 DIAGNOSIS — H353212 Exudative age-related macular degeneration, right eye, with inactive choroidal neovascularization: Secondary | ICD-10-CM | POA: Diagnosis not present

## 2020-03-31 DIAGNOSIS — H353122 Nonexudative age-related macular degeneration, left eye, intermediate dry stage: Secondary | ICD-10-CM | POA: Diagnosis not present

## 2020-05-02 ENCOUNTER — Other Ambulatory Visit: Payer: Self-pay | Admitting: Family Medicine

## 2020-05-02 NOTE — Telephone Encounter (Signed)
Requested medication (s) are due for refill today: no  Requested medication (s) are on the active medication list: yes  Last refill:  12/09/2019  Future visit scheduled: yes  Notes to clinic:    Medication not assigned to a protocol, review manually     Requested Prescriptions  Pending Prescriptions Disp Refills   clotrimazole-betamethasone (LOTRISONE) cream [Pharmacy Med Name: CLOTRIMAZOLE-BETAMETHASONE CRM 15GM] 60 g 1    Sig: APPLY EXTERNALLY TO THE AFFECTED AREA TWICE DAILY AS NEEDED      Off-Protocol Failed - 05/02/2020 12:54 PM      Failed - Medication not assigned to a protocol, review manually.      Passed - Valid encounter within last 12 months    Recent Outpatient Visits           7 months ago Pedal edema   [facility] [place], Dionne Bucy, MD   11 months ago Essential hypertension   [place], Clearnce Sorrel, [place]   1 year ago Essential hypertension   [facility], Dionne Bucy, MD   1 year ago Primary osteoarthritis of both [place], Dionne Bucy, MD   1 year ago Intractable back pain   [place], Dionne Bucy, MD       Future Appointments             In 3 days [name], Dionne Bucy, MD [facility], [place]

## 2020-05-05 ENCOUNTER — Encounter: Payer: Self-pay | Admitting: Family Medicine

## 2020-05-05 ENCOUNTER — Other Ambulatory Visit: Payer: Self-pay

## 2020-05-05 ENCOUNTER — Ambulatory Visit (INDEPENDENT_AMBULATORY_CARE_PROVIDER_SITE_OTHER): Payer: Medicare Other | Admitting: Family Medicine

## 2020-05-05 VITALS — BP 144/77 | HR 83 | Temp 96.8°F | Resp 16 | Ht 60.0 in | Wt 254.0 lb

## 2020-05-05 DIAGNOSIS — R7303 Prediabetes: Secondary | ICD-10-CM | POA: Diagnosis not present

## 2020-05-05 DIAGNOSIS — D692 Other nonthrombocytopenic purpura: Secondary | ICD-10-CM | POA: Diagnosis not present

## 2020-05-05 DIAGNOSIS — R110 Nausea: Secondary | ICD-10-CM | POA: Diagnosis not present

## 2020-05-05 DIAGNOSIS — M25561 Pain in right knee: Secondary | ICD-10-CM | POA: Diagnosis not present

## 2020-05-05 DIAGNOSIS — I10 Essential (primary) hypertension: Secondary | ICD-10-CM

## 2020-05-05 DIAGNOSIS — G8929 Other chronic pain: Secondary | ICD-10-CM | POA: Insufficient documentation

## 2020-05-05 DIAGNOSIS — R600 Localized edema: Secondary | ICD-10-CM

## 2020-05-05 DIAGNOSIS — E782 Mixed hyperlipidemia: Secondary | ICD-10-CM | POA: Diagnosis not present

## 2020-05-05 DIAGNOSIS — H353210 Exudative age-related macular degeneration, right eye, stage unspecified: Secondary | ICD-10-CM | POA: Diagnosis not present

## 2020-05-05 NOTE — Assessment & Plan Note (Signed)
Reviewed last lipid panel Not currently on a statin Recheck FLP and CMP Discussed diet and exercise  

## 2020-05-05 NOTE — Assessment & Plan Note (Signed)
New problem Patient denies any vomiting Continue esomeprazole and take OTC Tums

## 2020-05-05 NOTE — Assessment & Plan Note (Addendum)
Blood pressure elevated today Knee pain may be contributing Well-controlled at home Continue Maxide at current dose Stop amlodipine given lower extremity edema Start hydralazine 25 mg 3 times daily instead Recheck metabolic panel

## 2020-05-05 NOTE — Assessment & Plan Note (Signed)
Discussed importance of healthy weight management Discussed diet and exercise  

## 2020-05-05 NOTE — Progress Notes (Signed)
Established patient visit   Patient: Gloria Mercer   DOB: [birth year]   79 y.o. Female  MRN: [id number] Visit Date: 05/05/2020  I,Sulibeya S Dimas,acting as a scribe for Lavon Paganini, MD.,have documented all relevant documentation on the behalf of Lavon Paganini, MD,as directed by  Lavon Paganini, MD while in the presence of Lavon Paganini, MD.  Today's healthcare provider: Lavon Paganini, MD   Chief Complaint  Patient presents with  . Hypertension  . Knee Pain   Subjective    HPI Hypertension, follow-up  BP Readings from Last 3 Encounters:  05/05/20 (!) 144/77  09/26/19 135/72  05/28/19 (!) 171/74   Wt Readings from Last 3 Encounters:  05/05/20 254 lb (115.2 kg)  09/26/19 253 lb (114.8 kg)  05/28/19 250 lb (113.4 kg)     She was last seen for hypertension over a year ago.  BP at that visit was 139/65. Management since that visit includes DC amlodipine, continue Maxide, and start hydralazine 25 BID.  She reports good compliance with treatment. She is not having side effects.  She is following a Regular diet. She is not exercising. She does not smoke.  Use of agents associated with hypertension: none.   Outside blood pressures are not being checked. Symptoms: No chest pain No chest pressure  No palpitations No syncope  No dyspnea No orthopnea  No paroxysmal nocturnal dyspnea Yes lower extremity edema   Pertinent labs: Lab Results  Component Value Date   CHOL 228 (H) 05/05/2020   HDL 81 05/05/2020   LDLCALC 125 (H) 05/05/2020   TRIG 125 05/05/2020   CHOLHDL 3.1 06/22/2018   Lab Results  Component Value Date   NA 138 05/05/2020   K 4.3 05/05/2020   CREATININE 1.22 (H) 05/05/2020   GFRNONAA 42 (L) 05/05/2020   GFRAA 49 (L) 05/05/2020   GLUCOSE 117 (H) 05/05/2020     The 10-year ASCVD risk score Mikey Bussing DC Jr., et al., 2013) is: 39.5%    --------------------------------------------------------------------------------------------------- Patient C/O worsening right knee pain. Patient reports she has not been able to have surgery due to weight.    Patient Active Problem List   Diagnosis Date Noted  . Senile purpura ([place]) 05/06/2020  . Chronic pain of right knee 05/05/2020  . Nausea 05/05/2020  . Chronic back pain 07/09/2018  . Constipation 01/12/2018  . RLS (restless legs syndrome) 08/29/2017  . Pedal edema 08/29/2017  . Essential hypertension   . Osteoarthritis of both knees   . Gastroesophageal reflux disease without esophagitis   . Cataract   . Macular degeneration   . Allergic rhinitis   . Hypertensive heart disease without heart failure 07/18/2017  . Snoring 07/18/2017  . Prediabetes 07/05/2017  . Colon polyps 01/12/2017  . Diverticulosis of large intestine without hemorrhage 01/12/2017  . External hemorrhoids 01/12/2017  . Gastric nodule 01/12/2017  . Hiatal hernia 01/12/2017  . Internal hemorrhoids 01/12/2017  . Lipoma of colon 01/12/2017  . Multiple gastric polyps 01/12/2017  . Schatzki's ring of distal esophagus 01/12/2017  . Bloating 12/23/2016  . Dysphagia 12/23/2016  . Epigastric pain 12/23/2016  . Heartburn 12/23/2016  . History of colonic polyps 12/23/2016  . Rectal bleeding 12/23/2016  . Chronic renal impairment, stage 3 (moderate) 04/02/2016  . Age-related nuclear cataract of both eyes 10/31/2015  . Exudative age-related macular degeneration of right eye ([place]) 10/31/2015  . Nuclear sclerotic cataract of both eyes 10/31/2015  . Nonexudative age-related macular degeneration, left eye, advanced atrophic with subfoveal involvement  10/31/2015  . Bronchitis, mucopurulent recurrent ([place]) 03/31/2015  . Age-related macular degeneration, dry, left eye 02/18/2015  . Age-related macular degeneration, wet, right eye ([place]) 02/18/2015  . Morbid obesity ([place]) 05/23/2014  . Diastolic dysfunction  53/97/6734  . Intertriginous candidiasis 06/16/2013  . Mixed hyperlipidemia 10/19/2012  . PA (pernicious anemia) 10/19/2012  . Vitamin D deficiency 10/19/2012   Social History   Tobacco Use  . Smoking status: Former Smoker    Packs/day: 1.00    Years: 10.00    Pack years: 10.00    Types: Cigarettes    Quit date: 11/28/1976    Years since quitting: 43.4  . Smokeless tobacco: Never Used  Substance Use Topics  . Alcohol use: Yes    Comment: rarely, at holidays  . Drug use: No       Medications: Outpatient Medications Prior to Visit  Medication Sig  . azelastine (ASTELIN) 0.1 % nasal spray Place 2 sprays into both nostrils daily. (Patient taking differently: Place 2 sprays into both nostrils daily. As needed)  . beclomethasone (QVAR REDIHALER) 80 MCG/ACT inhaler Inhale into the lungs.  . chlorhexidine (PERIDEX) 0.12 % solution RINSE 15 ML BY MOUTH FOR 30 SECONDS THREE TIMES DAILY  . Cholecalciferol (VITAMIN D3) 5000 units CAPS Take 1 capsule by mouth daily.  . clotrimazole-betamethasone (LOTRISONE) cream APPLY EXTERNALLY TO THE AFFECTED AREA TWICE DAILY AS NEEDED  . esomeprazole (NEXIUM) 20 MG capsule Take 40 mg by mouth daily at 12 noon.   . hydrALAZINE (APRESOLINE) 25 MG tablet Take 25 mg by mouth in the morning and at bedtime.  Marland Kitchen ipratropium (ATROVENT) 0.03 % nasal spray Place 2 sprays into both nostrils every 8 (eight) hours as needed.  Marland Kitchen levocetirizine (XYZAL) 5 MG tablet SMARTSIG:1 Tablet(s) By Mouth Every Evening  . loratadine (CLARITIN) 10 MG tablet Take 1 tablet (10 mg total) by mouth daily.  . mometasone (NASONEX) 50 MCG/ACT nasal spray 2 sprays daily.  . montelukast (SINGULAIR) 10 MG tablet Take 1 tablet (10 mg total) by mouth daily.  . Multiple Vitamins-Minerals (EYE VITAMINS PO) Take by mouth daily.   . Naproxen Sodium 220 MG CAPS Take by mouth as needed.   . Omega-3 Fatty Acids (FISH OIL) 1000 MG CAPS Take 1 capsule by mouth daily.  . pramipexole (MIRAPEX) 0.5 MG  tablet Take 1 tablet (0.5 mg total) by mouth at bedtime.  Marland Kitchen PREVIDENT 5000 DRY MOUTH 1.1 % GEL dental gel Place 1 application onto teeth. 2-3xs daily  . triamcinolone (NASACORT ALLERGY 24HR) 55 MCG/ACT AERO nasal inhaler Place 2 sprays into the nose daily.  Marland Kitchen triamterene-hydrochlorothiazide (MAXZIDE-25) 37.5-25 MG tablet Take 1 tablet by mouth daily.  Marland Kitchen EPINEPHrine 0.3 mg/0.3 mL IJ SOAJ injection as needed.   . [DISCONTINUED] amLODipine (NORVASC) 5 MG tablet Take 1 tablet (5 mg total) by mouth daily. (Patient not taking: Reported on 05/05/2020)   No facility-administered medications prior to visit.    Review of Systems  Constitutional: Negative.   Respiratory: Negative.   Cardiovascular: Positive for leg swelling.  Musculoskeletal: Positive for arthralgias, gait problem and myalgias.     Objective    BP (!) 144/77 (BP Location: Right Arm, Patient Position: Sitting, Cuff Size: Large)   Pulse 83   Temp (!) 96.8 F (36 C) (Temporal)   Resp 16   Ht 5' (1.524 m)   Wt 254 lb (115.2 kg)   BMI 49.61 kg/m  BP Readings from Last 3 Encounters:  05/05/20 (!) 144/77  09/26/19 135/72  05/28/19 Marland Kitchen)  171/74   Wt Readings from Last 3 Encounters:  05/05/20 254 lb (115.2 kg)  09/26/19 253 lb (114.8 kg)  05/28/19 250 lb (113.4 kg)      Physical Exam Vitals reviewed.  Constitutional:      General: She is not in acute distress.    Appearance: Normal appearance. She is well-developed. She is not diaphoretic.  HENT:     Head: Normocephalic and atraumatic.  Eyes:     General: No scleral icterus.    Conjunctiva/sclera: Conjunctivae normal.  Neck:     Thyroid: No thyromegaly.  Cardiovascular:     Rate and Rhythm: Normal rate and regular rhythm.     Pulses: Normal pulses.     Heart sounds: Normal heart sounds. No murmur.  Pulmonary:     Effort: Pulmonary effort is normal. No respiratory distress.     Breath sounds: Normal breath sounds. No wheezing, rhonchi or rales.  Musculoskeletal:      Cervical back: Neck supple.     Right lower leg: Edema present.     Left lower leg: Edema present.  Lymphadenopathy:     Cervical: No cervical adenopathy.  Skin:    General: Skin is warm and dry.     Findings: No rash.  Neurological:     Mental Status: She is alert and oriented to person, place, and time. Mental status is at baseline.  Psychiatric:        Mood and Affect: Mood normal.        Behavior: Behavior normal.      Results for orders placed or performed in visit on 05/05/20  Comprehensive metabolic panel  Result Value Ref Range   Glucose 117 (H) 65 - 99 mg/dL   BUN 24 8 - 27 mg/dL   Creatinine, Ser 1.22 (H) 0.57 - 1.00 mg/dL   GFR calc non Af Amer 42 (L) >59 mL/min/1.73   GFR calc Af Amer 49 (L) >59 mL/min/1.73   BUN/Creatinine Ratio 20 12 - 28   Sodium 138 134 - 144 mmol/L   Potassium 4.3 3.5 - 5.2 mmol/L   Chloride 99 96 - 106 mmol/L   CO2 24 20 - 29 mmol/L   Calcium 9.8 8.7 - 10.3 mg/dL   Total Protein 6.5 6.0 - 8.5 g/dL   Albumin 4.2 3.7 - 4.7 g/dL   Globulin, Total 2.3 1.5 - 4.5 g/dL   Albumin/Globulin Ratio 1.8 1.2 - 2.2   Bilirubin Total 0.3 0.0 - 1.2 mg/dL   Alkaline Phosphatase 112 48 - 121 IU/L   AST 16 0 - 40 IU/L   ALT 9 0 - 32 IU/L  Lipid Panel With LDL/HDL Ratio  Result Value Ref Range   Cholesterol, Total 228 (H) 100 - 199 mg/dL   Triglycerides 125 0 - 149 mg/dL   HDL 81 >39 mg/dL   VLDL Cholesterol Cal 22 5 - 40 mg/dL   LDL Chol Calc (NIH) 125 (H) 0 - 99 mg/dL   LDL/HDL Ratio 1.5 0.0 - 3.2 ratio  Hemoglobin A1c  Result Value Ref Range   Hgb A1c MFr Bld 5.8 (H) 4.8 - 5.6 %   Est. average glucose Bld gHb Est-mCnc 120 mg/dL    Assessment & Plan     Problem List Items Addressed This Visit      Cardiovascular and Mediastinum   Essential hypertension - Primary    Blood pressure elevated today Knee pain may be contributing Well-controlled at home Continue Maxide at current dose Stop amlodipine given lower  extremity edema Start  hydralazine 25 mg 3 times daily instead Recheck metabolic panel      Relevant Medications   hydrALAZINE (APRESOLINE) 25 MG tablet   Other Relevant Orders   Comprehensive metabolic panel (Completed)   Senile purpura ([facility])    Continue to monitor      Relevant Medications   hydrALAZINE (APRESOLINE) 25 MG tablet     Other   Pedal edema    Chronic and uncontrolled Suspect this is multifactorial, related to venous insufficiency related to obesity, immobility with legs not elevated, and medication side effects She is no longer taking chronic NSAIDs Amlodipine may be contributing to swelling, so we will discontinue this as above Encourage compression stockings      Relevant Orders   Comprehensive metabolic panel (Completed)   Exudative age-related macular degeneration of right eye ([place])    Followed by [place] ophthalmology regularly      Mixed hyperlipidemia    Reviewed last lipid panel Not currently on a statin Recheck FLP and CMP Discussed diet and exercise       Relevant Medications   hydrALAZINE (APRESOLINE) 25 MG tablet   Other Relevant Orders   Lipid Panel With LDL/HDL Ratio (Completed)   Morbid obesity ([place])    Discussed importance of healthy weight management Discussed diet and exercise       Prediabetes    Recheck A1c      Relevant Orders   Hemoglobin A1c (Completed)   Chronic pain of right knee    Chronic right knee pain Worsening in the last several days Has failed corticosteroid injections and Synvisc Not a surgical candidate due to morbid obesity Continue current medication Start OTC Capsaicin cream Referred to pain managment      Relevant Orders   Ambulatory referral to Pain Clinic   Nausea    New problem Patient denies any vomiting Continue esomeprazole and take OTC Tums          Return in about 2 months (around 07/05/2020) for CPE, AWV.      I, Lavon Paganini, MD, have reviewed all documentation for this visit. The documentation on  05/06/20 for the exam, diagnosis, procedures, and orders are all accurate and complete.   Bonnee Zertuche, Dionne Bucy, MD, MPH [facility]

## 2020-05-05 NOTE — Patient Instructions (Addendum)
Stop Amlodipine and restart Hydralazine Use Capsaicin for knee pain And continue using Lotrisone on rash

## 2020-05-05 NOTE — Assessment & Plan Note (Addendum)
Chronic right knee pain Worsening in the last several days Has failed corticosteroid injections and Synvisc Not a surgical candidate due to morbid obesity Continue current medication Start OTC Capsaicin cream Referred to pain managment

## 2020-05-06 DIAGNOSIS — D692 Other nonthrombocytopenic purpura: Secondary | ICD-10-CM | POA: Insufficient documentation

## 2020-05-06 LAB — COMPREHENSIVE METABOLIC PANEL
ALT: 9 IU/L (ref 0–32)
AST: 16 IU/L (ref 0–40)
Albumin/Globulin Ratio: 1.8 (ref 1.2–2.2)
Albumin: 4.2 g/dL (ref 3.7–4.7)
Alkaline Phosphatase: 112 IU/L (ref 48–121)
BUN/Creatinine Ratio: 20 (ref 12–28)
BUN: 24 mg/dL (ref 8–27)
Bilirubin Total: 0.3 mg/dL (ref 0.0–1.2)
CO2: 24 mmol/L (ref 20–29)
Calcium: 9.8 mg/dL (ref 8.7–10.3)
Chloride: 99 mmol/L (ref 96–106)
Creatinine, Ser: 1.22 mg/dL — ABNORMAL HIGH (ref 0.57–1.00)
GFR calc Af Amer: 49 mL/min/{1.73_m2} — ABNORMAL LOW (ref >59)
GFR calc non Af Amer: 42 mL/min/{1.73_m2} — ABNORMAL LOW (ref >59)
Globulin, Total: 2.3 g/dL (ref 1.5–4.5)
Glucose: 117 mg/dL — ABNORMAL HIGH (ref 65–99)
Potassium: 4.3 mmol/L (ref 3.5–5.2)
Sodium: 138 mmol/L (ref 134–144)
Total Protein: 6.5 g/dL (ref 6.0–8.5)

## 2020-05-06 LAB — LIPID PANEL WITH LDL/HDL RATIO
Cholesterol, Total: 228 mg/dL — ABNORMAL HIGH (ref 100–199)
HDL: 81 mg/dL (ref >39)
LDL Chol Calc (NIH): 125 mg/dL — ABNORMAL HIGH (ref 0–99)
LDL/HDL Ratio: 1.5 ratio (ref 0.0–3.2)
Triglycerides: 125 mg/dL (ref 0–149)
VLDL Cholesterol Cal: 22 mg/dL (ref 5–40)

## 2020-05-06 LAB — HEMOGLOBIN A1C
Est. average glucose Bld gHb Est-mCnc: 120 mg/dL
Hgb A1c MFr Bld: 5.8 % — ABNORMAL HIGH (ref 4.8–5.6)

## 2020-05-06 NOTE — Assessment & Plan Note (Signed)
Followed by [facility] ophthalmology regularly

## 2020-05-06 NOTE — Assessment & Plan Note (Signed)
Chronic and uncontrolled Suspect this is multifactorial, related to venous insufficiency related to obesity, immobility with legs not elevated, and medication side effects She is no longer taking chronic NSAIDs Amlodipine may be contributing to swelling, so we will discontinue this as above Encourage compression stockings

## 2020-05-06 NOTE — Assessment & Plan Note (Signed)
Recheck A1c 

## 2020-05-06 NOTE — Assessment & Plan Note (Signed)
Continue to monitor

## 2020-05-07 ENCOUNTER — Other Ambulatory Visit: Payer: Self-pay

## 2020-05-07 NOTE — Telephone Encounter (Signed)
-----   Message from Virginia Crews, MD sent at 05/06/2020  8:57 AM EDT ----- Kidney function is decreased slightly.  Avoid NSAIDs.  Hemoglobin A1c, 3 month avg of blood sugars, is in prediabetic range.  In order to prevent progression to diabetes, recommend low carb diet and regular exercise.  Cholesterol is high and The 10-year ASCVD (heart disease and stroke) risk score Mikey Bussing DC Jr., et al., 2013) is: 39.5% - which is quite high.  Recommend statin medication to decrease this risk.  Know she has had myalgias with simvastatin, but could consider a different statin with less risk of myalgias like Crestor.

## 2020-05-07 NOTE — Telephone Encounter (Signed)
Patient advised as below. Patient verbalizes understanding and is in agreement with treatment plan.  

## 2020-05-08 MED ORDER — ROSUVASTATIN CALCIUM 5 MG PO TABS: 5.0000 mg | ORAL_TABLET | Freq: Every day | ORAL | 3 refills | Status: DC

## 2020-06-10 ENCOUNTER — Ambulatory Visit: Payer: Medicare Other | Admitting: Family Medicine

## 2020-06-16 ENCOUNTER — Other Ambulatory Visit: Payer: Self-pay | Admitting: Family Medicine

## 2020-06-16 NOTE — Telephone Encounter (Signed)
Requested Prescriptions  Pending Prescriptions Disp Refills  . montelukast (SINGULAIR) 10 MG tablet [Pharmacy Med Name: MONTELUKAST 10MG  TABLETS] 90 tablet 3    Sig: TAKE 1 TABLET(10 MG) BY MOUTH DAILY     Pulmonology:  Leukotriene Inhibitors Passed - 06/16/2020 10:22 PM      Passed - Valid encounter within last 12 months    Recent Outpatient Visits          1 month ago Essential hypertension   [facility] [place], Dionne Bucy, MD   8 months ago Pedal edema   [facility] [place], Dionne Bucy, MD   1 year ago Essential hypertension   [place], Clearnce Sorrel, [place]   1 year ago Essential hypertension   [facility] [place], Dionne Bucy, MD   1 year ago Primary osteoarthritis of both [place], Dionne Bucy, MD      Future Appointments            In 3 weeks [name], Dionne Bucy, MD [facility], [place]

## 2020-06-19 ENCOUNTER — Other Ambulatory Visit: Payer: Self-pay | Admitting: Family Medicine

## 2020-06-19 DIAGNOSIS — I10 Essential (primary) hypertension: Secondary | ICD-10-CM

## 2020-06-23 ENCOUNTER — Telehealth: Payer: Self-pay

## 2020-06-23 DIAGNOSIS — E7800 Pure hypercholesterolemia, unspecified: Secondary | ICD-10-CM

## 2020-06-23 MED ORDER — EZETIMIBE 10 MG PO TABS: 10.0000 mg | ORAL_TABLET | Freq: Every day | ORAL | 3 refills | Status: DC

## 2020-06-23 NOTE — Telephone Encounter (Signed)
Per patient statin cause severe leg cramping and she doesn't want to take a statin and that she had told this to her doctor and was not aware that Crestor was a stating.

## 2020-06-23 NOTE — Addendum Note (Signed)
Addended by: Mar Daring on: 06/23/2020 07:15 PM   Modules accepted: Orders

## 2020-06-23 NOTE — Telephone Encounter (Signed)
Is she having side effects?  There are alternatives if she is not tolerating the medication but they are not as good at lowering cardiovascular risk.

## 2020-06-23 NOTE — Telephone Encounter (Signed)
Please review

## 2020-06-23 NOTE — Telephone Encounter (Signed)
Called and talked with patient and confirmed tomorrows appt. Patient has filled out her history and physical , and will bring it tomorrow with a list of her medications.

## 2020-06-23 NOTE — Telephone Encounter (Signed)
Zetia sent to [employer]

## 2020-06-23 NOTE — Telephone Encounter (Signed)
[place]

## 2020-06-23 NOTE — Telephone Encounter (Signed)
Copied from [place] [id number]. Topic: General - Other >> Jun 23, 2020  1:25 PM Rainey Pines A wrote: Patient would like a callback from Dr. Sharmaine Base nurse in regards to the rosuvastatin (CRESTOR) 5 MG tablet  that she was prescribed. Patient wants to know if an alternative can be called in for this medication that is not a statin. Please advise

## 2020-06-24 ENCOUNTER — Other Ambulatory Visit: Payer: Self-pay

## 2020-06-24 ENCOUNTER — Ambulatory Visit
Payer: Medicare Other | Attending: Student in an Organized Health Care Education/Training Program | Admitting: Student in an Organized Health Care Education/Training Program

## 2020-06-24 ENCOUNTER — Encounter: Payer: Self-pay | Admitting: Student in an Organized Health Care Education/Training Program

## 2020-06-24 VITALS — BP 179/77 | HR 84 | Temp 97.0°F | Resp 20 | Ht 61.0 in | Wt 248.0 lb

## 2020-06-24 DIAGNOSIS — M25561 Pain in right knee: Secondary | ICD-10-CM

## 2020-06-24 DIAGNOSIS — G894 Chronic pain syndrome: Secondary | ICD-10-CM | POA: Insufficient documentation

## 2020-06-24 DIAGNOSIS — M1711 Unilateral primary osteoarthritis, right knee: Secondary | ICD-10-CM | POA: Insufficient documentation

## 2020-06-24 DIAGNOSIS — G8929 Other chronic pain: Secondary | ICD-10-CM | POA: Insufficient documentation

## 2020-06-24 DIAGNOSIS — M2351 Chronic instability of knee, right knee: Secondary | ICD-10-CM | POA: Insufficient documentation

## 2020-06-24 MED ORDER — DICLOFENAC SODIUM 75 MG PO TBEC: 75.0000 mg | DELAYED_RELEASE_TABLET | Freq: Two times a day (BID) | ORAL | 0 refills | Status: AC

## 2020-06-24 NOTE — Progress Notes (Signed)
Patient: Gloria Mercer  Service Category: E/M  Provider: Gillis Santa, MD  DOB: 08-Dec-1940  DOS: 06/24/2020  Referring Provider: Virginia Crews, MD  MRN: [id number]  Setting: Ambulatory outpatient  PCP: Virginia Crews, MD  Type: New Patient  Specialty: Interventional Pain Management    Location: Office  Delivery: Face-to-face     Primary Reason(s) for Visit: Encounter for initial evaluation of one or more chronic problems (new to examiner) potentially causing chronic pain, and posing a threat to normal musculoskeletal function. (Level of risk: High) CC: Pain (bilateral knees.)  HPI  Gloria Mercer is a 79 y.o. year old, female patient, who comes today to see [place] for the first time for an initial evaluation of her chronic pain. She has Essential hypertension; Primary osteoarthritis of right knee; Gastroesophageal reflux disease without esophagitis; Cataract; Macular degeneration; Allergic rhinitis; RLS (restless legs syndrome); Pedal edema; Age-related macular degeneration, dry, left eye; Age-related macular degeneration, wet, right eye ([place]); Bloating; Age-related nuclear cataract of both eyes; Bronchitis, mucopurulent recurrent ([place]); Chronic renal impairment, stage 3 (moderate); Colon polyps; Diastolic dysfunction; Diverticulosis of large intestine without hemorrhage; Dysphagia; Epigastric pain; External hemorrhoids; Exudative age-related macular degeneration of right eye ([place]); Gastric nodule; Heartburn; Hiatal hernia; History of colonic polyps; Hypertensive heart disease without heart failure; Internal hemorrhoids; Intertriginous candidiasis; Lipoma of colon; Mixed hyperlipidemia; Morbid obesity ([place]); Multiple gastric polyps; Nuclear sclerotic cataract of both eyes; Nonexudative age-related macular degeneration, left eye, advanced atrophic with subfoveal involvement; PA (pernicious anemia); Prediabetes; Rectal bleeding; Schatzki's ring of distal esophagus; Snoring; Vitamin D deficiency;  Constipation; Chronic back pain; Chronic pain of right knee; Nausea; Senile purpura ([place]); and Chronic instability of right knee on their problem list. Today she comes in for evaluation of her Pain (bilateral knees.)  Pain Assessment: Location: Right, Left Knee Radiating: down the legs at times. Onset: More than a month ago Duration: Chronic pain Quality: Throbbing, Constant, Sharp Severity: 7 /10 (subjective, self-reported pain score)  Note: Reported level is compatible with observation. Effect on ADL: Can't drive anymore. Timing: Constant Modifying factors: cold packs or heat, warm showers BP: (!) 179/77  HR: 84  Onset and Duration: Sudden and Present less than 3 months Cause of pain: Arthritis Severity: Getting better, NAS-11 at its worse: 10/10, NAS-11 at its best: 6/10, NAS-11 now: 7/10 and NAS-11 on the average: 7/10 Timing: After activity or exercise Aggravating Factors: Bending, Climbing, Kneeling, Lifiting, Prolonged sitting, Prolonged standing, Squatting, Twisting, Walking, Walking uphill, Walking downhill and Working Alleviating Factors: Cold packs, Hot packs, Lying down, Medications, Resting and Warm showers or baths Associated Problems: Depression, Dizziness, Fatigue, Nausea, Sadness and Swelling Quality of Pain: Aching, Deep, Sharp, Shooting, Sickening, Splitting, Stabbing, Tender, Throbbing and Uncomfortable Previous Examinations or Tests: CT scan, X-rays and Orthopedic evaluation Previous Treatments: Narcotic medications and Stretching exercises  The patient comes into the clinics today for the first time for a chronic pain management evaluation.  Gloria Mercer is a very pleasant 79 year old female who presents with a chief complaint of bilateral knee pain, right greater than left due to primary knee osteoarthritis.  She has tried and failed intra-articular steroid injections as well as intra-articular viscosupplementation.  She does utilize heat to that knee at times.  She has  tried various NSAIDs in the past as well as creams to her knees which have provided limited analgesic benefit.  She is being referred here from Dr. Brita Romp to consider other options for her persistent knee pain related to knee arthropathy.  Of note she  has performed stretching exercises for her knee in the past and continues to do so.  Meds   Current Outpatient Medications:  .  azelastine (ASTELIN) 0.1 % nasal spray, Place 2 sprays into both nostrils daily. (Patient taking differently: Place 2 sprays into both nostrils daily. As needed), Disp: 30 mL, Rfl: 11 .  beclomethasone (QVAR REDIHALER) 80 MCG/ACT inhaler, Inhale into the lungs., Disp: , Rfl:  .  chlorhexidine (PERIDEX) 0.12 % solution, RINSE 15 ML BY MOUTH FOR 30 SECONDS THREE TIMES DAILY, Disp: , Rfl:  .  Cholecalciferol (VITAMIN D3) 5000 units CAPS, Take 1 capsule by mouth daily., Disp: , Rfl:  .  clotrimazole-betamethasone (LOTRISONE) cream, APPLY EXTERNALLY TO THE AFFECTED AREA TWICE DAILY AS NEEDED, Disp: 60 g, Rfl: 1 .  EPINEPHrine 0.3 mg/0.3 mL IJ SOAJ injection, as needed. , Disp: , Rfl:  .  esomeprazole (NEXIUM) 20 MG capsule, Take 40 mg by mouth daily at 12 noon. , Disp: , Rfl:  .  ezetimibe (ZETIA) 10 MG tablet, Take 1 tablet (10 mg total) by mouth daily., Disp: 90 tablet, Rfl: 3 .  hydrALAZINE (APRESOLINE) 25 MG tablet, Take 25 mg by mouth in the morning and at bedtime., Disp: , Rfl:  .  ipratropium (ATROVENT) 0.03 % nasal spray, Place 2 sprays into both nostrils every 8 (eight) hours as needed., Disp: 30 mL, Rfl: 0 .  montelukast (SINGULAIR) 10 MG tablet, TAKE 1 TABLET(10 MG) BY MOUTH DAILY, Disp: 90 tablet, Rfl: 3 .  Multiple Vitamins-Minerals (EYE VITAMINS PO), Take by mouth daily. , Disp: , Rfl:  .  Omega-3 Fatty Acids (FISH OIL) 1000 MG CAPS, Take 1 capsule by mouth daily., Disp: , Rfl:  .  pramipexole (MIRAPEX) 0.5 MG tablet, Take 1 tablet (0.5 mg total) by mouth at bedtime., Disp: 90 tablet, Rfl: 3 .  PREVIDENT 5000  DRY MOUTH 1.1 % GEL dental gel, Place 1 application onto teeth. 2-3xs daily, Disp: , Rfl:  .  triamcinolone (NASACORT ALLERGY 24HR) 55 MCG/ACT AERO nasal inhaler, Place 2 sprays into the nose daily., Disp: , Rfl:  .  triamterene-hydrochlorothiazide (MAXZIDE-25) 37.5-25 MG tablet, TAKE 1 TABLET BY MOUTH DAILY, Disp: 90 tablet, Rfl: 1 .  diclofenac (VOLTAREN) 75 MG EC tablet, Take 1 tablet (75 mg total) by mouth 2 (two) times daily for 21 days., Disp: 42 tablet, Rfl: 0  Imaging Review    Lumbosacral Imaging: Lumbar MR wo contrast: Results for orders placed during the hospital encounter of 07/08/18  MR LUMBAR SPINE WO CONTRAST  Narrative CLINICAL DATA:  Lumbar radiculopathy, risk factors (osteoporosis or chronic steroid use or elderly.) Bilateral leg pain for several weeks with sudden increased today. Pain is now greater than 10/10.  EXAM: MRI LUMBAR SPINE WITHOUT CONTRAST  TECHNIQUE: Multiplanar, multisequence MR imaging of the lumbar spine was performed. No intravenous contrast was administered.  COMPARISON:  None.  FINDINGS: Segmentation: 5 non rib-bearing lumbar type vertebral bodies are present. Marrow signal and vertebral body heights are normal.  Alignment: Slight retrolisthesis at L2-3 and L3-4. Trace anterolisthesis is present at L4-5. Grade 1 anterolisthesis is present at L5-S1. Leftward curvature is centered at L4.  Vertebrae:  Marrow signal and vertebral body heights are normal.  Conus medullaris and cauda equina: Conus extends to the L1 level. Conus and cauda equina appear normal.  Paraspinal and other soft tissues: Limited imaging of the abdomen is unremarkable. Paraspinous soft tissues are within normal limits.  Disc levels:  T12-L1: Negative.  L1-2: Negative.  L2-3: A  left paramedian disc protrusion is present. Mild left subarticular narrowing is present. The foramina are patent.  L3-4: A broad-based disc protrusion is present. Disc extends into both  neural foramina. Central canal is patent. Mild right foraminal narrowing is present.  L4-5: A broad-based disc protrusion is present. Moderate facet hypertrophy is noted bilaterally. A left synovial cyst contributes to mild left subarticular narrowing. Mild foraminal narrowing bilaterally is worse on the left.  L5-S1: Moderate facet hypertrophy is present bilaterally. There is uncovering of a broad-based disc protrusion. The central canal and foramina patent.  IMPRESSION: 1. Multilevel spondylosis of the lumbar spine with mild leftward curvature at L4-5. 2. Mild left subarticular narrowing at L2-3. 3. Mild right foraminal stenosis at L3-4. 4. Mild left subarticular and left greater than right foraminal narrowing at L4-5. 5. Grade 1 anterolisthesis at L5-S1 without significant stenosis.   Electronically Signed By: San Morelle M.D. On: 07/08/2018 18:31   Complexity Note: Imaging results reviewed. Results shared with Gloria Mercer, using Layman's terms.                         ROS  Cardiovascular: Daily Aspirin intake and High blood pressure Pulmonary or Respiratory: Shortness of breath Neurological: No reported neurological signs or symptoms such as seizures, abnormal skin sensations, urinary and/or fecal incontinence, being born with an abnormal open spine and/or a tethered spinal cord Psychological-Psychiatric: Depressed and Difficulty sleeping and or falling asleep Gastrointestinal: Heartburn due to stomach pushing into lungs (Hiatal hernia) and Reflux or heatburn Genitourinary: Kidney disease and Passing kidney stones Hematological: Brusing easily Endocrine: No reported endocrine signs or symptoms such as high or low blood sugar, rapid heart rate due to high thyroid levels, obesity or weight gain due to slow thyroid or thyroid disease Rheumatologic: No reported rheumatological signs and symptoms such as fatigue, joint pain, tenderness, swelling, redness, heat,  stiffness, decreased range of motion, with or without associated rash Musculoskeletal: Negative for myasthenia gravis, muscular dystrophy, multiple sclerosis or malignant hyperthermia Work History: Retired  Allergies  Gloria Mercer is allergic to erythromycin, losartan, metoprolol, simvastatin, dexlansoprazole, lisinopril, and prednisone.  Laboratory Chemistry Profile   Renal Lab Results  Component Value Date   BUN 24 05/05/2020   CREATININE 1.22 (H) 05/05/2020   BCR 20 05/05/2020   GFRAA 49 (L) 05/05/2020   GFRNONAA 42 (L) 05/05/2020   PROTEINUR 100 (A) 07/08/2018     Electrolytes Lab Results  Component Value Date   NA 138 05/05/2020   K 4.3 05/05/2020   CL 99 05/05/2020   CALCIUM 9.8 05/05/2020     Hepatic Lab Results  Component Value Date   AST 16 05/05/2020   ALT 9 05/05/2020   ALBUMIN 4.2 05/05/2020   ALKPHOS 112 05/05/2020     ID No results found for: LYMEIGGIGMAB, HIV, SARSCOV2NAA, STAPHAUREUS, MRSAPCR, HCVAB, PREGTESTUR, RMSFIGG, QFVRPH1IGG, QFVRPH2IGG, [facility]   Bone Lab Results  Component Value Date   VD25OH 45.4 12/25/2018     Endocrine Lab Results  Component Value Date   GLUCOSE 117 (H) 05/05/2020   GLUCOSEU 50 (A) 07/08/2018   HGBA1C 5.8 (H) 05/05/2020   TSH 2.960 12/25/2018     Neuropathy Lab Results  Component Value Date   VITAMINB12 300 12/25/2018   HGBA1C 5.8 (H) 05/05/2020     CNS No results found for: COLORCSF, APPEARCSF, RBCCOUNTCSF, WBCCSF, POLYSCSF, LYMPHSCSF, EOSCSF, PROTEINCSF, GLUCCSF, JCVIRUS, CSFOLI, IGGCSF, LABACHR, ACETBL, LABACHR, ACETBL   Inflammation (CRP: Acute  ESR: Chronic) No  results found for: CRP, ESRSEDRATE, LATICACIDVEN   Rheumatology No results found for: RF, ANA, LABURIC, URICUR, LYMEIGGIGMAB, LYMEABIGMQN, HLAB27   Coagulation Lab Results  Component Value Date   PLT 270 12/25/2018     Cardiovascular Lab Results  Component Value Date   BNP 65.8 05/28/2019   TROPONINI <0.03 07/08/2018   HGB 12.7  12/25/2018   HCT 37.7 12/25/2018     Screening No results found for: SARSCOV2NAA, COVIDSOURCE, STAPHAUREUS, MRSAPCR, HCVAB, HIV, PREGTESTUR   Cancer No results found for: CEA, CA125, LABCA2   Allergens No results found for: ALMOND, APPLE, ASPARAGUS, AVOCADO, BANANA, BARLEY, BASIL, BAYLEAF, GREENBEAN, LIMABEAN, WHITEBEAN, BEEFIGE, REDBEET, BLUEBERRY, BROCCOLI, CABBAGE, MELON, CARROT, CASEIN, CASHEWNUT, CAULIFLOWER, CELERY     Note: Lab results reviewed.   PFSH  Drug: Gloria Mercer  reports no history of drug use. Alcohol:  reports current alcohol use. Tobacco:  reports that she quit smoking about 43 years ago. Her smoking use included cigarettes. She has a 10.00 pack-year smoking history. She has never used smokeless tobacco. Medical:  has a past medical history of Allergic rhinitis, Cataract, GERD (gastroesophageal reflux disease), Hypertension, Macular degeneration, Osteoarthritis, and Sciatica. Family: family history includes Diabetes in her father; Heart disease (age of onset: 27) in her father; Hypertension in her mother; Myelodysplastic syndrome in her sister; Prostate cancer in her paternal grandfather; Seizures in her mother.  Past Surgical History:  Procedure Laterality Date  . CHOLECYSTECTOMY  2002  . TUBAL LIGATION  1978   Active Ambulatory Problems    Diagnosis Date Noted  . Essential hypertension   . Primary osteoarthritis of right knee   . Gastroesophageal reflux disease without esophagitis   . Cataract   . Macular degeneration   . Allergic rhinitis   . RLS (restless legs syndrome) 08/29/2017  . Pedal edema 08/29/2017  . Age-related macular degeneration, dry, left eye 02/18/2015  . Age-related macular degeneration, wet, right eye ([place]) 02/18/2015  . Bloating 12/23/2016  . Age-related nuclear cataract of both eyes 10/31/2015  . Bronchitis, mucopurulent recurrent ([place]) 03/31/2015  . Chronic renal impairment, stage 3 (moderate) 04/02/2016  . Colon polyps 01/12/2017   . Diastolic dysfunction 17/51/0258  . Diverticulosis of large intestine without hemorrhage 01/12/2017  . Dysphagia 12/23/2016  . Epigastric pain 12/23/2016  . External hemorrhoids 01/12/2017  . Exudative age-related macular degeneration of right eye ([place]) 10/31/2015  . Gastric nodule 01/12/2017  . Heartburn 12/23/2016  . Hiatal hernia 01/12/2017  . History of colonic polyps 12/23/2016  . Hypertensive heart disease without heart failure 07/18/2017  . Internal hemorrhoids 01/12/2017  . Intertriginous candidiasis 06/16/2013  . Lipoma of colon 01/12/2017  . Mixed hyperlipidemia 10/19/2012  . Morbid obesity ([place]) 05/23/2014  . Multiple gastric polyps 01/12/2017  . Nuclear sclerotic cataract of both eyes 10/31/2015  . Nonexudative age-related macular degeneration, left eye, advanced atrophic with subfoveal involvement 10/31/2015  . PA (pernicious anemia) 10/19/2012  . Prediabetes 07/05/2017  . Rectal bleeding 12/23/2016  . Schatzki's ring of distal esophagus 01/12/2017  . Snoring 07/18/2017  . Vitamin D deficiency 10/19/2012  . Constipation 01/12/2018  . Chronic back pain 07/09/2018  . Chronic pain of right knee 05/05/2020  . Nausea 05/05/2020  . Senile purpura ([place]) 05/06/2020  . Chronic instability of right knee 06/24/2020   Resolved Ambulatory Problems    Diagnosis Date Noted  . Elevated hemoglobin A1c 04/02/2016   Past Medical History:  Diagnosis Date  . GERD (gastroesophageal reflux disease)   . Hypertension   . Osteoarthritis   .  Sciatica    Constitutional Exam  General appearance: Well nourished, well developed, and well hydrated. In no apparent acute distress Vitals:   06/24/20 1241  BP: (!) 179/77  Pulse: 84  Resp: 20  Temp: (!) 97 F (36.1 C)  TempSrc: Temporal  SpO2: 98%  Weight: (!) 248 lb (112.5 kg)  Height: _0  (1.549 m)   BMI Assessment: Estimated body mass index is 46.86 kg/m as calculated from the following:   Height as of this encounter: 5'  1" (1.549 m).   Weight as of this encounter: 248 lb (112.5 kg).  BMI interpretation table: BMI level Category Range association with higher incidence of chronic pain  <18 kg/m2 Underweight   18.5-24.9 kg/m2 Ideal body weight   25-29.9 kg/m2 Overweight Increased incidence by 20%  30-34.9 kg/m2 Obese (Class I) Increased incidence by 68%  35-39.9 kg/m2 Severe obesity (Class II) Increased incidence by 136%  >40 kg/m2 Extreme obesity (Class III) Increased incidence by 254%   Patient's current BMI Ideal Body weight  Body mass index is 46.86 kg/m. Ideal body weight: 47.8 kg (105 lb 6.1 oz) Adjusted ideal body weight: 73.7 kg (162 lb 6.8 oz)   BMI Readings from Last 4 Encounters:  06/24/20 46.86 kg/m  05/05/20 49.61 kg/m  09/26/19 47.80 kg/m  05/28/19 47.24 kg/m   Wt Readings from Last 4 Encounters:  06/24/20 (!) 248 lb (112.5 kg)  05/05/20 254 lb (115.2 kg)  09/26/19 253 lb (114.8 kg)  05/28/19 250 lb (113.4 kg)    Psych/Mental status: Alert, oriented x 3 (person, place, & time)       Eyes: PERLA Respiratory: No evidence of acute respiratory distress  Cervical Spine Exam  Skin & Axial Inspection: No masses, redness, edema, swelling, or associated skin lesions Alignment: Symmetrical Functional ROM: Unrestricted ROM      Stability: No instability detected Muscle Tone/Strength: Functionally intact. No obvious neuro-muscular anomalies detected. Sensory (Neurological): Unimpaired Palpation: No palpable anomalies              Upper Extremity (UE) Exam    Side: Right upper extremity  Side: Left upper extremity  Skin & Extremity Inspection: Skin color, temperature, and hair growth are WNL. No peripheral edema or cyanosis. No masses, redness, swelling, asymmetry, or associated skin lesions. No contractures.  Skin & Extremity Inspection: Skin color, temperature, and hair growth are WNL. No peripheral edema or cyanosis. No masses, redness, swelling, asymmetry, or associated skin  lesions. No contractures.  Functional ROM: Unrestricted ROM          Functional ROM: Unrestricted ROM          Muscle Tone/Strength: Functionally intact. No obvious neuro-muscular anomalies detected.   Muscle Tone/Strength: Functionally intact. No obvious neuro-muscular anomalies detected.  Sensory (Neurological): Unimpaired          Sensory (Neurological): Unimpaired          Palpation: No palpable anomalies              Palpation: No palpable anomalies              Provocative Test(s):  Phalen's test: deferred Tinel's test: deferred Apley's scratch test (touch opposite shoulder):  Action 1 (Across chest): deferred Action 2 (Overhead): deferred Action 3 (LB reach): deferred   Provocative Test(s):  Phalen's test: deferred Tinel's test: deferred Apley's scratch test (touch opposite shoulder):  Action 1 (Across chest): deferred Action 2 (Overhead): deferred Action 3 (LB reach): deferred    Thoracic Spine Area Exam  Skin & Axial Inspection: No masses, redness, or swelling Alignment: Symmetrical Functional ROM: Unrestricted ROM Stability: No instability detected Muscle Tone/Strength: Functionally intact. No obvious neuro-muscular anomalies detected. Sensory (Neurological): Unimpaired Muscle strength & Tone: No palpable anomalies  Lumbar Exam  Skin & Axial Inspection: No masses, redness, or swelling Alignment: Symmetrical Functional ROM: Decreased ROM       Stability: No instability detected Muscle Tone/Strength: Functionally intact. No obvious neuro-muscular anomalies detected. Sensory (Neurological): Musculoskeletal pain pattern Provocative Tests: Hyperextension/rotation test: (+) bilaterally for facet joint pain. Lumbar quadrant test (Kemp's test): deferred today       Lateral bending test: deferred today       Patrick's Maneuver: deferred today                   FABER* test: deferred today                   S-I anterior distraction/compression test: deferred today          S-I lateral compression test: deferred today         S-I Thigh-thrust test: deferred today         S-I Gaenslen's test: deferred today         *(Flexion, ABduction and External Rotation)  Gait & Posture Assessment  Ambulation: Patient ambulates using a walker Gait: Limited. Using assistive device to ambulate Posture: Difficulty standing up straight, due to pain   Lower Extremity Exam    Side: Right lower extremity  Side: Left lower extremity  Stability: No instability observed          Stability: No instability observed          Skin & Extremity Inspection: Skin color, temperature, and hair growth are WNL. No peripheral edema or cyanosis. No masses, redness, swelling, asymmetry, or associated skin lesions. No contractures.  Skin & Extremity Inspection: Skin color, temperature, and hair growth are WNL. No peripheral edema or cyanosis. No masses, redness, swelling, asymmetry, or associated skin lesions. No contractures.  Functional ROM: Pain restricted ROM for knee joint          Functional ROM: Pain restricted ROM for knee joint          Muscle Tone/Strength: Functionally intact. No obvious neuro-muscular anomalies detected.  Muscle Tone/Strength: Functionally intact. No obvious neuro-muscular anomalies detected.  Sensory (Neurological): Arthropathic arthralgia        Sensory (Neurological): Arthropathic arthralgia        DTR: Patellar: deferred today Achilles: deferred today Plantar: deferred today  DTR: Patellar: deferred today Achilles: deferred today Plantar: deferred today  Palpation: No palpable anomalies  Palpation: No palpable anomalies   Assessment  Primary Diagnosis & Pertinent Problem List: The primary encounter diagnosis was Primary osteoarthritis of right knee. Diagnoses of Chronic pain of right knee, Chronic instability of right knee, and Chronic pain syndrome were also pertinent to this visit.  Visit Diagnosis (New problems to examiner): 1. Primary osteoarthritis of  right knee   2. Chronic pain of right knee   3. Chronic instability of right knee   4. Chronic pain syndrome    Plan of Care (Initial workup plan)  79 year old female with bilateral knee osteoarthritis, right greater than left who has failed conservative therapy including NSAIDs, heat ice, physical therapy, intra-articular steroid injection, intra-articular viscosupplementation.  Discussed right knee genicular nerve block and possible genicular nerve radiofrequency ablation for her persistent right knee pain.  Risks and benefits of this procedure were reviewed with patient in  great detail and patient would like to proceed.  Also recommend short trial of diclofenac as below.  Patient instructed to refrain from other NSAIDs while she is on diclofenac.   Procedure Orders     GENICULAR NERVE BLOCK Pharmacotherapy (current): Medications ordered:  Meds ordered this encounter  Medications  . diclofenac (VOLTAREN) 75 MG EC tablet    Sig: Take 1 tablet (75 mg total) by mouth 2 (two) times daily for 21 days.    Dispense:  42 tablet    Refill:  0   Medications administered during this visit: [name]. Mercer "Deanna" had no medications administered during this visit.    Interventional management options: Gloria Mercer was informed that there is no guarantee that she would be a candidate for interventional therapies. The decision will be based on the results of diagnostic studies, as well as Gloria Mercer's risk profile.  Procedure(s) under consideration:  Genicular nerve block Genicular nerve radiofrequency ablation   Provider-requested follow-up: Return in about 1 week (around 07/01/2020) for Right GNB w PO valium.  Future Appointments  Date Time Provider [place]  07/03/2020  2:40 PM [facility] HEALTH ADVISOR [facility]  07/07/2020 11:45 AM Gillis Santa, MD [facility] None  07/07/2020  3:00 PM [name], Dionne Bucy, MD [facility]    Note by: Gillis Santa, MD Date: 06/24/2020; Time: 2:53  PM

## 2020-06-24 NOTE — Patient Instructions (Addendum)
____________________________________________________________________________________________  Preparing for Procedure with Sedation  Procedure appointments are limited to planned procedures: . No Prescription Refills. . No disability issues will be discussed. . No medication changes will be discussed.  Instructions: . Oral Intake: Do not eat or drink anything for at least 8 hours prior to your procedure. (Exception: Blood Pressure Medication. See below.) . Transportation: Unless otherwise stated by your physician, you may drive yourself after the procedure. . Blood Pressure Medicine: Do not forget to take your blood pressure medicine with a sip of water the morning of the procedure. If your Diastolic (lower reading)is above 100 mmHg, elective cases will be cancelled/rescheduled. . Blood thinners: These will need to be stopped for procedures. Notify our staff if you are taking any blood thinners. Depending on which one you take, there will be specific instructions on how and when to stop it. . Diabetics on insulin: Notify the staff so that you can be scheduled 1st case in the morning. If your diabetes requires high dose insulin, take only  of your normal insulin dose the morning of the procedure and notify the staff that you have done so. . Preventing infections: Shower with an antibacterial soap the morning of your procedure. . Build-up your immune system: Take 1000 mg of Vitamin C with every meal (3 times a day) the day prior to your procedure. . Antibiotics: Inform the staff if you have a condition or reason that requires you to take antibiotics before dental procedures. . Pregnancy: If you are pregnant, call and cancel the procedure. . Sickness: If you have a cold, fever, or any active infections, call and cancel the procedure. . Arrival: You must be in the facility at least 30 minutes prior to your scheduled procedure. . Children: Do not bring children with you. . Dress appropriately:  Bring dark clothing that you would not mind if they get stained. . Valuables: Do not bring any jewelry or valuables.  Reasons to call and reschedule or cancel your procedure: (Following these recommendations will minimize the risk of a serious complication.) . Surgeries: Avoid having procedures within 2 weeks of any surgery. (Avoid for 2 weeks before or after any surgery). . Flu Shots: Avoid having procedures within 2 weeks of a flu shots or . (Avoid for 2 weeks before or after immunizations). . Barium: Avoid having a procedure within 7-10 days after having had a radiological study involving the use of radiological contrast. (Myelograms, Barium swallow or enema study). . Heart attacks: Avoid any elective procedures or surgeries for the initial 6 months after a "Myocardial Infarction" (Heart Attack). . Blood thinners: It is imperative that you stop these medications before procedures. Let us know if you if you take any blood thinner.  . Infection: Avoid procedures during or within two weeks of an infection (including chest colds or gastrointestinal problems). Symptoms associated with infections include: Localized redness, fever, chills, night sweats or profuse sweating, burning sensation when voiding, cough, congestion, stuffiness, runny nose, sore throat, diarrhea, nausea, vomiting, cold or Flu symptoms, recent or current infections. It is specially important if the infection is over the area that we intend to treat. . Heart and lung problems: Symptoms that may suggest an active cardiopulmonary problem include: cough, chest pain, breathing difficulties or shortness of breath, dizziness, ankle swelling, uncontrolled high or unusually low blood pressure, and/or palpitations. If you are experiencing any of these symptoms, cancel your procedure and contact your primary care physician for an evaluation.  Remember:  Regular Business hours are:    Monday to Thursday 8:00 AM to 4:00 PM  Provider's  Schedule: Milinda Pointer, MD:  Procedure days: Tuesday and Thursday 7:30 AM to 4:00 PM  Gillis Santa, MD:  Procedure days: Monday and Wednesday 7:30 AM to 4:00 PM ____________________________________________________________________________________________   ____________________________________________________________________________________________  Genicular Nerve Block  What is a genicular nerve block? A genicular nerve block is the injection of a local anesthetic to block the nerves that transmits pain from the knee.  What is the purpose of a facet nerve block? A genicular nerve block is a diagnostic procedure to determine if the pathologic changes (i.e. arthritis, meniscal tears, etc) and inflammation within the knee joint is the source of your knee pain. It also confirms that the knee pain will respond well to the actual treatment procedure. If a genicular nerve block works, it will give you relief for several hours. After that, the pain is expected to return to normal. This test is always performed twice (usually a week or two apart) because two successful tests are required to move onto treatment. If both diagnostic tests are positive, then we schedule a treatment called radiofrequency (RF) ablation. In this procedure, the same nerves are cauterized, which typically leads to pain relief for 4 -18 months. If this process works well for one knee, it can be performed on the other knee if needed.  How is the procedure performed? You will be placed on the procedure table. The injection site is sterilized with either iodine or chlorhexadine. The site to be injected is numbed with a local anesthetic, and a needle is directed to the target area. X-ray guidance is used to ensure proper placement and positioning of the needle. When the needle is properly positioned near the genicular nerve, local anesthetic is injected to numb that nerve. This will be repeated at multiple sites around the knee to  block all genicular nerves.  Will the procedure be painful? The injection can be painful and we therefore provide the option of receiving IV sedation. IV sedation, combined with local anesthetic, can make the injection nearly pain free. It allows you to remain very still during the procedure, which can also make the injection easier, faster, and more successful. If you decide to have IV sedation, you must have a driver to get you home safely afterwards. In addition, you cannot have anything to eat or drink within 8 hours of your appointment (clear liquids are allowed until 3 hours before the procedure). If you take medications for diabetes, these medications may need to be adjusted the morning of the procedure. Your primary care physician can help you with this adjustment.  What are the discharge instructions? If you received IV sedation do not drive or operate machinery for at least 24 hours after the procedure. You may return to work the next day following your procedure. You may resume your normal diet immediately. Do not engage in any strenuous activity for 24 hours. You should, however, engage in moderate activity that typically causes your ususal pain. If the block works, those activities should not be painful for several hours after the injection. Do not take a bath, swim, or use a hot tub for 24 hours (you may take a shower). Call the office if you have any of the following: severe pain afterwards (different than your usual symptoms), redness/swelling/discharge at the injection site(s), fevers/chills, difficulty with bowel or bladder functions.  What are the risks and side effects? The complication rate for this procedure is very low. Whenever a needle  enters the skin, bleeding or infection can occur. Some other serious but extremely rare risks include paralysis and death. You may have an allergic reaction to any of the medications used. If you have a known allergy to any medications, especially  local anesthetics, notify our staff before the procedure takes place. You may experience any of the following side effects up to 4 - 6 hours after the procedure: . Leg muscle weakness or numbness may occur due to the local anesthetic affecting the nerves that control your legs (this is a temporary affect and it is not paralysis). If you have any leg weakness or numbness, walk only with assistance in order to prevent falls and injury. Your leg strength will return slowly and completely. . Dizziness may occur due to a decrease in your blood pressure. If this occurs, remain in a seated or lying position. Gradually sit up, and then stand after at least 10 minutes of sitting. . Mild headaches may occur. Drink fluids and take pain medications if needed. If the headaches persist or become severe, call the office. . Mild discomfort at the injection site can occur. This typically lasts for a few hours but can persist for a couple days. If this occurs, take anti-inflammatories or pain medications, apply ice to the area the day of the procedure. If it persists, apply moist heat in the day(s) following.  The side effects listed above can be normal. They are not dangerous and will resolve on their own. If, however, you experience any of the following, a complication may have occurred and you should either contact your doctor. If he is not readily available, then you should proceed to the closest urgent care center for evaluation: . Severe or progressive pain at the injection site(s) . Arm or leg weakness that progressively worsens or persists for longer than 8 hours . Severe or progressive redness, swelling, or discharge from the injections site(s) . Fevers, chills, nausea, or vomiting . Bowel or bladder dysfunction (i.e. inability to urinate or pass stool or difficulty controlling either)  How long does it take for the procedure to work? You should feel relief from your usual pain within the first hour. Again,  this is only expected to last for several hours, at the most. Remember, you may be sore in the middle part of your back from the needles, and you must distinguish this from your usual pain. ____________________________________________________________________________________________ A prescription for Diclofenac tablets has beens sent to your pharmacy.

## 2020-07-02 ENCOUNTER — Other Ambulatory Visit: Payer: Self-pay | Admitting: Physician Assistant

## 2020-07-02 ENCOUNTER — Other Ambulatory Visit: Payer: Self-pay | Admitting: Family Medicine

## 2020-07-02 DIAGNOSIS — I10 Essential (primary) hypertension: Secondary | ICD-10-CM

## 2020-07-02 MED ORDER — HYDRALAZINE HCL 25 MG PO TABS: 25.0000 mg | ORAL_TABLET | Freq: Three times a day (TID) | ORAL | 0 refills | Status: DC

## 2020-07-02 NOTE — Telephone Encounter (Signed)
[facility] faxed refill request for the following medications:  hydrALAZINE (APRESOLINE) 25 MG tablet  Please advise.  Thanks, [employer]

## 2020-07-02 NOTE — Progress Notes (Deleted)
Subjective:   Gloria Mercer is a 79 y.o. female who presents for Medicare Annual (Subsequent) preventive examination.  Review of Systems    ***       Objective:    There were no vitals filed for this visit. There is no height or weight on file to calculate BMI.  Advanced Directives 06/24/2020 09/05/2019 07/09/2018 07/08/2018 08/29/2017  Does Patient Have a Medical Advance Directive? No Yes Yes No No  Type of Advance Directive - [place];Living will [place] - -  Does patient want to make changes to medical advance directive? - - No - Patient declined - -  Copy of [place] in Chart? - Yes - validated most recent copy scanned in chart (See row information) Yes - -  Would patient like information on creating a medical advance directive? - - No - Patient declined No - Patient declined -    Current Medications (verified) Outpatient Encounter Medications as of 07/03/2020  Medication Sig  . azelastine (ASTELIN) 0.1 % nasal spray Place 2 sprays into both nostrils daily. (Patient taking differently: Place 2 sprays into both nostrils daily. As needed)  . beclomethasone (QVAR REDIHALER) 80 MCG/ACT inhaler Inhale into the lungs.  . chlorhexidine (PERIDEX) 0.12 % solution RINSE 15 ML BY MOUTH FOR 30 SECONDS THREE TIMES DAILY  . Cholecalciferol (VITAMIN D3) 5000 units CAPS Take 1 capsule by mouth daily.  . clotrimazole-betamethasone (LOTRISONE) cream APPLY EXTERNALLY TO THE AFFECTED AREA TWICE DAILY AS NEEDED  . diclofenac (VOLTAREN) 75 MG EC tablet Take 1 tablet (75 mg total) by mouth 2 (two) times daily for 21 days.  Marland Kitchen EPINEPHrine 0.3 mg/0.3 mL IJ SOAJ injection as needed.   Marland Kitchen esomeprazole (NEXIUM) 20 MG capsule Take 40 mg by mouth daily at 12 noon.   . ezetimibe (ZETIA) 10 MG tablet Take 1 tablet (10 mg total) by mouth daily.  . hydrALAZINE (APRESOLINE) 25 MG tablet Take 1 tablet (25 mg total) by mouth 3 (three) times daily.  Marland Kitchen  ipratropium (ATROVENT) 0.03 % nasal spray Place 2 sprays into both nostrils every 8 (eight) hours as needed.  . montelukast (SINGULAIR) 10 MG tablet TAKE 1 TABLET(10 MG) BY MOUTH DAILY  . Multiple Vitamins-Minerals (EYE VITAMINS PO) Take by mouth daily.   . Omega-3 Fatty Acids (FISH OIL) 1000 MG CAPS Take 1 capsule by mouth daily.  . pramipexole (MIRAPEX) 0.5 MG tablet Take 1 tablet (0.5 mg total) by mouth at bedtime.  Marland Kitchen PREVIDENT 5000 DRY MOUTH 1.1 % GEL dental gel Place 1 application onto teeth. 2-3xs daily  . triamcinolone (NASACORT ALLERGY 24HR) 55 MCG/ACT AERO nasal inhaler Place 2 sprays into the nose daily.  Marland Kitchen triamterene-hydrochlorothiazide (MAXZIDE-25) 37.5-25 MG tablet TAKE 1 TABLET BY MOUTH DAILY   No facility-administered encounter medications on file as of 07/03/2020.    Allergies (verified) Erythromycin, Losartan, Metoprolol, Simvastatin, Dexlansoprazole, Lisinopril, and Prednisone   History: Past Medical History:  Diagnosis Date  . Allergic rhinitis    on allergy shots, sees Dr. Pryor Ochoa  . Cataract   . GERD (gastroesophageal reflux disease)   . Hypertension   . Macular degeneration    followed by Dr Garwin Brothers at [facility]  . Osteoarthritis   . Sciatica    Past Surgical History:  Procedure Laterality Date  . CHOLECYSTECTOMY  2002  . TUBAL LIGATION  1978   Family History  Problem Relation Age of Onset  . Hypertension Mother   . Seizures Mother   .  Heart disease Father 33       several heart attacks  . Diabetes Father        diet controlled  . Myelodysplastic syndrome Sister   . Prostate cancer Paternal Grandfather   . Breast cancer Neg Hx   . Cervical cancer Neg Hx    Social History   Socioeconomic History  . Marital status: Divorced    Spouse name: Not on file  . Number of children: 1  . Years of education: Not on file  . Highest education level: Bachelor's degree (e.g., BA, AB, BS)  Occupational History  . 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 Smoking status: Former Smoker    Packs/day: 1.00    Years: 10.00    Pack years: 10.00    Types: Cigarettes    Quit date: 11/28/1976    Years since quitting: 43.6  . Smokeless tobacco: Never Used  Vaping Use  . Vaping Use: Never used  Substance and Sexual Activity  . Alcohol use: Yes    Comment: rarely, at holidays  . Drug use: No  . Sexual activity: Not Currently  Other Topics Concern  . Not on file  Social History Narrative  . Not on file   Social Determinants of Health   Financial Resource Strain: Low Risk   . Difficulty of Paying Living Expenses: Not hard at all  Food Insecurity: No Food Insecurity  . Worried About Charity fundraiser in the Last Year: Never true  . Ran Out of Food in the Last Year: Never true  Transportation Needs: No Transportation Needs  . Lack of Transportation (Medical): No  . Lack of Transportation (Non-Medical): No  Physical Activity: Inactive  . Days of Exercise per Week: 0 days  . Minutes of Exercise per Session: 0 min  Stress: No Stress Concern Present  . Feeling of Stress : Not at all  Social Connections: Unknown  . Frequency of Communication with Friends and Family: Patient refused  . Frequency of Social Gatherings with Friends and Family: Patient refused  . Attends Religious Services: Patient refused  . Active Member of Clubs or Organizations: Patient refused  . Attends Archivist Meetings: Patient refused  . Marital Status: Patient refused    Tobacco Counseling Counseling given: Not Answered   Clinical Intake:                 Diabetic?***         Activities of Daily Living In your present state of health, do you have any difficulty performing the following activities: 09/05/2019  Hearing? N  Vision? Y  Comment Due to MD.  Difficulty concentrating or making decisions? N  Walking or climbing stairs? Y  Comment Avoid steps.  Dressing or bathing? N  Doing errands,  shopping? N  Preparing Food and eating ? N  Using the Toilet? N  In the past six months, have you accidently leaked urine? N  Do you have problems with loss of bowel control? N  Managing your Medications? N  Managing your Finances? N  Housekeeping or managing your Housekeeping? Y  Comment Has assistance cleaning.  Some recent data might be hidden    Patient Care Team: Virginia Crews, MD as PCP - General (Family Medicine)  Indicate any recent Medical Services you may have received from other than [facility] providers in the past year (date may be approximate).     Assessment:  This is a routine wellness examination for [place].  Hearing/Vision screen No exam data present  Dietary issues and exercise activities discussed:    Goals    . DIET - REDUCE SUGAR INTAKE     Recommend to cut out sodas, sugar foods and bread to help aid in weight loss.       Depression Screen PHQ 2/9 Scores 06/24/2020 09/05/2019 09/27/2018 08/29/2017  PHQ - 2 Score 0 1 0 2  PHQ- 9 Score - - - 8  Exception Documentation Patient refusal - - -    Fall Risk Fall Risk  06/24/2020 05/05/2020 09/05/2019 09/27/2018 09/27/2018  Falls in the past year? 0 0 0 No No  Number falls in past yr: 0 0 0 - -  Injury with Fall? 0 0 0 - -  Risk Factor Category  - - - - -  Risk for fall due to : No Fall Risks Impaired balance/gait - - -  Follow up Falls evaluation completed Falls evaluation completed - - -    Any stairs in or around the home? {YES/NO:21197} If so, are there any without handrails? {YES/NO:21197} Home free of loose throw rugs in walkways, pet beds, electrical cords, etc? {YES/NO:21197} Adequate lighting in your home to reduce risk of falls? {YES/NO:21197}  ASSISTIVE DEVICES UTILIZED TO PREVENT FALLS:  Life alert? {YES/NO:21197} Use of a cane, walker or w/c? {YES/NO:21197} Grab bars in the bathroom? {YES/NO:21197} Shower chair or bench in shower? {YES/NO:21197} Elevated toilet seat or a handicapped  toilet? {YES/NO:21197}  TIMED UP AND GO:  Was the test performed? {YES/NO:21197}.  Length of time to ambulate 10 feet: *** sec.   {Appearance of TMHD:6222979}  Cognitive Function:        Immunizations Immunization History  Administered Date(s) Administered  . Fluad Quad(high Dose 65+) 12/05/2019  . Influenza Split 10/27/2010  . Influenza, High Dose Seasonal PF 10/10/2013, 11/06/2014, 09/22/2015, 08/29/2017, 09/27/2018  . Influenza, Seasonal, Injecte, Preservative Fre 08/31/2015  . Influenza,trivalent, recombinat, inj, PF 11/07/2012  . Influenza-Unspecified 11/07/2012, 10/10/2013, 08/31/2015, 09/17/2016, 09/27/2016  . Pneumococcal Conjugate-13 06/25/2014  . Pneumococcal Polysaccharide-23 11/30/2007  . Pneumococcal-Unspecified 05/31/2015  . Tdap 11/08/2008  . Zoster 05/24/2014  . Zoster Recombinat (Shingrix) 07/05/2017, 02/05/2018    {TDAP status:2101805} {Flu Vaccine status:2101806} {Pneumococcal vaccine status:2101807} {Covid-19 vaccine status:2101808}  Qualifies for Shingles Vaccine? {YES/NO:21197}  Zostavax completed {YES/NO:21197}  {Shingrix Completed?:2101804}  Screening Tests Health Maintenance  Topic Date Due  . Hepatitis C Screening  Never done  . COVID-19 Vaccine (1) Never done  . INFLUENZA VACCINE  06/29/2020  . TETANUS/TDAP  12/25/2022 (Originally 11/08/2018)  . DEXA SCAN  Completed  . PNA vac Low Risk Adult  Completed    Health Maintenance  Health Maintenance Due  Topic Date Due  . Hepatitis C Screening  Never done  . COVID-19 Vaccine (1) Never done  . INFLUENZA VACCINE  06/29/2020    {Colorectal cancer screening:2101809} {Mammogram status:21018020} {Bone Density status:21018021}  Lung Cancer Screening: (Low Dose CT Chest recommended if Age 29-80 years, 30 pack-year currently smoking OR have quit w/in 15years.) {DOES NOT does:27190::"does not"} qualify.   Lung Cancer Screening Referral: ***  Additional Screening:  Hepatitis C Screening:  {DOES NOT does:27190::"does not"} qualify; Completed ***  Vision Screening: Recommended annual ophthalmology exams for early detection of glaucoma and other disorders of the eye. Is the patient up to date with their annual eye exam?  {YES/NO:21197} Who is the provider or what is the name of the office in which the patient attends  annual eye exams? *** If pt is not established with a provider, would they like to be referred to a provider to establish care? {YES/NO:21197}.   Dental Screening: Recommended annual dental exams for proper oral hygiene  Community Resource Referral / Chronic Care Management: CRR required this visit?  {YES/NO:21197}  CCM required this visit?  {YES/NO:21197}     Plan:     I have personally reviewed and noted the following in the patient's chart:   . Medical and social history . Use of alcohol, tobacco or illicit drugs  . Current medications and supplements . Functional ability and status . Nutritional status . Physical activity . Advanced directives . List of other physicians . Hospitalizations, surgeries, and ER visits in previous 12 months . Vitals . Screenings to include cognitive, depression, and falls . Referrals and appointments  In addition, I have reviewed and discussed with patient certain preventive protocols, quality metrics, and best practice recommendations. A written personalized care plan for preventive services as well as general preventive health recommendations were provided to patient.     Brittay Mogle [place], [place]   0/03/6978   Nurse Notes: ***

## 2020-07-02 NOTE — Telephone Encounter (Signed)
Hydralazine sent.

## 2020-07-02 NOTE — Telephone Encounter (Signed)
Please advise? Medication never filled by pcp.  °

## 2020-07-03 ENCOUNTER — Telehealth: Payer: Self-pay | Admitting: Family Medicine

## 2020-07-03 ENCOUNTER — Other Ambulatory Visit: Payer: Self-pay

## 2020-07-03 NOTE — Telephone Encounter (Signed)
Copied from [place] [id number]. Topic: Medicare AWV >> Jul 03, 2020  9:49 AM Cher Nakai R wrote: Reason for CRM:   Left message for patient to call back to reschedule AWVS appointment for today. She is not due until 09/05/2020. Please reschedule with [facility]

## 2020-07-07 ENCOUNTER — Ambulatory Visit: Payer: Self-pay | Admitting: Family Medicine

## 2020-07-07 ENCOUNTER — Ambulatory Visit: Payer: Medicare Other | Admitting: Student in an Organized Health Care Education/Training Program

## 2020-07-16 ENCOUNTER — Telehealth: Payer: Self-pay | Admitting: Family Medicine

## 2020-07-16 ENCOUNTER — Ambulatory Visit: Payer: Self-pay

## 2020-07-16 NOTE — Telephone Encounter (Signed)
Copied from [place] [id number]. Topic: General - Other >> Jul 16, 2020  3:32 PM Yvette Rack wrote: Reason for CRM: Pt stated her family has covid and she has some questions about the quarantine process. Pt also stated she has some questions about some medication that she was given at the pain clinic. Pt requests call back.

## 2020-07-16 NOTE — Telephone Encounter (Signed)
Patient called and says she was with her grandson for maybe 15 minutes on 07/10/20 and he has since tested positive for COVID. She says when he was there they sat and talked then she hugged him before he left. She says he was having symptoms of allergies, so they thought it was. She says she's not having any symptoms. She found out on 07/12/20 that he and his parents are all sick. She says she has been inside her apartment since then and has cancelled all her appointments. She asked how long will she need to quarantine. She says she doesn't want to get tested unless it's really necessary. Care advice given per protocol, she verbalized understanding. I advised someone from the office will call within 72 hours with Dr. Brita Romp recommendation.  Reason for Disposition . [1] CLOSE CONTACT COVID-19 EXPOSURE within last 14 days AND [2] NO symptoms  Answer Assessment - Initial Assessment Questions 1. COVID-19 CLOSE CONTACT: "Who is the person with the confirmed or suspected COVID-19 infection that you were exposed to?"     Grandson 2. PLACE of CONTACT: "Where were you when you were exposed to COVID-19?" (e.g., home, school, medical waiting room; which city?)      At my house 3. TYPE of CONTACT: "How much contact was there?" (e.g., sitting next to, live in same house, work in same office, same building)     Sit and talk, hugged him 4. DURATION of CONTACT: "How long were you in contact with the COVID-19 patient?" (e.g., a few seconds, passed by person, a few minutes, 15 minutes or longer, live with the patient)     Maybe less than 15 minutes 5. MASK: "Were you wearing a mask?" "Was the other person wearing a mask?" Note: wearing a mask reduces the risk of an otherwise close contact.     No 6. DATE of CONTACT: "When did you have contact with a COVID-19 patient?" (e.g., how many days ago)     07/10/20 7. COMMUNITY SPREAD: "Are there lots of cases of COVID-19 (community spread) where you live?" (See public  health department website, if unsure)       Unknown 8. SYMPTOMS: "Do you have any symptoms?" (e.g., fever, cough, breathing difficulty, loss of taste or smell)      No 9. PREGNANCY OR POSTPARTUM: "Is there any chance you are pregnant?" "When was your last menstrual period?" "Did you deliver in the last 2 weeks?"     No 10. HIGH RISK: "Do you have any heart or lung problems?" "Do you have a weak immune system?" (e.g., heart failure, COPD, asthma, HIV positive, chemotherapy, renal failure, diabetes mellitus, sickle cell anemia, obesity)      No 11. TRAVEL: "Have you traveled out of the country recently?" If Yes, ask: "When and where?" Also ask about out-of-state travel, since the CDC has identified some high-risk cities for community spread in the [place]. Note: Travel becomes less relevant if there is widespread community transmission where the patient lives.       No  Protocols used: CORONAVIRUS (COVID-19) EXPOSURE-A-AH

## 2020-07-16 NOTE — Telephone Encounter (Signed)
COVID questions: Patient called and triaged, see triage encounter.   Medication question: Patient says she went to the pain clinic and was given Dicolfenac Sodium 75 mg DR tab BID for her knee pain and she says this medication has helped tremendously. She asks if she is able to continue taking will Dr. Brita Romp send in a prescription for it. I advised I will send this request over and someone from the office will call with her recommendation.

## 2020-07-17 NOTE — Telephone Encounter (Signed)
This was recommended for short trial while waiting for procedures.  Due to chronic kidney disease, this is not recommended long-term.  Would recommend following up with Dr Holley Raring for the procedure they discussed

## 2020-07-17 NOTE — Telephone Encounter (Signed)
Pt advised.   Thanks,   -[place] Flett  

## 2020-07-17 NOTE — Telephone Encounter (Signed)
If she is vaccinated and asymptomatic, she can forgo testing.  I would quarantine for 14 days from exposure.  If symptoms develop, get tested.  PCR test is the one recommended.

## 2020-07-17 NOTE — Telephone Encounter (Signed)
Pt advised.   Thanks,   -Jamaal Bernasconi  

## 2020-08-06 ENCOUNTER — Ambulatory Visit: Payer: Medicare Other | Admitting: Family Medicine

## 2020-08-15 ENCOUNTER — Ambulatory Visit: Payer: Medicare Other | Admitting: Family Medicine

## 2020-08-22 ENCOUNTER — Other Ambulatory Visit: Payer: Self-pay | Admitting: Physician Assistant

## 2020-08-22 DIAGNOSIS — I10 Essential (primary) hypertension: Secondary | ICD-10-CM

## 2020-09-09 ENCOUNTER — Ambulatory Visit: Payer: Medicare Other | Admitting: Family Medicine

## 2020-09-09 ENCOUNTER — Telehealth: Payer: Self-pay

## 2020-09-09 NOTE — Telephone Encounter (Signed)
Noted  

## 2020-09-09 NOTE — Telephone Encounter (Signed)
Copied from [place] [id number]. Topic: Appointment Scheduling - Scheduling Inquiry for Clinic >> Sep 09, 2020 12:44 PM Greggory Keen D wrote: Reason for CRM: Pt has an appt at 4 today but she said she is feeling bad and will call back to resch

## 2020-09-09 NOTE — Telephone Encounter (Signed)
FYI

## 2020-09-15 NOTE — Progress Notes (Signed)
Subjective:   Gloria Mercer is a 79 y.o. female who presents for Medicare Annual (Subsequent) preventive examination.  I connected with Sheliah Hatch today by telephone and verified that I am speaking with the correct person using two identifiers. Location patient: home Location provider: work Persons participating in the virtual visit: patient, provider.   I discussed the limitations, risks, security and privacy concerns of performing an evaluation and management service by telephone and the availability of in person appointments. I also discussed with the patient that there may be a patient responsible charge related to this service. The patient expressed understanding and verbally consented to this telephonic visit.    Interactive audio and video telecommunications were attempted between this provider and patient, however failed, due to patient having technical difficulties OR patient did not have access to video capability.  We continued and completed visit with audio only.   Review of Systems    N/A  Cardiac Risk Factors include: advanced age (>78men, >15 women);dyslipidemia;hypertension;obesity (BMI >30kg/m2)     Objective:    There were no vitals filed for this visit. There is no height or weight on file to calculate BMI.  Advanced Directives 09/16/2020 06/24/2020 09/05/2019 07/09/2018 07/08/2018 08/29/2017  Does Patient Have a Medical Advance Directive? Yes No Yes Yes No No  Type of Paramedic of [place];Living will - [place];Living will [place] - -  Does patient want to make changes to medical advance directive? - - - No - Patient declined - -  Copy of [place] in Chart? Yes - validated most recent copy scanned in chart (See row information) - Yes - validated most recent copy scanned in chart (See row information) Yes - -  Would patient like information on creating a medical advance  directive? - - - No - Patient declined No - Patient declined -    Current Medications (verified) Outpatient Encounter Medications as of 09/16/2020  Medication Sig  . aspirin 325 MG tablet Take 650 mg by mouth at bedtime.  Marland Kitchen azelastine (ASTELIN) 0.1 % nasal spray Place 2 sprays into both nostrils daily. (Patient taking differently: Place 2 sprays into both nostrils daily. As needed)  . beclomethasone (QVAR REDIHALER) 80 MCG/ACT inhaler Inhale 2 puffs into the lungs 2 (two) times daily.   . chlorhexidine (PERIDEX) 0.12 % solution RINSE 15 ML BY MOUTH FOR 30 SECONDS THREE TIMES DAILY  . Cholecalciferol (VITAMIN D3) 5000 units CAPS Take 1 capsule by mouth daily.  . clotrimazole-betamethasone (LOTRISONE) cream APPLY EXTERNALLY TO THE AFFECTED AREA TWICE DAILY AS NEEDED  . EPINEPHrine 0.3 mg/0.3 mL IJ SOAJ injection as needed.   Marland Kitchen esomeprazole (NEXIUM) 20 MG capsule Take 40 mg by mouth daily at 12 noon.   . ezetimibe (ZETIA) 10 MG tablet Take 1 tablet (10 mg total) by mouth daily.  . hydrALAZINE (APRESOLINE) 25 MG tablet TAKE 1 TABLET(25 MG) BY MOUTH THREE TIMES DAILY  . ipratropium (ATROVENT) 0.03 % nasal spray Place 2 sprays into both nostrils every 8 (eight) hours as needed.  Marland Kitchen levocetirizine (XYZAL) 5 MG tablet Take 5 mg by mouth every evening.  . montelukast (SINGULAIR) 10 MG tablet TAKE 1 TABLET(10 MG) BY MOUTH DAILY  . Multiple Vitamins-Minerals (EYE VITAMINS PO) Take 2 tablets by mouth daily.   . Omega-3 Fatty Acids (FISH OIL) 1000 MG CAPS Take 1 capsule by mouth daily.  . pramipexole (MIRAPEX) 0.5 MG tablet Take 1 tablet (0.5 mg total)  by mouth at bedtime.  . triamcinolone (NASACORT ALLERGY 24HR) 55 MCG/ACT AERO nasal inhaler Place 2 sprays into the nose daily.  Marland Kitchen triamterene-hydrochlorothiazide (MAXZIDE-25) 37.5-25 MG tablet TAKE 1 TABLET BY MOUTH DAILY  . PREVIDENT 5000 DRY MOUTH 1.1 % GEL dental gel Place 1 application onto teeth. 2-3xs daily (Patient not taking: Reported on 09/16/2020)    No facility-administered encounter medications on file as of 09/16/2020.    Allergies (verified) Erythromycin, Losartan, Metoprolol, Simvastatin, Dexlansoprazole, Lisinopril, and Prednisone   History: Past Medical History:  Diagnosis Date  . Allergic rhinitis    on allergy shots, sees Dr. Pryor Ochoa  . Cataract   . GERD (gastroesophageal reflux disease)   . Hypertension   . Macular degeneration    followed by Dr Garwin Brothers at [facility]  . Osteoarthritis   . Sciatica    Past Surgical History:  Procedure Laterality Date  . CHOLECYSTECTOMY  2002  . TUBAL LIGATION  1978   Family History  Problem Relation Age of Onset  . Hypertension Mother   . Seizures Mother   . Heart disease Father 57       several heart attacks  . Diabetes Father        diet controlled  . Myelodysplastic syndrome Sister   . Prostate cancer Paternal Grandfather   . Breast cancer Neg Hx   . Cervical cancer Neg Hx    Social History   Socioeconomic History  . Marital status: Divorced    Spouse name: Not on file  . Number of children: 1  . Years of education: Not on file  . Highest education level: Bachelor's degree (e.g., BA, AB, BS)  Occupational History  . Occupation: retired in 2001    Comment: social work Scientist, physiological  Tobacco Use  . Smoking status: Former Smoker    Packs/day: 1.00    Years: 10.00    Pack years: 10.00    Types: Cigarettes    Quit date: 11/28/1976    Years since quitting: 43.8  . Smokeless tobacco: Never Used  Vaping Use  . Vaping Use: Never used  Substance and Sexual Activity  . Alcohol use: Not Currently    Comment: rarely, at holidays  . Drug use: No  . Sexual activity: Not Currently  Other Topics Concern  . Not on file  Social History Narrative  . Not on file   Social Determinants of Health   Financial Resource Strain: Low Risk   . Difficulty of Paying Living Expenses: Not hard at all  Food Insecurity: No Food Insecurity  . Worried About Charity fundraiser in the  Last Year: Never true  . Ran Out of Food in the Last Year: Never true  Transportation Needs: No Transportation Needs  . Lack of Transportation (Medical): No  . Lack of Transportation (Non-Medical): No  Physical Activity: Inactive  . Days of Exercise per Week: 0 days  . Minutes of Exercise per Session: 0 min  Stress: No Stress Concern Present  . Feeling of Stress : Not at all  Social Connections: Moderately Isolated  . Frequency of Communication with Friends and Family: More than three times a week  . Frequency of Social Gatherings with Friends and Family: More than three times a week  . Attends Religious Services: Never  . Active Member of Clubs or Organizations: Yes  . Attends Archivist Meetings: More than 4 times per year  . Marital Status: Divorced    Tobacco Counseling Counseling given: Not Answered   Clinical  Intake:  Pre-visit preparation completed: Yes  Pain : No/denies pain     Nutritional Risks: None Diabetes: No  How often do you need to have someone help you when you read instructions, pamphlets, or other written materials from your doctor or pharmacy?: 1 - Never  Diabetic? No  Interpreter Needed?: No  Information entered by :: [facility], LPN   Activities of Daily Living In your present state of health, do you have any difficulty performing the following activities: 09/16/2020  Hearing? N  Vision? Y  Comment Due to MD.  Difficulty concentrating or making decisions? N  Walking or climbing stairs? Y  Comment Due to knee pains. Avoids steps.  Dressing or bathing? N  Doing errands, shopping? N  Preparing Food and eating ? N  Using the Toilet? N  In the past six months, have you accidently leaked urine? N  Do you have problems with loss of bowel control? N  Managing your Medications? N  Managing your Finances? N  Housekeeping or managing your Housekeeping? Y  Comment Has assistance with heavy duty cleaning.  Some recent data might be  hidden    Patient Care Team: Virginia Crews, MD as PCP - General (Family Medicine) Mettu, Leandro Reasoner, MD as Referring Physician (Ophthalmology)  Indicate any recent Medical Services you may have received from other than [facility] providers in the past year (date may be approximate).     Assessment:   This is a routine wellness examination for [place].  Hearing/Vision screen No exam data present  Dietary issues and exercise activities discussed: Current Exercise Habits: Home exercise routine, Type of exercise: stretching, Time (Minutes): 10, Frequency (Times/Week): 7, Weekly Exercise (Minutes/Week): 70, Intensity: Mild, Exercise limited by: orthopedic condition(s)  Goals    . DIET - REDUCE SUGAR INTAKE     Recommend to cut out sodas, sugar foods and bread to help aid in weight loss.       Depression Screen PHQ 2/9 Scores 09/16/2020 06/24/2020 09/05/2019 09/27/2018 08/29/2017  PHQ - 2 Score 0 0 1 0 2  PHQ- 9 Score - - - - 8  Exception Documentation - Patient refusal - - -    Fall Risk Fall Risk  09/16/2020 06/24/2020 05/05/2020 09/05/2019 09/27/2018  Falls in the past year? 0 0 0 0 No  Number falls in past yr: 0 0 0 0 -  Injury with Fall? 0 0 0 0 -  Risk Factor Category  - - - - -  Risk for fall due to : - No Fall Risks Impaired balance/gait - -  Follow up - Falls evaluation completed Falls evaluation completed - -    Any stairs in or around the home? No  If so, are there any without handrails? No  Home free of loose throw rugs in walkways, pet beds, electrical cords, etc? Yes  Adequate lighting in your home to reduce risk of falls? Yes   ASSISTIVE DEVICES UTILIZED TO PREVENT FALLS:  Life alert? No  Use of a cane, walker or w/c? Yes  Grab bars in the bathroom? No  Shower chair or bench in shower? No  Elevated toilet seat or a handicapped toilet? Yes    Cognitive Function:     6CIT Screen 09/16/2020  What Year? 0 points  What month? 0 points  What time? 0 points    Count back from 20 0 points  Months in reverse 0 points  Repeat phrase 0 points  Total Score 0    Immunizations Immunization  History  Administered Date(s) Administered  . Fluad Quad(high Dose 65+) 12/05/2019  . Influenza Split 10/27/2010  . Influenza, High Dose Seasonal PF 10/10/2013, 11/06/2014, 09/22/2015, 08/29/2017, 09/27/2018  . Influenza, Seasonal, Injecte, Preservative Fre 08/31/2015  . Influenza,trivalent, recombinat, inj, PF 11/07/2012  . Influenza-Unspecified 11/07/2012, 10/10/2013, 08/31/2015, 09/17/2016, 09/27/2016  . PFIZER SARS-COV-2 Vaccination 12/25/2019, 01/15/2020  . Pneumococcal Conjugate-13 06/25/2014  . Pneumococcal Polysaccharide-23 11/30/2007  . Pneumococcal-Unspecified 05/31/2015  . Tdap 11/08/2008  . Zoster 05/24/2014  . Zoster Recombinat (Shingrix) 07/05/2017, 02/05/2018    TDAP status: Due, Education has been provided regarding the importance of this vaccine. Advised may receive this vaccine at local pharmacy or Health Dept. Aware to provide a copy of the vaccination record if obtained from local pharmacy or Health Dept. Verbalized acceptance and understanding. Flu Vaccine status: Declined, Education has been provided regarding the importance of this vaccine but patient still declined. Advised may receive this vaccine at local pharmacy or Health Dept. Aware to provide a copy of the vaccination record if obtained from local pharmacy or Health Dept. Verbalized acceptance and understanding. Pneumococcal vaccine status: Up to date Covid-19 vaccine status: Completed vaccines  Qualifies for Shingles Vaccine? Yes   Zostavax completed Yes   Shingrix Completed?: Yes  Screening Tests Health Maintenance  Topic Date Due  . INFLUENZA VACCINE  06/29/2020  . TETANUS/TDAP  12/25/2022 (Originally 11/08/2018)  . DEXA SCAN  Completed  . COVID-19 Vaccine  Completed  . PNA vac Low Risk Adult  Completed    Health Maintenance  Health Maintenance Due  Topic Date Due   . INFLUENZA VACCINE  06/29/2020    Colorectal cancer screening: No longer required.  Mammogram status: No longer required.  Bone Density status: Completed 05/16/13. Results reflect: Previous DEXA scan was normal. No repeat needed unless advised by a physician.  Lung Cancer Screening: (Low Dose CT Chest recommended if Age 87-80 years, 30 pack-year currently smoking OR have quit w/in 15years.) does not qualify.    Additional Screening:  Vision Screening: Recommended annual ophthalmology exams for early detection of glaucoma and other disorders of the eye. Is the patient up to date with their annual eye exam?  Yes  Who is the provider or what is the name of the office in which the patient attends annual eye exams? Dr Dennison Isabellarose @ [facility] If pt is not established with a provider, would they like to be referred to a provider to establish care? No .   Dental Screening: Recommended annual dental exams for proper oral hygiene  Community Resource Referral / Chronic Care Management: CRR required this visit?  No   CCM required this visit?  No      Plan:     I have personally reviewed and noted the following in the patient's chart:   . Medical and social history . Use of alcohol, tobacco or illicit drugs  . Current medications and supplements . Functional ability and status . Nutritional status . Physical activity . Advanced directives . List of other physicians . Hospitalizations, surgeries, and ER visits in previous 12 months . Vitals . Screenings to include cognitive, depression, and falls . Referrals and appointments  In addition, I have reviewed and discussed with patient certain preventive protocols, quality metrics, and best practice recommendations. A written personalized care plan for preventive services as well as general preventive health recommendations were provided to patient.     Elveta Rape [place], [place]   [id number]   Nurse Notes: Pt to receive her flu shot at next  in  office apt on 09/29/20.

## 2020-09-16 ENCOUNTER — Other Ambulatory Visit: Payer: Self-pay

## 2020-09-16 ENCOUNTER — Ambulatory Visit (INDEPENDENT_AMBULATORY_CARE_PROVIDER_SITE_OTHER): Payer: Medicare Other

## 2020-09-16 DIAGNOSIS — Z0000 Encounter for general adult medical examination without abnormal findings: Secondary | ICD-10-CM

## 2020-09-16 NOTE — Patient Instructions (Signed)
Gloria Mercer , Thank you for taking time to come for your Medicare Wellness Visit. I appreciate your ongoing commitment to your health goals. Please review the following plan we discussed and let me know if I can assist you in the future.   Screening recommendations/referrals: Colonoscopy: No longer required.  Mammogram: No longer required.  Bone Density: Previous DEXA scan was normal. No repeat needed unless advised by a physician. Recommended yearly ophthalmology/optometry visit for glaucoma screening and checkup Recommended yearly dental visit for hygiene and checkup  Vaccinations: Influenza vaccine: Currently due. Will receive at next in office apt.  Pneumococcal vaccine: Completed series Tdap vaccine: Currently due, declined at this time.  Shingles vaccine: Completed series    Advanced directives: Currently on file.  Conditions/risks identified: Recommend to cut out sodas, sugar foods and bread to help aid in weight loss.   Next appointment: 09/29/20 @ 10:40 AM with Dr Brita Romp    Preventive Care 65 Years and Older, Female Preventive care refers to lifestyle choices and visits with your health care provider that can promote health and wellness. What does preventive care include?  A yearly physical exam. This is also called an annual well check.  Dental exams once or twice a year.  Routine eye exams. Ask your health care provider how often you should have your eyes checked.  Personal lifestyle choices, including:  Daily care of your teeth and gums.  Regular physical activity.  Eating a healthy diet.  Avoiding tobacco and drug use.  Limiting alcohol use.  Practicing safe sex.  Taking low-dose aspirin every day.  Taking vitamin and mineral supplements as recommended by your health care provider. What happens during an annual well check? The services and screenings done by your health care provider during your annual well check will depend on your age, overall  health, lifestyle risk factors, and family history of disease. Counseling  Your health care provider may ask you questions about your:  Alcohol use.  Tobacco use.  Drug use.  Emotional well-being.  Home and relationship well-being.  Sexual activity.  Eating habits.  History of falls.  Memory and ability to understand (cognition).  Work and work Statistician.  Reproductive health. Screening  You may have the following tests or measurements:  Height, weight, and BMI.  Blood pressure.  Lipid and cholesterol levels. These may be checked every 5 years, or more frequently if you are over 66 years old.  Skin check.  Lung cancer screening. You may have this screening every year starting at age 80 if you have a 30-pack-year history of smoking and currently smoke or have quit within the past 15 years.  Fecal occult blood test (FOBT) of the stool. You may have this test every year starting at age 80.  Flexible sigmoidoscopy or colonoscopy. You may have a sigmoidoscopy every 5 years or a colonoscopy every 10 years starting at age 53.  Hepatitis C blood test.  Hepatitis B blood test.  Sexually transmitted disease (STD) testing.  Diabetes screening. This is done by checking your blood sugar (glucose) after you have not eaten for a while (fasting). You may have this done every 1-3 years.  Bone density scan. This is done to screen for osteoporosis. You may have this done starting at age 80.  Mammogram. This may be done every 1-2 years. Talk to your health care provider about how often you should have regular mammograms. Talk with your health care provider about your test results, treatment options, and if necessary, the  need for more tests. Vaccines  Your health care provider may recommend certain vaccines, such as:  Influenza vaccine. This is recommended every year.  Tetanus, diphtheria, and acellular pertussis (Tdap, Td) vaccine. You may need a Td booster every 10  years.  Zoster vaccine. You may need this after age 60.  Pneumococcal 13-valent conjugate (PCV13) vaccine. One dose is recommended after age 80.  Pneumococcal polysaccharide (PPSV23) vaccine. One dose is recommended after age 80. Talk to your health care provider about which screenings and vaccines you need and how often you need them. This information is not intended to replace advice given to you by your health care provider. Make sure you discuss any questions you have with your health care provider. Document Released: 12/12/2015 Document Revised: 08/04/2016 Document Reviewed: 09/16/2015 Elsevier Interactive Patient Education  2017 [place] Prevention in the Home Falls can cause injuries. They can happen to people of all ages. There are many things you can do to make your home safe and to help prevent falls. What can I do on the outside of my home?  Regularly fix the edges of walkways and driveways and fix any cracks.  Remove anything that might make you trip as you walk through a door, such as a raised step or threshold.  Trim any bushes or trees on the path to your home.  Use bright outdoor lighting.  Clear any walking paths of anything that might make someone trip, such as rocks or tools.  Regularly check to see if handrails are loose or broken. Make sure that both sides of any steps have handrails.  Any raised decks and porches should have guardrails on the edges.  Have any leaves, snow, or ice cleared regularly.  Use sand or salt on walking paths during winter.  Clean up any spills in your garage right away. This includes oil or grease spills. What can I do in the bathroom?  Use night lights.  Install grab bars by the toilet and in the tub and shower. Do not use towel bars as grab bars.  Use non-skid mats or decals in the tub or shower.  If you need to sit down in the shower, use a plastic, non-slip stool.  Keep the floor dry. Clean up any water that  spills on the floor as soon as it happens.  Remove soap buildup in the tub or shower regularly.  Attach bath mats securely with double-sided non-slip rug tape.  Do not have throw rugs and other things on the floor that can make you trip. What can I do in the bedroom?  Use night lights.  Make sure that you have a light by your bed that is easy to reach.  Do not use any sheets or blankets that are too big for your bed. They should not hang down onto the floor.  Have a firm chair that has side arms. You can use this for support while you get dressed.  Do not have throw rugs and other things on the floor that can make you trip. What can I do in the kitchen?  Clean up any spills right away.  Avoid walking on wet floors.  Keep items that you use a lot in easy-to-reach places.  If you need to reach something above you, use a strong step stool that has a grab bar.  Keep electrical cords out of the way.  Do not use floor polish or wax that makes floors slippery. If you must use wax,  use non-skid floor wax.  Do not have throw rugs and other things on the floor that can make you trip. What can I do with my stairs?  Do not leave any items on the stairs.  Make sure that there are handrails on both sides of the stairs and use them. Fix handrails that are broken or loose. Make sure that handrails are as long as the stairways.  Check any carpeting to make sure that it is firmly attached to the stairs. Fix any carpet that is loose or worn.  Avoid having throw rugs at the top or bottom of the stairs. If you do have throw rugs, attach them to the floor with carpet tape.  Make sure that you have a light switch at the top of the stairs and the bottom of the stairs. If you do not have them, ask someone to add them for you. What else can I do to help prevent falls?  Wear shoes that:  Do not have high heels.  Have rubber bottoms.  Are comfortable and fit you well.  Are closed at the  toe. Do not wear sandals.  If you use a stepladder:  Make sure that it is fully opened. Do not climb a closed stepladder.  Make sure that both sides of the stepladder are locked into place.  Ask someone to hold it for you, if possible.  Clearly mark and make sure that you can see:  Any grab bars or handrails.  First and last steps.  Where the edge of each step is.  Use tools that help you move around (mobility aids) if they are needed. These include:  Canes.  Walkers.  Scooters.  Crutches.  Turn on the lights when you go into a dark area. Replace any light bulbs as soon as they burn out.  Set up your furniture so you have a clear path. Avoid moving your furniture around.  If any of your floors are uneven, fix them.  If there are any pets around you, be aware of where they are.  Review your medicines with your doctor. Some medicines can make you feel dizzy. This can increase your chance of falling. Ask your doctor what other things that you can do to help prevent falls. This information is not intended to replace advice given to you by your health care provider. Make sure you discuss any questions you have with your health care provider. Document Released: 09/11/2009 Document Revised: 04/22/2016 Document Reviewed: 12/20/2014 Elsevier Interactive Patient Education  2017 [employer].

## 2020-09-29 ENCOUNTER — Ambulatory Visit (INDEPENDENT_AMBULATORY_CARE_PROVIDER_SITE_OTHER): Payer: Medicare Other | Admitting: Family Medicine

## 2020-09-29 ENCOUNTER — Other Ambulatory Visit: Payer: Self-pay

## 2020-09-29 ENCOUNTER — Encounter: Payer: Self-pay | Admitting: Family Medicine

## 2020-09-29 VITALS — BP 142/70 | HR 73 | Temp 97.7°F | Ht 61.0 in | Wt 244.0 lb

## 2020-09-29 DIAGNOSIS — J411 Mucopurulent chronic bronchitis: Secondary | ICD-10-CM | POA: Diagnosis not present

## 2020-09-29 DIAGNOSIS — L239 Allergic contact dermatitis, unspecified cause: Secondary | ICD-10-CM

## 2020-09-29 DIAGNOSIS — R7303 Prediabetes: Secondary | ICD-10-CM | POA: Diagnosis not present

## 2020-09-29 DIAGNOSIS — Z23 Encounter for immunization: Secondary | ICD-10-CM | POA: Diagnosis not present

## 2020-09-29 DIAGNOSIS — D692 Other nonthrombocytopenic purpura: Secondary | ICD-10-CM | POA: Diagnosis not present

## 2020-09-29 DIAGNOSIS — I10 Essential (primary) hypertension: Secondary | ICD-10-CM | POA: Diagnosis not present

## 2020-09-29 DIAGNOSIS — E782 Mixed hyperlipidemia: Secondary | ICD-10-CM

## 2020-09-29 MED ORDER — TRIAMCINOLONE ACETONIDE 0.5 % EX OINT: 1.0000 "application " | TOPICAL_OINTMENT | Freq: Two times a day (BID) | CUTANEOUS | 5 refills | Status: DC

## 2020-09-29 NOTE — Addendum Note (Signed)
Addended by: Ashley Royalty E on: 09/29/2020 03:46 PM   Modules accepted: Orders

## 2020-09-29 NOTE — Assessment & Plan Note (Signed)
Chronic and stable No changes in medication

## 2020-09-29 NOTE — Progress Notes (Signed)
Established patient visit   Patient: Gloria Mercer   DOB: January 05, 1941   79 y.o. Female  MRN: [id number] Visit Date: 09/29/2020  Today's healthcare provider: Lavon Paganini, MD   No chief complaint on file.  Subjective    Rash This is a recurrent problem. The current episode started 1 to 4 weeks ago. The problem has been waxing and waning since onset. The affected locations include the chest. The rash is characterized by itchiness and redness. She was exposed to nothing. Pertinent negatives include no congestion, cough, diarrhea, eye pain, facial edema, fatigue, fever, rhinorrhea, shortness of breath or sore throat. Past treatments include antihistamine and topical steroids. The treatment provided no relief.    Hypertension, follow-up  BP Readings from Last 3 Encounters:  09/29/20 (!) 142/70  06/24/20 (!) 179/77  05/05/20 (!) 144/77   Wt Readings from Last 3 Encounters:  09/29/20 244 lb (110.7 kg)  06/24/20 (!) 248 lb (112.5 kg)  05/05/20 254 lb (115.2 kg)     She was last seen for hypertension 5 months ago.  BP at that visit was 144/77. Management since that visit includes stopping amlodipine and started hydralazine 25mg  three times a day.  Pt states she is taking it two times a day.  She reports excellent compliance with treatment. She is not having side effects.  She is following a Low Sodium diet. She is exercising. She does not smoke.  Use of agents associated with hypertension: none.   Outside blood pressures have not been checked recently at home. Symptoms: No chest pain No chest pressure  No palpitations No syncope  No dyspnea No orthopnea  No paroxysmal nocturnal dyspnea Yes lower extremity edema   Pertinent labs: Lab Results  Component Value Date   CHOL 228 (H) 05/05/2020   HDL 81 05/05/2020   LDLCALC 125 (H) 05/05/2020   TRIG 125 05/05/2020   CHOLHDL 3.1 06/22/2018   Lab Results  Component Value Date   NA 138 05/05/2020   K 4.3 05/05/2020     CREATININE 1.22 (H) 05/05/2020   GFRNONAA 42 (L) 05/05/2020   GFRAA 49 (L) 05/05/2020   GLUCOSE 117 (H) 05/05/2020     The 10-year ASCVD risk score Mikey Bussing DC Jr., et al., 2013) is: 38.6%   ---------------------------------------------------------------------------------------------------   Patient Active Problem List   Diagnosis Date Noted  . Chronic instability of right knee 06/24/2020  . Senile purpura ([place]) 05/06/2020  . Chronic pain of right knee 05/05/2020  . Nausea 05/05/2020  . Chronic back pain 07/09/2018  . Constipation 01/12/2018  . RLS (restless legs syndrome) 08/29/2017  . Pedal edema 08/29/2017  . Essential hypertension   . Primary osteoarthritis of right knee   . Gastroesophageal reflux disease without esophagitis   . Cataract   . Macular degeneration   . Allergic rhinitis   . Hypertensive heart disease without heart failure 07/18/2017  . Snoring 07/18/2017  . Prediabetes 07/05/2017  . Colon polyps 01/12/2017  . Diverticulosis of large intestine without hemorrhage 01/12/2017  . External hemorrhoids 01/12/2017  . Gastric nodule 01/12/2017  . Hiatal hernia 01/12/2017  . Internal hemorrhoids 01/12/2017  . Lipoma of colon 01/12/2017  . Multiple gastric polyps 01/12/2017  . Schatzki's ring of distal esophagus 01/12/2017  . Bloating 12/23/2016  . Dysphagia 12/23/2016  . Epigastric pain 12/23/2016  . Heartburn 12/23/2016  . History of colonic polyps 12/23/2016  . Rectal bleeding 12/23/2016  . Chronic renal impairment, stage 3 (moderate) ([place]) 04/02/2016  . Age-related  nuclear cataract of both eyes 10/31/2015  . Exudative age-related macular degeneration of right eye ([place]) 10/31/2015  . Nuclear sclerotic cataract of both eyes 10/31/2015  . Nonexudative age-related macular degeneration, left eye, advanced atrophic with subfoveal involvement 10/31/2015  . Bronchitis, mucopurulent recurrent ([place]) 03/31/2015  . Age-related macular degeneration, dry, left eye  02/18/2015  . Age-related macular degeneration, wet, right eye ([place]) 02/18/2015  . Morbid obesity ([place]) 05/23/2014  . Diastolic dysfunction 68/34/1962  . Intertriginous candidiasis 06/16/2013  . Mixed hyperlipidemia 10/19/2012  . PA (pernicious anemia) 10/19/2012  . Vitamin D deficiency 10/19/2012   Past Medical History:  Diagnosis Date  . Allergic rhinitis    on allergy shots, sees Dr. Pryor Ochoa  . Cataract   . GERD (gastroesophageal reflux disease)   . Hypertension   . Macular degeneration    [facility] Dr Garwin Brothers [facility]  . Osteoarthritis   . Sciatica    Social History   Tobacco Use  . Smoking status: Former Smoker    Packs/day: 1.00    Years: 10.00    Pack years: 10.00    Types: Cigarettes    Quit date: 11/28/1976    Years since quitting: 43.8  . Smokeless tobacco: Never Used  Vaping Use  . Vaping Use: Never used  Substance Use Topics  . Alcohol use: Not Currently    Comment: rarely, at holidays  . Drug use: No   Allergies  Allergen Reactions  . Erythromycin Other (See Comments)    Other reaction(s): Headache Headache and GI upset Headache and GI upset  . Losartan Nausea And Vomiting  . Metoprolol Nausea And Vomiting  . Simvastatin Other (See Comments)    Other reaction(s): Muscle Pain Pain lower extremities Pain lower extremities  . Dexlansoprazole Diarrhea  . Lisinopril Cough  . Prednisone     Other reaction(s): Constipation GI upset     Medications: Outpatient Medications Prior to Visit  Medication Sig  . aspirin 325 MG tablet Take 650 mg by mouth at bedtime.  Marland Kitchen azelastine (ASTELIN) 0.1 % nasal spray Place 2 sprays into both nostrils daily. (Patient taking differently: Place 2 sprays into both nostrils daily. As needed)  . beclomethasone (QVAR REDIHALER) 80 MCG/ACT inhaler Inhale 2 puffs into the lungs 2 (two) times daily.   . chlorhexidine (PERIDEX) 0.12 % solution RINSE 15 ML BY MOUTH FOR 30 SECONDS THREE TIMES DAILY  . Cholecalciferol (VITAMIN  D3) 5000 units CAPS Take 1 capsule by mouth daily.  . clotrimazole-betamethasone (LOTRISONE) cream APPLY EXTERNALLY TO THE AFFECTED AREA TWICE DAILY AS NEEDED  . EPINEPHrine 0.3 mg/0.3 mL IJ SOAJ injection as needed.   Marland Kitchen esomeprazole (NEXIUM) 20 MG capsule Take 40 mg by mouth daily at 12 noon.   . ezetimibe (ZETIA) 10 MG tablet Take 1 tablet (10 mg total) by mouth daily.  . hydrALAZINE (APRESOLINE) 25 MG tablet TAKE 1 TABLET(25 MG) BY MOUTH THREE TIMES DAILY  . ipratropium (ATROVENT) 0.03 % nasal spray Place 2 sprays into both nostrils every 8 (eight) hours as needed.  Marland Kitchen levocetirizine (XYZAL) 5 MG tablet Take 5 mg by mouth every evening.  . montelukast (SINGULAIR) 10 MG tablet TAKE 1 TABLET(10 MG) BY MOUTH DAILY  . Multiple Vitamins-Minerals (EYE VITAMINS PO) Take 2 tablets by mouth daily.   . Omega-3 Fatty Acids (FISH OIL) 1000 MG CAPS Take 1 capsule by mouth daily.  . pramipexole (MIRAPEX) 0.5 MG tablet Take 1 tablet (0.5 mg total) by mouth at bedtime.  Marland Kitchen PREVIDENT 5000 DRY  MOUTH 1.1 % GEL dental gel Place 1 application onto teeth. 2-3xs daily  . triamcinolone (NASACORT ALLERGY 24HR) 55 MCG/ACT AERO nasal inhaler Place 2 sprays into the nose daily.  Marland Kitchen triamterene-hydrochlorothiazide (MAXZIDE-25) 37.5-25 MG tablet TAKE 1 TABLET BY MOUTH DAILY   No facility-administered medications prior to visit.    Review of Systems  Constitutional: Negative.  Negative for fatigue and fever.  HENT: Negative for congestion, rhinorrhea and sore throat.   Eyes: Negative for pain.  Respiratory: Negative.  Negative for cough and shortness of breath.   Cardiovascular: Positive for leg swelling. Negative for chest pain and palpitations.  Gastrointestinal: Negative.  Negative for diarrhea.  Skin: Positive for rash.  Allergic/Immunologic: Positive for environmental allergies.  Neurological: Positive for headaches (Occasionally). Negative for dizziness and light-headedness.      Objective    BP (!) 142/70  (BP Location: Right Wrist, Patient Position: Sitting, Cuff Size: Large)   Pulse 73   Temp 97.7 F (36.5 C) (Oral)   Ht 5\' 1"  (1.549 m)   Wt 244 lb (110.7 kg)   SpO2 98%   BMI 46.10 kg/m    Physical Exam Vitals reviewed.  Constitutional:      General: She is not in acute distress.    Appearance: Normal appearance. She is well-developed. She is not diaphoretic.  HENT:     Head: Normocephalic and atraumatic.  Eyes:     General: No scleral icterus.    Conjunctiva/sclera: Conjunctivae normal.  Neck:     Thyroid: No thyromegaly.  Cardiovascular:     Rate and Rhythm: Normal rate and regular rhythm.     Pulses: Normal pulses.     Heart sounds: Normal heart sounds. No murmur heard.   Pulmonary:     Effort: Pulmonary effort is normal. No respiratory distress.     Breath sounds: Normal breath sounds. No wheezing, rhonchi or rales.  Musculoskeletal:     Cervical back: Neck supple.     Right lower leg: No edema.     Left lower leg: No edema.  Lymphadenopathy:     Cervical: No cervical adenopathy.  Skin:    General: Skin is warm and dry.     Findings: Rash (erythematous over L shoulder and chest, non-tender) present.  Neurological:     Mental Status: She is alert and oriented to person, place, and time. Mental status is at baseline.  Psychiatric:        Mood and Affect: Mood normal.        Behavior: Behavior normal.       No results found for any visits on 09/29/20.  Assessment & Plan     Problem List Items Addressed This Visit      Cardiovascular and Mediastinum   Essential hypertension - Primary    Blood pressure remains slightly elevated today and she states is similar at home Continue Maxide at current dose Increase hydralazine to 3 times daily as she is only been taking this twice daily Recheck metabolic panel      Relevant Orders   Basic Metabolic Panel (BMET)   Senile purpura (HCC)    Chronic and stable Continue to monitor        Respiratory    Bronchitis, mucopurulent recurrent (HCC)    Chronic and stable No changes in medication        Other   Mixed hyperlipidemia    Reviewed last lipid panel-well-controlled Not currently on a statin Discussed diet and exercise  Morbid obesity ([place])    Discussed importance of healthy weight management Discussed diet and exercise       Prediabetes    Recheck A1c      Relevant Orders   Hemoglobin A1c    Other Visit Diagnoses    Allergic contact dermatitis, unspecified trigger        -Recurrent problem -Dermatology has previously believe this is an allergic dermatitis as well -No signs of infection -No new contacts that she is aware of -Unclear trigger -Trial of triamcinolone ointment twice daily until resolution -Return precautions discussed   Return in about 6 months (around 03/29/2021) for chronic disease f/u.      I, Lavon Paganini, MD, have reviewed all documentation for this visit. The documentation on 09/29/20 for the exam, diagnosis, procedures, and orders are all accurate and complete.   Virginia Curl, Dionne Bucy, MD, MPH [facility]

## 2020-09-29 NOTE — Assessment & Plan Note (Signed)
Reviewed last lipid panel - well controlled ?Not currently on a statin ?Discussed diet and exercise  ?

## 2020-09-29 NOTE — Assessment & Plan Note (Signed)
Chronic and stable Continue to monitor 

## 2020-09-29 NOTE — Assessment & Plan Note (Signed)
Recheck A1c 

## 2020-09-29 NOTE — Assessment & Plan Note (Signed)
Discussed importance of healthy weight management Discussed diet and exercise  

## 2020-09-29 NOTE — Assessment & Plan Note (Signed)
Blood pressure remains slightly elevated today and she states is similar at home Continue Maxide at current dose Increase hydralazine to 3 times daily as she is only been taking this twice daily Recheck metabolic panel

## 2020-09-30 LAB — BASIC METABOLIC PANEL
BUN/Creatinine Ratio: 24 (ref 12–28)
BUN: 23 mg/dL (ref 8–27)
CO2: 24 mmol/L (ref 20–29)
Calcium: 9.5 mg/dL (ref 8.7–10.3)
Chloride: 98 mmol/L (ref 96–106)
Creatinine, Ser: 0.97 mg/dL (ref 0.57–1.00)
GFR calc Af Amer: 64 mL/min/{1.73_m2} (ref >59)
GFR calc non Af Amer: 56 mL/min/{1.73_m2} — ABNORMAL LOW (ref >59)
Glucose: 98 mg/dL (ref 65–99)
Potassium: 3.9 mmol/L (ref 3.5–5.2)
Sodium: 136 mmol/L (ref 134–144)

## 2020-09-30 LAB — HEMOGLOBIN A1C
Est. average glucose Bld gHb Est-mCnc: 114 mg/dL
Hgb A1c MFr Bld: 5.6 % (ref 4.8–5.6)

## 2020-10-10 ENCOUNTER — Other Ambulatory Visit: Payer: Self-pay | Admitting: Family Medicine

## 2020-10-10 DIAGNOSIS — I10 Essential (primary) hypertension: Secondary | ICD-10-CM

## 2020-11-03 DIAGNOSIS — H353122 Nonexudative age-related macular degeneration, left eye, intermediate dry stage: Secondary | ICD-10-CM | POA: Diagnosis not present

## 2020-11-03 DIAGNOSIS — H353212 Exudative age-related macular degeneration, right eye, with inactive choroidal neovascularization: Secondary | ICD-10-CM | POA: Diagnosis not present

## 2020-11-19 ENCOUNTER — Telehealth: Payer: Self-pay

## 2020-11-19 NOTE — Telephone Encounter (Signed)
Copied from [id number]. Topic: General - Other >> Nov 19, 2020 12:01 PM Rainey Pines A wrote: Patient would like a callback from Mickel Baas in regards to her losing part of a prescription. Please advise

## 2020-11-26 ENCOUNTER — Other Ambulatory Visit: Payer: Self-pay

## 2020-11-26 NOTE — Telephone Encounter (Signed)
Copied from CRM [id number]. Topic: General - Inquiry >> Nov 26, 2020  2:01 PM Adrian Prince D wrote: Reason for CRM: Patient has some questions about her medication. She can be reached at [phone]. Please advise

## 2020-11-26 NOTE — Telephone Encounter (Signed)
Medication Refill - Medication: clotrimazole-betamethasone (LOTRISONE) cream    Has the patient contacted their pharmacy? Yes.   (Agent: If no, request that the patient contact the pharmacy for the refill.) (Agent: If yes, when and what did the pharmacy advise?)  Preferred Pharmacy (with phone number or street name):  [id number] [place] Rough, [place] - [address] AT [facility] OF [address]  [address] [place] [address]  Phone: [phone] Fax: [phone]     Agent: Please be advised that RX refills may take up to 3 business days. We ask that you follow-up with your pharmacy.

## 2020-11-27 MED ORDER — CLOTRIMAZOLE-BETAMETHASONE 1-0.05 % EX CREA: TOPICAL_CREAM | CUTANEOUS | 1 refills | Status: DC

## 2020-11-27 NOTE — Telephone Encounter (Signed)
Requested medication (s) are due for refill today: Yes  Requested medication (s) are on the active medication list: Yes  Last refill:  05/02/20  Future visit scheduled: Yes  Notes to clinic:  See request.    Requested Prescriptions  Pending Prescriptions Disp Refills   clotrimazole-betamethasone (LOTRISONE) cream 60 g 1    Sig: APPLY EXTERNALLY TO THE AFFECTED AREA TWICE DAILY AS NEEDED      Off-Protocol Failed - 11/27/2020  8:28 AM      Failed - Medication not assigned to a protocol, review manually.      Passed - Valid encounter within last 12 months    Recent Outpatient Visits           1 month ago Essential hypertension   [facility] [place], Marzella Schlein, [place]   6 months ago Essential hypertension   [facility] [place], Marzella Schlein, [place]   1 year ago Pedal edema   [facility] [place], Marzella Schlein, [place]   1 year ago Essential hypertension   [facility], Alessandra Bevels, [place]   1 year ago Essential hypertension   [facility] [name], Marzella Schlein, [place]       Future Appointments             In 4 months [name], Marzella Schlein, MD [facility]

## 2020-12-11 DIAGNOSIS — H353122 Nonexudative age-related macular degeneration, left eye, intermediate dry stage: Secondary | ICD-10-CM | POA: Diagnosis not present

## 2020-12-11 DIAGNOSIS — H25813 Combined forms of age-related cataract, bilateral: Secondary | ICD-10-CM | POA: Diagnosis not present

## 2020-12-11 DIAGNOSIS — H353212 Exudative age-related macular degeneration, right eye, with inactive choroidal neovascularization: Secondary | ICD-10-CM | POA: Diagnosis not present

## 2020-12-22 ENCOUNTER — Encounter: Payer: Self-pay | Admitting: Family Medicine

## 2020-12-22 ENCOUNTER — Ambulatory Visit (INDEPENDENT_AMBULATORY_CARE_PROVIDER_SITE_OTHER): Payer: Medicare Other | Admitting: Family Medicine

## 2020-12-22 DIAGNOSIS — J069 Acute upper respiratory infection, unspecified: Secondary | ICD-10-CM

## 2020-12-22 DIAGNOSIS — J411 Mucopurulent chronic bronchitis: Secondary | ICD-10-CM | POA: Diagnosis not present

## 2020-12-22 MED ORDER — QVAR REDIHALER 80 MCG/ACT IN AERB: 2.0000 | INHALATION_SPRAY | Freq: Two times a day (BID) | RESPIRATORY_TRACT | 5 refills | Status: DC

## 2020-12-22 MED ORDER — ALBUTEROL SULFATE HFA 108 (90 BASE) MCG/ACT IN AERS: 2.0000 | INHALATION_SPRAY | Freq: Four times a day (QID) | RESPIRATORY_TRACT | 2 refills | Status: DC | PRN

## 2020-12-22 NOTE — Progress Notes (Signed)
Virtual telephone visit    Virtual Visit via Telephone Note   This visit type was conducted due to national recommendations for restrictions regarding the COVID-19 Pandemic (e.g. social distancing) in an effort to limit this patient's exposure and mitigate transmission in our community. Due to her co-morbid illnesses, this patient is at least at moderate risk for complications without adequate follow up. This format is felt to be most appropriate for this patient at this time. The patient did not have access to video technology or had technical difficulties with video requiring transitioning to audio format only (telephone). Physical exam was limited to content and character of the telephone converstion.     Patient location: home Provider location: home office  Persons involved in the visit: patient, provider  I discussed the limitations of evaluation and management by telemedicine and the availability of in person appointments. The patient expressed understanding and agreed to proceed.   Visit Date: 12/22/2020  Today's healthcare provider: Lavon Paganini, MD   Chief Complaint  Patient presents with  . URI   Subjective    HPI HPI    URI    Associated symptoms inlclude congestion, chills, cough, headache, rhinorrhea, shortness of breath, sore throat and wheezing.  Recent episode started 1 to 4 weeks ago.  The problem has been waxing and waning since onset.  Patient is not a smoker.          Comments    Patient reports cough is worse at night. Patient reports she has not taken any medications for cough.        Last edited by Dorian Pod, CMA on 12/22/2020  8:38 AM. (History)     Waxing and waning course over the last 2-3 weeks.  She has COVID test scheduled this afternoon.  Headaches are unusual for her.  Patient Active Problem List   Diagnosis Date Noted  . Chronic instability of right knee 06/24/2020  . Senile purpura ([place]) 05/06/2020  . Chronic pain  of right knee 05/05/2020  . Nausea 05/05/2020  . Chronic back pain 07/09/2018  . Constipation 01/12/2018  . RLS (restless legs syndrome) 08/29/2017  . Pedal edema 08/29/2017  . Essential hypertension   . Primary osteoarthritis of right knee   . Gastroesophageal reflux disease without esophagitis   . Cataract   . Macular degeneration   . Allergic rhinitis   . Hypertensive heart disease without heart failure 07/18/2017  . Snoring 07/18/2017  . Prediabetes 07/05/2017  . Colon polyps 01/12/2017  . Diverticulosis of large intestine without hemorrhage 01/12/2017  . External hemorrhoids 01/12/2017  . Gastric nodule 01/12/2017  . Hiatal hernia 01/12/2017  . Internal hemorrhoids 01/12/2017  . Lipoma of colon 01/12/2017  . Multiple gastric polyps 01/12/2017  . Schatzki's ring of distal esophagus 01/12/2017  . Bloating 12/23/2016  . Dysphagia 12/23/2016  . Epigastric pain 12/23/2016  . Heartburn 12/23/2016  . History of colonic polyps 12/23/2016  . Rectal bleeding 12/23/2016  . Chronic renal impairment, stage 3 (moderate) ([place]) 04/02/2016  . Age-related nuclear cataract of both eyes 10/31/2015  . Exudative age-related macular degeneration of right eye ([place]) 10/31/2015  . Nuclear sclerotic cataract of both eyes 10/31/2015  . Nonexudative age-related macular degeneration, left eye, advanced atrophic with subfoveal involvement 10/31/2015  . Bronchitis, mucopurulent recurrent ([place]) 03/31/2015  . Age-related macular degeneration, dry, left eye 02/18/2015  . Age-related macular degeneration, wet, right eye ([place]) 02/18/2015  . Morbid obesity ([place]) 05/23/2014  . Diastolic dysfunction 66/44/0347  .  Intertriginous candidiasis 06/16/2013  . Mixed hyperlipidemia 10/19/2012  . PA (pernicious anemia) 10/19/2012  . Vitamin D deficiency 10/19/2012   Social History   Tobacco Use  . Smoking status: Former Smoker    Packs/day: 1.00    Years: 10.00    Pack years: 10.00    Types: Cigarettes     Quit date: 11/28/1976    Years since quitting: 44.0  . Smokeless tobacco: Never Used  Vaping Use  . Vaping Use: Never used  Substance Use Topics  . Alcohol use: Not Currently    Comment: rarely, at holidays  . Drug use: No   Allergies  Allergen Reactions  . Erythromycin Other (See Comments)    Other reaction(s): Headache Headache and GI upset Headache and GI upset  . Losartan Nausea And Vomiting  . Metoprolol Nausea And Vomiting  . Simvastatin Other (See Comments)    Other reaction(s): Muscle Pain Pain lower extremities Pain lower extremities  . Dexlansoprazole Diarrhea  . Lisinopril Cough  . Prednisone     Other reaction(s): Constipation GI upset      Medications: Outpatient Medications Prior to Visit  Medication Sig  . aspirin 325 MG tablet Take 650 mg by mouth at bedtime.  Marland Kitchen azelastine (ASTELIN) 0.1 % nasal spray Place 2 sprays into both nostrils daily. (Patient taking differently: Place 2 sprays into both nostrils daily. As needed)  . Cholecalciferol (VITAMIN D3) 5000 units CAPS Take 1 capsule by mouth daily.  Marland Kitchen EPINEPHrine 0.3 mg/0.3 mL IJ SOAJ injection as needed.   Marland Kitchen esomeprazole (NEXIUM) 20 MG capsule Take 40 mg by mouth daily at 12 noon.   . ezetimibe (ZETIA) 10 MG tablet Take 1 tablet (10 mg total) by mouth daily.  . hydrALAZINE (APRESOLINE) 25 MG tablet TAKE 1 TABLET(25 MG) BY MOUTH THREE TIMES DAILY (Patient taking differently: Take 25 mg by mouth 2 (two) times daily.)  . ipratropium (ATROVENT) 0.03 % nasal spray Place 2 sprays into both nostrils every 8 (eight) hours as needed.  . montelukast (SINGULAIR) 10 MG tablet TAKE 1 TABLET(10 MG) BY MOUTH DAILY  . Multiple Vitamins-Minerals (EYE VITAMINS PO) Take 2 tablets by mouth daily.   . Omega-3 Fatty Acids (FISH OIL) 1000 MG CAPS Take 1 capsule by mouth daily.  . pramipexole (MIRAPEX) 0.5 MG tablet Take 1 tablet (0.5 mg total) by mouth at bedtime.  Marland Kitchen PREVIDENT 5000 DRY MOUTH 1.1 % GEL dental gel Place 1  application onto teeth. 2-3xs daily  . triamcinolone (NASACORT) 55 MCG/ACT AERO nasal inhaler Place 2 sprays into the nose daily.  Marland Kitchen triamcinolone ointment (KENALOG) 0.5 % Apply 1 application topically 2 (two) times daily.  Marland Kitchen triamterene-hydrochlorothiazide (MAXZIDE-25) 37.5-25 MG tablet TAKE 1 TABLET BY MOUTH DAILY  . [DISCONTINUED] beclomethasone (QVAR REDIHALER) 80 MCG/ACT inhaler Inhale 2 puffs into the lungs 2 (two) times daily.   . chlorhexidine (PERIDEX) 0.12 % solution RINSE 15 ML BY MOUTH FOR 30 SECONDS THREE TIMES DAILY (Patient not taking: Reported on 12/22/2020)  . clotrimazole-betamethasone (LOTRISONE) cream APPLY EXTERNALLY TO THE AFFECTED AREA TWICE DAILY AS NEEDED (Patient not taking: Reported on 12/22/2020)  . [DISCONTINUED] levocetirizine (XYZAL) 5 MG tablet Take 5 mg by mouth every evening. (Patient not taking: Reported on 12/22/2020)   No facility-administered medications prior to visit.    Review of Systems  Constitutional: Positive for chills.  HENT: Positive for congestion, postnasal drip, rhinorrhea and sore throat. Negative for ear pain.   Respiratory: Positive for cough, chest tightness, shortness of breath and  wheezing.   Cardiovascular: Negative for chest pain and palpitations.  Gastrointestinal: Positive for abdominal pain, diarrhea and nausea.  Neurological: Positive for headaches.    Last CBC Lab Results  Component Value Date   WBC 8.7 12/25/2018   HGB 12.7 12/25/2018   HCT 37.7 12/25/2018   MCV 86 12/25/2018   MCH 29.1 12/25/2018   RDW 12.3 12/25/2018   PLT 270 0000000   Last metabolic panel Lab Results  Component Value Date   GLUCOSE 98 09/29/2020   NA 136 09/29/2020   K 3.9 09/29/2020   CL 98 09/29/2020   CO2 24 09/29/2020   BUN 23 09/29/2020   CREATININE 0.97 09/29/2020   GFRNONAA 56 (L) 09/29/2020   GFRAA 64 09/29/2020   CALCIUM 9.5 09/29/2020   PROT 6.5 05/05/2020   ALBUMIN 4.2 05/05/2020   LABGLOB 2.3 05/05/2020   AGRATIO 1.8  05/05/2020   BILITOT 0.3 05/05/2020   ALKPHOS 112 05/05/2020   AST 16 05/05/2020   ALT 9 05/05/2020   ANIONGAP 8 07/10/2018      Objective    There were no vitals taken for this visit. BP Readings from Last 3 Encounters:  09/29/20 (!) 142/70  06/24/20 (!) 179/77  05/05/20 (!) 144/77   Wt Readings from Last 3 Encounters:  09/29/20 244 lb (110.7 kg)  06/24/20 (!) 248 lb (112.5 kg)  05/05/20 254 lb (115.2 kg)     Speaking in full sentences with no resp distress   Assessment & Plan     1. Viral URI with cough 2. Bronchitis, mucopurulent recurrent (HCC) - symptoms c/w viral URI  - no evidence of strep pharyngitis, CAP, AOM, bacterial sinusitis, or other bacterial infection - concern for possible COVID19 infection - outpatient testing scheduled for today - discussed need to quarantine 10 days from start of symptoms (or possibly 5 days with new CDC recommendations) and until fever-free for at least 24 hours - discussed symptomatic management, natural course, and return precautions  - does have h/o recurrent bronchitis, so will continue QVar and use albuterol prn - no indication for imaging at this time     Return if symptoms worsen or fail to improve.    I discussed the assessment and treatment plan with the patient. The patient was provided an opportunity to ask questions and all were answered. The patient agreed with the plan and demonstrated an understanding of the instructions.   The patient was advised to call back or seek an in-person evaluation if the symptoms worsen or if the condition fails to improve as anticipated.  I provided 13 minutes of non-face-to-face time during this encounter.  I, Lavon Paganini, MD, have reviewed all documentation for this visit. The documentation on 12/22/20 for the exam, diagnosis, procedures, and orders are all accurate and complete.   [name], Dionne Bucy, MD, MPH [facility]

## 2020-12-30 ENCOUNTER — Telehealth: Payer: Self-pay

## 2020-12-30 DIAGNOSIS — I10 Essential (primary) hypertension: Secondary | ICD-10-CM

## 2020-12-30 MED ORDER — TRIAMTERENE-HCTZ 37.5-25 MG PO TABS: 1.0000 | ORAL_TABLET | Freq: Every day | ORAL | 1 refills | Status: DC

## 2020-12-30 NOTE — Telephone Encounter (Signed)
[facility] faxed refill request for the following medications:  triamterene-hydrochlorothiazide (MAXZIDE-25) 37.5-25 MG tablet  Please advise. Thanks, [employer]

## 2021-01-22 ENCOUNTER — Telehealth: Payer: Self-pay

## 2021-01-22 ENCOUNTER — Telehealth: Payer: Self-pay | Admitting: Family Medicine

## 2021-01-22 NOTE — Telephone Encounter (Signed)
Copied from [place] [id number]. Topic: Quick Communication - See Telephone Encounter >> Jan 22, 2021 11:18 AM L[place] wrote: CRM for notification. See Telephone encounter for: 01/22/21. [facility] is reaching out for Dr B . Kenney Houseman,  a PA , personal cell  [phone] is calling re this pt  She is scheduled to have a cataract surgery next week and they need to consult before procedure.   Availability for Dr B to contact 12-1:30  today or after 5:30 today or tomorrow before 8. Am or 12-1- after 5:00 pm .

## 2021-01-22 NOTE — Telephone Encounter (Signed)
See other phone note. Needs pre-op visit.  Please put in any 20 min slot available on my schedule when patient calls to schedule.

## 2021-01-22 NOTE — Telephone Encounter (Signed)
  Primary Information  Source Subject Topic  Saamiya, Jeppsen D "Gloria Mercer" (Patient) [name] "Gloria Mercer" (Patient) Quick Communication - See Telephone Encounter  Summary: FU with PA at [facility] with Dr B re upcoming procedure must consult  CRM for notification. See Telephone encounter for: 01/22/21. [facility] is reaching out for Dr B . Kenney Houseman, a PA , personal cell [phone] is calling re this pt She is scheduled to have a cataract surgery next week and they need to consult before procedure.  Availability for Dr B to contact 12-1:30 today or after 5:30 today or tomorrow before 8. Am or 12-1- after 5:00 pm .

## 2021-01-22 NOTE — Telephone Encounter (Signed)
Spoke to [place] PA.  Patient scheduled for cataract surgery 01/28/21.  She reports inability to lay flat without feeling smothered, elevated BP, inability to take hydralazine TID, and chronic LE edema that is worsening.  Kenney Houseman will also speak to Anesthesiologist and Optho, but I believe she needs pre-op visit to discuss these symptoms and likely get EKG and Echo.  This would likely mean delaying her surgery.  Patient will call us back to schedule this after talking to [facility]. Currently I have availability next week that we could work her into.

## 2021-01-23 ENCOUNTER — Other Ambulatory Visit: Payer: Self-pay

## 2021-01-23 MED ORDER — PRAMIPEXOLE DIHYDROCHLORIDE 0.5 MG PO TABS
0.5000 mg | ORAL_TABLET | Freq: Every day | ORAL | 3 refills | Status: DC
Start: 2021-01-23 — End: 2021-12-21

## 2021-01-23 NOTE — Telephone Encounter (Signed)
[facility] faxed refill request for the following medications:  pramipexole (MIRAPEX) 0.5 MG tablet   Please advise.

## 2021-01-27 ENCOUNTER — Ambulatory Visit (INDEPENDENT_AMBULATORY_CARE_PROVIDER_SITE_OTHER): Payer: Medicare Other | Admitting: Family Medicine

## 2021-01-27 ENCOUNTER — Encounter: Payer: Self-pay | Admitting: Family Medicine

## 2021-01-27 ENCOUNTER — Other Ambulatory Visit: Payer: Self-pay

## 2021-01-27 VITALS — BP 177/85 | HR 77 | Temp 98.1°F | Resp 16 | Ht 60.0 in | Wt 240.7 lb

## 2021-01-27 DIAGNOSIS — Z79899 Other long term (current) drug therapy: Secondary | ICD-10-CM | POA: Diagnosis not present

## 2021-01-27 DIAGNOSIS — R600 Localized edema: Secondary | ICD-10-CM

## 2021-01-27 DIAGNOSIS — M1711 Unilateral primary osteoarthritis, right knee: Secondary | ICD-10-CM

## 2021-01-27 DIAGNOSIS — R0601 Orthopnea: Secondary | ICD-10-CM | POA: Diagnosis not present

## 2021-01-27 DIAGNOSIS — I10 Essential (primary) hypertension: Secondary | ICD-10-CM

## 2021-01-27 DIAGNOSIS — R9431 Abnormal electrocardiogram [ECG] [EKG]: Secondary | ICD-10-CM | POA: Diagnosis not present

## 2021-01-27 MED ORDER — TRAMADOL HCL 50 MG PO TABS: 50.0000 mg | ORAL_TABLET | Freq: Three times a day (TID) | ORAL | 1 refills | Status: DC | PRN

## 2021-01-27 MED ORDER — POTASSIUM CHLORIDE CRYS ER 20 MEQ PO TBCR: 20.0000 meq | EXTENDED_RELEASE_TABLET | Freq: Every day | ORAL | 3 refills | Status: DC

## 2021-01-27 MED ORDER — FUROSEMIDE 20 MG PO TABS: 20.0000 mg | ORAL_TABLET | Freq: Every day | ORAL | 3 refills | Status: DC

## 2021-01-27 MED ORDER — TRIAMCINOLONE ACETONIDE 0.5 % EX OINT
1.0000 | TOPICAL_OINTMENT | Freq: Two times a day (BID) | CUTANEOUS | 5 refills | Status: DC
Start: 2021-01-27 — End: 2022-07-02

## 2021-01-27 MED ORDER — CLOTRIMAZOLE-BETAMETHASONE 1-0.05 % EX CREA: TOPICAL_CREAM | CUTANEOUS | 1 refills | Status: DC

## 2021-01-27 NOTE — Progress Notes (Signed)
Established patient visit   Patient: Gloria Mercer   DOB: [birth year]   80 y.o. Female  MRN: [id number] Visit Date: 01/27/2021  Today's healthcare provider: Lavon Paganini, MD   Chief Complaint  Patient presents with  . Pre-op Exam   Subjective    HPI  Patient here today C/O elevated blood pressure readings. Patient was to have cataract surgery tomorrow, it has been canceled for now due to bp.   She also reports orthopnea (feels like she is suffocating when laying flat, sleeping at 45 degree angle) and LE edema.  He has occasional R sided sharp chest pain.   Patient Active Problem List   Diagnosis Date Noted  . Chronic instability of right knee 06/24/2020  . Senile purpura ([place]) 05/06/2020  . Chronic pain of right knee 05/05/2020  . Nausea 05/05/2020  . Chronic back pain 07/09/2018  . Constipation 01/12/2018  . RLS (restless legs syndrome) 08/29/2017  . Pedal edema 08/29/2017  . Essential hypertension   . Primary osteoarthritis of right knee   . Gastroesophageal reflux disease without esophagitis   . Cataract   . Macular degeneration   . Allergic rhinitis   . Hypertensive heart disease without heart failure 07/18/2017  . Snoring 07/18/2017  . Prediabetes 07/05/2017  . Colon polyps 01/12/2017  . Diverticulosis of large intestine without hemorrhage 01/12/2017  . External hemorrhoids 01/12/2017  . Gastric nodule 01/12/2017  . Hiatal hernia 01/12/2017  . Internal hemorrhoids 01/12/2017  . Lipoma of colon 01/12/2017  . Multiple gastric polyps 01/12/2017  . Schatzki's ring of distal esophagus 01/12/2017  . Bloating 12/23/2016  . Dysphagia 12/23/2016  . Epigastric pain 12/23/2016  . Heartburn 12/23/2016  . History of colonic polyps 12/23/2016  . Rectal bleeding 12/23/2016  . Chronic renal impairment, stage 3 (moderate) ([place]) 04/02/2016  . Age-related nuclear cataract of both eyes 10/31/2015  . Exudative age-related macular degeneration of right eye ([place])  10/31/2015  . Nuclear sclerotic cataract of both eyes 10/31/2015  . Nonexudative age-related macular degeneration, left eye, advanced atrophic with subfoveal involvement 10/31/2015  . Bronchitis, mucopurulent recurrent ([place]) 03/31/2015  . Age-related macular degeneration, dry, left eye 02/18/2015  . Age-related macular degeneration, wet, right eye ([place]) 02/18/2015  . Morbid obesity ([place]) 05/23/2014  . Diastolic dysfunction 27/01/5008  . Intertriginous candidiasis 06/16/2013  . Mixed hyperlipidemia 10/19/2012  . PA (pernicious anemia) 10/19/2012  . Vitamin D deficiency 10/19/2012   Past Surgical History:  Procedure Laterality Date  . CHOLECYSTECTOMY  2002  . TUBAL LIGATION  1978   Social History   Tobacco Use  . Smoking status: Former Smoker    Packs/day: 1.00    Years: 10.00    Pack years: 10.00    Types: Cigarettes    Quit date: 11/28/1976    Years since quitting: 44.1  . Smokeless tobacco: Never Used  Vaping Use  . Vaping Use: Never used  Substance Use Topics  . Alcohol use: Not Currently    Comment: rarely, at holidays  . Drug use: No   Family History  Problem Relation Age of Onset  . Hypertension Mother   . Seizures Mother   . Heart disease Father 56       several heart attacks  . Diabetes Father        diet controlled  . Myelodysplastic syndrome Sister   . Prostate cancer Paternal Grandfather   . Breast cancer Neg Hx   . Cervical cancer Neg Hx    Allergies  Allergen Reactions  . Erythromycin Other (See Comments)    Other reaction(s): Headache Headache and GI upset Headache and GI upset  . Losartan Nausea And Vomiting  . Metoprolol Nausea And Vomiting  . Simvastatin Other (See Comments)    Other reaction(s): Muscle Pain Pain lower extremities Pain lower extremities  . Dexlansoprazole Diarrhea  . Lisinopril Cough  . Prednisone     Other reaction(s): Constipation GI upset       Medications: Outpatient Medications Prior to Visit  Medication Sig   . aspirin 325 MG tablet Take 650 mg by mouth at bedtime.  Marland Kitchen azelastine (ASTELIN) 0.1 % nasal spray Place 2 sprays into both nostrils daily. (Patient taking differently: Place 2 sprays into both nostrils daily. As needed)  . beclomethasone (QVAR REDIHALER) 80 MCG/ACT inhaler Inhale 2 puffs into the lungs 2 (two) times daily.  . Cholecalciferol (VITAMIN D3) 5000 units CAPS Take 1 capsule by mouth daily.  Marland Kitchen EPINEPHrine 0.3 mg/0.3 mL IJ SOAJ injection as needed.   Marland Kitchen esomeprazole (NEXIUM) 20 MG capsule Take 40 mg by mouth daily at 12 noon.   . ezetimibe (ZETIA) 10 MG tablet Take 1 tablet (10 mg total) by mouth daily.  . hydrALAZINE (APRESOLINE) 25 MG tablet TAKE 1 TABLET(25 MG) BY MOUTH THREE TIMES DAILY (Patient taking differently: Take 25 mg by mouth 2 (two) times daily.)  . ipratropium (ATROVENT) 0.03 % nasal spray Place 2 sprays into both nostrils every 8 (eight) hours as needed.  Marland Kitchen levocetirizine (XYZAL) 5 MG tablet Take 5 mg by mouth every evening.  . montelukast (SINGULAIR) 10 MG tablet TAKE 1 TABLET(10 MG) BY MOUTH DAILY  . Multiple Vitamins-Minerals (EYE VITAMINS PO) Take 2 tablets by mouth daily.   . Omega-3 Fatty Acids (FISH OIL) 1000 MG CAPS Take 1 capsule by mouth daily.  . pramipexole (MIRAPEX) 0.5 MG tablet Take 1 tablet (0.5 mg total) by mouth at bedtime.  Marland Kitchen PREVIDENT 5000 DRY MOUTH 1.1 % GEL dental gel Place 1 application onto teeth. 2-3xs daily  . triamcinolone (NASACORT) 55 MCG/ACT AERO nasal inhaler Place 2 sprays into the nose daily.  Marland Kitchen triamterene-hydrochlorothiazide (MAXZIDE-25) 37.5-25 MG tablet Take 1 tablet by mouth daily.  . [DISCONTINUED] clotrimazole-betamethasone (LOTRISONE) cream APPLY EXTERNALLY TO THE AFFECTED AREA TWICE DAILY AS NEEDED  . [DISCONTINUED] triamcinolone ointment (KENALOG) 0.5 % Apply 1 application topically 2 (two) times daily.  . [DISCONTINUED] albuterol (VENTOLIN HFA) 108 (90 Base) MCG/ACT inhaler Inhale 2 puffs into the lungs every 6 (six) hours as  needed for wheezing or shortness of breath.  . [DISCONTINUED] chlorhexidine (PERIDEX) 0.12 % solution RINSE 15 ML BY MOUTH FOR 30 SECONDS THREE TIMES DAILY (Patient not taking: Reported on 12/22/2020)   No facility-administered medications prior to visit.    Review of Systems  Constitutional: Negative.   HENT: Negative.   Eyes: Negative.   Respiratory: Negative.   Cardiovascular: Negative.   Gastrointestinal: Negative.   Endocrine: Negative.   Genitourinary: Negative.   Musculoskeletal: Negative.   Skin: Negative.   Allergic/Immunologic: Negative.   Neurological: Negative.   Hematological: Negative.   Psychiatric/Behavioral: Negative.     Last CBC Lab Results  Component Value Date   WBC 8.7 12/25/2018   HGB 12.7 12/25/2018   HCT 37.7 12/25/2018   MCV 86 12/25/2018   MCH 29.1 12/25/2018   RDW 12.3 12/25/2018   PLT 270 48/54/6270   Last metabolic panel Lab Results  Component Value Date   GLUCOSE 98 09/29/2020   NA 136 09/29/2020  K 3.9 09/29/2020   CL 98 09/29/2020   CO2 24 09/29/2020   BUN 23 09/29/2020   CREATININE 0.97 09/29/2020   GFRNONAA 56 (L) 09/29/2020   GFRAA 64 09/29/2020   CALCIUM 9.5 09/29/2020   PROT 6.5 05/05/2020   ALBUMIN 4.2 05/05/2020   LABGLOB 2.3 05/05/2020   AGRATIO 1.8 05/05/2020   BILITOT 0.3 05/05/2020   ALKPHOS 112 05/05/2020   AST 16 05/05/2020   ALT 9 05/05/2020   ANIONGAP 8 07/10/2018   Last lipids Lab Results  Component Value Date   CHOL 228 (H) 05/05/2020   HDL 81 05/05/2020   LDLCALC 125 (H) 05/05/2020   TRIG 125 05/05/2020   CHOLHDL 3.1 06/22/2018   Last hemoglobin A1c Lab Results  Component Value Date   HGBA1C 5.6 09/29/2020       Objective    BP (!) 177/85 (BP Location: Right Arm, Patient Position: Sitting, Cuff Size: Large)   Pulse 77   Temp 98.1 F (36.7 C) (Oral)   Resp 16   Ht 5' (1.524 m)   Wt 240 lb 11.2 oz (109.2 kg)   SpO2 97%   BMI 47.01 kg/m  BP Readings from Last 3 Encounters:  01/27/21  (!) 177/85  09/29/20 (!) 142/70  06/24/20 (!) 179/77   Wt Readings from Last 3 Encounters:  01/27/21 240 lb 11.2 oz (109.2 kg)  09/29/20 244 lb (110.7 kg)  06/24/20 (!) 248 lb (112.5 kg)       Physical Exam Constitutional:      General: She is not in acute distress.    Appearance: Normal appearance.  HENT:     Head: Normocephalic.  Eyes:     Conjunctiva/sclera: Conjunctivae normal.  Cardiovascular:     Rate and Rhythm: Normal rate and regular rhythm.     Heart sounds: Gallop present.   Pulmonary:     Effort: Pulmonary effort is normal. No respiratory distress.     Breath sounds: Rales (bilateral bases) present. No wheezing or rhonchi.  Musculoskeletal:     Cervical back: Neck supple.     Right lower leg: Edema (2+) present.     Left lower leg: Edema (2+) present.  Lymphadenopathy:     Cervical: No cervical adenopathy.  Skin:    Findings: No rash.  Neurological:     Mental Status: She is alert and oriented to person, place, and time.       No results found for any visits on 01/27/21.  Assessment & Plan     1. Essential hypertension - elevated today without acute symptoms to suggest hypertensive urgency - will add lasix as below and continue Maxzide and Hydralazine - she lists intolerance to beta blocker, so will hold on that for now - EKG 12-Lead - Ambulatory referral to Cardiology - Basic Metabolic Panel (BMET)  2. Orthopnea 3. Edema, lower extremity 4. EKG abnormality - patient with atypical chest pain (sharp, R sided, nonexertional), but does have new axis change on EKG - also with HF symptoms (Orthopnea, Edema) - will get BNP and Echo - referral to cardiology - check CBC, CMP, TSH for other possible underlying causes - start Lasix 20mg  daily and KDur - recheck K in 1 week - hold on surgery until cardiac eval - ECHOCARDIOGRAM COMPLETE; Future - Ambulatory referral to Cardiology - B Nat Peptide - TSH - CBC   5. Long-term use of high-risk  medication - advised to stop taking 650 mg ASA daily due to risk of GI bleed -  CBC  6. Primary osteoarthritis of right knee - longstanding - not surgical candidate for knee replacement - avoid NSAIDs due to AKI in the past - trial of Tramadol for pain control    Return in about 4 weeks (around 02/24/2021) for chronic disease f/u or be seen by cardiology.      Total time spent on today's visit was greater than 40 minutes, including both face-to-face time and nonface-to-face time personally spent on review of chart (labs and imaging), discussing labs and goals, discussing further work-up, treatment options, referrals to specialist if needed, reviewing outside records of pertinent, answering patient's questions, and coordinating care.   I, Lavon Paganini, MD, have reviewed all documentation for this visit. The documentation on 01/27/21 for the exam, diagnosis, procedures, and orders are all accurate and complete.   Judaea Burgoon, Dionne Bucy, MD, MPH [facility]

## 2021-01-27 NOTE — Patient Instructions (Signed)

## 2021-01-28 LAB — BASIC METABOLIC PANEL
BUN/Creatinine Ratio: 17 (ref 12–28)
BUN: 17 mg/dL (ref 8–27)
CO2: 22 mmol/L (ref 20–29)
Calcium: 9.8 mg/dL (ref 8.7–10.3)
Chloride: 98 mmol/L (ref 96–106)
Creatinine, Ser: 0.98 mg/dL (ref 0.57–1.00)
Glucose: 112 mg/dL — ABNORMAL HIGH (ref 65–99)
Potassium: 4.2 mmol/L (ref 3.5–5.2)
Sodium: 137 mmol/L (ref 134–144)
eGFR: 58 mL/min/{1.73_m2} — ABNORMAL LOW (ref >59)

## 2021-01-28 LAB — CBC
Hematocrit: 40.9 % (ref 34.0–46.6)
Hemoglobin: 13.7 g/dL (ref 11.1–15.9)
MCH: 29.7 pg (ref 26.6–33.0)
MCHC: 33.5 g/dL (ref 31.5–35.7)
MCV: 89 fL (ref 79–97)
Platelets: 274 10*3/uL (ref 150–450)
RBC: 4.61 x10E6/uL (ref 3.77–5.28)
RDW: 13.2 % (ref 11.7–15.4)
WBC: 9.7 10*3/uL (ref 3.4–10.8)

## 2021-01-28 LAB — BRAIN NATRIURETIC PEPTIDE: BNP: 64.5 pg/mL (ref 0.0–100.0)

## 2021-01-28 LAB — TSH: TSH: 2.86 u[IU]/mL (ref 0.450–4.500)

## 2021-01-30 ENCOUNTER — Encounter: Payer: Self-pay | Admitting: Cardiology

## 2021-01-30 ENCOUNTER — Ambulatory Visit (INDEPENDENT_AMBULATORY_CARE_PROVIDER_SITE_OTHER): Payer: Medicare Other | Admitting: Cardiology

## 2021-01-30 ENCOUNTER — Other Ambulatory Visit: Payer: Self-pay

## 2021-01-30 VITALS — BP 150/80 | HR 75 | Ht 60.0 in | Wt 241.0 lb

## 2021-01-30 DIAGNOSIS — R0609 Other forms of dyspnea: Secondary | ICD-10-CM

## 2021-01-30 DIAGNOSIS — R400 Somnolence: Secondary | ICD-10-CM | POA: Diagnosis not present

## 2021-01-30 DIAGNOSIS — I10 Essential (primary) hypertension: Secondary | ICD-10-CM

## 2021-01-30 DIAGNOSIS — R0600 Dyspnea, unspecified: Secondary | ICD-10-CM

## 2021-01-30 MED ORDER — TORSEMIDE 20 MG PO TABS: 40.0000 mg | ORAL_TABLET | Freq: Every day | ORAL | 5 refills | Status: DC

## 2021-01-30 MED ORDER — IRBESARTAN 300 MG PO TABS: 300.0000 mg | ORAL_TABLET | Freq: Every day | ORAL | 5 refills | Status: DC

## 2021-01-30 MED ORDER — POTASSIUM CHLORIDE CRYS ER 20 MEQ PO TBCR: 20.0000 meq | EXTENDED_RELEASE_TABLET | Freq: Every day | ORAL | 3 refills | Status: DC

## 2021-01-30 NOTE — Patient Instructions (Addendum)
Medication Instructions:  Your physician has recommended you make the following change in your medication:   1.  STOP taking triamterene-hydrochlorothiazide ([id number]).  2.  STOP taking Lasix (Furosemide)  3.  START taking Torsemide (Demadex) 40 MG once a day.  4.  START taking Irbesartan (Avapro) 300 MG once a day.  *If you need a refill on your cardiac medications before your next appointment, please call your pharmacy*   Lab Work:  Your physician recommends that you return for lab work in: in 5 days  Wed. 02/04/21  - Please go to the [facility]. You will check in at the front desk to the right as you walk into the atrium. Valet Parking is offered if needed. - No appointment needed. You may go any day between 7 am and 6 pm.    Testing/Procedures:  1.  Your physician has requested that you have an echocardiogram. Echocardiography is a painless test that uses sound waves to create images of your heart. It provides your doctor with information about the size and shape of your heart and how well your heart's chambers and valves are working. This procedure takes approximately one hour. There are no restrictions for this procedure.    Follow-Up: At [facility], you and your health needs are our priority.  As part of our continuing mission to provide you with exceptional heart care, we have created designated Provider Care Teams.  These Care Teams include your primary Cardiologist (physician) and Advanced Practice Providers (APPs -  Physician Assistants and Nurse Practitioners) who all work together to provide you with the care you need, when you need it.  We recommend signing up for the patient portal called "MyChart".  Sign up information is provided on this After Visit Summary.  MyChart is used to connect with patients for Virtual Visits (Telemedicine).  Patients are able to view lab/test results, encounter notes, upcoming appointments, etc.  Non-urgent messages can be sent to  your provider as well.   To learn more about what you can do with MyChart, go to [email or web address].    Your next appointment:   Follow up after Echo   The format for your next appointment:   In Person  Provider:   Kate Sable, MD   Other Instructions

## 2021-01-30 NOTE — Progress Notes (Signed)
Cardiology Office Note:    Date:  01/30/2021   ID:  Gloria Mercer, DOB 1941-06-15, MRN [id number]  PCP:  Virginia Crews, MD   [place]  Cardiologist:  Kate Sable, MD  Advanced Practice Provider:  No care team member to display Electrophysiologist:  None       Referring MD: Virginia Crews, MD   Chief Complaint  Patient presents with  . New patient    Referred by PCP for HTN and Edema in legs. Meds reviewed verbally with patient.     History of Present Illness:    Gloria Mercer is a 80 y.o. female with a hx of hypertension presents due to elevated blood pressure and edema.  Patient has high blood pressure for years now, recently noticed elevations up to 973Z systolic, sometimes associated with headaches.  Also noticed lower extremity edema, followed up with primary care provider, Lasix 20 mg daily was started with minimal effect.  Was prescribed potassium, which she has not taken yet.  Patient also endorsed shortness of breath with exertion, rare chest discomfort.  She has daytime somnolence, fatigue, sleeps on an incline due to comfort, difficulty breathing and choking spells sometimes.  Past Medical History:  Diagnosis Date  . Allergic rhinitis    on allergy shots, sees Dr. Pryor Ochoa  . Cataract   . GERD (gastroesophageal reflux disease)   . Hypertension   . Macular degeneration    followed by Dr Garwin Brothers at [facility]  . Osteoarthritis   . Sciatica     Past Surgical History:  Procedure Laterality Date  . CHOLECYSTECTOMY  2002  . TUBAL LIGATION  1978    Current Medications: Current Meds  Medication Sig  . azelastine (ASTELIN) 0.1 % nasal spray Place 2 sprays into both nostrils daily.  . beclomethasone (QVAR REDIHALER) 80 MCG/ACT inhaler Inhale 2 puffs into the lungs 2 (two) times daily.  . Cholecalciferol (VITAMIN D3) 5000 units CAPS Take 1 capsule by mouth daily.  . clotrimazole-betamethasone (LOTRISONE) cream APPLY  EXTERNALLY TO THE AFFECTED AREA TWICE DAILY AS NEEDED  . EPINEPHrine 0.3 mg/0.3 mL IJ SOAJ injection as needed.   Marland Kitchen esomeprazole (NEXIUM) 20 MG capsule Take 40 mg by mouth daily at 12 noon.   . ezetimibe (ZETIA) 10 MG tablet Take 1 tablet (10 mg total) by mouth daily.  . hydrALAZINE (APRESOLINE) 25 MG tablet TAKE 1 TABLET(25 MG) BY MOUTH THREE TIMES DAILY  . ipratropium (ATROVENT) 0.03 % nasal spray Place 2 sprays into both nostrils every 8 (eight) hours as needed.  . irbesartan (AVAPRO) 300 MG tablet Take 1 tablet (300 mg total) by mouth daily.  Marland Kitchen levocetirizine (XYZAL) 5 MG tablet Take 5 mg by mouth every evening.  . montelukast (SINGULAIR) 10 MG tablet TAKE 1 TABLET(10 MG) BY MOUTH DAILY  . Multiple Vitamins-Minerals (EYE VITAMINS PO) Take 2 tablets by mouth daily.   . Omega-3 Fatty Acids (FISH OIL) 1000 MG CAPS Take 1 capsule by mouth daily.  . pramipexole (MIRAPEX) 0.5 MG tablet Take 1 tablet (0.5 mg total) by mouth at bedtime.  Marland Kitchen PREVIDENT 5000 DRY MOUTH 1.1 % GEL dental gel Place 1 application onto teeth. 2-3xs daily  . torsemide (DEMADEX) 20 MG tablet Take 2 tablets (40 mg total) by mouth daily.  Marland Kitchen triamcinolone (NASACORT) 55 MCG/ACT AERO nasal inhaler Place 2 sprays into the nose daily.  Marland Kitchen triamcinolone ointment (KENALOG) 0.5 % Apply 1 application topically 2 (two) times daily.  . [DISCONTINUED] furosemide (  LASIX) 20 MG tablet Take 1 tablet (20 mg total) by mouth daily.  . [DISCONTINUED] potassium chloride SA (KLOR-CON) 20 MEQ tablet Take 1 tablet (20 mEq total) by mouth daily.  . [DISCONTINUED] triamterene-hydrochlorothiazide (MAXZIDE-25) 37.5-25 MG tablet Take 1 tablet by mouth daily.     Allergies:   Erythromycin, Losartan, Metoprolol, Simvastatin, Dexlansoprazole, Lisinopril, and Prednisone   Social History   Socioeconomic History  . Marital status: Divorced    Spouse name: Not on file  . Number of children: 1  . Years of education: Not on file  . Highest education level:  Bachelor's degree (e.g., BA, AB, BS)  Occupational History  . Occupation: retired in 2001    Comment: social work Scientist, physiological  Tobacco Use  . Smoking status: Former Smoker    Packs/day: 1.00    Years: 10.00    Pack years: 10.00    Types: Cigarettes    Quit date: 11/28/1976    Years since quitting: 44.2  . Smokeless tobacco: Never Used  Vaping Use  . Vaping Use: Never used  Substance and Sexual Activity  . Alcohol use: Not Currently    Comment: rarely, at holidays  . Drug use: No  . Sexual activity: Not Currently  Other Topics Concern  . Not on file  Social History Narrative  . Not on file   Social Determinants of Health   Financial Resource Strain: Low Risk   . Difficulty of Paying Living Expenses: Not hard at all  Food Insecurity: No Food Insecurity  . Worried About Charity fundraiser in the Last Year: Never true  . Ran Out of Food in the Last Year: Never true  Transportation Needs: No Transportation Needs  . Lack of Transportation (Medical): No  . Lack of Transportation (Non-Medical): No  Physical Activity: Inactive  . Days of Exercise per Week: 0 days  . Minutes of Exercise per Session: 0 min  Stress: No Stress Concern Present  . Feeling of Stress : Not at all  Social Connections: Moderately Isolated  . Frequency of Communication with Friends and Family: More than three times a week  . Frequency of Social Gatherings with Friends and Family: More than three times a week  . Attends Religious Services: Never  . Active Member of Clubs or Organizations: Yes  . Attends Archivist Meetings: More than 4 times per year  . Marital Status: Divorced     Family History: The patient's family history includes Diabetes in her father; Heart disease (age of onset: 80) in her father; Hypertension in her mother; Myelodysplastic syndrome in her sister; Prostate cancer in her paternal grandfather; Seizures in her mother. There is no history of Breast cancer or Cervical  cancer.  ROS:   Please see the history of present illness.     All other systems reviewed and are negative.  EKGs/Labs/Other Studies Reviewed:    The following studies were reviewed today:   EKG:  EKG not ordered today.  EKG from PCPs office dated 01/27/2021 showed normal sinus rhythm, heart rate 77  Recent Labs: 05/05/2020: ALT 9 01/27/2021: BNP 64.5; BUN 17; Creatinine, Ser 0.98; Hemoglobin 13.7; Platelets 274; Potassium 4.2; Sodium 137; TSH 2.860  Recent Lipid Panel    Component Value Date/Time   CHOL 228 (H) 05/05/2020 1603   TRIG 125 05/05/2020 1603   HDL 81 05/05/2020 1603   CHOLHDL 3.1 06/22/2018 0840   LDLCALC 125 (H) 05/05/2020 1603     Risk Assessment/Calculations:  Physical Exam:    VS:  BP (!) 150/80 (BP Location: Right Arm, Patient Position: Sitting, Cuff Size: Large)   Pulse 75   Ht 5' (1.524 m)   Wt 241 lb (109.3 kg)   SpO2 97%   BMI 47.07 kg/m     Wt Readings from Last 3 Encounters:  01/30/21 241 lb (109.3 kg)  01/27/21 240 lb 11.2 oz (109.2 kg)  09/29/20 244 lb (110.7 kg)     GEN:  Well nourished, well developed in no acute distress HEENT: Normal NECK: Unable to assess JVD due to body habitus LYMPHATICS: No lymphadenopathy CARDIAC: RRR, no murmurs, rubs, gallops RESPIRATORY: Decreased breath sounds at bases ABDOMEN: Soft, non-tender, non-distended MUSCULOSKELETAL:  2+ edema; No deformity  SKIN: Warm and dry NEUROLOGIC:  Alert and oriented x 3 PSYCHIATRIC:  Normal affect   ASSESSMENT:    1. Dyspnea on exertion   2. Primary hypertension   3. Morbid obesity ([place])   4. Somnolence, daytime    PLAN:    In order of problems listed above:  1. Dyspnea on exertion, lower extremity edema.  Get echocardiogram to evaluate systolic and diastolic function.  Symptoms consistent with heart failure, likely diastolic in light of hypertension history and morbid obesity.  Stop Lasix, start torsemide 40 mg daily, start potassium 20 mEq daily.  Check BMP  in 5 days. 2. Hypertension, start irbesartan 300 mg daily, torsemide as above.  Continue hydralazine.  Stop Maxzide with starting torsemide 3. Morbid obesity, low-calorie diet, weight loss recommended. 4. Patient with daytime somnolence, fatigue.  Likely has OSA.  Refer to pulmonary medicine for sleep apnea evaluation and treatment if diagnosed.  Follow-up after echocardiogram     Medication Adjustments/Labs and Tests Ordered: Current medicines are reviewed at length with the patient today.  Concerns regarding medicines are outlined above.  Orders Placed This Encounter  Procedures  . Basic metabolic panel  . Ambulatory referral to Pulmonology  . ECHOCARDIOGRAM COMPLETE   Meds ordered this encounter  Medications  . potassium chloride SA (KLOR-CON) 20 MEQ tablet    Sig: Take 1 tablet (20 mEq total) by mouth daily.    Dispense:  30 tablet    Refill:  3  . torsemide (DEMADEX) 20 MG tablet    Sig: Take 2 tablets (40 mg total) by mouth daily.    Dispense:  60 tablet    Refill:  5  . irbesartan (AVAPRO) 300 MG tablet    Sig: Take 1 tablet (300 mg total) by mouth daily.    Dispense:  30 tablet    Refill:  5    Patient Instructions  Medication Instructions:  Your physician has recommended you make the following change in your medication:   1.  STOP taking triamterene-hydrochlorothiazide ([id number]).  2.  STOP taking Lasix (Furosemide)  3.  START taking Torsemide (Demadex) 40 MG once a day.  4.  START taking Irbesartan (Avapro) 300 MG once a day.  *If you need a refill on your cardiac medications before your next appointment, please call your pharmacy*   Lab Work:  Your physician recommends that you return for lab work in: in 5 days  Wed. 02/04/21  - Please go to the [facility]. You will check in at the front desk to the right as you walk into the atrium. Valet Parking is offered if needed. - No appointment needed. You may go any day between 7 am and 6  pm.    Testing/Procedures:  1.  Your  physician has requested that you have an echocardiogram. Echocardiography is a painless test that uses sound waves to create images of your heart. It provides your doctor with information about the size and shape of your heart and how well your heart's chambers and valves are working. This procedure takes approximately one hour. There are no restrictions for this procedure.    Follow-Up: At [facility], you and your health needs are our priority.  As part of our continuing mission to provide you with exceptional heart care, we have created designated Provider Care Teams.  These Care Teams include your primary Cardiologist (physician) and Advanced Practice Providers (APPs -  Physician Assistants and Nurse Practitioners) who all work together to provide you with the care you need, when you need it.  We recommend signing up for the patient portal called "MyChart".  Sign up information is provided on this After Visit Summary.  MyChart is used to connect with patients for Virtual Visits (Telemedicine).  Patients are able to view lab/test results, encounter notes, upcoming appointments, etc.  Non-urgent messages can be sent to your provider as well.   To learn more about what you can do with MyChart, go to [email or web address].    Your next appointment:   Follow up after Echo   The format for your next appointment:   In Person  Provider:   Kate Sable, MD   Other Instructions      Signed, Kate Sable, MD  01/30/2021 4:45 PM    [place]

## 2021-02-06 ENCOUNTER — Other Ambulatory Visit
Admission: RE | Admit: 2021-02-06 | Discharge: 2021-02-06 | Disposition: A | Discharge status: Home / Self Care | Payer: Medicare Other | Source: Ambulatory Visit | Attending: Cardiology | Admitting: Cardiology

## 2021-02-06 DIAGNOSIS — R0600 Dyspnea, unspecified: Secondary | ICD-10-CM | POA: Diagnosis not present

## 2021-02-06 DIAGNOSIS — I10 Essential (primary) hypertension: Secondary | ICD-10-CM | POA: Diagnosis not present

## 2021-02-06 DIAGNOSIS — R0609 Other forms of dyspnea: Secondary | ICD-10-CM

## 2021-02-06 LAB — BASIC METABOLIC PANEL
Anion gap: 10 (ref 5–15)
BUN: 31 mg/dL — ABNORMAL HIGH (ref 8–23)
CO2: 25 mmol/L (ref 22–32)
Calcium: 9.2 mg/dL (ref 8.9–10.3)
Chloride: 101 mmol/L (ref 98–111)
Creatinine, Ser: 1.21 mg/dL — ABNORMAL HIGH (ref 0.44–1.00)
GFR, Estimated: 45 mL/min — ABNORMAL LOW (ref >60)
Glucose, Bld: 141 mg/dL — ABNORMAL HIGH (ref 70–99)
Potassium: 4 mmol/L (ref 3.5–5.1)
Sodium: 136 mmol/L (ref 135–145)

## 2021-02-20 ENCOUNTER — Other Ambulatory Visit: Payer: Medicare Other

## 2021-02-25 ENCOUNTER — Telehealth: Payer: Self-pay | Admitting: Cardiology

## 2021-02-25 NOTE — Telephone Encounter (Signed)
Left voicemail message to call back for review of her previous call.

## 2021-02-25 NOTE — Telephone Encounter (Signed)
Spoke with patient and reviewed that those readings are low and to continue monitoring. She states that she was recently started on torsemide, potassium and irbesartan. Since taking these new medications she has been having persistent nausea in the morning and just not feeling well. Advised I would need to send to provider and his nurse for review and recommendations and that she will call her back once provider reviews. She verbalized understanding and states that she does have a hard time getting to the phone and approves leaving detailed voicemail message. Advised I made a note of this.

## 2021-02-25 NOTE — Telephone Encounter (Signed)
Pt c/o BP issue: STAT if pt c/o blurred vision, one-sided weakness or slurred speech  1. What are your last 5 BP readings? 113/46   117/56    93/36   91/46    2. Are you having any other symptoms (ex. Dizziness, headache, blurred vision, passed out)? Headaches for a while some days extreme fatigue   3. What is your BP issue?  Patient wants to know how low is too low for bp and what to do when this happens.    Patient will not be available to answer via phone today but is ok with leaving a detailed msg.

## 2021-02-26 NOTE — Telephone Encounter (Signed)
Spoke to patient and gave her the below recommendation. Patient verbalized understanding and agreed with plan.  Kate Sable, MD  You; Valora Corporal, RN 5 minutes ago (10:57 AM)   Please have patient stop hydralazine due to low blood pressures. Continue other medications as prescribed. Monitor blood pressure closely. Thank you   Routing comment

## 2021-03-09 ENCOUNTER — Ambulatory Visit: Payer: Medicare Other | Admitting: Cardiology

## 2021-03-18 ENCOUNTER — Ambulatory Visit (INDEPENDENT_AMBULATORY_CARE_PROVIDER_SITE_OTHER): Payer: Medicare Other

## 2021-03-18 ENCOUNTER — Other Ambulatory Visit: Payer: Self-pay

## 2021-03-18 DIAGNOSIS — R0609 Other forms of dyspnea: Secondary | ICD-10-CM

## 2021-03-18 DIAGNOSIS — R0600 Dyspnea, unspecified: Secondary | ICD-10-CM

## 2021-03-18 LAB — ECHOCARDIOGRAM COMPLETE
AR max vel: 2.94 cm2
AV Area VTI: 3.3 cm2
AV Area mean vel: 2.74 cm2
AV Mean grad: 3 mmHg
AV Peak grad: 7.7 mmHg
Ao pk vel: 1.39 m/s
Area-P 1/2: 3.13 cm2
S' Lateral: 3 cm

## 2021-03-18 MED ORDER — PERFLUTREN LIPID MICROSPHERE
1.0000 mL | INTRAVENOUS | Status: AC | PRN
  Administered 2021-03-18: 2 mL via INTRAVENOUS

## 2021-03-19 DIAGNOSIS — L821 Other seborrheic keratosis: Secondary | ICD-10-CM | POA: Diagnosis not present

## 2021-03-19 DIAGNOSIS — L853 Xerosis cutis: Secondary | ICD-10-CM | POA: Diagnosis not present

## 2021-03-19 DIAGNOSIS — D2271 Melanocytic nevi of right lower limb, including hip: Secondary | ICD-10-CM | POA: Diagnosis not present

## 2021-03-19 DIAGNOSIS — D692 Other nonthrombocytopenic purpura: Secondary | ICD-10-CM | POA: Diagnosis not present

## 2021-03-19 DIAGNOSIS — D225 Melanocytic nevi of trunk: Secondary | ICD-10-CM | POA: Diagnosis not present

## 2021-03-19 DIAGNOSIS — D2272 Melanocytic nevi of left lower limb, including hip: Secondary | ICD-10-CM | POA: Diagnosis not present

## 2021-03-19 DIAGNOSIS — D2261 Melanocytic nevi of right upper limb, including shoulder: Secondary | ICD-10-CM | POA: Diagnosis not present

## 2021-03-19 DIAGNOSIS — D2262 Melanocytic nevi of left upper limb, including shoulder: Secondary | ICD-10-CM | POA: Diagnosis not present

## 2021-03-23 ENCOUNTER — Ambulatory Visit (INDEPENDENT_AMBULATORY_CARE_PROVIDER_SITE_OTHER): Payer: Medicare Other | Admitting: Cardiology

## 2021-03-23 ENCOUNTER — Encounter: Payer: Self-pay | Admitting: Cardiology

## 2021-03-23 ENCOUNTER — Other Ambulatory Visit: Payer: Self-pay

## 2021-03-23 VITALS — BP 114/67 | HR 85 | Ht 60.0 in | Wt 241.0 lb

## 2021-03-23 DIAGNOSIS — I10 Essential (primary) hypertension: Secondary | ICD-10-CM | POA: Diagnosis not present

## 2021-03-23 DIAGNOSIS — R0600 Dyspnea, unspecified: Secondary | ICD-10-CM | POA: Diagnosis not present

## 2021-03-23 DIAGNOSIS — R0609 Other forms of dyspnea: Secondary | ICD-10-CM

## 2021-03-23 MED ORDER — IRBESARTAN 300 MG PO TABS: 150.0000 mg | ORAL_TABLET | Freq: Every day | ORAL | Status: DC

## 2021-03-23 MED ORDER — TORSEMIDE 20 MG PO TABS: 40.0000 mg | ORAL_TABLET | Freq: Every day | ORAL | 5 refills | Status: DC

## 2021-03-23 NOTE — Patient Instructions (Signed)
Medication Instructions:   1.  REDUCE your Irbesartan to 150 MG daily (0.5 pill)   *If you need a refill on your cardiac medications before your next appointment, please call your pharmacy*   Lab Work:  Your physician recommends that you return for lab work in: 2 weeks.  - Please go to the [facility]. You will check in at the front desk to the right as you walk into the atrium. Valet Parking is offered if needed. - No appointment needed. You may go any day between 7 am and 6 pm.   Testing/Procedures: None ordered   Follow-Up: At [facility], you and your health needs are our priority.  As part of our continuing mission to provide you with exceptional heart care, we have created designated Provider Care Teams.  These Care Teams include your primary Cardiologist (physician) and Advanced Practice Providers (APPs -  Physician Assistants and Nurse Practitioners) who all work together to provide you with the care you need, when you need it.  We recommend signing up for the patient portal called "MyChart".  Sign up information is provided on this After Visit Summary.  MyChart is used to connect with patients for Virtual Visits (Telemedicine).  Patients are able to view lab/test results, encounter notes, upcoming appointments, etc.  Non-urgent messages can be sent to your provider as well.   To learn more about what you can do with MyChart, go to [email or web address].    Your next appointment:   4-6 weeks   The format for your next appointment:   In Person  Provider:   Kate Sable, MD   Other Instructions

## 2021-03-23 NOTE — Progress Notes (Signed)
Cardiology Office Note:    Date:  03/23/2021   ID:  Gloria Mercer, DOB 1941-05-24, MRN [id number]  PCP:  Virginia Crews, MD   [place]  Cardiologist:  Kate Sable, MD  Advanced Practice Provider:  No care team member to display Electrophysiologist:  None       Referring MD: Virginia Crews, MD   Chief Complaint  Patient presents with  . Other    Follow up post ECHO - patient c.o SOB and swelling in legs. Meds reviewed verbally with patient.     History of Present Illness:    Gloria Mercer is a 80 y.o. female with a hx of hypertension presents for follow-up.  She was last seen due to elevated blood pressure, shortness of breath on exertion, daytime somnolence, fatigue and edema.   Echo was ordered to evaluate systolic and diastolic function, patient referred to pulmonary medicine for sleep apnea evaluation.  Presents for echo results, still with shortness of breath and swelling.  Was started on irbesartan for BP control.  Torsemide 40 mg daily also started.  She states her edema has overall improved, blood pressure is also controlled.  However, she feels tired more than usual since starting medications and also has some diarrhea.  Has appointment to see pulmonary medicine for sleep apnea eval later this week.   Past Medical History:  Diagnosis Date  . Allergic rhinitis    on allergy shots, sees Dr. Pryor Ochoa  . Cataract   . GERD (gastroesophageal reflux disease)   . Hypertension   . Macular degeneration    followed by Dr Garwin Brothers at [facility]  . Osteoarthritis   . Sciatica     Past Surgical History:  Procedure Laterality Date  . CHOLECYSTECTOMY  2002  . TUBAL LIGATION  1978    Current Medications: Current Meds  Medication Sig  . azelastine (ASTELIN) 0.1 % nasal spray Place 2 sprays into both nostrils daily.  . beclomethasone (QVAR REDIHALER) 80 MCG/ACT inhaler Inhale 2 puffs into the lungs 2 (two) times daily.  .  Cholecalciferol (VITAMIN D3) 5000 units CAPS Take 1 capsule by mouth daily.  . clotrimazole-betamethasone (LOTRISONE) cream APPLY EXTERNALLY TO THE AFFECTED AREA TWICE DAILY AS NEEDED  . EPINEPHrine 0.3 mg/0.3 mL IJ SOAJ injection as needed.   Marland Kitchen esomeprazole (NEXIUM) 20 MG capsule Take 40 mg by mouth daily at 12 noon.   . ezetimibe (ZETIA) 10 MG tablet Take 1 tablet (10 mg total) by mouth daily.  Marland Kitchen ipratropium (ATROVENT) 0.03 % nasal spray Place 2 sprays into both nostrils every 8 (eight) hours as needed.  Marland Kitchen levocetirizine (XYZAL) 5 MG tablet Take 5 mg by mouth every evening.  . montelukast (SINGULAIR) 10 MG tablet TAKE 1 TABLET(10 MG) BY MOUTH DAILY  . Multiple Vitamins-Minerals (EYE VITAMINS PO) Take 2 tablets by mouth daily.   . Omega-3 Fatty Acids (FISH OIL) 1000 MG CAPS Take 1 capsule by mouth daily.  . potassium chloride SA (KLOR-CON) 20 MEQ tablet Take 1 tablet (20 mEq total) by mouth daily.  . pramipexole (MIRAPEX) 0.5 MG tablet Take 1 tablet (0.5 mg total) by mouth at bedtime.  Marland Kitchen PREVIDENT 5000 DRY MOUTH 1.1 % GEL dental gel Place 1 application onto teeth. 2-3xs daily  . triamcinolone (NASACORT) 55 MCG/ACT AERO nasal inhaler Place 2 sprays into the nose daily.  Marland Kitchen triamcinolone ointment (KENALOG) 0.5 % Apply 1 application topically 2 (two) times daily.  . [DISCONTINUED] irbesartan (AVAPRO) 300 MG tablet  Take 1 tablet (300 mg total) by mouth daily.     Allergies:   Erythromycin, Losartan, Metoprolol, Simvastatin, Dexlansoprazole, Lisinopril, and Prednisone   Social History   Socioeconomic History  . Marital status: Divorced    Spouse name: Not on file  . Number of children: 1  . Years of education: Not on file  . Highest education level: Bachelor's degree (e.g., BA, AB, BS)  Occupational History  . Occupation: retired in 2001    Comment: social work Scientist, physiological  Tobacco Use  . Smoking status: Former Smoker    Packs/day: 1.00    Years: 10.00    Pack years: 10.00    Types:  Cigarettes    Quit date: 11/28/1976    Years since quitting: 44.3  . Smokeless tobacco: Never Used  Vaping Use  . Vaping Use: Never used  Substance and Sexual Activity  . Alcohol use: Not Currently    Comment: rarely, at holidays  . Drug use: No  . Sexual activity: Not Currently  Other Topics Concern  . Not on file  Social History Narrative  . Not on file   Social Determinants of Health   Financial Resource Strain: Low Risk   . Difficulty of Paying Living Expenses: Not hard at all  Food Insecurity: No Food Insecurity  . Worried About Charity fundraiser in the Last Year: Never true  . Ran Out of Food in the Last Year: Never true  Transportation Needs: No Transportation Needs  . Lack of Transportation (Medical): No  . Lack of Transportation (Non-Medical): No  Physical Activity: Inactive  . Days of Exercise per Week: 0 days  . Minutes of Exercise per Session: 0 min  Stress: No Stress Concern Present  . Feeling of Stress : Not at all  Social Connections: Moderately Isolated  . Frequency of Communication with Friends and Family: More than three times a week  . Frequency of Social Gatherings with Friends and Family: More than three times a week  . Attends Religious Services: Never  . Active Member of Clubs or Organizations: Yes  . Attends Archivist Meetings: More than 4 times per year  . Marital Status: Divorced     Family History: The patient's family history includes Diabetes in her father; Heart disease (age of onset: 48) in her father; Hypertension in her mother; Myelodysplastic syndrome in her sister; Prostate cancer in her paternal grandfather; Seizures in her mother. There is no history of Breast cancer or Cervical cancer.  ROS:   Please see the history of present illness.     All other systems reviewed and are negative.  EKGs/Labs/Other Studies Reviewed:    The following studies were reviewed today:   EKG:  EKG not ordered today.   Recent  Labs: 05/05/2020: ALT 9 01/27/2021: BNP 64.5; Hemoglobin 13.7; Platelets 274; TSH 2.860 02/06/2021: BUN 31; Creatinine, Ser 1.21; Potassium 4.0; Sodium 136  Recent Lipid Panel    Component Value Date/Time   CHOL 228 (H) 05/05/2020 1603   TRIG 125 05/05/2020 1603   HDL 81 05/05/2020 1603   CHOLHDL 3.1 06/22/2018 0840   LDLCALC 125 (H) 05/05/2020 1603     Risk Assessment/Calculations:      Physical Exam:    VS:  BP 114/67 (BP Location: Right Wrist, Patient Position: Sitting, Cuff Size: Normal)   Pulse 85   Ht 5' (1.524 m)   Wt 241 lb (109.3 kg)   SpO2 97%   BMI 47.07 kg/m  Wt Readings from Last 3 Encounters:  03/23/21 241 lb (109.3 kg)  01/30/21 241 lb (109.3 kg)  01/27/21 240 lb 11.2 oz (109.2 kg)     GEN:  Well nourished, well developed in no acute distress HEENT: Normal NECK: Unable to assess JVD due to body habitus LYMPHATICS: No lymphadenopathy CARDIAC: RRR, no murmurs, rubs, gallops RESPIRATORY: Decreased breath sounds at bases ABDOMEN: Soft, non-tender, non-distended MUSCULOSKELETAL:  2+ edema; No deformity  SKIN: Warm and dry NEUROLOGIC:  Alert and oriented x 3 PSYCHIATRIC:  Normal affect   ASSESSMENT:    1. Dyspnea on exertion   2. Primary hypertension   3. Morbid obesity ([place])    PLAN:    In order of problems listed above:  1. Dyspnea on exertion, lower extremity edema.  Echo showed normal systolic function, impaired relaxation/grade 1 diastolic dysfunction.  Continue torsemide 40 mg daily, check BMP in 2 weeks.  Symptoms of dyspnea do not seem to be anginal equivalent.  Morbid obesity, possible OSA likely contributing. 2. Hypertension, BP controlled.  But patient has fatigue, diarrhea which is present in 3% of the time with lab results.  Reduce irbesartan to 150 mg daily.  If symptoms persist, consider switch to valsartan instead. 3. Morbid obesity, weight loss advised.  Follow-up in 4 to 6 weeks     Medication Adjustments/Labs and Tests  Ordered: Current medicines are reviewed at length with the patient today.  Concerns regarding medicines are outlined above.  Orders Placed This Encounter  Procedures  . Basic metabolic panel   Meds ordered this encounter  Medications  . irbesartan (AVAPRO) 300 MG tablet    Sig: Take 0.5 tablets (150 mg total) by mouth daily.  Marland Kitchen torsemide (DEMADEX) 20 MG tablet    Sig: Take 2 tablets (40 mg total) by mouth daily.    Dispense:  60 tablet    Refill:  5    Patient Instructions  Medication Instructions:   1.  REDUCE your Irbesartan to 150 MG daily (0.5 pill)   *If you need a refill on your cardiac medications before your next appointment, please call your pharmacy*   Lab Work:  Your physician recommends that you return for lab work in: 2 weeks.  - Please go to the [facility]. You will check in at the front desk to the right as you walk into the atrium. Valet Parking is offered if needed. - No appointment needed. You may go any day between 7 am and 6 pm.   Testing/Procedures: None ordered   Follow-Up: At [facility], you and your health needs are our priority.  As part of our continuing mission to provide you with exceptional heart care, we have created designated Provider Care Teams.  These Care Teams include your primary Cardiologist (physician) and Advanced Practice Providers (APPs -  Physician Assistants and Nurse Practitioners) who all work together to provide you with the care you need, when you need it.  We recommend signing up for the patient portal called "MyChart".  Sign up information is provided on this After Visit Summary.  MyChart is used to connect with patients for Virtual Visits (Telemedicine).  Patients are able to view lab/test results, encounter notes, upcoming appointments, etc.  Non-urgent messages can be sent to your provider as well.   To learn more about what you can do with MyChart, go to [email or web address].    Your next appointment:    4-6 weeks   The format for your next appointment:   In  Person  Provider:   Kate Sable, MD   Other Instructions      Signed, Kate Sable, MD  03/23/2021 5:25 PM    [facility]

## 2021-03-26 ENCOUNTER — Encounter: Payer: Self-pay | Admitting: Internal Medicine

## 2021-03-26 ENCOUNTER — Other Ambulatory Visit: Payer: Self-pay

## 2021-03-26 ENCOUNTER — Ambulatory Visit (INDEPENDENT_AMBULATORY_CARE_PROVIDER_SITE_OTHER): Payer: Medicare Other | Admitting: Internal Medicine

## 2021-03-26 VITALS — BP 110/60 | HR 78 | Temp 97.3°F | Ht 60.0 in | Wt 241.2 lb

## 2021-03-26 DIAGNOSIS — R400 Somnolence: Secondary | ICD-10-CM | POA: Diagnosis not present

## 2021-03-26 NOTE — Progress Notes (Signed)
Name: Gloria Mercer MRN: [id number] DOB: 1941-05-02     CONSULTATION DATE: 03/26/2021  REFERRING MD : Charlestine Night  CHIEF COMPLAINT: daytime sleepiness   HISTORY OF PRESENT ILLNESS: Patient is seen today for problems and issues with sleep related to excessive daytime sleepiness Patient  has been having sleep problems for many years Patient has been having excessive daytime sleepiness for a long time Patient has been having extreme fatigue and tiredness, lack of energy   Discussed sleep data and reviewed with patient.  Encouraged proper weight management.  Discussed driving precautions and its relationship with hypersomnolence.  Discussed operating dangerous equipment and its relationship with hypersomnolence.  Discussed sleep hygiene, and benefits of a fixed sleep waked time.  The importance of getting eight or more hours of sleep discussed with patient.  Discussed limiting the use of the computer and television before bedtime.  Decrease naps during the day, so night time sleep will become enhanced.  Limit caffeine, and sleep deprivation.  HTN, stroke, and heart failure are potential risk factors.    No exacerbation at this time No evidence of heart failure at this time No evidence or signs of infection at this time No respiratory distress No fevers, chills, nausea, vomiting, diarrhea No evidence of lower extremity edema No evidence hemoptysis  EPWORTH SLEEP SCORE  5  PATE INT WITH H/o REACTIVE AIRWAYS DIEASE DUE TO ALLERGHIC RHINITIS   PAST MEDICAL HISTORY :   has a past medical history of Allergic rhinitis, Cataract, GERD (gastroesophageal reflux disease), Hypertension, Macular degeneration, Osteoarthritis, and Sciatica.  has a past surgical history that includes Tubal ligation (1978) and Cholecystectomy (2002). Prior to Admission medications   Medication Sig Start Date End Date Taking? Authorizing Provider  azelastine (ASTELIN) 0.1 % nasal spray Place 2 sprays into  both nostrils daily. 12/08/18   Virginia Crews, MD  beclomethasone (QVAR REDIHALER) 80 MCG/ACT inhaler Inhale 2 puffs into the lungs 2 (two) times daily. 12/22/20   Virginia Crews, MD  Cholecalciferol (VITAMIN D3) 5000 units CAPS Take 1 capsule by mouth daily. 04/17/13   [provider]  clotrimazole-betamethasone (LOTRISONE) cream APPLY EXTERNALLY TO THE AFFECTED AREA TWICE DAILY AS NEEDED 01/27/21   [name], Dionne Bucy, MD  EPINEPHrine 0.3 mg/0.3 mL IJ SOAJ injection as needed.  10/11/18   [provider]  esomeprazole (NEXIUM) 20 MG capsule Take 40 mg by mouth daily at 12 noon.     [provider]  ezetimibe (ZETIA) 10 MG tablet Take 1 tablet (10 mg total) by mouth daily. 06/23/20   Mar Daring, PA-C  ipratropium (ATROVENT) 0.03 % nasal spray Place 2 sprays into both nostrils every 8 (eight) hours as needed. 08/14/19   [name], Dionne Bucy, MD  irbesartan (AVAPRO) 300 MG tablet Take 0.5 tablets (150 mg total) by mouth daily. 03/23/21   Kate Sable, MD  levocetirizine (XYZAL) 5 MG tablet Take 5 mg by mouth every evening.    [provider]  montelukast (SINGULAIR) 10 MG tablet TAKE 1 TABLET(10 MG) BY MOUTH DAILY 06/16/20   [name], Dionne Bucy, MD  Multiple Vitamins-Minerals (EYE VITAMINS PO) Take 2 tablets by mouth daily.     [provider]  Omega-3 Fatty Acids (FISH OIL) 1000 MG CAPS Take 1 capsule by mouth daily.    [provider]  potassium chloride SA (KLOR-CON) 20 MEQ tablet Take 1 tablet (20 mEq total) by mouth daily. 01/30/21   Kate Sable, MD  pramipexole (MIRAPEX) 0.5 MG tablet Take 1  tablet (0.5 mg total) by mouth at bedtime. 01/23/21   Virginia Crews, MD  PREVIDENT 5000 DRY MOUTH 1.1 % GEL dental gel Place 1 application onto teeth. 2-3xs daily 07/18/19   [provider]  torsemide (DEMADEX) 20 MG tablet Take 2 tablets (40 mg total) by mouth daily. 03/23/21   Kate Sable, MD   triamcinolone (NASACORT) 55 MCG/ACT AERO nasal inhaler Place 2 sprays into the nose daily.    [provider]  triamcinolone ointment (KENALOG) 0.5 % Apply 1 application topically 2 (two) times daily. 01/27/21   Virginia Crews, MD   Allergies  Allergen Reactions  . Erythromycin Other (See Comments)    Other reaction(s): Headache Headache and GI upset Headache and GI upset  . Losartan Nausea And Vomiting  . Metoprolol Nausea And Vomiting  . Simvastatin Other (See Comments)    Other reaction(s): Muscle Pain Pain lower extremities Pain lower extremities  . Dexlansoprazole Diarrhea  . Lisinopril Cough  . Prednisone     Other reaction(s): Constipation GI upset    FAMILY HISTORY:  family history includes Diabetes in her father; Heart disease (age of onset: 11) in her father; Hypertension in her mother; Myelodysplastic syndrome in her sister; Prostate cancer in her paternal grandfather; Seizures in her mother. SOCIAL HISTORY:  reports that she quit smoking about 44 years ago. Her smoking use included cigarettes. She has a 10.00 pack-year smoking history. She has never used smokeless tobacco. She reports previous alcohol use. She reports that she does not use drugs.  REVIEW OF SYSTEMS:   Constitutional: Negative for fever, chills, weight loss, malaise/fatigue and diaphoresis.  HENT: Negative for hearing loss, ear pain, nosebleeds, congestion, sore throat, neck pain, tinnitus and ear discharge.   Eyes: Negative for blurred vision, double vision, photophobia, pain, discharge and redness.  Respiratory: Negative for cough, hemoptysis, sputum production, shortness of breath, wheezing and stridor.   Cardiovascular: Negative for chest pain, palpitations, orthopnea, claudication, leg swelling and PND.  Gastrointestinal: Negative for heartburn, nausea, vomiting, abdominal pain, diarrhea, constipation, blood in stool and melena.  Genitourinary: Negative for dysuria, urgency, frequency,  hematuria and flank pain.  Musculoskeletal: Negative for myalgias, back pain, joint pain and falls.  Skin: Negative for itching and rash.  Neurological: Negative for dizziness, tingling, tremors, sensory change, speech change, focal weakness, seizures, loss of consciousness, weakness and headaches.  Endo/Heme/Allergies: Negative for environmental allergies and polydipsia. Does not bruise/bleed easily.  ALL OTHER ROS ARE NEGATIVE   BP 110/60 (BP Location: Left Arm, Patient Position: Sitting, Cuff Size: Normal)   Pulse 78   Temp (!) 97.3 F (36.3 C) (Temporal)   Ht 5' (1.524 m)   Wt 241 lb 3.2 oz (109.4 kg)   SpO2 97%   BMI 47.11 kg/m    Physical Examination:   General Appearance: No distress  Neuro:without focal findings,  speech normal,  HEENT: PERRLA, EOM intact.   Pulmonary: normal breath sounds, No wheezing.  CardiovascularNormal S1,S2.  No m/r/g.   Abdomen: Benign, Soft, non-tender. Renal:  No costovertebral tenderness  GU:  Not performed at this time. Endoc: No evident thyromegaly, no signs of acromegaly. Skin:   warm, no rashes, no ecchymosis  Extremities: normal, no cyanosis, clubbing. NEUROLOGIC: Cranial nerves II through XII are intact. No gross focal neurological deficits.  PSYCHIATRIC: Mood, affect within normal limits.       ASSESSMENT AND PLAN SYNOPSIS  80 yo morbidly obese white female with signs and symptoms of sleep apnea  Will obtain sleep  study to assess   PATIENT WITH H/o REACTIVE AIRWAYS DIEASE DUE TO ALLERGHIC RHINITIS continue inhalers as prescribed  Obesity -recommend significant weight loss -recommend changing diet  Deconditioned state -Recommend increased daily activity and exercise    MEDICATION ADJUSTMENTS/LABS AND TESTS ORDERED: HST Continue inhalers as prescribed  CURRENT MEDICATIONS REVIEWED AT LENGTH WITH PATIENT TODAY   Patient  satisfied with Plan of action and management. All questions answered  Follow up 6  months   TOTAL TIME SPENT 32 mins   Maretta Bees Patricia Pesa, M.D.  [facility]  Medical Director [place] Director [facility] Cardio-Pulmonary Department

## 2021-03-26 NOTE — Patient Instructions (Signed)
Will order Home Sleep Test to assess for sleep apnea

## 2021-03-31 ENCOUNTER — Other Ambulatory Visit: Payer: Self-pay

## 2021-03-31 ENCOUNTER — Encounter: Payer: Self-pay | Admitting: Family Medicine

## 2021-03-31 ENCOUNTER — Ambulatory Visit (INDEPENDENT_AMBULATORY_CARE_PROVIDER_SITE_OTHER): Payer: Medicare Other | Admitting: Family Medicine

## 2021-03-31 VITALS — BP 114/48 | HR 75 | Temp 98.4°F | Resp 16 | Ht 60.0 in | Wt 243.8 lb

## 2021-03-31 DIAGNOSIS — E782 Mixed hyperlipidemia: Secondary | ICD-10-CM | POA: Diagnosis not present

## 2021-03-31 DIAGNOSIS — N1831 Chronic kidney disease, stage 3a: Secondary | ICD-10-CM | POA: Diagnosis not present

## 2021-03-31 DIAGNOSIS — D692 Other nonthrombocytopenic purpura: Secondary | ICD-10-CM | POA: Diagnosis not present

## 2021-03-31 DIAGNOSIS — L853 Xerosis cutis: Secondary | ICD-10-CM

## 2021-03-31 DIAGNOSIS — I10 Essential (primary) hypertension: Secondary | ICD-10-CM

## 2021-03-31 DIAGNOSIS — H353210 Exudative age-related macular degeneration, right eye, stage unspecified: Secondary | ICD-10-CM

## 2021-03-31 DIAGNOSIS — H259 Unspecified age-related cataract: Secondary | ICD-10-CM | POA: Diagnosis not present

## 2021-03-31 NOTE — Assessment & Plan Note (Signed)
Chronic and stable Continue to monitor 

## 2021-03-31 NOTE — Assessment & Plan Note (Signed)
Followed by [place]

## 2021-03-31 NOTE — Assessment & Plan Note (Signed)
Recheck BMP Avoid NSAIDs

## 2021-03-31 NOTE — Assessment & Plan Note (Signed)
Cleared for upcoming surgery by Cardiology and me Will send message to Optho about this

## 2021-03-31 NOTE — Assessment & Plan Note (Signed)
Reviewed last lipid panel Not currently on a statin Recheck FLP and CMP Discussed diet and exercise  

## 2021-03-31 NOTE — Assessment & Plan Note (Signed)
Well controlled Continue current medications Recheck metabolic panel F/u in 6 months  

## 2021-03-31 NOTE — Progress Notes (Signed)
Established patient visit   Patient: Gloria Mercer   DOB: [birth year]   80 y.o. Female  MRN: [id number] Visit Date: 03/31/2021  Today's healthcare provider: Lavon Paganini, MD   Chief Complaint  Patient presents with  . Hypertension   Subjective    Hypertension Pertinent negatives include no chest pain, headaches, neck pain or palpitations.    Patient returns back to clinic today for four week follow up for chronic disease, patient was last seen in office 01/27/21.  She's states that she is doing well today. She denies having any nausea, vomiting, headache, chills, fever, abdominal pain, diarrhea, chest pain, SOB.    Joint Pain She continues to complain of back and hip pain after taking Tylenol. She used to take Asprin daily which treated these symptoms however she was recommended by ortho to quit.  She denies addressing this problem with her ortho physician. She denies taking the surgical approach to manage her pain.  Eye Surgery She is planning to undergo a cataract procedure soon and was instructed to receive a pre-opt evaluation.   Skin Itching She is requesting to receive blood work because she is experiencing a constant itch.   Hypertension, follow-up  BP Readings from Last 3 Encounters:  03/31/21 (!) 114/48  03/26/21 110/60  03/23/21 114/67   Wt Readings from Last 3 Encounters:  03/31/21 243 lb 12.8 oz (110.6 kg)  03/26/21 241 lb 3.2 oz (109.4 kg)  03/23/21 241 lb (109.3 kg)     She was last seen for hypertension 2 months ago.  BP at that visit was 177/85. Management since that visit includes added lasix and continue Maxzide Hydralazine. She feels that her blood pressure has been well controlled since she has been taking potassium, irbesartan, and torsemide to regulate her hypertension and LE swelling. Her irbesartan was decreased to half dosage by her cardiologist because she was extreme fatigue. Her at home blood pressure ranges from 110-120s/40-50s and  she was concerned about her numbers being low.    She reports excellent compliance with treatment. She is not having side effects.  She is following a Regular diet. She is not exercising. She does not smoke.  Use of agents associated with hypertension: none.   Outside blood pressures are stable. Symptoms: No chest pain No chest pressure  No palpitations No syncope  No dyspnea No orthopnea  No paroxysmal nocturnal dyspnea Yes lower extremity edema   Pertinent labs: Lab Results  Component Value Date   CHOL 228 (H) 05/05/2020   HDL 81 05/05/2020   LDLCALC 125 (H) 05/05/2020   TRIG 125 05/05/2020   CHOLHDL 3.1 06/22/2018   Lab Results  Component Value Date   NA 136 02/06/2021   K 4.0 02/06/2021   CREATININE 1.21 (H) 02/06/2021   GFRNONAA 45 (L) 02/06/2021   GFRAA 64 09/29/2020   GLUCOSE 141 (H) 02/06/2021     The ASCVD Risk score (Goff DC Jr., et al., 2013) failed to calculate for the following reasons:   The 2013 ASCVD risk score is only valid for ages 79 to 50   --------------------------------------------------------------------------------------------------- Follow up for Cardiology  The patient was last seen for this 1 weeks ago. Changes made at last visit include reduce Irbesartan to (0.5 tablet)150mg  daily.  She reports excellent compliance with treatment. She feels that condition is Improved. She is not having side effects.  Patient reports Dr. Garen Lah cleared her for cataract surgery. -----------------------------------------------------------------------------------------    Patient Active Problem List  Diagnosis Date Noted  . Chronic instability of right knee 06/24/2020  . Senile purpura ([place]) 05/06/2020  . Chronic pain of right knee 05/05/2020  . Nausea 05/05/2020  . Chronic back pain 07/09/2018  . Constipation 01/12/2018  . RLS (restless legs syndrome) 08/29/2017  . Pedal edema 08/29/2017  . Essential hypertension   . Primary  osteoarthritis of right knee   . Gastroesophageal reflux disease without esophagitis   . Cataract   . Macular degeneration   . Allergic rhinitis   . Hypertensive heart disease without heart failure 07/18/2017  . Snoring 07/18/2017  . Prediabetes 07/05/2017  . Colon polyps 01/12/2017  . Diverticulosis of large intestine without hemorrhage 01/12/2017  . External hemorrhoids 01/12/2017  . Gastric nodule 01/12/2017  . Hiatal hernia 01/12/2017  . Internal hemorrhoids 01/12/2017  . Lipoma of colon 01/12/2017  . Multiple gastric polyps 01/12/2017  . Schatzki's ring of distal esophagus 01/12/2017  . Bloating 12/23/2016  . Dysphagia 12/23/2016  . Epigastric pain 12/23/2016  . Heartburn 12/23/2016  . History of colonic polyps 12/23/2016  . Rectal bleeding 12/23/2016  . Chronic renal impairment, stage 3 (moderate) ([place]) 04/02/2016  . Age-related nuclear cataract of both eyes 10/31/2015  . Exudative age-related macular degeneration of right eye ([place]) 10/31/2015  . Nuclear sclerotic cataract of both eyes 10/31/2015  . Nonexudative age-related macular degeneration, left eye, advanced atrophic with subfoveal involvement 10/31/2015  . Bronchitis, mucopurulent recurrent ([place]) 03/31/2015  . Age-related macular degeneration, dry, left eye 02/18/2015  . Age-related macular degeneration, wet, right eye ([place]) 02/18/2015  . Morbid obesity ([place]) 05/23/2014  . Diastolic dysfunction 24/40/1027  . Intertriginous candidiasis 06/16/2013  . Mixed hyperlipidemia 10/19/2012  . PA (pernicious anemia) 10/19/2012  . Vitamin D deficiency 10/19/2012   Past Surgical History:  Procedure Laterality Date  . CHOLECYSTECTOMY  2002  . TUBAL LIGATION  1978   Social History   Socioeconomic History  . Marital status: Divorced    Spouse name: Not on file  . Number of children: 1  . Years of education: Not on file  . Highest education level: Bachelor's degree (e.g., BA, AB, BS)  Occupational History  .  Occupation: retired in 2001    Comment: social work Scientist, physiological  Tobacco Use  . Smoking status: Former Smoker    Packs/day: 1.00    Years: 10.00    Pack years: 10.00    Types: Cigarettes    Quit date: 11/28/1976    Years since quitting: 44.3  . Smokeless tobacco: Never Used  Vaping Use  . Vaping Use: Never used  Substance and Sexual Activity  . Alcohol use: Not Currently    Comment: rarely, at holidays  . Drug use: No  . Sexual activity: Not Currently  Other Topics Concern  . Not on file  Social History Narrative  . Not on file   Social Determinants of Health   Financial Resource Strain: Low Risk   . Difficulty of Paying Living Expenses: Not hard at all  Food Insecurity: No Food Insecurity  . Worried About Charity fundraiser in the Last Year: Never true  . Ran Out of Food in the Last Year: Never true  Transportation Needs: No Transportation Needs  . Lack of Transportation (Medical): No  . Lack of Transportation (Non-Medical): No  Physical Activity: Inactive  . Days of Exercise per Week: 0 days  . Minutes of Exercise per Session: 0 min  Stress: No Stress Concern Present  . Feeling of Stress : Not at  all  Social Connections: Moderately Isolated  . Frequency of Communication with Friends and Family: More than three times a week  . Frequency of Social Gatherings with Friends and Family: More than three times a week  . Attends Religious Services: Never  . Active Member of Clubs or Organizations: Yes  . Attends Archivist Meetings: More than 4 times per year  . Marital Status: Divorced  Human resources officer Violence: Not At Risk  . Fear of Current or Ex-Partner: No  . Emotionally Abused: No  . Physically Abused: No  . Sexually Abused: No   Allergies  Allergen Reactions  . Erythromycin Other (See Comments)    Other reaction(s): Headache Headache and GI upset Headache and GI upset  . Losartan Nausea And Vomiting  . Metoprolol Nausea And Vomiting  .  Simvastatin Other (See Comments)    Other reaction(s): Muscle Pain Pain lower extremities Pain lower extremities  . Dexlansoprazole Diarrhea  . Lisinopril Cough  . Prednisone     Other reaction(s): Constipation GI upset       Medications: Outpatient Medications Prior to Visit  Medication Sig  . azelastine (ASTELIN) 0.1 % nasal spray Place 2 sprays into both nostrils daily.  . beclomethasone (QVAR REDIHALER) 80 MCG/ACT inhaler Inhale 2 puffs into the lungs 2 (two) times daily.  . Cholecalciferol (VITAMIN D3) 5000 units CAPS Take 1 capsule by mouth daily.  . clotrimazole-betamethasone (LOTRISONE) cream APPLY EXTERNALLY TO THE AFFECTED AREA TWICE DAILY AS NEEDED  . esomeprazole (NEXIUM) 20 MG capsule Take 40 mg by mouth daily at 12 noon.   . ezetimibe (ZETIA) 10 MG tablet Take 1 tablet (10 mg total) by mouth daily.  Marland Kitchen ipratropium (ATROVENT) 0.03 % nasal spray Place 2 sprays into both nostrils every 8 (eight) hours as needed.  . irbesartan (AVAPRO) 300 MG tablet Take 0.5 tablets (150 mg total) by mouth daily.  Marland Kitchen levocetirizine (XYZAL) 5 MG tablet Take 5 mg by mouth every evening.  . montelukast (SINGULAIR) 10 MG tablet TAKE 1 TABLET(10 MG) BY MOUTH DAILY  . Multiple Vitamins-Minerals (EYE VITAMINS PO) Take 2 tablets by mouth daily.   . Omega-3 Fatty Acids (FISH OIL) 1000 MG CAPS Take 1 capsule by mouth daily.  . potassium chloride SA (KLOR-CON) 20 MEQ tablet Take 1 tablet (20 mEq total) by mouth daily.  . pramipexole (MIRAPEX) 0.5 MG tablet Take 1 tablet (0.5 mg total) by mouth at bedtime.  Marland Kitchen PREVIDENT 5000 DRY MOUTH 1.1 % GEL dental gel Place 1 application onto teeth. 2-3xs daily  . torsemide (DEMADEX) 20 MG tablet Take 2 tablets (40 mg total) by mouth daily.  Marland Kitchen triamcinolone (NASACORT) 55 MCG/ACT AERO nasal inhaler Place 2 sprays into the nose daily.  Marland Kitchen triamcinolone ointment (KENALOG) 0.5 % Apply 1 application topically 2 (two) times daily.  Marland Kitchen EPINEPHrine 0.3 mg/0.3 mL IJ SOAJ  injection as needed.  (Patient not taking: Reported on 03/31/2021)   No facility-administered medications prior to visit.    Review of Systems  Constitutional: Negative for appetite change, fatigue and fever.  HENT: Negative for ear pain, sinus pressure, sinus pain and sore throat.   Eyes: Negative for pain.  Respiratory: Negative for cough and chest tightness.   Cardiovascular: Positive for leg swelling (mild). Negative for chest pain and palpitations.  Gastrointestinal: Negative for abdominal pain, blood in stool, diarrhea, nausea and vomiting.  Genitourinary: Negative for flank pain, frequency and urgency.  Musculoskeletal: Negative for neck pain and neck stiffness.       (+)  Joint Pain(Back and hip)  Skin:       (+)Itchy Skin  Allergic/Immunologic: Positive for environmental allergies.  Neurological: Negative for dizziness, weakness and headaches.       Objective    BP (!) 114/48 (BP Location: Right Arm, Patient Position: Sitting, Cuff Size: Large)   Pulse 75   Temp 98.4 F (36.9 C) (Oral)   Resp 16   Ht 5' (1.524 m)   Wt 243 lb 12.8 oz (110.6 kg)   SpO2 99%   BMI 47.61 kg/m  BP Readings from Last 3 Encounters:  03/31/21 (!) 114/48  03/26/21 110/60  03/23/21 114/67   Wt Readings from Last 3 Encounters:  03/31/21 243 lb 12.8 oz (110.6 kg)  03/26/21 241 lb 3.2 oz (109.4 kg)  03/23/21 241 lb (109.3 kg)       Physical Exam Vitals reviewed.  Constitutional:      General: She is not in acute distress.    Appearance: Normal appearance. She is well-developed. She is not diaphoretic.  HENT:     Head: Normocephalic and atraumatic.  Eyes:     General: No scleral icterus.    Conjunctiva/sclera: Conjunctivae normal.  Neck:     Thyroid: No thyromegaly.  Cardiovascular:     Rate and Rhythm: Normal rate and regular rhythm.     Pulses: Normal pulses.     Heart sounds: Normal heart sounds. No murmur heard.   Pulmonary:     Effort: Pulmonary effort is normal. No  respiratory distress.     Breath sounds: Normal breath sounds. No wheezing, rhonchi or rales.  Musculoskeletal:     Cervical back: Neck supple.     Right lower leg: Edema present.     Left lower leg: Edema present.  Lymphadenopathy:     Cervical: No cervical adenopathy.  Skin:    General: Skin is warm and dry.     Findings: No rash.  Neurological:     Mental Status: She is alert and oriented to person, place, and time. Mental status is at baseline.  Psychiatric:        Mood and Affect: Mood normal.        Behavior: Behavior normal.      No results found for any visits on 03/31/21.  Assessment & Plan      Problem List Items Addressed This Visit      Cardiovascular and Mediastinum   Essential hypertension - Primary    Well controlled Continue current medications Recheck metabolic panel F/u in 6 months       Relevant Orders   Comprehensive metabolic panel   Senile purpura (HCC)    Chronic and stable Continue to monitor      Relevant Orders   CBC with Differential/Platelet     Genitourinary   Chronic renal impairment, stage 3 (moderate) (HCC)    Recheck BMP Avoid NSAIDs      Relevant Orders   TSH     Other   Cataract    Cleared for upcoming surgery by Cardiology and me Will send message to Optho about this      Age-related macular degeneration, wet, right eye ([place])    Followed by Optho      Mixed hyperlipidemia    Reviewed last lipid panel Not currently on a statin Recheck FLP and CMP Discussed diet and exercise       Relevant Orders   Comprehensive metabolic panel   Lipid panel   TSH    Other Visit Diagnoses  Xerosis of skin       Relevant Orders   CBC with Differential/Platelet   TSH       Return in about 6 months (around 10/01/2021) for AWV, chronic disease f/u.      I,Essence Turner,acting as a Education administrator for Lavon Paganini, MD.,have documented all relevant documentation on the behalf of Lavon Paganini, MD,as directed by  Lavon Paganini, MD while in the presence of Lavon Paganini, MD.  I, Lavon Paganini, MD, have reviewed all documentation for this visit. The documentation on 03/31/21 for the exam, diagnosis, procedures, and orders are all accurate and complete.   Torie Priebe, Dionne Bucy, MD, MPH [facility]

## 2021-04-01 LAB — LIPID PANEL W/O CHOL/HDL RATIO
Cholesterol, Total: 198 mg/dL (ref 100–199)
HDL: 70 mg/dL (ref >39)
LDL Chol Calc (NIH): 105 mg/dL — ABNORMAL HIGH (ref 0–99)
Triglycerides: 135 mg/dL (ref 0–149)
VLDL Cholesterol Cal: 23 mg/dL (ref 5–40)

## 2021-04-01 LAB — CBC WITH DIFFERENTIAL/PLATELET
Basophils Absolute: 0 10*3/uL (ref 0.0–0.2)
Basos: 0 %
EOS (ABSOLUTE): 0.2 10*3/uL (ref 0.0–0.4)
Eos: 3 %
Hematocrit: 38.1 % (ref 34.0–46.6)
Hemoglobin: 12.5 g/dL (ref 11.1–15.9)
Immature Grans (Abs): 0 10*3/uL (ref 0.0–0.1)
Immature Granulocytes: 0 %
Lymphocytes Absolute: 2.2 10*3/uL (ref 0.7–3.1)
Lymphs: 26 %
MCH: 29.3 pg (ref 26.6–33.0)
MCHC: 32.8 g/dL (ref 31.5–35.7)
MCV: 89 fL (ref 79–97)
Monocytes Absolute: 0.6 10*3/uL (ref 0.1–0.9)
Monocytes: 7 %
Neutrophils Absolute: 5.5 10*3/uL (ref 1.4–7.0)
Neutrophils: 64 %
Platelets: 246 10*3/uL (ref 150–450)
RBC: 4.27 x10E6/uL (ref 3.77–5.28)
RDW: 13 % (ref 11.7–15.4)
WBC: 8.7 10*3/uL (ref 3.4–10.8)

## 2021-04-01 LAB — COMPREHENSIVE METABOLIC PANEL
ALT: 8 IU/L (ref 0–32)
AST: 10 IU/L (ref 0–40)
Albumin/Globulin Ratio: 1.9 (ref 1.2–2.2)
Albumin: 4 g/dL (ref 3.7–4.7)
Alkaline Phosphatase: 99 IU/L (ref 44–121)
BUN/Creatinine Ratio: 23 (ref 12–28)
BUN: 31 mg/dL — ABNORMAL HIGH (ref 8–27)
Bilirubin Total: 0.3 mg/dL (ref 0.0–1.2)
CO2: 25 mmol/L (ref 20–29)
Calcium: 9.3 mg/dL (ref 8.7–10.3)
Chloride: 99 mmol/L (ref 96–106)
Creatinine, Ser: 1.37 mg/dL — ABNORMAL HIGH (ref 0.57–1.00)
Globulin, Total: 2.1 g/dL (ref 1.5–4.5)
Glucose: 114 mg/dL — ABNORMAL HIGH (ref 65–99)
Potassium: 4.6 mmol/L (ref 3.5–5.2)
Sodium: 139 mmol/L (ref 134–144)
Total Protein: 6.1 g/dL (ref 6.0–8.5)
eGFR: 39 mL/min/{1.73_m2} — ABNORMAL LOW (ref >59)

## 2021-04-01 LAB — TSH: TSH: 3.72 u[IU]/mL (ref 0.450–4.500)

## 2021-04-02 ENCOUNTER — Telehealth: Payer: Self-pay

## 2021-04-02 DIAGNOSIS — I10 Essential (primary) hypertension: Secondary | ICD-10-CM

## 2021-04-02 NOTE — Telephone Encounter (Signed)
-----   Message from Virginia Crews, MD sent at 04/02/2021 10:36 AM EDT ----- Normal/stable labs, except for worsening of kidney function. Recommend nephrology referral. Ok to place for CKD if patient agrees.

## 2021-04-02 NOTE — Telephone Encounter (Signed)
Patient advised, agreed to referral.

## 2021-04-24 DIAGNOSIS — N1832 Chronic kidney disease, stage 3b: Secondary | ICD-10-CM | POA: Insufficient documentation

## 2021-05-07 ENCOUNTER — Other Ambulatory Visit: Payer: Self-pay

## 2021-05-07 ENCOUNTER — Ambulatory Visit: Payer: Medicare Other

## 2021-05-07 DIAGNOSIS — G4733 Obstructive sleep apnea (adult) (pediatric): Secondary | ICD-10-CM | POA: Diagnosis not present

## 2021-05-07 DIAGNOSIS — R400 Somnolence: Secondary | ICD-10-CM

## 2021-05-11 ENCOUNTER — Ambulatory Visit: Payer: Medicare Other | Admitting: Cardiology

## 2021-05-12 DIAGNOSIS — G4733 Obstructive sleep apnea (adult) (pediatric): Secondary | ICD-10-CM | POA: Diagnosis not present

## 2021-05-14 ENCOUNTER — Telehealth: Payer: Self-pay

## 2021-05-14 NOTE — Telephone Encounter (Signed)
Dr. Mortimer Fries, please review HST results. Thanks

## 2021-05-14 NOTE — Telephone Encounter (Signed)
-----   Message from Satira Anis sent at 05/14/2021 10:34 AM EDT ----- HST has been read & scanned into pt's chart.  I have also sent this to Dr. Mortimer Fries.

## 2021-05-19 NOTE — Telephone Encounter (Signed)
Spoke to patient and relayed below results/recommendations. Patient does not wish to proceed with cpap. She can not wear anything over her face.  Dr. Mortimer Fries, please advise. thanks

## 2021-05-19 NOTE — Telephone Encounter (Signed)
Lm for patient.  

## 2021-05-20 NOTE — Telephone Encounter (Signed)
Spoke to patient and relayed below message/recommendations.  Patient stated that she would need to think about the oral appliance and she will call back with update.

## 2021-05-21 ENCOUNTER — Telehealth: Payer: Self-pay | Admitting: Cardiology

## 2021-05-21 NOTE — Telephone Encounter (Signed)
Patient calling States that she has an upset stomach and chills - not sure if she should come to appt tomorrow  States she does not think it is COVID but coming from medication Please call to discuss

## 2021-05-22 ENCOUNTER — Encounter: Payer: Self-pay | Admitting: Cardiology

## 2021-05-22 ENCOUNTER — Other Ambulatory Visit: Payer: Self-pay

## 2021-05-22 ENCOUNTER — Ambulatory Visit (INDEPENDENT_AMBULATORY_CARE_PROVIDER_SITE_OTHER): Payer: Medicare Other | Admitting: Cardiology

## 2021-05-22 VITALS — BP 118/68 | HR 88 | Ht 60.0 in | Wt 247.0 lb

## 2021-05-22 DIAGNOSIS — I10 Essential (primary) hypertension: Secondary | ICD-10-CM | POA: Diagnosis not present

## 2021-05-22 DIAGNOSIS — R600 Localized edema: Secondary | ICD-10-CM

## 2021-05-22 MED ORDER — HYDRALAZINE HCL 50 MG PO TABS: 50.0000 mg | ORAL_TABLET | Freq: Three times a day (TID) | ORAL | 3 refills | Status: DC

## 2021-05-22 NOTE — Telephone Encounter (Signed)
Patient in office now. Closing encounter.

## 2021-05-22 NOTE — Patient Instructions (Signed)
Medication Instructions:   Your physician has recommended you make the following change in your medication:    STOP taking your Irbesartan.  2.    START taking Hydralazine 50 MG three times a day.   *If you need a refill on your cardiac medications before your next appointment, please call your pharmacy*   Lab Work: None ordered If you have labs (blood work) drawn today and your tests are completely normal, you will receive your results only by: [place] (if you have MyChart) OR A paper copy in the mail If you have any lab test that is abnormal or we need to change your treatment, we will call you to review the results.   Testing/Procedures: None ordered   Follow-Up: At [facility], you and your health needs are our priority.  As part of our continuing mission to provide you with exceptional heart care, we have created designated Provider Care Teams.  These Care Teams include your primary Cardiologist (physician) and Advanced Practice Providers (APPs -  Physician Assistants and Nurse Practitioners) who all work together to provide you with the care you need, when you need it.  We recommend signing up for the patient portal called "MyChart".  Sign up information is provided on this After Visit Summary.  MyChart is used to connect with patients for Virtual Visits (Telemedicine).  Patients are able to view lab/test results, encounter notes, upcoming appointments, etc.  Non-urgent messages can be sent to your provider as well.   To learn more about what you can do with MyChart, go to [email or web address].    Your next appointment:   6 week(s)  The format for your next appointment:   In Person  Provider:   Kate Sable, MD   Other Instructions

## 2021-05-22 NOTE — Progress Notes (Signed)
Cardiology Office Note:    Date:  05/22/2021   ID:  Gloria Mercer, DOB 27-Aug-1941, MRN [id number]  PCP:  Virginia Crews, MD   [place]  Cardiologist:  Kate Sable, MD  Advanced Practice Provider:  No care team member to display Electrophysiologist:  None       Referring MD: Virginia Crews, MD   Chief Complaint  Patient presents with   Follow-up    States BP is down---states stomach has been acting up, feeling lousy. Changes in medications have not helped, still has swelling in feet.     History of Present Illness:    Gloria Mercer is a 80 y.o. female with a hx of hypertension presents for follow-up.  She was last seen due to elevated blood pressure, shortness of breath on exertion, and edema  Also had symptoms of diarrhea, which occurred after starting irbesartan.Irbesartan dose was decreased to 150 mg daily.  Torsemide 40 mg daily continue for edema.  She still continues to have diarrhea, nausea.  Has a history of nausea and vomiting with ARB/losartan.  Has appointment to see a nephrologist next month.  Blood pressures have been well controlled on current dose of irbesartan.  To hydralazine in the past, unsure why it was stopped  Prior notes Echocardiogram 5/85/9292 normal systolic function, impaired relaxation, EF 55 to 60%.    Past Medical History:  Diagnosis Date   Allergic rhinitis    on allergy shots, sees Dr. Pryor Ochoa   Cataract    GERD (gastroesophageal reflux disease)    Hypertension    Macular degeneration    followed by Dr Garwin Brothers at [facility]   Osteoarthritis    Sciatica     Past Surgical History:  Procedure Laterality Date   CHOLECYSTECTOMY  2002   TUBAL LIGATION  1978    Current Medications: Current Meds  Medication Sig   azelastine (ASTELIN) 0.1 % nasal spray Place 2 sprays into both nostrils daily.   beclomethasone (QVAR REDIHALER) 80 MCG/ACT inhaler Inhale 2 puffs into the lungs 2 (two) times daily.    Cholecalciferol (VITAMIN D3) 5000 units CAPS Take 1 capsule by mouth daily.   clotrimazole-betamethasone (LOTRISONE) cream APPLY EXTERNALLY TO THE AFFECTED AREA TWICE DAILY AS NEEDED   esomeprazole (NEXIUM) 20 MG capsule Take 40 mg by mouth daily at 12 noon.    ezetimibe (ZETIA) 10 MG tablet Take 1 tablet (10 mg total) by mouth daily.   hydrALAZINE (APRESOLINE) 50 MG tablet Take 1 tablet (50 mg total) by mouth 3 (three) times daily.   ipratropium (ATROVENT) 0.03 % nasal spray Place 2 sprays into both nostrils every 8 (eight) hours as needed.   levocetirizine (XYZAL) 5 MG tablet Take 5 mg by mouth every evening.   montelukast (SINGULAIR) 10 MG tablet TAKE 1 TABLET(10 MG) BY MOUTH DAILY   Multiple Vitamins-Minerals (EYE VITAMINS PO) Take 2 tablets by mouth daily.    Omega-3 Fatty Acids (FISH OIL) 1000 MG CAPS Take 1 capsule by mouth daily.   potassium chloride SA (KLOR-CON) 20 MEQ tablet Take 1 tablet (20 mEq total) by mouth daily.   pramipexole (MIRAPEX) 0.5 MG tablet Take 1 tablet (0.5 mg total) by mouth at bedtime.   PREVIDENT 5000 DRY MOUTH 1.1 % GEL dental gel Place 1 application onto teeth. 2-3xs daily   torsemide (DEMADEX) 20 MG tablet Take 2 tablets (40 mg total) by mouth daily.   triamcinolone (NASACORT) 55 MCG/ACT AERO nasal inhaler Place 2 sprays into  the nose daily.   triamcinolone ointment (KENALOG) 0.5 % Apply 1 application topically 2 (two) times daily.   [DISCONTINUED] irbesartan (AVAPRO) 300 MG tablet Take 0.5 tablets (150 mg total) by mouth daily.     Allergies:   Erythromycin, Losartan, Metoprolol, Simvastatin, Dexlansoprazole, Lisinopril, and Prednisone   Social History   Socioeconomic History   Marital status: Divorced    Spouse name: Not on file   Number of children: 1   Years of education: Not on file   Highest education level: Bachelor's degree (e.g., BA, AB, BS)  Occupational History   Occupation: retired in 2001    Comment: social work Scientist, physiological  Tobacco  Use   Smoking status: Former    Packs/day: 1.00    Years: 10.00    Pack years: 10.00    Types: Cigarettes    Quit date: 11/28/1976    Years since quitting: 44.5   Smokeless tobacco: Never  Vaping Use   Vaping Use: Never used  Substance and Sexual Activity   Alcohol use: Not Currently    Comment: rarely, at holidays   Drug use: No   Sexual activity: Not Currently  Other Topics Concern   Not on file  Social History Narrative   Not on file   Social Determinants of Health   Financial Resource Strain: Low Risk    Difficulty of Paying Living Expenses: Not hard at all  Food Insecurity: No Food Insecurity   Worried About Charity fundraiser in the Last Year: Never true   [place] in the Last Year: Never true  Transportation Needs: No Transportation Needs   Lack of Transportation (Medical): No   Lack of Transportation (Non-Medical): No  Physical Activity: Inactive   Days of Exercise per Week: 0 days   Minutes of Exercise per Session: 0 min  Stress: No Stress Concern Present   Feeling of Stress : Not at all  Social Connections: Moderately Isolated   Frequency of Communication with Friends and Family: More than three times a week   Frequency of Social Gatherings with Friends and Family: More than three times a week   Attends Religious Services: Never   Marine scientist or Organizations: Yes   Attends Music therapist: More than 4 times per year   Marital Status: Divorced     Family History: The patient's family history includes Diabetes in her father; Heart disease (age of onset: 19) in her father; Hypertension in her mother; Myelodysplastic syndrome in her sister; Prostate cancer in her paternal grandfather; Seizures in her mother. There is no history of Breast cancer or Cervical cancer.  ROS:   Please see the history of present illness.     All other systems reviewed and are negative.  EKGs/Labs/Other Studies Reviewed:    The following studies  were reviewed today:   EKG:  EKG not ordered today.   Recent Labs: 01/27/2021: BNP 64.5 03/31/2021: ALT 8; BUN 31; Creatinine, Ser 1.37; Hemoglobin 12.5; Platelets 246; Potassium 4.6; Sodium 139; TSH 3.720  Recent Lipid Panel    Component Value Date/Time   CHOL 198 03/31/2021 1435   TRIG 135 03/31/2021 1435   HDL 70 03/31/2021 1435   CHOLHDL 3.1 06/22/2018 0840   LDLCALC 105 (H) 03/31/2021 1435     Risk Assessment/Calculations:      Physical Exam:    VS:  BP 118/68   Pulse 88   Ht 5' (1.524 m)   Wt 247 lb (112 kg)  BMI 48.24 kg/m     Wt Readings from Last 3 Encounters:  05/22/21 247 lb (112 kg)  03/31/21 243 lb 12.8 oz (110.6 kg)  03/26/21 241 lb 3.2 oz (109.4 kg)     GEN:  Well nourished, well developed in no acute distress HEENT: Normal NECK: Unable to assess JVD due to body habitus LYMPHATICS: No lymphadenopathy CARDIAC: RRR, no murmurs, rubs, gallops RESPIRATORY: Decreased breath sounds at bases ABDOMEN: Soft, non-tender, non-distended MUSCULOSKELETAL:  2+ edema; No deformity  SKIN: Warm and dry NEUROLOGIC:  Alert and oriented x 3 PSYCHIATRIC:  Normal affect   ASSESSMENT:    1. Leg edema   2. Primary hypertension   3. Morbid obesity ([place])     PLAN:    In order of problems listed above:  Bilateral lower extremity edema, echo with normal systolic function, impaired relaxation.  Edema is likely from morbid obesity.  Continue torsemide 40 mg daily potassium.  Monitor kidney levels. Hypertension, BP controlled.  But patient has fatigue, diarrhea which is present in 3% of the time with irbesartan. Stop irbesartan, start hydralazine 50 mg 3 times daily. Morbid obesity, weight loss advised.  Follow-up in 6 weeks     Medication Adjustments/Labs and Tests Ordered: Current medicines are reviewed at length with the patient today.  Concerns regarding medicines are outlined above.  No orders of the defined types were placed in this encounter.  Meds ordered  this encounter  Medications   hydrALAZINE (APRESOLINE) 50 MG tablet    Sig: Take 1 tablet (50 mg total) by mouth 3 (three) times daily.    Dispense:  90 tablet    Refill:  3     Patient Instructions  Medication Instructions:   Your physician has recommended you make the following change in your medication:    STOP taking your Irbesartan.  2.    START taking Hydralazine 50 MG three times a day.   *If you need a refill on your cardiac medications before your next appointment, please call your pharmacy*   Lab Work: None ordered If you have labs (blood work) drawn today and your tests are completely normal, you will receive your results only by: [place] (if you have MyChart) OR A paper copy in the mail If you have any lab test that is abnormal or we need to change your treatment, we will call you to review the results.   Testing/Procedures: None ordered   Follow-Up: At [facility], you and your health needs are our priority.  As part of our continuing mission to provide you with exceptional heart care, we have created designated Provider Care Teams.  These Care Teams include your primary Cardiologist (physician) and Advanced Practice Providers (APPs -  Physician Assistants and Nurse Practitioners) who all work together to provide you with the care you need, when you need it.  We recommend signing up for the patient portal called "MyChart".  Sign up information is provided on this After Visit Summary.  MyChart is used to connect with patients for Virtual Visits (Telemedicine).  Patients are able to view lab/test results, encounter notes, upcoming appointments, etc.  Non-urgent messages can be sent to your provider as well.   To learn more about what you can do with MyChart, go to [email or web address].    Your next appointment:   6 week(s)  The format for your next appointment:   In Person  Provider:   Kate Sable, MD   Other Instructions     Signed, Aaron Edelman  Agbor-Etang, MD  05/22/2021 4:50 PM    [facility]

## 2021-06-22 ENCOUNTER — Telehealth: Payer: Self-pay | Admitting: Family Medicine

## 2021-06-22 MED ORDER — MONTELUKAST SODIUM 10 MG PO TABS: ORAL_TABLET | ORAL | 3 refills | Status: DC

## 2021-06-22 NOTE — Telephone Encounter (Signed)
[facility]aur[facility] faxed refill request for the following medications:   montelukast (SINGULAIR) 10 MG tablet   Please advise.

## 2021-06-24 DIAGNOSIS — N1832 Chronic kidney disease, stage 3b: Secondary | ICD-10-CM | POA: Diagnosis not present

## 2021-06-25 DIAGNOSIS — E785 Hyperlipidemia, unspecified: Secondary | ICD-10-CM | POA: Diagnosis not present

## 2021-06-25 DIAGNOSIS — R809 Proteinuria, unspecified: Secondary | ICD-10-CM | POA: Insufficient documentation

## 2021-06-25 DIAGNOSIS — I509 Heart failure, unspecified: Secondary | ICD-10-CM | POA: Diagnosis not present

## 2021-06-25 DIAGNOSIS — R609 Edema, unspecified: Secondary | ICD-10-CM | POA: Insufficient documentation

## 2021-06-25 DIAGNOSIS — N1832 Chronic kidney disease, stage 3b: Secondary | ICD-10-CM | POA: Diagnosis not present

## 2021-06-25 DIAGNOSIS — I10 Essential (primary) hypertension: Secondary | ICD-10-CM | POA: Diagnosis not present

## 2021-06-29 ENCOUNTER — Ambulatory Visit: Payer: Medicare Other | Admitting: Cardiology

## 2021-07-17 ENCOUNTER — Ambulatory Visit: Payer: Self-pay

## 2021-07-17 NOTE — Telephone Encounter (Signed)
Pt. Reports her tramadol makes her "extremely sleepy" and wants to cut it in half. States she will check with her pharmacist as well. It does help her pain.Wants to know what her PCP recommends.   Answer Assessment - Initial Assessment Questions 1. NAME of MEDICATION: "What medicine are you calling about?"     Tramadol 2. QUESTION: "What is your question?" (e.g., double dose of medicine, side effect)     Can she cut it in have 3. PRESCRIBING HCP: "Who prescribed it?" Reason: if prescribed by specialist, call should be referred to that group.     Dr. Jacinto Reap 4. SYMPTOMS: "Do you have any symptoms?"     sleepy 5. SEVERITY: If symptoms are present, ask "Are they mild, moderate or severe?"     Moderate 6. PREGNANCY:  "Is there any chance that you are pregnant?" "When was your last menstrual period?"     No  Protocols used: Medication Question Call-A-AH

## 2021-07-17 NOTE — Telephone Encounter (Signed)
Please review and advise if okay to cut pill in half due to fatigue, KW

## 2021-07-17 NOTE — Telephone Encounter (Signed)
Message from Lennox Solders sent at 07/17/2021 10:49 AM EDT  Pt is calling and would like to know if she can take 1/2 of 50 mg of tramadol to due it puts her on her butt. Please advise     Call History   Type Contact Phone/Fax User  07/17/2021 10:48 AM EDT Phone (Incoming) Agrawal, [name] "Gloria Mercer" (Self) [phone] Lemmie Evens) Lennox Solders

## 2021-07-20 NOTE — Telephone Encounter (Signed)
Pt. Given message, verbalizes understanding. 

## 2021-07-20 NOTE — Telephone Encounter (Signed)
Okay for [facility] nurse triage, to advise patient of Daneil Dan message below in regards to tramadol. KW

## 2021-07-20 NOTE — Telephone Encounter (Signed)
Lmtcb, I looked back at [facility] and medication was last prescribed on 01/27/21 PRN. KW

## 2021-07-27 ENCOUNTER — Other Ambulatory Visit: Payer: Self-pay | Admitting: *Deleted

## 2021-07-28 NOTE — Telephone Encounter (Signed)
LMOVM to schedule appt

## 2021-08-05 ENCOUNTER — Other Ambulatory Visit: Payer: Self-pay | Admitting: Physician Assistant

## 2021-08-05 DIAGNOSIS — E7800 Pure hypercholesterolemia, unspecified: Secondary | ICD-10-CM

## 2021-08-05 MED ORDER — TORSEMIDE 20 MG PO TABS: 40.0000 mg | ORAL_TABLET | Freq: Every day | ORAL | 0 refills | Status: DC

## 2021-08-05 NOTE — Telephone Encounter (Signed)
Pt called stating that she is in need of this refilled. She states that she only has 1-2 left. Please advise.

## 2021-08-06 MED ORDER — EZETIMIBE 10 MG PO TABS: ORAL_TABLET | ORAL | 3 refills | Status: DC

## 2021-08-06 NOTE — Addendum Note (Signed)
Addended by: Shawna Orleans on: 08/06/2021 10:06 AM   Modules accepted: Orders

## 2021-08-10 DIAGNOSIS — H2513 Age-related nuclear cataract, bilateral: Secondary | ICD-10-CM | POA: Diagnosis not present

## 2021-08-10 DIAGNOSIS — H1840 Unspecified corneal degeneration: Secondary | ICD-10-CM | POA: Diagnosis not present

## 2021-08-10 DIAGNOSIS — H02834 Dermatochalasis of left upper eyelid: Secondary | ICD-10-CM | POA: Diagnosis not present

## 2021-08-10 DIAGNOSIS — H16143 Punctate keratitis, bilateral: Secondary | ICD-10-CM | POA: Diagnosis not present

## 2021-08-10 DIAGNOSIS — H353122 Nonexudative age-related macular degeneration, left eye, intermediate dry stage: Secondary | ICD-10-CM | POA: Diagnosis not present

## 2021-08-10 DIAGNOSIS — H02831 Dermatochalasis of right upper eyelid: Secondary | ICD-10-CM | POA: Diagnosis not present

## 2021-08-10 DIAGNOSIS — H353212 Exudative age-related macular degeneration, right eye, with inactive choroidal neovascularization: Secondary | ICD-10-CM | POA: Diagnosis not present

## 2021-08-13 ENCOUNTER — Other Ambulatory Visit (HOSPITAL_COMMUNITY): Payer: Self-pay | Admitting: Nephrology

## 2021-08-13 ENCOUNTER — Other Ambulatory Visit: Payer: Self-pay | Admitting: Nephrology

## 2021-08-13 DIAGNOSIS — I10 Essential (primary) hypertension: Secondary | ICD-10-CM

## 2021-08-13 DIAGNOSIS — R600 Localized edema: Secondary | ICD-10-CM

## 2021-08-13 DIAGNOSIS — N1832 Chronic kidney disease, stage 3b: Secondary | ICD-10-CM

## 2021-08-13 DIAGNOSIS — H353122 Nonexudative age-related macular degeneration, left eye, intermediate dry stage: Secondary | ICD-10-CM | POA: Diagnosis not present

## 2021-08-13 DIAGNOSIS — E785 Hyperlipidemia, unspecified: Secondary | ICD-10-CM

## 2021-08-13 DIAGNOSIS — R809 Proteinuria, unspecified: Secondary | ICD-10-CM

## 2021-08-13 DIAGNOSIS — I509 Heart failure, unspecified: Secondary | ICD-10-CM

## 2021-08-13 DIAGNOSIS — H25813 Combined forms of age-related cataract, bilateral: Secondary | ICD-10-CM | POA: Diagnosis not present

## 2021-08-13 DIAGNOSIS — H353212 Exudative age-related macular degeneration, right eye, with inactive choroidal neovascularization: Secondary | ICD-10-CM | POA: Diagnosis not present

## 2021-08-17 ENCOUNTER — Ambulatory Visit: Payer: Medicare Other

## 2021-08-24 ENCOUNTER — Ambulatory Visit
Admission: RE | Admit: 2021-08-24 | Discharge: 2021-08-24 | Disposition: A | Discharge status: Home / Self Care | Payer: Medicare Other | Source: Ambulatory Visit | Attending: Nephrology | Admitting: Nephrology

## 2021-08-24 ENCOUNTER — Other Ambulatory Visit: Payer: Self-pay

## 2021-08-24 ENCOUNTER — Telehealth: Payer: Self-pay | Admitting: Cardiology

## 2021-08-24 DIAGNOSIS — R809 Proteinuria, unspecified: Secondary | ICD-10-CM | POA: Diagnosis not present

## 2021-08-24 DIAGNOSIS — N1832 Chronic kidney disease, stage 3b: Secondary | ICD-10-CM | POA: Diagnosis not present

## 2021-08-24 DIAGNOSIS — I10 Essential (primary) hypertension: Secondary | ICD-10-CM | POA: Insufficient documentation

## 2021-08-24 DIAGNOSIS — E785 Hyperlipidemia, unspecified: Secondary | ICD-10-CM | POA: Diagnosis not present

## 2021-08-24 DIAGNOSIS — I509 Heart failure, unspecified: Secondary | ICD-10-CM | POA: Insufficient documentation

## 2021-08-24 DIAGNOSIS — R600 Localized edema: Secondary | ICD-10-CM | POA: Insufficient documentation

## 2021-08-24 DIAGNOSIS — N189 Chronic kidney disease, unspecified: Secondary | ICD-10-CM | POA: Diagnosis not present

## 2021-08-24 MED ORDER — TORSEMIDE 20 MG PO TABS: 40.0000 mg | ORAL_TABLET | Freq: Every day | ORAL | 0 refills | Status: DC

## 2021-08-24 NOTE — Telephone Encounter (Signed)
Called patient back and she informed me that she has been out of her Torsemide and her feet are swollen.  I refilled patients Torsemide for 30 days and moved up her overdue 6 week follow up to next week. Patient was originally supposed to follow up in early August but missed that appointment. Patient was grateful for the call and will be here next week on 08/31/21 at 1:40.

## 2021-08-24 NOTE — Telephone Encounter (Signed)
*  STAT* If patient is at the pharmacy, call can be transferred to refill team.   1. Which medications need to be refilled? (please list name of each medication and dose if known) TORSEMIDE  2. Which pharmacy/location (including street and city if local pharmacy) is medication to be sent to? [place]  3. Do they need a 30 day or 90 day supply? [place]

## 2021-08-24 NOTE — Telephone Encounter (Signed)
Pt c/o swelling: STAT is pt has developed SOB within 24 hours  If swelling, where is the swelling located? BILATERAL FEET  How much weight have you gained and in what time span? NO  Have you gained 3 pounds in a day or 5 pounds in a week?  NO  Do you have a log of your daily weights (if so, list)? no Are you taking a fluid pill? Not sure  Are you currently SOB? No Have you traveled recently? No  Patient has been with out her Torsemide and states her BP is eleavated . BP last night 146/71

## 2021-08-28 ENCOUNTER — Encounter: Payer: Self-pay | Admitting: Cardiology

## 2021-08-28 ENCOUNTER — Other Ambulatory Visit: Payer: Self-pay

## 2021-08-28 ENCOUNTER — Ambulatory Visit (INDEPENDENT_AMBULATORY_CARE_PROVIDER_SITE_OTHER): Payer: Medicare Other | Admitting: Cardiology

## 2021-08-28 VITALS — BP 134/78 | HR 83 | Ht 60.0 in | Wt 238.0 lb

## 2021-08-28 DIAGNOSIS — R600 Localized edema: Secondary | ICD-10-CM | POA: Diagnosis not present

## 2021-08-28 DIAGNOSIS — I10 Essential (primary) hypertension: Secondary | ICD-10-CM

## 2021-08-28 NOTE — Progress Notes (Signed)
Cardiology Office Note:    Date:  08/28/2021   ID:  [name], DOB October 03, 1941, MRN [id number]  PCP:  Virginia Crews, MD   [place]  Cardiologist:  Kate Sable, MD  Advanced Practice Provider:  No care team member to display Electrophysiologist:  None       Referring MD: Virginia Crews, MD   Chief Complaint  Patient presents with   Follow-up     History of Present Illness:    Gloria Mercer is a 80 y.o. female with a hx of hypertension, obesity presents for follow-up.   Being seen for hypertension, and edema.  Previously was started on irbesartan, complaining of diarrhea, irbesartan  was stopped and hydralazine started.  Given torsemide for edema.  States missing several doses of torsemide earlier this week due to running out of was not able to call in for refills.  Saw nephrology last week, had a renal ultrasound performed, plans for follow-up next week.  Blood pressures have been better at home with systolics in the 938H.  Diarrhea previously noted while taking ARB is much improved.   Prior notes Echocardiogram 07/27/9370 normal systolic function, impaired relaxation, EF 55 to 60%.    Past Medical History:  Diagnosis Date   Allergic rhinitis    on allergy shots, sees Dr. Pryor Ochoa   Cataract    GERD (gastroesophageal reflux disease)    Hypertension    Macular degeneration    follow[facility] by Dr Garwin Brothers at [facility]   Osteoarthritis    Sciatica     Past Surgical History:  Procedure Laterality Date   CHOLECYSTECTOMY  2002   TUBAL LIGATION  1978    Current Medications: Current Meds  Medication Sig   azelastine (ASTELIN) 0.1 % nasal spray Place 2 sprays into both nostrils daily.   Cholecalciferol (VITAMIN D3) 5000 units CAPS Take 1 capsule by mouth daily.   clotrimazole-betamethasone (LOTRISONE) cream APPLY EXTERNALLY TO THE AFFECTED AREA TWICE DAILY AS NEEDED   esomeprazole (NEXIUM) 20 MG capsule Take 40 mg by mouth  daily at 12 noon.    ezetimibe (ZETIA) 10 MG tablet TAKE 1 TABLET(10 MG) BY MOUTH DAILY   ipratropium (ATROVENT) 0.03 % nasal spray Place 2 sprays into both nostrils every 8 (eight) hours as needed.   levocetirizine (XYZAL) 5 MG tablet Take 5 mg by mouth every evening.   montelukast (SINGULAIR) 10 MG tablet TAKE 1 TABLET(10 MG) BY MOUTH DAILY   Multiple Vitamins-Minerals (EYE VITAMINS PO) Take 2 tablets by mouth daily.    Omega-3 Fatty Acids (FISH OIL) 1000 MG CAPS Take 1 capsule by mouth daily.   potassium chloride SA (KLOR-CON) 20 MEQ tablet Take 1 tablet (20 mEq total) by mouth daily.   pramipexole (MIRAPEX) 0.5 MG tablet Take 1 tablet (0.5 mg total) by mouth at bedtime.   PREVIDENT 5000 DRY MOUTH 1.1 % GEL dental gel Place 1 application onto teeth. 2-3xs daily   torsemide (DEMADEX) 20 MG tablet Take 2 tablets (40 mg total) by mouth daily.   triamcinolone (NASACORT) 55 MCG/ACT AERO nasal inhaler Place 2 sprays into the nose daily.   triamcinolone ointment (KENALOG) 0.5 % Apply 1 application topically 2 (two) times daily.     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, physiological  Tobacco Use   Smoking status: Former    Packs/day: 1.00    Years: 10.00    Pack years: 10.00    Types: Cigarettes    Quit date: 11/28/1976    Years since quitting: 44.7   Smokeless tobacco: Never  Vaping Use   Vaping Use: Never used  Substance and Sexual Activity   Alcohol use: Not Currently    Comment: rarely, at holidays   Drug use: No   Sexual activity: Not Currently  Other Topics Concern   Not on file  Social History Narrative   Not on  file   Social Determinants of Health   Financial Resource Strain: Low Risk    Difficulty of Paying Living Expenses: Not hard at all  Food Insecurity: No Food Insecurity   Worried About Charity fundraiser in the Last Year: Never true   [place] in the Last Year: Never true  Transportation Needs: No Transportation Needs   Lack of Transportation (Medical): No   Lack of Transportation (Non-Medical): No  Physical Activity: Inactive   Days of Exercise per Week: 0 days   Minutes of Exercise per Session: 0 min  Stress: No Stress Concern Present   Feeling of Stress : Not at all  Social Connections: Moderately Isolated   Frequency of Communication with Friends and Family: More than three times a week   Frequency of Social Gatherings with Friends and Family: More than three times a week   Attends Religious Services: Never   Marine scientist or Organizations: Yes   Attends Music therapist: More than 4 times per year   Marital Status: Divorced     Family History: The patient's family history includes Diabetes in her father; Heart disease (age of onset: 29) in her father; Hypertension in her mother; Myelodysplastic syndrome in her sister; Prostate cancer in her paternal grandfather; Seizures in her mother. There is no history of Breast cancer or Cervical cancer.  ROS:   Please see the history of present illness.     All other systems reviewed and are negative.  EKGs/Labs/Other Studies Reviewed:    The following studies were reviewed today:   EKG:  EKG not ordered today.   Recent Labs: 01/27/2021: BNP 64.5 03/31/2021: ALT 8; BUN 31; Creatinine, Ser 1.37; Hemoglobin 12.5; Platelets 246; Potassium 4.6; Sodium 139; TSH 3.720  Recent Lipid Panel    Component Value Date/Time   CHOL 198 03/31/2021 1435   TRIG 135 03/31/2021 1435   HDL 70 03/31/2021 1435   CHOLHDL 3.1 06/22/2018 0840   LDLCALC 105 (H) 03/31/2021 1435     Risk Assessment/Calculations:       Physical Exam:    VS:  BP 134/78 (BP Location: Left Arm, Patient Position: Sitting, Cuff Size: Large)   Pulse 83   Ht 5' (1.524 m)   Wt 238 lb (108 kg)   SpO2 97%   BMI 46.48 kg/m     Wt Readings from Last 3 Encounters:  08/28/21 238 lb (108 kg)  05/22/21 247 lb (112 kg)  03/31/21 243 lb 12.8 oz (110.6 kg)     GEN:  Well nourished, well developed in no acute distress HEENT: Normal NECK: Unable to assess JVD due to body habitus LYMPHATICS: No lymphadenopathy CARDIAC: RRR, no murmurs, rubs, gallops  RESPIRATORY: Decreased breath sounds at bases ABDOMEN: Soft, non-tender, non-distended MUSCULOSKELETAL:  2+ edema; No deformity  SKIN: Warm and dry NEUROLOGIC:  Alert and oriented x 3 PSYCHIATRIC:  Normal affect   ASSESSMENT:    1. Leg edema   2. Primary hypertension   3. Morbid obesity ([place])      PLAN:    In order of problems listed above:  Bilateral lower extremity edema, echo with normal systolic function, impaired relaxation.  Edema is likely from morbid obesity.  Continue torsemide 40 mg daily .  Consider increase in torsemide after nephrology evaluation.  Hypertension, BP controlled.  Continue hydralazine 50 mg 3 times daily. Morbid obesity, weight loss advised.  Follow-up in 6 months     Medication Adjustments/Labs and Tests Ordered: Current medicines are reviewed at length with the patient today.  Concerns regarding medicines are outlined above.  Orders Placed This Encounter  Procedures   EKG 12-Lead    No orders of the defined types were placed in this encounter.    Patient Instructions  Medication Instructions:   Your physician recommends that you continue on your current medications as directed. Please refer to the Current Medication list given to you today.   *If you need a refill on your cardiac medications before your next appointment, please call your pharmacy*   Lab Work: None ordered If you have labs (blood work) drawn today and  your tests are completely normal, you will receive your results only by: [place] (if you have MyChart) OR A paper copy in the mail If you have any lab test that is abnormal or we need to change your treatment, we will call you to review the results.   Testing/Procedures: None ordered   Follow-Up: At [facility], you and your health needs are our priority.  As part of our continuing mission to provide you with exceptional heart care, we have created designated Provider Care Teams.  These Care Teams include your primary Cardiologist (physician) and Advanced Practice Providers (APPs -  Physician Assistants and Nurse Practitioners) who all work together to provide you with the care you need, when you need it.  We recommend signing up for the patient portal called "MyChart".  Sign up information is provided on this After Visit Summary.  MyChart is used to connect with patients for Virtual Visits (Telemedicine).  Patients are able to view lab/test results, encounter notes, upcoming appointments, etc.  Non-urgent messages can be sent to your provider as well.   To learn more about what you can do with MyChart, go to [email or web address].    Your next appointment:   6 month(s)  The format for your next appointment:   In Person  Provider:   Kate Sable, MD   Other Instructions    Signed, Kate Sable, MD  08/28/2021 3:41 PM    [place]

## 2021-08-28 NOTE — Patient Instructions (Signed)

## 2021-08-31 ENCOUNTER — Ambulatory Visit: Payer: Medicare Other | Admitting: Cardiology

## 2021-09-02 DIAGNOSIS — Z01818 Encounter for other preprocedural examination: Secondary | ICD-10-CM | POA: Insufficient documentation

## 2021-09-02 DIAGNOSIS — I10 Essential (primary) hypertension: Secondary | ICD-10-CM | POA: Diagnosis not present

## 2021-09-02 DIAGNOSIS — H2513 Age-related nuclear cataract, bilateral: Secondary | ICD-10-CM | POA: Diagnosis not present

## 2021-09-02 DIAGNOSIS — G4730 Sleep apnea, unspecified: Secondary | ICD-10-CM | POA: Diagnosis not present

## 2021-09-02 DIAGNOSIS — K219 Gastro-esophageal reflux disease without esophagitis: Secondary | ICD-10-CM | POA: Diagnosis not present

## 2021-09-02 DIAGNOSIS — R7303 Prediabetes: Secondary | ICD-10-CM | POA: Diagnosis not present

## 2021-09-02 DIAGNOSIS — N183 Chronic kidney disease, stage 3 unspecified: Secondary | ICD-10-CM | POA: Insufficient documentation

## 2021-09-02 DIAGNOSIS — G2581 Restless legs syndrome: Secondary | ICD-10-CM | POA: Diagnosis not present

## 2021-09-02 DIAGNOSIS — I119 Hypertensive heart disease without heart failure: Secondary | ICD-10-CM | POA: Diagnosis not present

## 2021-09-03 DIAGNOSIS — R609 Edema, unspecified: Secondary | ICD-10-CM | POA: Diagnosis not present

## 2021-09-03 DIAGNOSIS — I10 Essential (primary) hypertension: Secondary | ICD-10-CM | POA: Diagnosis not present

## 2021-09-03 DIAGNOSIS — N1832 Chronic kidney disease, stage 3b: Secondary | ICD-10-CM | POA: Diagnosis not present

## 2021-09-03 DIAGNOSIS — I509 Heart failure, unspecified: Secondary | ICD-10-CM | POA: Diagnosis not present

## 2021-09-03 DIAGNOSIS — R809 Proteinuria, unspecified: Secondary | ICD-10-CM | POA: Diagnosis not present

## 2021-09-03 DIAGNOSIS — D510 Vitamin B12 deficiency anemia due to intrinsic factor deficiency: Secondary | ICD-10-CM | POA: Diagnosis not present

## 2021-09-03 DIAGNOSIS — E785 Hyperlipidemia, unspecified: Secondary | ICD-10-CM | POA: Diagnosis not present

## 2021-09-03 DIAGNOSIS — E559 Vitamin D deficiency, unspecified: Secondary | ICD-10-CM | POA: Diagnosis not present

## 2021-09-04 ENCOUNTER — Ambulatory Visit: Payer: Medicare Other | Admitting: Cardiology

## 2021-09-07 DIAGNOSIS — I5032 Chronic diastolic (congestive) heart failure: Secondary | ICD-10-CM | POA: Diagnosis not present

## 2021-09-07 DIAGNOSIS — Z961 Presence of intraocular lens: Secondary | ICD-10-CM | POA: Insufficient documentation

## 2021-09-07 DIAGNOSIS — I130 Hypertensive heart and chronic kidney disease with heart failure and stage 1 through stage 4 chronic kidney disease, or unspecified chronic kidney disease: Secondary | ICD-10-CM | POA: Diagnosis not present

## 2021-09-07 DIAGNOSIS — H25811 Combined forms of age-related cataract, right eye: Secondary | ICD-10-CM | POA: Diagnosis not present

## 2021-09-07 DIAGNOSIS — N183 Chronic kidney disease, stage 3 unspecified: Secondary | ICD-10-CM | POA: Diagnosis not present

## 2021-09-10 ENCOUNTER — Telehealth: Payer: Self-pay | Admitting: Cardiology

## 2021-09-10 NOTE — Telephone Encounter (Signed)
*  STAT* If patient is at the pharmacy, call can be transferred to refill team.   1. Which medications need to be refilled? (please list name of each medication and dose if known)   Torsemide 40 mg po q d   2. Which pharmacy/location (including street and city if local pharmacy) is medication to be sent to? [facility] [address]  3. Do they need a 30 day or 90 day supply? [place]

## 2021-09-11 MED ORDER — TORSEMIDE 20 MG PO TABS
40.0000 mg | ORAL_TABLET | Freq: Every day | ORAL | 1 refills | Status: DC
Start: 2021-09-11 — End: 2022-03-18

## 2021-09-11 NOTE — Telephone Encounter (Signed)
Requested Prescriptions   Signed Prescriptions Disp Refills   torsemide (DEMADEX) 20 MG tablet 180 tablet 1    Sig: Take 2 tablets (40 mg total) by mouth daily.    Authorizing Provider: Kate Sable    Ordering User: Raelene Bott, Kenrick Pore L

## 2021-09-16 ENCOUNTER — Ambulatory Visit: Payer: Self-pay

## 2021-09-16 NOTE — Telephone Encounter (Signed)
Patient called in says having sciatica symptoms, pain in left hip and down her leg, she wants to know if she can have salonpas patch sent for her at   [place], [place] [address]  Phone: [phone]  Fax: [phone]  Pt. Asking if she needs a prescription for Salonpas patches. Instructed these are OTC. Verbalizes understanding. States if "they don't work, I'll call back."    Answer Assessment - Initial Assessment Questions 1. NAME of MEDICATION: "What medicine are you calling about?"     Salonpas patches 2. QUESTION: "What is your question?" (e.g., double dose of medicine, side effect)     Do I need a prescription? 3. PRESCRIBING HCP: "Who prescribed it?" Reason: if prescribed by specialist, call should be referred to that group.     N/a 4. SYMPTOMS: "Do you have any symptoms?"     N/a 5. SEVERITY: If symptoms are present, ask "Are they mild, moderate or severe?"     N/a 6. PREGNANCY:  "Is there any chance that you are pregnant?" "When was your last menstrual period?"     No  Protocols used: Medication Question Call-A-AH

## 2021-09-16 NOTE — Telephone Encounter (Signed)
Noted  

## 2021-09-21 DIAGNOSIS — I130 Hypertensive heart and chronic kidney disease with heart failure and stage 1 through stage 4 chronic kidney disease, or unspecified chronic kidney disease: Secondary | ICD-10-CM | POA: Diagnosis not present

## 2021-09-21 DIAGNOSIS — I5022 Chronic systolic (congestive) heart failure: Secondary | ICD-10-CM | POA: Diagnosis not present

## 2021-09-21 DIAGNOSIS — N183 Chronic kidney disease, stage 3 unspecified: Secondary | ICD-10-CM | POA: Diagnosis not present

## 2021-09-21 DIAGNOSIS — H25812 Combined forms of age-related cataract, left eye: Secondary | ICD-10-CM | POA: Diagnosis not present

## 2021-10-01 ENCOUNTER — Telehealth: Payer: Self-pay

## 2021-10-01 NOTE — Telephone Encounter (Signed)
Copied from [place] [id number]. Topic: Appointment Scheduling - Scheduling Inquiry for Clinic >> Oct 01, 2021 12:47 PM Greggory Keen D wrote: Reason for CRM: Pt called saying she would like to see someone preferably Dr. B for sciatica pain.  She ask also if Dr. B could call her. CB#  [phone]

## 2021-10-01 NOTE — Telephone Encounter (Signed)
Patient scheduled for 12-12-20 

## 2021-10-02 ENCOUNTER — Encounter: Payer: Self-pay | Admitting: Physician Assistant

## 2021-10-02 ENCOUNTER — Other Ambulatory Visit: Payer: Self-pay | Admitting: Physician Assistant

## 2021-10-02 ENCOUNTER — Other Ambulatory Visit: Payer: Self-pay

## 2021-10-02 ENCOUNTER — Ambulatory Visit (INDEPENDENT_AMBULATORY_CARE_PROVIDER_SITE_OTHER): Payer: Medicare Other | Admitting: Physician Assistant

## 2021-10-02 VITALS — BP 131/58 | HR 84 | Temp 97.9°F | Resp 18 | Ht 60.0 in | Wt 239.7 lb

## 2021-10-02 DIAGNOSIS — J3089 Other allergic rhinitis: Secondary | ICD-10-CM | POA: Diagnosis not present

## 2021-10-02 DIAGNOSIS — M79605 Pain in left leg: Secondary | ICD-10-CM

## 2021-10-02 DIAGNOSIS — Z23 Encounter for immunization: Secondary | ICD-10-CM

## 2021-10-02 DIAGNOSIS — M5432 Sciatica, left side: Secondary | ICD-10-CM

## 2021-10-02 MED ORDER — AZELASTINE HCL 0.1 % NA SOLN: 2.0000 | Freq: Every day | NASAL | 11 refills | Status: DC

## 2021-10-02 MED ORDER — IPRATROPIUM BROMIDE 0.03 % NA SOLN: 2.0000 | Freq: Three times a day (TID) | NASAL | 0 refills | Status: DC | PRN

## 2021-10-02 MED ORDER — PREGABALIN 25 MG PO CAPS: 25.0000 mg | ORAL_CAPSULE | Freq: Two times a day (BID) | ORAL | 0 refills | Status: DC | PRN

## 2021-10-02 NOTE — Progress Notes (Signed)
Established patient visit   Patient: Gloria Mercer   DOB: 10-07-1941   80 y.o. Female  MRN: [id number] Visit Date: 10/02/2021  Today's healthcare provider: [name], PA-C  Chief Complaint  Patient presents with   Leg Pain   Subjective     Taunja is an 80 y/o female with a history of CHF and stage III CKD who presents today with left buttock/hip and left leg pain x2 3 weeks.  She describes it as an aching pain occasionally stabbing depending on her position. The pain is worse at night. She has had sciatica on the right side previously this feels very similar to her.  She has tried light stretching and heat with only mild improvement.  Denies any loss of sensation, numbness or tingling.  Denies any injury to her left leg or hip.  Denies any changes with her medications.  She states the swelling in her legs and feet changes on a daily basis today is worse than yesterday.   Medications: Outpatient Medications Prior to Visit  Medication Sig   Cholecalciferol (VITAMIN D3) 5000 units CAPS Take 1 capsule by mouth daily.   clotrimazole-betamethasone (LOTRISONE) cream APPLY EXTERNALLY TO THE AFFECTED AREA TWICE DAILY AS NEEDED   esomeprazole (NEXIUM) 20 MG capsule Take 40 mg by mouth daily at 12 noon.    ezetimibe (ZETIA) 10 MG tablet TAKE 1 TABLET(10 MG) BY MOUTH DAILY   levocetirizine (XYZAL) 5 MG tablet Take 5 mg by mouth every evening.   montelukast (SINGULAIR) 10 MG tablet TAKE 1 TABLET(10 MG) BY MOUTH DAILY   Multiple Vitamins-Minerals (EYE VITAMINS PO) Take 2 tablets by mouth daily.    Omega-3 Fatty Acids (FISH OIL) 1000 MG CAPS Take 1 capsule by mouth daily.   potassium chloride SA (KLOR-CON) 20 MEQ tablet Take 1 tablet (20 mEq total) by mouth daily.   pramipexole (MIRAPEX) 0.5 MG tablet Take 1 tablet (0.5 mg total) by mouth at bedtime.   torsemide (DEMADEX) 20 MG tablet Take 2 tablets (40 mg total) by mouth daily.   triamcinolone (NASACORT) 55 MCG/ACT AERO nasal inhaler  Place 2 sprays into the nose daily.   triamcinolone ointment (KENALOG) 0.5 % Apply 1 application topically 2 (two) times daily.   [DISCONTINUED] azelastine (ASTELIN) 0.1 % nasal spray Place 2 sprays into both nostrils daily.   [DISCONTINUED] ipratropium (ATROVENT) 0.03 % nasal spray Place 2 sprays into both nostrils every 8 (eight) hours as needed.   hydrALAZINE (APRESOLINE) 50 MG tablet Take 1 tablet (50 mg total) by mouth 3 (three) times daily.   hydrALAZINE (APRESOLINE) 50 MG tablet Take by mouth.   [DISCONTINUED] PREVIDENT 5000 DRY MOUTH 1.1 % GEL dental gel Place 1 application onto teeth. 2-3xs daily (Patient not taking: Reported on 10/02/2021)   No facility-administered medications prior to visit.    Review of Systems  Constitutional:  Positive for fatigue. Negative for fever.  Musculoskeletal:  Positive for arthralgias and gait problem.  Neurological:  Negative for weakness and numbness.   CMP Latest Ref Rng & Units 03/31/2021  Creatinine 0.57 - 1.00 mg/dL 1.37 (H)    Ref. Range 03/31/2021 14:35  eGFR Latest Ref Range: >59 mL/min/1.73 39 (L)   Most recent labs done at [facility] 09/03/21 Creatinine 1.04 and eGFR 54    Objective    BP (!) 131/58 (BP Location: Left Wrist, Patient Position: Sitting, Cuff Size: Large)   Pulse 84   Temp 97.9 F (36.6 C) (Oral)   Resp  18   Ht 5' (1.524 m)   Wt 239 lb 11.2 oz (108.7 kg)   SpO2 98%   BMI 46.81 kg/m    Physical Exam Constitutional:      General: She is awake.     Appearance: She is well-developed.  HENT:     Head: Normocephalic.  Eyes:     Conjunctiva/sclera: Conjunctivae normal.  Cardiovascular:     Rate and Rhythm: Normal rate and regular rhythm.     Pulses:          Dorsalis pedis pulses are 1+ on the right side and 1+ on the left side.       Posterior tibial pulses are 2+ on the right side and 2+ on the left side.     Heart sounds: Normal heart sounds.  Pulmonary:     Effort: Pulmonary effort is normal.     Breath  sounds: Normal breath sounds.  Musculoskeletal:     Right lower leg: 1+ Pitting Edema present.     Left lower leg: 1+ Pitting Edema present.     Right ankle: Swelling present.     Left ankle: Swelling present.     Right foot: Normal range of motion.     Left foot: Normal range of motion.  Feet:     Right foot:     Skin integrity: Warmth present.     Left foot:     Skin integrity: Warmth present.     Comments: B/l edema. Skin:    General: Skin is warm.  Neurological:     Mental Status: She is alert and oriented to person, place, and time.  Psychiatric:        Attention and Perception: Attention normal.        Mood and Affect: Mood normal.        Speech: Speech normal.        Behavior: Behavior is cooperative.      No results found for any visits on 10/02/21.  Assessment & Plan    Left sciatica  We discussed the limitations of medication she can take d/t her stage 3 CKD--although recent labs show improvement.  She is well aware not to take NSAIDS. We discussed trying a low dose of lyrica--she has tried gabapentin 300 mg before but it felt 'too strong'. Low dose gabapentin not covered by her insurance. I advised her to try Lyrica 25 mg PO BID PRN, to try to take it before bed at first as her pain seems to be worse at night. I also explained if this isnt enough we are able to increase the dose.  I demonstrated a few stretches she can try while sitting to relief some pressure/pain. She will continue with heat/ice.  Refills ordered below. Allergic rhinitis stable w/ nasal sprays. Problem List Items Addressed This Visit       Respiratory   Allergic rhinitis   Relevant Medications   azelastine (ASTELIN) 0.1 % nasal spray   ipratropium (ATROVENT) 0.03 % nasal spray   Other Visit Diagnoses     Left sided sciatica    -  Primary   Relevant Medications   pregabalin (LYRICA) 25 MG capsule   Need for influenza vaccination       Relevant Orders   Flu Vaccine QUAD High  Dose(Fluad) (Completed)   Left leg pain       Relevant Medications   pregabalin (LYRICA) 25 MG capsule       F/u as scheduled w/ Dr B  I, [name], PA-C have reviewed all documentation for this visit. The documentation on  10/02/2021  for the exam, diagnosis, procedures, and orders are all accurate and complete.    [name], PA-C  [facility] [phone] (phone) [phone] (fax)  [place]

## 2021-10-05 ENCOUNTER — Telehealth: Payer: Self-pay

## 2021-10-05 NOTE — Telephone Encounter (Signed)
Please review for Ria Comment.   Thanks,   -Mickel Baas

## 2021-10-05 NOTE — Telephone Encounter (Signed)
Copied from [place] [id number]. Topic: General - Other >> Oct 05, 2021  1:31 PM Leward Quan A wrote: Reason for CRM: Patient called in to inform [name] that the pregabalin (LYRICA) 25 MG capsule  put her to sleep for 2 days after taking it asking for something else please. Would like a call back please   Ph# [phone]

## 2021-10-06 ENCOUNTER — Ambulatory Visit: Payer: Self-pay | Admitting: Family Medicine

## 2021-10-06 NOTE — Telephone Encounter (Signed)
lmtcb

## 2021-10-06 NOTE — Telephone Encounter (Signed)
We can try low dose gabapentin 100mg  BID instead. Ok to send in Rx for 30 day supply with 1 refill.

## 2021-10-08 NOTE — Telephone Encounter (Signed)
LMTCB

## 2021-10-09 ENCOUNTER — Other Ambulatory Visit: Payer: Self-pay

## 2021-10-09 MED ORDER — HYDRALAZINE HCL 50 MG PO TABS: 50.0000 mg | ORAL_TABLET | Freq: Three times a day (TID) | ORAL | 3 refills | Status: DC

## 2021-10-09 MED ORDER — GABAPENTIN 100 MG PO CAPS: 100.0000 mg | ORAL_CAPSULE | Freq: Two times a day (BID) | ORAL | 1 refills | Status: DC

## 2021-10-09 NOTE — Telephone Encounter (Signed)
Pt called back to report that she DOES NOT want to take gabapentin, she says she has taken this before and she had severe adverse reactions

## 2021-10-09 NOTE — Telephone Encounter (Signed)
Left message for patient letting her know that I have sent gabapentin to pharmacy. Patient to call back with any questions or concerns.

## 2021-10-14 DIAGNOSIS — Z9842 Cataract extraction status, left eye: Secondary | ICD-10-CM | POA: Diagnosis not present

## 2021-10-14 DIAGNOSIS — Z961 Presence of intraocular lens: Secondary | ICD-10-CM | POA: Diagnosis not present

## 2021-10-19 ENCOUNTER — Telehealth: Payer: Self-pay | Admitting: Family Medicine

## 2021-10-19 NOTE — Telephone Encounter (Signed)
[place] faxed refill request for the following medications:   potassium chloride SA (KLOR-CON) 20 MEQ tablet   Please advise.

## 2021-10-20 ENCOUNTER — Other Ambulatory Visit: Payer: Self-pay

## 2021-10-20 MED ORDER — POTASSIUM CHLORIDE CRYS ER 20 MEQ PO TBCR: 20.0000 meq | EXTENDED_RELEASE_TABLET | Freq: Every day | ORAL | 1 refills | Status: DC

## 2021-10-29 ENCOUNTER — Other Ambulatory Visit: Payer: Self-pay | Admitting: Family Medicine

## 2021-10-29 DIAGNOSIS — E7800 Pure hypercholesterolemia, unspecified: Secondary | ICD-10-CM

## 2021-11-09 DIAGNOSIS — H353212 Exudative age-related macular degeneration, right eye, with inactive choroidal neovascularization: Secondary | ICD-10-CM | POA: Diagnosis not present

## 2021-12-01 ENCOUNTER — Other Ambulatory Visit: Payer: Self-pay | Admitting: Family Medicine

## 2021-12-07 IMAGING — US US RENAL
1 series · 14 of 20 positions shown · non-contrast
Comparison: No recent prior.

CLINICAL DATA: Chronic renal disease.

EXAM:
RENAL / URINARY TRACT ULTRASOUND COMPLETE

[Series 1: us renal · 0.22mm/px · 14 of 20 slices shown]
[im 1/20]
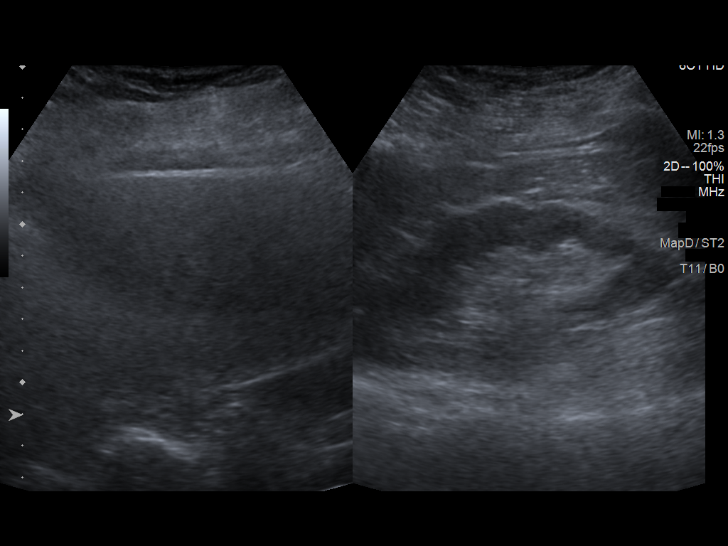
[im 3/20]
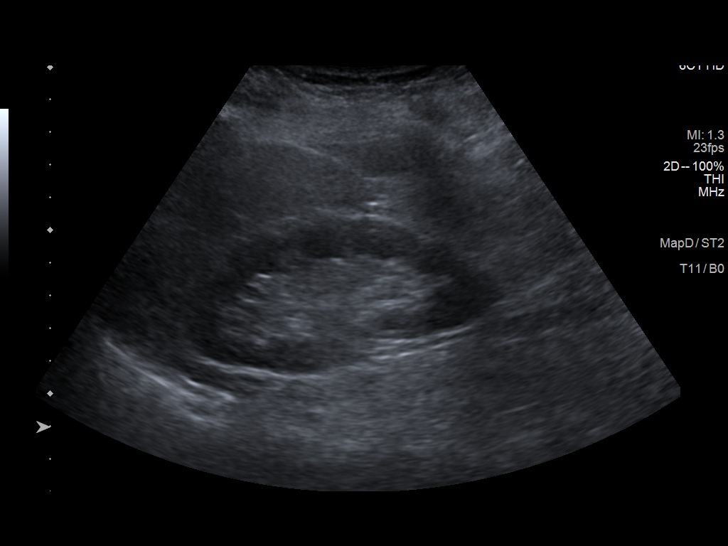
[im 4/20]
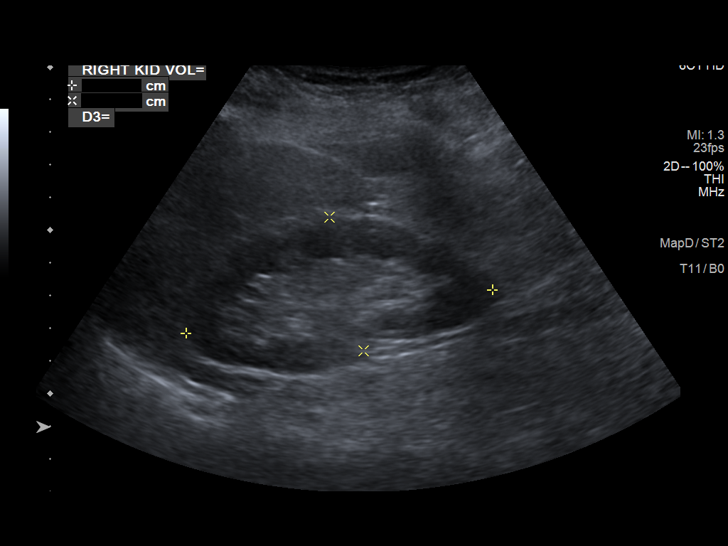
[im 6/20]
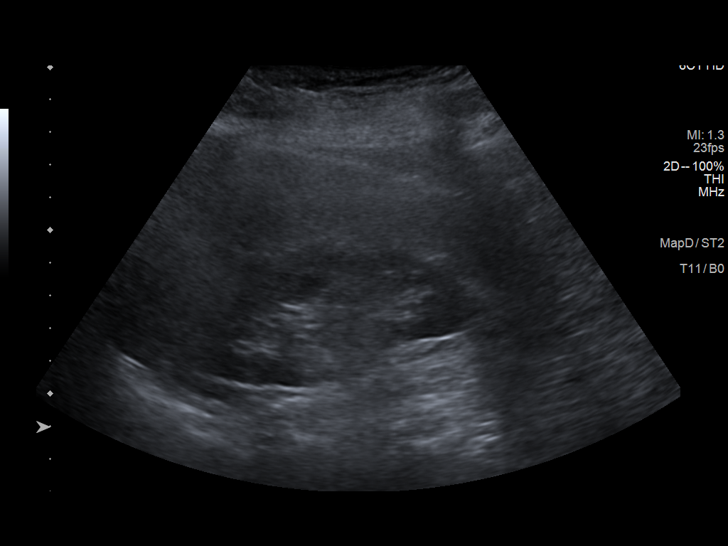
[im 7/20]
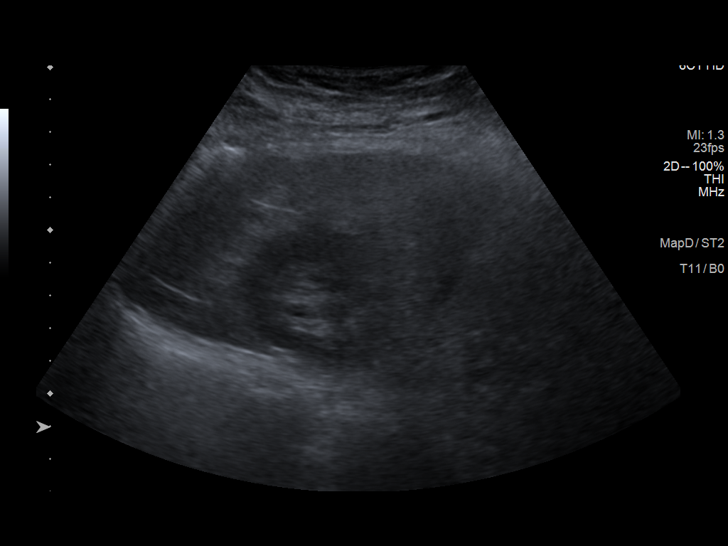
[im 8/20]
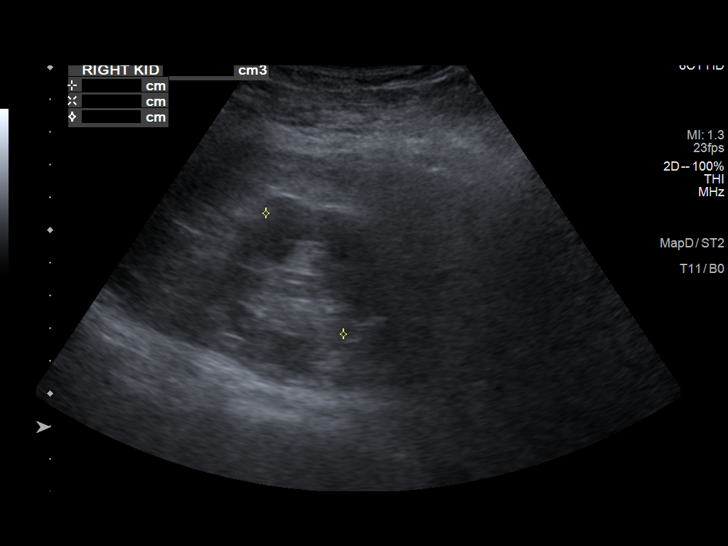
[im 10/20]
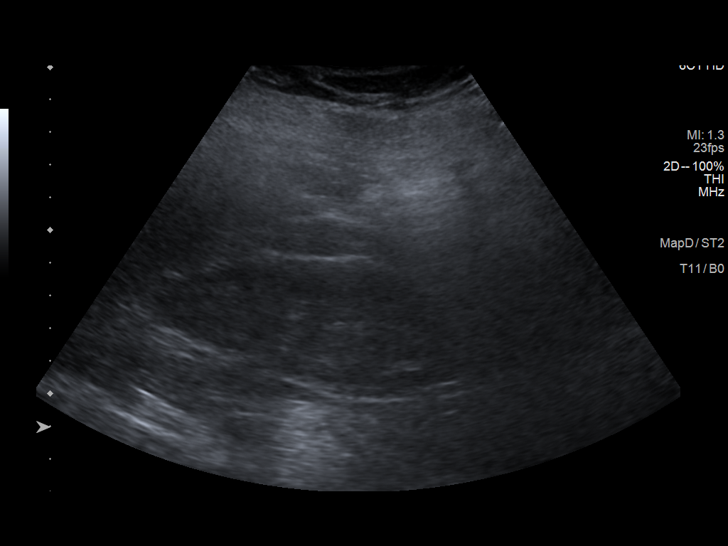
[im 11/20]
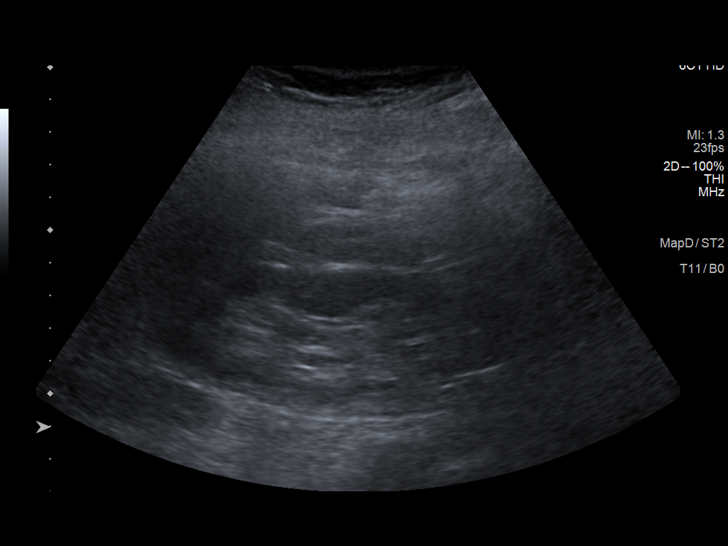
[im 13/20]
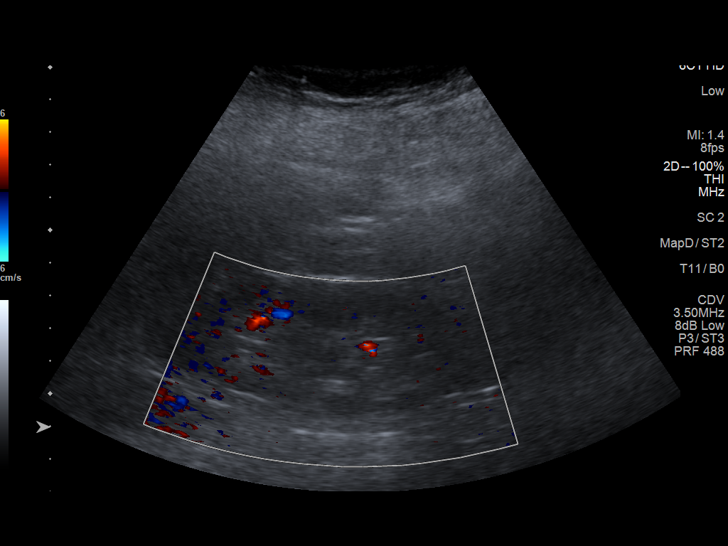
[im 14/20]
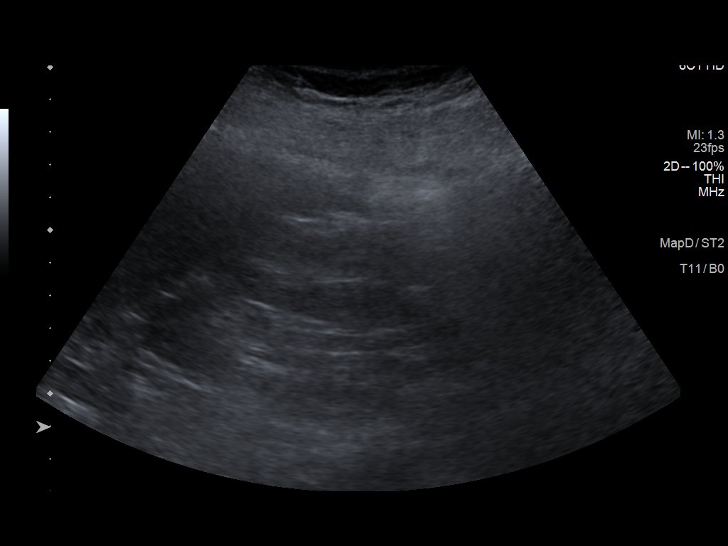
[im 16/20]
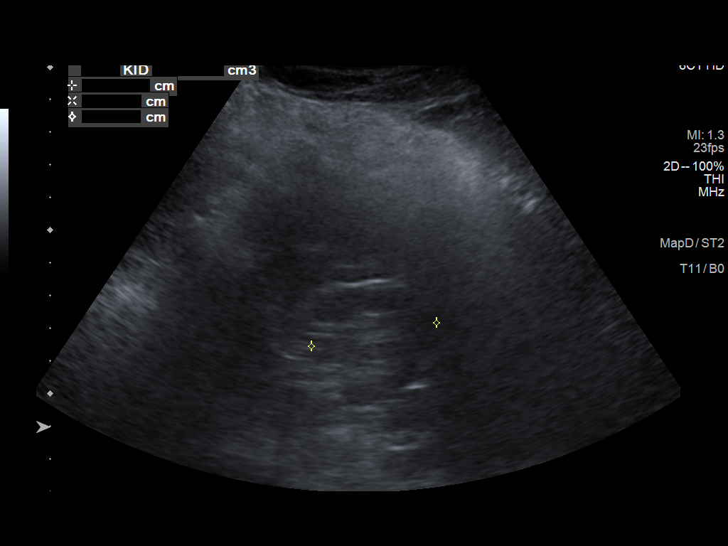
[im 17/20]
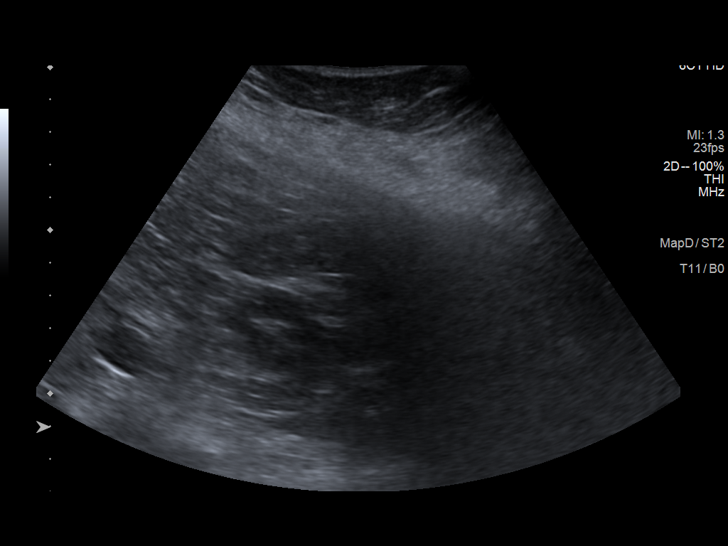
[im 18/20]
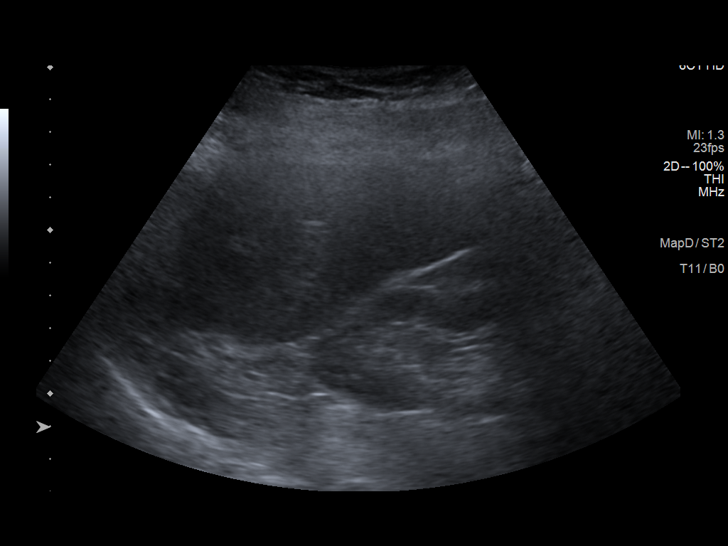
[im 20/20]
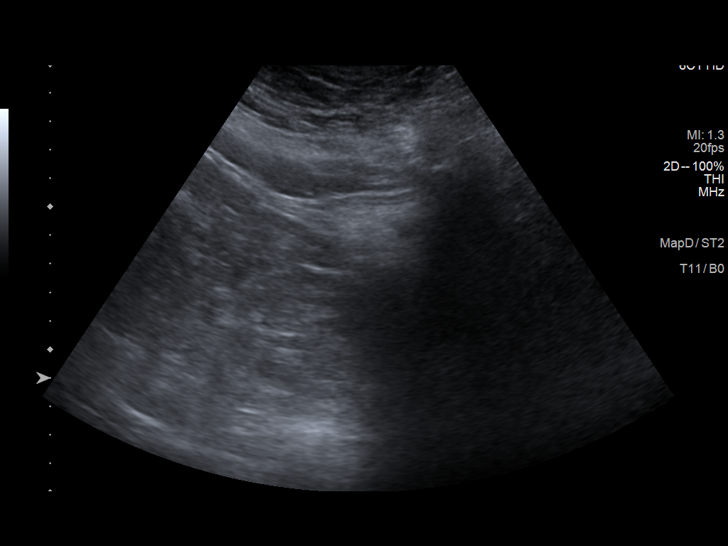

[14 of 20 positions shown; findings below may reference images not displayed]

FINDINGS: Right Kidney:

Renal measurements: 9.5 x 4.2 x 4.4 cm = volume: 92 mL. Mild renal
cortical thinning can not be excluded. Echogenicity within normal
limits. No mass or hydronephrosis visualized.

Left Kidney:

Renal measurements: 10.7 x 4.3 x 3.9 cm = volume: 94 mL. Mild renal
cortical thinning can not be excluded. Echogenicity within normal
limits. No mass or hydronephrosis visualized.

Bladder:

The bladder was empty.

Other:

Limited exam due to patient's body habitus.
IMPRESSION: 1. Mild bilateral renal cortical thinning can not be excluded. Renal
echogenicity with in normal limits. No acute renal abnormality. No
hydronephrosis.

2.  The bladder was empty.

## 2021-12-18 NOTE — Progress Notes (Signed)
Established patient visit   Patient: Gloria Mercer   DOB: December 24, 1940   81 y.o. Female  MRN: [id number] Visit Date: 12/21/2021  Today's healthcare provider: Lavon Paganini, MD   Chief Complaint  Patient presents with   Hypertension   Subjective    HPI  Hypertension, follow-up  BP Readings from Last 3 Encounters:  12/21/21 132/64  10/02/21 (!) 131/58  08/28/21 134/78   Wt Readings from Last 3 Encounters:  12/21/21 235 lb (106.6 kg)  10/02/21 239 lb 11.2 oz (108.7 kg)  08/28/21 238 lb (108 kg)     She was last seen for hypertension  months ago.  BP at that visit was 114/48. Management since that visit includes continue current medication.  She reports good compliance with treatment. She is not having side effects.    Outside blood pressures are being checked but range is not specific other than not as bad as what we got here.  Symptoms: No chest pain No chest pressure  No palpitations No syncope  Yes dyspnea No orthopnea  No paroxysmal nocturnal dyspnea Yes lower extremity edema   Pertinent labs: Lab Results  Component Value Date   CHOL 198 03/31/2021   HDL 70 03/31/2021   LDLCALC 105 (H) 03/31/2021   TRIG 135 03/31/2021   CHOLHDL 3.1 06/22/2018   Lab Results  Component Value Date   NA 139 03/31/2021   K 4.6 03/31/2021   CREATININE 1.37 (H) 03/31/2021   EGFR 39 (L) 03/31/2021   GLUCOSE 114 (H) 03/31/2021   TSH 3.720 03/31/2021     The ASCVD Risk score (Arnett DK, et al., 2019) failed to calculate for the following reasons:   The 2019 ASCVD risk score is only valid for ages 61 to 40   ---------------------------------------------------------------------------------------------------   Medications: Outpatient Medications Prior to Visit  Medication Sig   azelastine (ASTELIN) 0.1 % nasal spray Place 2 sprays into both nostrils daily.   Cholecalciferol (VITAMIN D3) 5000 units CAPS Take 1 capsule by mouth daily.   clotrimazole-betamethasone  (LOTRISONE) cream APPLY EXTERNALLY TO THE AFFECTED AREA TWICE DAILY AS NEEDED   esomeprazole (NEXIUM) 20 MG capsule Take 40 mg by mouth daily at 12 noon.    ezetimibe (ZETIA) 10 MG tablet Take 1 tablet (10 mg total) by mouth daily.   hydrALAZINE (APRESOLINE) 50 MG tablet Take 1 tablet (50 mg total) by mouth 3 (three) times daily.   ipratropium (ATROVENT) 0.03 % nasal spray USE 2 SPRAYS IN EACH NOSTRIL EVERY 8 HOURS AS NEEDED   levocetirizine (XYZAL) 5 MG tablet Take 5 mg by mouth every evening.   montelukast (SINGULAIR) 10 MG tablet TAKE 1 TABLET(10 MG) BY MOUTH DAILY   Multiple Vitamins-Minerals (EYE VITAMINS PO) Take 2 tablets by mouth daily.    potassium chloride SA (KLOR-CON) 20 MEQ tablet Take 1 tablet (20 mEq total) by mouth daily.   torsemide (DEMADEX) 20 MG tablet Take 2 tablets (40 mg total) by mouth daily.   triamcinolone (NASACORT) 55 MCG/ACT AERO nasal inhaler Place 2 sprays into the nose daily.   triamcinolone ointment (KENALOG) 0.5 % Apply 1 application topically 2 (two) times daily.   [DISCONTINUED] Omega-3 Fatty Acids (FISH OIL) 1000 MG CAPS Take 1 capsule by mouth daily.   [DISCONTINUED] pramipexole (MIRAPEX) 0.5 MG tablet Take 1 tablet (0.5 mg total) by mouth at bedtime.   [DISCONTINUED] gabapentin (NEURONTIN) 100 MG capsule Take 1 capsule (100 mg total) by mouth 2 (two) times daily.   [DISCONTINUED]  pregabalin (LYRICA) 25 MG capsule Take 1 capsule (25 mg total) by mouth 2 (two) times daily as needed.   No facility-administered medications prior to visit.    Review of Systems per HPI      Objective    BP 132/64 Comment: home readings   Pulse 75    Temp 98.4 F (36.9 C) (Oral)    Wt 235 lb (106.6 kg)    SpO2 99%    BMI 45.90 kg/m     Physical Exam Vitals reviewed.  Constitutional:      General: She is not in acute distress.    Appearance: Normal appearance. She is well-developed. She is not diaphoretic.  HENT:     Head: Normocephalic and atraumatic.  Eyes:      General: No scleral icterus.    Conjunctiva/sclera: Conjunctivae normal.  Neck:     Thyroid: No thyromegaly.  Cardiovascular:     Rate and Rhythm: Normal rate and regular rhythm.     Pulses: Normal pulses.     Heart sounds: Normal heart sounds. No murmur heard. Pulmonary:     Effort: Pulmonary effort is normal. No respiratory distress.     Breath sounds: Wheezing present. No rhonchi or rales.  Musculoskeletal:     Cervical back: Neck supple.     Right lower leg: Edema present.     Left lower leg: Edema present.  Lymphadenopathy:     Cervical: No cervical adenopathy.  Skin:    General: Skin is warm and dry.     Findings: No rash.  Neurological:     Mental Status: She is alert and oriented to person, place, and time. Mental status is at baseline.  Psychiatric:        Mood and Affect: Mood normal.        Behavior: Behavior normal.      No results found for any visits on 12/21/21.  Assessment & Plan     Problem List Items Addressed This Visit       Cardiovascular and Mediastinum   Essential hypertension - Primary    Well controlled on home readings Continue current medications Recheck metabolic panel F/u in 6 months       Relevant Orders   Comprehensive metabolic panel   AMB Referral to [facility]   Senile purpura ([place])    Chronic and stable  Continue to monitor        Respiratory   Bronchitis, mucopurulent recurrent (HCC)    Reports worsening SOB and wheezing with congestion, especially in the AM Trial of Spiriva May need pharmacy assistance      Relevant Orders   AMB Referral to [place]     Genitourinary   Chronic renal impairment, stage 3 (moderate) (HCC)    Chronic and stable Recheck metabolic panel Avoid nephrotoxic meds       Relevant Orders   Comprehensive metabolic panel     Other   RLS (restless legs syndrome)    Chronic and uncontrolled Increase pramipexole to 0.75 qhs Recheck B12 as above to ensure  this is not also contributing      Mixed hyperlipidemia    Previously well controlled Continue statin Repeat FLP and CMP      Relevant Orders   Comprehensive metabolic panel   Lipid panel   AMB Referral to [place]   Morbid obesity ([place])    Discussed importance of healthy weight management Discussed diet and exercise       PA (  pernicious anemia)    Recheck B12 and CBC Not currently on B12 supplement      Relevant Orders   B12   CBC   Prediabetes    Recommend low carb diet Recheck A1c      Relevant Orders   Hemoglobin A1c   AMB Referral to [facility] [place]     Return in about 6 months (around 06/20/2022) for chronic disease f/u.      Argentina Ponder De[place],acting as a scribe for Lavon Paganini, MD.,have documented all relevant documentation on the behalf of Lavon Paganini, MD,as directed by  Lavon Paganini, MD while in the presence of Lavon Paganini, MD.  I, Lavon Paganini, MD, have reviewed all documentation for this visit. The documentation on 12/21/21 for the exam, diagnosis, procedures, and orders are all accurate and complete.   Orlondo [place]cross, Dionne Bucy, MD, MPH [facility]

## 2021-12-21 ENCOUNTER — Encounter: Payer: Self-pay | Admitting: Family Medicine

## 2021-12-21 ENCOUNTER — Other Ambulatory Visit: Payer: Self-pay

## 2021-12-21 ENCOUNTER — Ambulatory Visit (INDEPENDENT_AMBULATORY_CARE_PROVIDER_SITE_OTHER): Payer: Medicare Other | Admitting: Family Medicine

## 2021-12-21 VITALS — BP 132/64 | HR 75 | Temp 98.4°F | Wt 235.0 lb

## 2021-12-21 DIAGNOSIS — G2581 Restless legs syndrome: Secondary | ICD-10-CM

## 2021-12-21 DIAGNOSIS — N1831 Chronic kidney disease, stage 3a: Secondary | ICD-10-CM | POA: Diagnosis not present

## 2021-12-21 DIAGNOSIS — J411 Mucopurulent chronic bronchitis: Secondary | ICD-10-CM

## 2021-12-21 DIAGNOSIS — D692 Other nonthrombocytopenic purpura: Secondary | ICD-10-CM | POA: Diagnosis not present

## 2021-12-21 DIAGNOSIS — R7303 Prediabetes: Secondary | ICD-10-CM | POA: Diagnosis not present

## 2021-12-21 DIAGNOSIS — D510 Vitamin B12 deficiency anemia due to intrinsic factor deficiency: Secondary | ICD-10-CM

## 2021-12-21 DIAGNOSIS — I10 Essential (primary) hypertension: Secondary | ICD-10-CM | POA: Diagnosis not present

## 2021-12-21 DIAGNOSIS — E782 Mixed hyperlipidemia: Secondary | ICD-10-CM

## 2021-12-21 MED ORDER — PRAMIPEXOLE DIHYDROCHLORIDE 0.75 MG PO TABS
0.7500 mg | ORAL_TABLET | Freq: Every day | ORAL | 1 refills | Status: DC
Start: 2021-12-21 — End: 2022-03-22

## 2021-12-21 MED ORDER — SPIRIVA RESPIMAT 2.5 MCG/ACT IN AERS: 2.0000 | INHALATION_SPRAY | Freq: Every day | RESPIRATORY_TRACT | 11 refills | Status: DC

## 2021-12-21 NOTE — Assessment & Plan Note (Signed)
Well controlled on home readings Continue current medications Recheck metabolic panel F/u in 6 months  

## 2021-12-21 NOTE — Assessment & Plan Note (Signed)
Chronic and stable Recheck metabolic panel Avoid nephrotoxic meds  

## 2021-12-21 NOTE — Assessment & Plan Note (Signed)
Discussed importance of healthy weight management Discussed diet and exercise  

## 2021-12-21 NOTE — Assessment & Plan Note (Signed)
Reports worsening SOB and wheezing with congestion, especially in the AM Trial of Spiriva May need pharmacy assistance

## 2021-12-21 NOTE — Assessment & Plan Note (Signed)
Chronic and uncontrolled Increase pramipexole to 0.75 qhs Recheck B12 as above to ensure this is not also contributing

## 2021-12-21 NOTE — Assessment & Plan Note (Signed)
Previously well controlled Continue statin Repeat FLP and CMP  

## 2021-12-21 NOTE — Assessment & Plan Note (Addendum)
Recheck B12 and CBC Not currently on B12 supplement

## 2021-12-21 NOTE — Assessment & Plan Note (Signed)
Chronic and stable Continue to monitor 

## 2021-12-21 NOTE — Assessment & Plan Note (Signed)
Recommend low carb diet °Recheck A1c  °

## 2021-12-22 ENCOUNTER — Telehealth: Payer: Self-pay | Admitting: *Deleted

## 2021-12-22 NOTE — Chronic Care Management (AMB) (Signed)
Chronic Care Management   Note  12/22/2021 Name: Gloria Mercer MRN: [id number] DOB: 01/16/1941  [name] is a 81 y.o. year old female who is a primary care patient of Brita Romp, Dionne Bucy, MD. I reached out to [name] by phone today in response to a referral sent by Ms. [name] Gildner's PCP.  Ms. Flemings was given information about Chronic Care Management services today including:  CCM service includes personalized support from designated clinical staff supervised by her physician, including individualized plan of care and coordination with other care providers 24/7 contact phone numbers for assistance for urgent and routine care needs. Service will only be billed when office clinical staff spend 20 minutes or more in a month to coordinate care. Only one practitioner may furnish and bill the service in a calendar month. The patient may stop CCM services at any time (effective at the end of the month) by phone call to the office staff. The patient is responsible for co-pay (up to 20% after annual deductible is met) if co-pay is required by the individual health plan.   Pt says she checked on price of Spiriva and is affordable - appreciative of call but services not needed at this time. Patient did not agree to enrollment in care management services and does not wish to consider at this time.  Follow up plan: Patient declines further follow up and engagement by the care management team. Appropriate care team members and provider have been notified via electronic communication.   Julian Hy, [facility]  Direct Dial: [phone]

## 2021-12-25 DIAGNOSIS — D510 Vitamin B12 deficiency anemia due to intrinsic factor deficiency: Secondary | ICD-10-CM | POA: Diagnosis not present

## 2021-12-25 DIAGNOSIS — I10 Essential (primary) hypertension: Secondary | ICD-10-CM | POA: Diagnosis not present

## 2021-12-25 DIAGNOSIS — N1831 Chronic kidney disease, stage 3a: Secondary | ICD-10-CM | POA: Diagnosis not present

## 2021-12-25 DIAGNOSIS — R7303 Prediabetes: Secondary | ICD-10-CM | POA: Diagnosis not present

## 2021-12-25 DIAGNOSIS — E782 Mixed hyperlipidemia: Secondary | ICD-10-CM | POA: Diagnosis not present

## 2021-12-26 LAB — COMPREHENSIVE METABOLIC PANEL
ALT: 11 IU/L (ref 0–32)
AST: 13 IU/L (ref 0–40)
Albumin/Globulin Ratio: 1.5 (ref 1.2–2.2)
Albumin: 3.7 g/dL (ref 3.6–4.6)
Alkaline Phosphatase: 100 IU/L (ref 44–121)
BUN/Creatinine Ratio: 14 (ref 12–28)
BUN: 17 mg/dL (ref 8–27)
Bilirubin Total: 0.5 mg/dL (ref 0.0–1.2)
CO2: 27 mmol/L (ref 20–29)
Calcium: 9.3 mg/dL (ref 8.7–10.3)
Chloride: 100 mmol/L (ref 96–106)
Creatinine, Ser: 1.24 mg/dL — ABNORMAL HIGH (ref 0.57–1.00)
Globulin, Total: 2.4 g/dL (ref 1.5–4.5)
Glucose: 142 mg/dL — ABNORMAL HIGH (ref 70–99)
Potassium: 3.5 mmol/L (ref 3.5–5.2)
Sodium: 141 mmol/L (ref 134–144)
Total Protein: 6.1 g/dL (ref 6.0–8.5)
eGFR: 44 mL/min/{1.73_m2} — ABNORMAL LOW (ref >59)

## 2021-12-26 LAB — LIPID PANEL
Chol/HDL Ratio: 3.2 ratio (ref 0.0–4.4)
Cholesterol, Total: 198 mg/dL (ref 100–199)
HDL: 62 mg/dL (ref >39)
LDL Chol Calc (NIH): 108 mg/dL — ABNORMAL HIGH (ref 0–99)
Triglycerides: 159 mg/dL — ABNORMAL HIGH (ref 0–149)
VLDL Cholesterol Cal: 28 mg/dL (ref 5–40)

## 2021-12-26 LAB — HEMOGLOBIN A1C
Est. average glucose Bld gHb Est-mCnc: 123 mg/dL
Hgb A1c MFr Bld: 5.9 % — ABNORMAL HIGH (ref 4.8–5.6)

## 2021-12-26 LAB — CBC
Hematocrit: 37.1 % (ref 34.0–46.6)
Hemoglobin: 12.6 g/dL (ref 11.1–15.9)
MCH: 30.1 pg (ref 26.6–33.0)
MCHC: 34 g/dL (ref 31.5–35.7)
MCV: 89 fL (ref 79–97)
Platelets: 244 10*3/uL (ref 150–450)
RBC: 4.19 x10E6/uL (ref 3.77–5.28)
RDW: 12.8 % (ref 11.7–15.4)
WBC: 7.7 10*3/uL (ref 3.4–10.8)

## 2021-12-26 LAB — VITAMIN B12: Vitamin B-12: 175 pg/mL — ABNORMAL LOW (ref 232–1245)

## 2022-01-14 DIAGNOSIS — R609 Edema, unspecified: Secondary | ICD-10-CM | POA: Diagnosis not present

## 2022-01-14 DIAGNOSIS — E559 Vitamin D deficiency, unspecified: Secondary | ICD-10-CM | POA: Diagnosis not present

## 2022-01-14 DIAGNOSIS — N1832 Chronic kidney disease, stage 3b: Secondary | ICD-10-CM | POA: Diagnosis not present

## 2022-01-14 DIAGNOSIS — D510 Vitamin B12 deficiency anemia due to intrinsic factor deficiency: Secondary | ICD-10-CM | POA: Diagnosis not present

## 2022-01-14 DIAGNOSIS — E668 Other obesity: Secondary | ICD-10-CM | POA: Diagnosis not present

## 2022-01-14 DIAGNOSIS — I509 Heart failure, unspecified: Secondary | ICD-10-CM | POA: Diagnosis not present

## 2022-01-14 DIAGNOSIS — E785 Hyperlipidemia, unspecified: Secondary | ICD-10-CM | POA: Diagnosis not present

## 2022-01-14 DIAGNOSIS — R809 Proteinuria, unspecified: Secondary | ICD-10-CM | POA: Diagnosis not present

## 2022-01-14 DIAGNOSIS — I10 Essential (primary) hypertension: Secondary | ICD-10-CM | POA: Diagnosis not present

## 2022-01-25 ENCOUNTER — Ambulatory Visit (INDEPENDENT_AMBULATORY_CARE_PROVIDER_SITE_OTHER): Payer: Medicare Other | Admitting: Family Medicine

## 2022-01-25 ENCOUNTER — Ambulatory Visit
Admission: RE | Admit: 2022-01-25 | Discharge: 2022-01-25 | Disposition: A | Discharge status: Home / Self Care | Payer: Medicare Other | Attending: Family Medicine | Admitting: Family Medicine

## 2022-01-25 ENCOUNTER — Encounter: Payer: Self-pay | Admitting: Family Medicine

## 2022-01-25 ENCOUNTER — Other Ambulatory Visit: Payer: Self-pay

## 2022-01-25 ENCOUNTER — Ambulatory Visit
Admission: RE | Admit: 2022-01-25 | Discharge: 2022-01-25 | Disposition: A | Discharge status: Home / Self Care | Payer: Medicare Other | Source: Ambulatory Visit | Attending: Family Medicine | Admitting: Family Medicine

## 2022-01-25 ENCOUNTER — Telehealth: Payer: Self-pay | Admitting: Family Medicine

## 2022-01-25 VITALS — BP 140/75 | HR 85 | Temp 97.6°F | Resp 16 | Wt 234.0 lb

## 2022-01-25 DIAGNOSIS — M654 Radial styloid tenosynovitis [de Quervain]: Secondary | ICD-10-CM | POA: Diagnosis not present

## 2022-01-25 DIAGNOSIS — G8929 Other chronic pain: Secondary | ICD-10-CM

## 2022-01-25 DIAGNOSIS — M25552 Pain in left hip: Secondary | ICD-10-CM | POA: Diagnosis not present

## 2022-01-25 DIAGNOSIS — M25551 Pain in right hip: Secondary | ICD-10-CM | POA: Diagnosis not present

## 2022-01-25 DIAGNOSIS — M25511 Pain in right shoulder: Secondary | ICD-10-CM

## 2022-01-25 DIAGNOSIS — M545 Low back pain, unspecified: Secondary | ICD-10-CM | POA: Insufficient documentation

## 2022-01-25 DIAGNOSIS — M47816 Spondylosis without myelopathy or radiculopathy, lumbar region: Secondary | ICD-10-CM | POA: Diagnosis not present

## 2022-01-25 DIAGNOSIS — M2550 Pain in unspecified joint: Secondary | ICD-10-CM

## 2022-01-25 NOTE — Progress Notes (Signed)
I,Sulibeya S Dimas,acting as a Education administrator for Lavon Paganini, MD.,have documented all relevant documentation on the behalf of Lavon Paganini, MD,as directed by  Lavon Paganini, MD while in the presence of Lavon Paganini, MD.   Established patient visit   Patient: Gloria Mercer   DOB: 04/26/1941   81 y.o. Female  MRN: [id number] Visit Date: 01/25/2022  Today's healthcare provider: Lavon Paganini, MD   Chief Complaint  Patient presents with   Hip Pain   Back Pain   Hand Pain   Shoulder Pain   Subjective    Hip Pain  There was no injury mechanism. The pain is present in the right hip and left hip. The quality of the pain is described as aching. Pain severity now: mild to severe. The pain has been Fluctuating since onset. Associated symptoms include an inability to bear weight, a loss of motion and muscle weakness. Pertinent negatives include no loss of sensation, numbness or tingling. She reports no foreign bodies present. The symptoms are aggravated by weight bearing. She has tried heat for the symptoms. The treatment provided mild relief.  Back Pain This is a new problem. The current episode started 1 to 4 weeks ago. The problem occurs constantly. The problem has been waxing and waning since onset. The pain is present in the lumbar spine and thoracic spine. The pain is moderate. The pain is Worse during the night. The symptoms are aggravated by lying down. Pertinent negatives include no chest pain, fever, numbness or tingling. She has tried heat for the symptoms. The treatment provided mild relief.  Hand Pain  There was no injury mechanism. The pain is present in the left hand and right hand. The quality of the pain is described as aching. The pain is mild. The pain has been Fluctuating since the incident. Associated symptoms include muscle weakness. Pertinent negatives include no chest pain, numbness or tingling. The symptoms are aggravated by movement and lifting. She  has tried nothing for the symptoms.  Shoulder Pain  The pain is present in the right shoulder. This is a recurrent problem. The problem occurs intermittently. The pain is mild. Associated symptoms include an inability to bear weight and stiffness. Pertinent negatives include no fever, joint swelling, limited range of motion, numbness or tingling. The symptoms are aggravated by heat. The treatment provided mild relief. Her past medical history is significant for osteoarthritis.    Hip pain ongoing since 11/22. Back pain is new. Aching pain. Doesn't hurt when sitting, only when moving. Waking her at night - hurts to lay on her side. Has to get up and move after 2-3 hours of sleep.  Heating pad helps some.  Sometimes radiation down L leg. Mostly over SI joints and lumbar spine.  Not taking any OTC pain meds.  Trouble with grip strength, lifting pot with any weight.  Occasionally seize up.  No numbness. +weakness. Pain in base of thumb.  R shoulder, intermittent. Feels near shoulder blade. Nothing makes it better or worse.    Medications: Outpatient Medications Prior to Visit  Medication Sig   azelastine (ASTELIN) 0.1 % nasal spray Place 2 sprays into both nostrils daily.   Cholecalciferol (VITAMIN D3) 5000 units CAPS Take 1 capsule by mouth daily.   clotrimazole-betamethasone (LOTRISONE) cream APPLY EXTERNALLY TO THE AFFECTED AREA TWICE DAILY AS NEEDED   esomeprazole (NEXIUM) 20 MG capsule Take 40 mg by mouth daily at 12 noon.    ezetimibe (ZETIA) 10 MG tablet Take 1 tablet (  10 mg total) by mouth daily.   ipratropium (ATROVENT) 0.03 % nasal spray USE 2 SPRAYS IN EACH NOSTRIL EVERY 8 HOURS AS NEEDED   levocetirizine (XYZAL) 5 MG tablet Take 5 mg by mouth every evening.   montelukast (SINGULAIR) 10 MG tablet TAKE 1 TABLET(10 MG) BY MOUTH DAILY   Multiple Vitamins-Minerals (EYE VITAMINS PO) Take 2 tablets by mouth daily.    potassium chloride SA (KLOR-CON) 20 MEQ tablet Take 1 tablet (20 mEq total)  by mouth daily.   pramipexole (MIRAPEX) 0.75 MG tablet Take 1 tablet (0.75 mg total) by mouth at bedtime.   Tiotropium Bromide Monohydrate (SPIRIVA RESPIMAT) 2.5 MCG/ACT AERS Inhale 2 puffs into the lungs daily.   torsemide (DEMADEX) 20 MG tablet Take 2 tablets (40 mg total) by mouth daily.   triamcinolone (NASACORT) 55 MCG/ACT AERO nasal inhaler Place 2 sprays into the nose daily.   triamcinolone ointment (KENALOG) 0.5 % Apply 1 application topically 2 (two) times daily.   hydrALAZINE (APRESOLINE) 50 MG tablet Take 1 tablet (50 mg total) by mouth 3 (three) times daily.   No facility-administered medications prior to visit.    Review of Systems  Constitutional:  Negative for appetite change and fever.  Respiratory:  Positive for cough, shortness of breath and wheezing.   Cardiovascular:  Negative for chest pain, palpitations and leg swelling.  Musculoskeletal:  Positive for arthralgias, back pain, myalgias and stiffness.  Neurological:  Negative for tingling and numbness.      Objective    BP 140/75 (BP Location: Left Arm, Patient Position: Sitting, Cuff Size: Large)    Pulse 85    Temp 97.6 F (36.4 C) (Temporal)    Resp 16    Wt 234 lb (106.1 kg)    SpO2 96%    BMI 45.70 kg/m    Physical Exam Vitals reviewed.  Constitutional:      General: She is not in acute distress.    Appearance: Normal appearance. She is well-developed. She is not diaphoretic.  HENT:     Head: Normocephalic and atraumatic.  Eyes:     General: No scleral icterus.    Conjunctiva/sclera: Conjunctivae normal.  Neck:     Thyroid: No thyromegaly.  Cardiovascular:     Rate and Rhythm: Normal rate and regular rhythm.     Pulses: Normal pulses.     Heart sounds: Normal heart sounds. No murmur heard. Pulmonary:     Effort: Pulmonary effort is normal. No respiratory distress.     Breath sounds: Normal breath sounds. No wheezing, rhonchi or rales.  Musculoskeletal:     Right shoulder: No swelling, deformity,  tenderness or bony tenderness. Normal range of motion. Normal strength.     Right wrist: No deformity or effusion. Normal range of motion.     Left wrist: No deformity or effusion. Normal range of motion.     Cervical back: Normal and neck supple.     Lumbar back: Tenderness and bony tenderness present. No swelling or deformity. Decreased range of motion.     Right hip: Bony tenderness present. Normal range of motion.     Left hip: Bony tenderness present. Normal range of motion.     Right lower leg: No edema.     Left lower leg: No edema.     Comments: +Finkelsteins bilaterally  Lymphadenopathy:     Cervical: No cervical adenopathy.  Skin:    General: Skin is warm and dry.     Findings: No rash.  Neurological:  Mental Status: She is alert and oriented to person, place, and time. Mental status is at baseline.  Psychiatric:        Mood and Affect: Mood normal.        Behavior: Behavior normal.      No results found for any visits on 01/25/22.  Assessment & Plan     1. Polyarthralgia - considering multiple joints affected, consider Rheum condition - referral placed today - Ambulatory referral to Rheumatology  2. De Quervain's tenosynovitis, bilateral - recurrent issue - may benefit from PT - referral to hand surgery for possible steroid injections that have helped in the past - Ambulatory referral to Hand Surgery  3. Chronic bilateral low back pain without sciatica 4. Bilateral hip pain - recurrent issue - will get XRays - may need PT or ORtho - avoid NSAIDs given CKD - ok to take Tylenol - DG Lumbar Spine Complete; Future - DG Hip Unilat W OR W/O Pelvis 2-3 Views Right; Future - DG Hip Unilat W OR W/O Pelvis 2-3 Views Left; Future  5. Acute pain of right shoulder - not painful today - will get baseline XRay - further treatment possible pending results - DG Shoulder Right; Future  6. Morbid obesity ([place]) Discussed importance of healthy weight  management Discussed diet and exercise  - difficulty loosing weight due to chronic pain affecting exercise - will try Healthy Weight and Wellness - Amb Ref to Medical Weight Management   No follow-ups on file.      I, Lavon Paganini, MD, have reviewed all documentation for this visit. The documentation on 01/25/22 for the exam, diagnosis, procedures, and orders are all accurate and complete.   Nishka Heide, Dionne Bucy, MD, MPH [facility]

## 2022-01-25 NOTE — Telephone Encounter (Signed)
Pt is calling to ask Dr. B how Tylenol that she was advised to take during today's office visit? Please advise CB- [phone]

## 2022-01-26 NOTE — Telephone Encounter (Signed)
Patient advised as below.  

## 2022-01-26 NOTE — Telephone Encounter (Signed)
Tylenol 1000mg  TID prn (no more than 3000mg  daily)

## 2022-02-01 ENCOUNTER — Telehealth: Payer: Self-pay

## 2022-02-01 DIAGNOSIS — M25551 Pain in right hip: Secondary | ICD-10-CM

## 2022-02-01 DIAGNOSIS — M25552 Pain in left hip: Secondary | ICD-10-CM

## 2022-02-01 NOTE — Telephone Encounter (Signed)
-----   Message from Virginia Crews, MD sent at 01/28/2022  1:26 PM EST ----- ?Moderate arthritis in both hips and lumbar spine (low back). See other result notes ?

## 2022-02-01 NOTE — Progress Notes (Signed)
Ok to place referral to [facility]

## 2022-02-02 ENCOUNTER — Other Ambulatory Visit: Payer: Self-pay | Admitting: Family Medicine

## 2022-02-02 DIAGNOSIS — E7800 Pure hypercholesterolemia, unspecified: Secondary | ICD-10-CM

## 2022-02-02 DIAGNOSIS — M159 Polyosteoarthritis, unspecified: Secondary | ICD-10-CM | POA: Diagnosis not present

## 2022-02-02 DIAGNOSIS — M19031 Primary osteoarthritis, right wrist: Secondary | ICD-10-CM | POA: Diagnosis not present

## 2022-02-02 DIAGNOSIS — M19042 Primary osteoarthritis, left hand: Secondary | ICD-10-CM | POA: Diagnosis not present

## 2022-02-02 DIAGNOSIS — G8929 Other chronic pain: Secondary | ICD-10-CM | POA: Diagnosis not present

## 2022-02-02 DIAGNOSIS — M19041 Primary osteoarthritis, right hand: Secondary | ICD-10-CM | POA: Diagnosis not present

## 2022-02-03 NOTE — Telephone Encounter (Signed)
Requested Prescriptions  ?Pending Prescriptions Disp Refills  ?? ezetimibe (ZETIA) 10 MG tablet [Pharmacy Med Name: EZETIMIBE '10MG'$  TABLETS] 90 tablet 2  ?  Sig: TAKE 1 TABLET(10 MG) BY MOUTH DAILY  ?  ? Cardiovascular:  Antilipid - Sterol Transport Inhibitors Failed - 02/02/2022  4:36 PM  ?  ?  Failed - Lipid Panel in normal range within the last 12 months  ?  Cholesterol, Total  ?Date Value Ref Range Status  ?12/25/2021 198 100 - 199 mg/dL Final  ? ?LDL Chol Calc (NIH)  ?Date Value Ref Range Status  ?12/25/2021 108 (H) 0 - 99 mg/dL Final  ? ?HDL  ?Date Value Ref Range Status  ?12/25/2021 62 >39 mg/dL Final  ? ?Triglycerides  ?Date Value Ref Range Status  ?12/25/2021 159 (H) 0 - 149 mg/dL Final  ? ?  ?  ?  Passed - AST in normal range and within 360 days  ?  AST  ?Date Value Ref Range Status  ?12/25/2021 13 0 - 40 IU/L Final  ?   ?  ?  Passed - ALT in normal range and within 360 days  ?  ALT  ?Date Value Ref Range Status  ?12/25/2021 11 0 - 32 IU/L Final  ?   ?  ?  Passed - Patient is not pregnant  ?  ?  Passed - Valid encounter within last 12 months  ?  Recent Outpatient Visits   ?      ? 1 week ago Polyarthralgia  ? [facility] [place], Dionne Bucy, MD  ? 1 month ago Essential hypertension  ? [facility], Dionne Bucy, MD  ? 4 months ago Left sided sciatica  ? [facility], Ria Comment, PA-C  ? 10 months ago Essential hypertension  ? [name], Dionne Bucy, MD  ? 1 year ago Essential hypertension  ? [facility] [name], Dionne Bucy, MD  ?  ?  ?Future Appointments   ?        ? In 2 weeks Agbor-Etang, Aaron Edelman, MD [facility]  ? In 4 months [name], Dionne Bucy, MD [facility]  ?  ? ?  ?  ?  ? ?

## 2022-02-08 DIAGNOSIS — H353212 Exudative age-related macular degeneration, right eye, with inactive choroidal neovascularization: Secondary | ICD-10-CM | POA: Diagnosis not present

## 2022-02-08 DIAGNOSIS — H353122 Nonexudative age-related macular degeneration, left eye, intermediate dry stage: Secondary | ICD-10-CM | POA: Diagnosis not present

## 2022-02-16 DIAGNOSIS — Z0289 Encounter for other administrative examinations: Secondary | ICD-10-CM

## 2022-02-19 ENCOUNTER — Ambulatory Visit: Payer: Medicare Other | Admitting: Cardiology

## 2022-02-22 ENCOUNTER — Ambulatory Visit: Payer: Self-pay

## 2022-02-22 ENCOUNTER — Telehealth: Payer: Self-pay | Admitting: Family Medicine

## 2022-02-22 ENCOUNTER — Ambulatory Visit: Payer: Self-pay | Admitting: *Deleted

## 2022-02-22 NOTE — Telephone Encounter (Signed)
?  Chief Complaint: abd pain with constipation ?Symptoms: constipation ?Frequency: 3 days ?Pertinent Negatives: Patient denies na  ?Disposition: '[x]'$ ED /'[]'$ Urgent Care (no appt availability in office) / '[]'$ Appointment(In office/virtual)/ '[]'$  [place] [facility]/ '[]'$ Home Care/ '[]'$ Refused Recommended Disposition /'[]'$ [place] [facility]/ '[]'$  Follow-up with PCP ?Additional Notes: To ED, pt in pain, respirations in the 40's, she states she will go to [facility] ED. ? ?Reason for Disposition ? Patient sounds very sick or weak to the triager ? ?Answer Assessment - Initial Assessment Questions ?1. STOOL PATTERN OR FREQUENCY: "How often do you have a bowel movement (BM)?"  (Normal range: 3 times a day to every 3 days)  "When was your last BM?"   ?    Last Friday, 3 days ?2. STRAINING: "Do you have to strain to have a BM?"  ?    Yes, nothing  ?3. RECTAL PAIN: "Does your rectum hurt when the stool comes out?" If Yes, ask: "Do you have hemorrhoids? How bad is the pain?"  (Scale 1-10; or mild, moderate, severe) ?    No, only when push ?4. STOOL COMPOSITION: "Are the stools hard?"  ?    normal ?5. BLOOD ON STOOLS: "Has there been any blood on the toilet tissue or on the surface of the BM?" If Yes, ask: "When was the last time?"  ?    no ?6. CHRONIC CONSTIPATION: "Is this a new problem for you?"  If no, ask: "How long have you had this problem?" (days, weeks, months)  ?    Not usually a problem, usually goes everyday ?7. CHANGES IN DIET OR HYDRATION: "Have there been any recent changes in your diet?" "How much fluids are you drinking on a daily basis?"  "How much have you had to drink today?" ?    Ate less since doesn't feel good ?8. MEDICATIONS: "Have you been taking any new medications?" "Are you taking any narcotic pain medications?" (e.g., Vicodin, Percocet, morphine, Dilaudid) ?    *No Answer* ?9. LAXATIVES: "Have you been using any stool softeners, laxatives, or enemas?"  If yes, ask "What, how often, and when was the last time?" ?     Senekot, took at 3pm today ?10. ACTIVITY:  "How much walking do you do every day?"  "Has your activity level decreased in the past week?"  ?      na ?11. CAUSE: "What do you think is causing the constipation?"  ?      unknown ?12. OTHER SYMPTOMS: "Do you have any other symptoms?" (e.g., abdominal pain, bloating, fever, vomiting) ?      Abdominal pain, nausea, hurts low in abd in middle ?13. MEDICAL HISTORY: "Do you have a history of hemorrhoids, rectal fissures, or rectal surgery or rectal abscess?"   ?      no ?14. PREGNANCY: "Is there any chance you are pregnant?" "When was your last menstrual period?" ?      na ? ?Protocols used: Constipation-A-AH ? ?

## 2022-02-22 NOTE — Telephone Encounter (Signed)
Pt stated she needs to speak with a nurse about constipation. Pt requests call back. Cb# [phone]  ? ? ? ?Chief Complaint: Last BM Saturday. Has taken Senakot, Miralax does not work. Reviewed home remedies. ?Symptoms: Abdominal discomfort. ?Frequency: Saturday ?Pertinent Negatives: Patient denies  ?Disposition: '[]'$ ED /'[]'$ Urgent Care (no appt availability in office) / '[]'$ Appointment(In office/virtual)/ '[]'$  [place] [facility]/ '[]'$ Home Care/ '[]'$ Refused Recommended Disposition /'[]'$ [place] [facility]/ '[]'$  Follow-up with PCP ?Additional Notes: Pt. Will call back if she does not have a bowel movement.  ?Reason for Disposition ? Over-The-Counter (OTC) medicines for constipation, questions about ? ?Answer Assessment - Initial Assessment Questions ?1. STOOL PATTERN OR FREQUENCY: "How often do you have a bowel movement (BM)?"  (Normal range: 3 times a day to every 3 days)  "When was your last BM?"   ?    2 x day ?2. STRAINING: "Do you have to strain to have a BM?"  ?    Yes ?3. RECTAL PAIN: "Does your rectum hurt when the stool comes out?" If Yes, ask: "Do you have hemorrhoids? How bad is the pain?"  (Scale 1-10; or mild, moderate, severe) ?    Mild ?4. STOOL COMPOSITION: "Are the stools hard?"  ?    Hard ?5. BLOOD ON STOOLS: "Has there been any blood on the toilet tissue or on the surface of the BM?" If Yes, ask: "When was the last time?"  ?    No ?6. CHRONIC CONSTIPATION: "Is this a new problem for you?"  If no, ask: "How long have you had this problem?" (days, weeks, months)  ?    No ?7. CHANGES IN DIET OR HYDRATION: "Have there been any recent changes in your diet?" "How much fluids are you drinking on a daily basis?"  "How much have you had to drink today?" ?    Drinks plenty of water ?8. MEDICATIONS: "Have you been taking any new medications?" "Are you taking any narcotic pain medications?" (e.g., Vicodin, Percocet, morphine, Dilaudid) ?    No ?9. LAXATIVES: "Have you been using any stool softeners, laxatives, or  enemas?"  If yes, ask "What, how often, and when was the last time?" ?    Senakot ?10. ACTIVITY:  "How much walking do you do every day?"  "Has your activity level decreased in the past week?"  ?      No ?11. CAUSE: "What do you think is causing the constipation?"  ?      Unsure ?12. OTHER SYMPTOMS: "Do you have any other symptoms?" (e.g., abdominal pain, bloating, fever, vomiting) ?      Abdominal pain ?13. MEDICAL HISTORY: "Do you have a history of hemorrhoids, rectal fissures, or rectal surgery or rectal abscess?"   ?      Yes ?14. PREGNANCY: "Is there any chance you are pregnant?" "When was your last menstrual period?" ?      No ? ?Protocols used: Constipation-A-AH ? ?

## 2022-02-22 NOTE — Telephone Encounter (Signed)
Copied from [place] [id number]. Topic: Medicare AWV ?>> Feb 22, 2022 11:20 AM Cher Nakai R wrote: ?Reason for CRM:  ?Left message for patient to call back and schedule Medicare Annual Wellness Visit (AWV) in office.  ? ?If not able to come in office, please offer to do virtually or by telephone.  ? ?Last AWV: 09/16/2020 ? ?Please schedule at anytime with [facility] Advisor. ? ?If any questions, please contact me at [phone] ?

## 2022-02-23 ENCOUNTER — Ambulatory Visit (INDEPENDENT_AMBULATORY_CARE_PROVIDER_SITE_OTHER): Payer: Medicare Other | Admitting: Family Medicine

## 2022-02-23 NOTE — Telephone Encounter (Signed)
Noted  

## 2022-02-24 ENCOUNTER — Ambulatory Visit (INDEPENDENT_AMBULATORY_CARE_PROVIDER_SITE_OTHER): Payer: Medicare Other

## 2022-02-24 ENCOUNTER — Ambulatory Visit (INDEPENDENT_AMBULATORY_CARE_PROVIDER_SITE_OTHER): Payer: BLUE CROSS/BLUE SHIELD | Admitting: Family Medicine

## 2022-02-24 VITALS — Wt 234.0 lb

## 2022-02-24 DIAGNOSIS — Z0000 Encounter for general adult medical examination without abnormal findings: Secondary | ICD-10-CM

## 2022-02-24 NOTE — Progress Notes (Signed)
?Virtual Visit via Telephone Note ? ?I connected with  [name] on 02/24/22 at  2:30 PM EDT by telephone and verified that I am speaking with the correct person using two identifiers. ? ?Location: ?Patient: home ?Provider: [facility] ?Persons participating in the virtual visit: patient/Nurse Health Advisor ?  ?I discussed the limitations, risks, security and privacy concerns of performing an evaluation and management service by telephone and the availability of in person appointments. The patient expressed understanding and agreed to proceed. ? ?Interactive audio and video telecommunications were attempted between this nurse and patient, however failed, due to patient having technical difficulties OR patient did not have access to video capability.  We continued and completed visit with audio only. ? ?Some vital signs may be absent or patient reported.  ? ?Dionisio David, LPN ? ?Subjective:  ? Gloria Mercer is a 81 y.o. female who presents for Medicare Annual (Subsequent) preventive examination. ? ?Review of Systems    ? ?  ? ?   ?Objective:  ?  ?There were no vitals filed for this visit. ?There is no height or weight on file to calculate BMI. ? ? ?  09/16/2020  ?  1:35 PM 06/24/2020  ? 12:51 PM 09/05/2019  ? 11:20 AM 07/09/2018  ?  5:02 PM 07/08/2018  ?  4:35 PM 08/29/2017  ?  2:20 PM  ?Advanced Directives  ?Does Patient Have a Medical Advance Directive? Yes No Yes Yes No No  ?Type of Paramedic of [place];Living will  [place];Living will Healthcare Power of Attorney    ?Does patient want to make changes to medical advance directive?    No - Patient declined    ?Copy of [place] in Chart? Yes - validated most recent copy scanned in chart (See row information)  Yes - validated most recent copy scanned in chart (See row information) Yes    ?Would patient like information on creating a medical advance directive?    No - Patient declined No - Patient  declined   ? ? ?Current Medications (verified) ?Outpatient Encounter Medications as of 02/24/2022  ?Medication Sig  ? DULoxetine (CYMBALTA) 30 MG capsule Take by mouth.  ? azelastine (ASTELIN) 0.1 % nasal spray Place 2 sprays into both nostrils daily.  ? Cholecalciferol (VITAMIN D3) 5000 units CAPS Take 1 capsule by mouth daily.  ? clotrimazole-betamethasone (LOTRISONE) cream APPLY EXTERNALLY TO THE AFFECTED AREA TWICE DAILY AS NEEDED  ? DULoxetine (CYMBALTA) 30 MG capsule Take by mouth.  ? esomeprazole (NEXIUM) 20 MG capsule Take 40 mg by mouth daily at 12 noon.   ? ezetimibe (ZETIA) 10 MG tablet TAKE 1 TABLET(10 MG) BY MOUTH DAILY  ? hydrALAZINE (APRESOLINE) 50 MG tablet Take 1 tablet (50 mg total) by mouth 3 (three) times daily.  ? ipratropium (ATROVENT) 0.03 % nasal spray USE 2 SPRAYS IN EACH NOSTRIL EVERY 8 HOURS AS NEEDED  ? levocetirizine (XYZAL) 5 MG tablet Take 5 mg by mouth every evening.  ? montelukast (SINGULAIR) 10 MG tablet TAKE 1 TABLET(10 MG) BY MOUTH DAILY  ? Multiple Vitamins-Minerals (EYE VITAMINS PO) Take 2 tablets by mouth daily.   ? potassium chloride SA (KLOR-CON) 20 MEQ tablet Take 1 tablet (20 mEq total) by mouth daily.  ? pramipexole (MIRAPEX) 0.75 MG tablet Take 1 tablet (0.75 mg total) by mouth at bedtime.  ? tiotropium (SPIRIVA) 18 MCG inhalation capsule Place into inhaler and inhale.  ? Tiotropium Bromide Monohydrate (SPIRIVA RESPIMAT)  2.5 MCG/ACT AERS Inhale 2 puffs into the lungs daily.  ? torsemide (DEMADEX) 20 MG tablet Take 2 tablets (40 mg total) by mouth daily.  ? triamcinolone (NASACORT) 55 MCG/ACT AERO nasal inhaler Place 2 sprays into the nose daily.  ? triamcinolone ointment (KENALOG) 0.5 % Apply 1 application topically 2 (two) times daily.  ? ?No facility-administered encounter medications on file as of 02/24/2022.  ? ? ?Allergies (verified) ?Erythromycin, Losartan, Metoprolol, Simvastatin, Dexlansoprazole, Lisinopril, and Prednisone  ? ?History: ?Past Medical History:   ?Diagnosis Date  ? Allergic rhinitis   ? on allergy shots, sees Dr. Pryor Ochoa  ? Cataract   ? GERD (gastroesophageal reflux disease)   ? Hypertension   ? Macular degeneration   ? followed by Dr Garwin Brothers at [facility]  ? Osteoarthritis   ? Sciatica   ? ?Past Surgical History:  ?Procedure Laterality Date  ? CHOLECYSTECTOMY  2002  ? TUBAL LIGATION  1978  ? ?Family History  ?Problem Relation Age of Onset  ? Hypertension Mother   ? Seizures Mother   ? Heart disease Father 38  ?     several heart attacks  ? Diabetes Father   ?     diet controlled  ? Myelodysplastic syndrome Sister   ? Prostate cancer Paternal Grandfather   ? Breast cancer Neg Hx   ? Cervical cancer Neg Hx   ? ?Social History  ? ?Socioeconomic History  ? Marital status: Divorced  ?  Spouse name: Not on file  ? Number of children: 1  ? Years of education: Not on file  ? Highest education level: Bachelor's degree (e.g., BA, AB, BS)  ?Occupational History  ? Occupation: retired in 2001  ?  Comment: social work Scientist, physiological  ?Tobacco Use  ? Smoking status: Former  ?  Packs/day: 1.00  ?  Years: 10.00  ?  Pack years: 10.00  ?  Types: Cigarettes  ?  Quit date: 11/28/1976  ?  Years since quitting: 45.2  ? Smokeless tobacco: Never  ?Vaping Use  ? Vaping Use: Never used  ?Substance and Sexual Activity  ? Alcohol use: Not Currently  ?  Comment: rarely, at holidays  ? Drug use: No  ? Sexual activity: Not Currently  ?Other Topics Concern  ? Not on file  ?Social History Narrative  ? Not on file  ? ?Social Determinants of Health  ? ?Financial Resource Strain: Not on file  ?Food Insecurity: Not on file  ?Transportation Needs: Not on file  ?Physical Activity: Not on file  ?Stress: Not on file  ?Social Connections: Not on file  ? ? ?Tobacco Counseling ?Counseling given: Not Answered ? ? ?Clinical Intake: ? ?Pre-visit preparation completed: Yes ? ?Pain : No/denies pain ? ?  ? ?Nutritional Risks: None ?Diabetes: No ? ?How often do you need to have someone help you when you read  instructions, pamphlets, or other written materials from your doctor or pharmacy?: 1 - Never ? ?Diabetic?no ? ?Interpreter Needed?: No ? ?Information entered by :: Kirke Shaggy, LPN ? ? ?Activities of Daily Living ? ?  12/21/2021  ?  1:20 PM 03/31/2021  ?  1:47 PM  ?In your present state of health, do you have any difficulty performing the following activities:  ?Hearing? 0 0  ?Vision? 0 1  ?Difficulty concentrating or making decisions? 0 1  ?Walking or climbing stairs? 1 1  ?Dressing or bathing? 0 1  ?Doing errands, shopping? 0 1  ?Comment Limited   ? ? ?Patient Care Team: ?  [name], Dionne Bucy, MD as PCP - General (Family Medicine) ?Kate Sable, MD as PCP - Cardiology (Cardiology) ?Mettu, Leandro Reasoner, MD as Referring Physician (Ophthalmology) ? ?Indicate any recent Medical Services you may have received from other than [facility] providers in the past year (date may be approximate). ? ?   ?Assessment:  ? This is a routine wellness examination for [place]. ? ?Hearing/Vision screen ?No results found. ? ?Dietary issues and exercise activities discussed: ?  ? ? Goals Addressed   ?None ?  ? ?Depression Screen ? ?  12/21/2021  ?  1:20 PM 03/31/2021  ?  1:47 PM 01/27/2021  ? 11:33 AM 09/29/2020  ? 11:02 AM 09/16/2020  ?  1:32 PM 06/24/2020  ? 12:51 PM 09/05/2019  ? 11:21 AM  ?PHQ 2/9 Scores  ?PHQ - 2 Score 1 0 4 0 0 0 1  ?PHQ- 9 Score '6 3 13 5     '$ ?Exception Documentation      Patient refusal   ?  ?Fall Risk ? ?  12/21/2021  ?  1:19 PM 03/31/2021  ?  1:48 PM 09/29/2020  ? 11:02 AM 09/16/2020  ?  1:35 PM 06/24/2020  ? 12:50 PM  ?Fall Risk   ?Falls in the past year? 0 0 0 0 0  ?Number falls in past yr: 0 0 0 0 0  ?Injury with Fall? 0 0 0 0 0  ?Risk for fall due to :  No Fall Risks No Fall Risks  No Fall Risks  ?Follow up  Falls evaluation completed Falls evaluation completed  Falls evaluation completed  ? ? ?FALL RISK PREVENTION PERTAINING TO THE HOME: ? ?Any stairs in or around the home? Yes  ?If so, are there any without handrails? No   ?Home free of loose throw rugs in walkways, pet beds, electrical cords, etc? Yes  ?Adequate lighting in your home to reduce risk of falls? Yes  ? ?ASSISTIVE DEVICES UTILIZED TO PREVENT FALLS: ? ?Life alert? No  ?

## 2022-02-24 NOTE — Patient Instructions (Signed)
Ms. Wenz , ?Thank you for taking time to come for your Medicare Wellness Visit. I appreciate your ongoing commitment to your health goals. Please review the following plan we discussed and let me know if I can assist you in the future.  ? ?Screening recommendations/referrals: ?Colonoscopy: aged out ?Mammogram: aged out ?Bone Density: declined referral ?Recommended yearly ophthalmology/optometry visit for glaucoma screening and checkup ?Recommended yearly dental visit for hygiene and checkup ? ?Vaccinations: ?Influenza vaccine: 10/02/21 ?Pneumococcal vaccine: 05/31/15 ?Tdap vaccine: 11/08/08, due ?Shingles vaccine: Zostavax 05/24/14   Shingrix 07/05/17, 02/05/18   ?Covid-19:12/25/19, 01/15/20, 09/01/20 ? ?Advanced directives: yes ? ?Conditions/risks identified: none ? ?Next appointment: Follow up in one year for your annual wellness visit - declined ? ? ?Preventive Care 64 Years and Older, Female ?Preventive care refers to lifestyle choices and visits with your health care provider that can promote health and wellness. ?What does preventive care include? ?A yearly physical exam. This is also called an annual well check. ?Dental exams once or twice a year. ?Routine eye exams. Ask your health care provider how often you should have your eyes checked. ?Personal lifestyle choices, including: ?Daily care of your teeth and gums. ?Regular physical activity. ?Eating a healthy diet. ?Avoiding tobacco and drug use. ?Limiting alcohol use. ?Practicing safe sex. ?Taking low-dose aspirin every day. ?Taking vitamin and mineral supplements as recommended by your health care provider. ?What happens during an annual well check? ?The services and screenings done by your health care provider during your annual well check will depend on your age, overall health, lifestyle risk factors, and family history of disease. ?Counseling  ?Your health care provider may ask you questions about your: ?Alcohol use. ?Tobacco use. ?Drug use. ?Emotional  well-being. ?Home and relationship well-being. ?Sexual activity. ?Eating habits. ?History of falls. ?Memory and ability to understand (cognition). ?Work and work Statistician. ?Reproductive health. ?Screening  ?You may have the following tests or measurements: ?Height, weight, and BMI. ?Blood pressure. ?Lipid and cholesterol levels. These may be checked every 5 years, or more frequently if you are over 20 years old. ?Skin check. ?Lung cancer screening. You may have this screening every year starting at age 18 if you have a 30-pack-year history of smoking and currently smoke or have quit within the past 15 years. ?Fecal occult blood test (FOBT) of the stool. You may have this test every year starting at age 32. ?Flexible sigmoidoscopy or colonoscopy. You may have a sigmoidoscopy every 5 years or a colonoscopy every 10 years starting at age 67. ?Hepatitis C blood test. ?Hepatitis B blood test. ?Sexually transmitted disease (STD) testing. ?Diabetes screening. This is done by checking your blood sugar (glucose) after you have not eaten for a while (fasting). You may have this done every 1-3 years. ?Bone density scan. This is done to screen for osteoporosis. You may have this done starting at age 110. ?Mammogram. This may be done every 1-2 years. Talk to your health care provider about how often you should have regular mammograms. ?Talk with your health care provider about your test results, treatment options, and if necessary, the need for more tests. ?Vaccines  ?Your health care provider may recommend certain vaccines, such as: ?Influenza vaccine. This is recommended every year. ?Tetanus, diphtheria, and acellular pertussis (Tdap, Td) vaccine. You may need a Td booster every 10 years. ?Zoster vaccine. You may need this after age 30. ?Pneumococcal 13-valent conjugate (PCV13) vaccine. One dose is recommended after age 65. ?Pneumococcal polysaccharide (PPSV23) vaccine. One dose is recommended after age  26. ?Talk to your  health care provider about which screenings and vaccines you need and how often you need them. ?This information is not intended to replace advice given to you by your health care provider. Make sure you discuss any questions you have with your health care provider. ?Document Released: 12/12/2015 Document Revised: 08/04/2016 Document Reviewed: 09/16/2015 ?Elsevier Interactive Patient Education ? 2017 [place]. ? ?Fall Prevention in the Home ?Falls can cause injuries. They can happen to people of all ages. There are many things you can do to make your home safe and to help prevent falls. ?What can I do on the outside of my home? ?Regularly fix the edges of walkways and driveways and fix any cracks. ?Remove anything that might make you trip as you walk through a door, such as a raised step or threshold. ?Trim any bushes or trees on the path to your home. ?Use bright outdoor lighting. ?Clear any walking paths of anything that might make someone trip, such as rocks or tools. ?Regularly check to see if handrails are loose or broken. Make sure that both sides of any steps have handrails. ?Any raised decks and porches should have guardrails on the edges. ?Have any leaves, snow, or ice cleared regularly. ?Use sand or salt on walking paths during winter. ?Clean up any spills in your garage right away. This includes oil or grease spills. ?What can I do in the bathroom? ?Use night lights. ?Install grab bars by the toilet and in the tub and shower. Do not use towel bars as grab bars. ?Use non-skid mats or decals in the tub or shower. ?If you need to sit down in the shower, use a plastic, non-slip stool. ?Keep the floor dry. Clean up any water that spills on the floor as soon as it happens. ?Remove soap buildup in the tub or shower regularly. ?Attach bath mats securely with double-sided non-slip rug tape. ?Do not have throw rugs and other things on the floor that can make you trip. ?What can I do in the bedroom? ?Use night  lights. ?Make sure that you have a light by your bed that is easy to reach. ?Do not use any sheets or blankets that are too big for your bed. They should not hang down onto the floor. ?Have a firm chair that has side arms. You can use this for support while you get dressed. ?Do not have throw rugs and other things on the floor that can make you trip. ?What can I do in the kitchen? ?Clean up any spills right away. ?Avoid walking on wet floors. ?Keep items that you use a lot in easy-to-reach places. ?If you need to reach something above you, use a strong step stool that has a grab bar. ?Keep electrical cords out of the way. ?Do not use floor polish or wax that makes floors slippery. If you must use wax, use non-skid floor wax. ?Do not have throw rugs and other things on the floor that can make you trip. ?What can I do with my stairs? ?Do not leave any items on the stairs. ?Make sure that there are handrails on both sides of the stairs and use them. Fix handrails that are broken or loose. Make sure that handrails are as long as the stairways. ?Check any carpeting to make sure that it is firmly attached to the stairs. Fix any carpet that is loose or worn. ?Avoid having throw rugs at the top or bottom of the stairs. If you do have  throw rugs, attach them to the floor with carpet tape. ?Make sure that you have a light switch at the top of the stairs and the bottom of the stairs. If you do not have them, ask someone to add them for you. ?What else can I do to help prevent falls? ?Wear shoes that: ?Do not have high heels. ?Have rubber bottoms. ?Are comfortable and fit you well. ?Are closed at the toe. Do not wear sandals. ?If you use a stepladder: ?Make sure that it is fully opened. Do not climb a closed stepladder. ?Make sure that both sides of the stepladder are locked into place. ?Ask someone to hold it for you, if possible. ?Clearly mark and make sure that you can see: ?Any grab bars or handrails. ?First and last  steps. ?Where the edge of each step is. ?Use tools that help you move around (mobility aids) if they are needed. These include: ?Canes. ?Walkers. ?Scooters. ?Crutches. ?Turn on the lights when you go into a dark are

## 2022-03-02 ENCOUNTER — Ambulatory Visit (INDEPENDENT_AMBULATORY_CARE_PROVIDER_SITE_OTHER): Payer: Medicare Other | Admitting: Family Medicine

## 2022-03-02 ENCOUNTER — Encounter (INDEPENDENT_AMBULATORY_CARE_PROVIDER_SITE_OTHER): Payer: Self-pay | Admitting: Family Medicine

## 2022-03-02 VITALS — BP 145/75 | HR 62 | Temp 97.8°F | Ht 61.0 in | Wt 236.0 lb

## 2022-03-02 DIAGNOSIS — E559 Vitamin D deficiency, unspecified: Secondary | ICD-10-CM

## 2022-03-02 DIAGNOSIS — E782 Mixed hyperlipidemia: Secondary | ICD-10-CM

## 2022-03-02 DIAGNOSIS — R739 Hyperglycemia, unspecified: Secondary | ICD-10-CM

## 2022-03-02 DIAGNOSIS — E538 Deficiency of other specified B group vitamins: Secondary | ICD-10-CM | POA: Diagnosis not present

## 2022-03-02 DIAGNOSIS — R0602 Shortness of breath: Secondary | ICD-10-CM | POA: Diagnosis not present

## 2022-03-02 DIAGNOSIS — Z1331 Encounter for screening for depression: Secondary | ICD-10-CM | POA: Diagnosis not present

## 2022-03-02 DIAGNOSIS — N1832 Chronic kidney disease, stage 3b: Secondary | ICD-10-CM | POA: Diagnosis not present

## 2022-03-02 DIAGNOSIS — E669 Obesity, unspecified: Secondary | ICD-10-CM | POA: Diagnosis not present

## 2022-03-02 DIAGNOSIS — I10 Essential (primary) hypertension: Secondary | ICD-10-CM | POA: Diagnosis not present

## 2022-03-02 DIAGNOSIS — R5383 Other fatigue: Secondary | ICD-10-CM

## 2022-03-02 DIAGNOSIS — Z6841 Body Mass Index (BMI) 40.0 and over, adult: Secondary | ICD-10-CM | POA: Diagnosis not present

## 2022-03-02 DIAGNOSIS — G4733 Obstructive sleep apnea (adult) (pediatric): Secondary | ICD-10-CM | POA: Diagnosis not present

## 2022-03-03 LAB — COMPREHENSIVE METABOLIC PANEL
ALT: 11 IU/L (ref 0–32)
AST: 12 IU/L (ref 0–40)
Albumin/Globulin Ratio: 2.1 (ref 1.2–2.2)
Albumin: 3.9 g/dL (ref 3.6–4.6)
Alkaline Phosphatase: 104 IU/L (ref 44–121)
BUN/Creatinine Ratio: 19 (ref 12–28)
BUN: 22 mg/dL (ref 8–27)
Bilirubin Total: 0.4 mg/dL (ref 0.0–1.2)
CO2: 24 mmol/L (ref 20–29)
Calcium: 7.2 mg/dL — ABNORMAL LOW (ref 8.7–10.3)
Chloride: 99 mmol/L (ref 96–106)
Creatinine, Ser: 1.13 mg/dL — ABNORMAL HIGH (ref 0.57–1.00)
Globulin, Total: 1.9 g/dL (ref 1.5–4.5)
Glucose: 121 mg/dL — ABNORMAL HIGH (ref 70–99)
Potassium: 4 mmol/L (ref 3.5–5.2)
Sodium: 141 mmol/L (ref 134–144)
Total Protein: 5.8 g/dL — ABNORMAL LOW (ref 6.0–8.5)
eGFR: 49 mL/min/{1.73_m2} — ABNORMAL LOW (ref >59)

## 2022-03-03 LAB — T4, FREE: Free T4: 1.49 ng/dL (ref 0.82–1.77)

## 2022-03-03 LAB — T3: T3, Total: 138 ng/dL (ref 71–180)

## 2022-03-03 LAB — TSH: TSH: 3.92 u[IU]/mL (ref 0.450–4.500)

## 2022-03-03 LAB — VITAMIN D 25 HYDROXY (VIT D DEFICIENCY, FRACTURES): Vit D, 25-Hydroxy: 57.1 ng/mL (ref 30.0–100.0)

## 2022-03-03 LAB — INSULIN, RANDOM: INSULIN: 24.7 u[IU]/mL (ref 2.6–24.9)

## 2022-03-03 NOTE — Progress Notes (Signed)
? ? ? ?Chief Complaint:  ? ?OBESITY ?LEGACY CARRENDER (MR# [id number]) is a 81 y.o. female who presents for evaluation and treatment of obesity and related comorbidities. Current BMI is Body mass index is 44.59 kg/m?Marland Kitchen Gloria Mercer has been struggling with her weight for many years and has been unsuccessful in either losing weight, maintaining weight loss, or reaching her healthy weight goal. ? ?Gloria Mercer was referred by Dr. Brita Romp. She notes gradual weight gain after 40 years ol. She skips lunch daily. She eats brunch type meal 1 hour after waking. Oatmeal or grits and coffee (1/4 cup of dried grits and 1/2 cup of oatmeal). Coffee with tablespoon creamer (1 tbsp). Dinner is hamburger patty 3 ounces, asparagus 14 spears, cabbage salad and iced tea 1/3 cup of sugar in 1/2 gallon and then chocolate pudding. After dinner eating cookies, cake, and ice cream 1 cup.   ? ?Gloria Mercer is currently in the action stage of change and ready to dedicate time achieving and maintaining a healthier weight. Gloria Mercer is interested in becoming our patient and working on intensive lifestyle modifications including (but not limited to) diet and exercise for weight loss. ? ?Gloria Mercer's habits were reviewed today and are as follows: Her family eats meals together, her desired weight loss is 36 pounds for now or less, she has been heavy most of her life, she started gaining weight about 81 years of age, her heaviest weight ever was 250 pounds, she has significant food cravings issues, she snacks frequently in the evenings, she skips meals frequently, she is frequently drinking liquids with calories, she frequently makes poor food choices, she has problems with excessive hunger, and she frequently eats larger portions than normal. ? ?Depression Screen ?Gloria Mercer's Food and Mood (modified PHQ-9) score was 9. ? ? ?  03/02/2022  ?  8:48 AM  ?Depression screen PHQ 2/9  ?Decreased Interest 3  ?Down, Depressed, Hopeless 0  ?PHQ - 2 Score 3  ?Altered sleeping 0  ?Tired,  decreased energy 2  ?Change in appetite 0  ?Feeling bad or failure about yourself  0  ?Trouble concentrating 1  ?Moving slowly or fidgety/restless 3  ?Suicidal thoughts 0  ?PHQ-9 Score 9  ?Difficult doing work/chores Very difficult  ? ?Subjective:  ? ?1. Other fatigue ?Gloria Mercer's EKG in September 2022 was normal sinus rhythm. Gloria Mercer admits to daytime somnolence and admits to waking up still tired. Patient has a history of symptoms of morning fatigue. Omie generally gets 6 or 7 hours of sleep per night, and states that she has difficulty falling asleep and generally restful sleep. Snoring is present. Apneic episodes are not present. Epworth Sleepiness Score is 8.   ? ?2. Shortness of breath on exertion ?Gloria Mercer's EKG in September 2022 was normal sinus rhythm. Rikayla notes increasing shortness of breath with exercising and seems to be worsening over time with weight gain. She notes getting out of breath sooner with activity than she used to. This has not gotten worse recently. Nivia denies shortness of breath at rest or orthopnea.  ? ?3. Hyperglycemia ?Gloria Mercer's last A1C was 5.9. Her last blood sugar was 142. ? ?4. Stage 3b chronic kidney disease ([place]) ?Gloria Mercer sees Dr. Lanora Manis at [facility] The plan is weight loss and follow up in July.  ? ?5. Essential hypertension ?Gloria Mercer's blood pressure is slightly elevated today 145/75. She is currently on hydralazine and  torsemide. She has had side effects of medications in the past so she is working with cardiology on medication management.  ? ?  6. Vitamin D deficiency ?Gloria Mercer is on 5,000 IU daily Vitamin D. She notes fatigue.  ? ?7. B12 deficiency ?Gloria Mercer is currently not taking B 12. She thinks she is supposed to take supplements but not sure.  ? ?8. Mixed hyperlipidemia ?Gloria Mercer was on ezetimibe ; she couldn't tolerate statin. Her  last LDL was 108. Her HDL was 62. Her Triglycerides was 159.  ? ?9. Obstructive sleep apnea syndrome ?Gloria Mercer was supposed to have  CPAP but she declined. She sees Dr. Halford Chessman.  ? ?Assessment/Plan:  ? ?1. Other fatigue ?We will check EKG, IC and labs today. Gloria Mercer does feel that her weight is causing her energy to be lower than it should be. Fatigue may be related to obesity, depression or many other causes. Labs will be ordered, and in the meanwhile, Gloria Mercer will focus on self care including making healthy food choices, increasing physical activity and focusing on stress reduction.  ?- T3 ?- TSH ?- T4, free ?- EKG 12-Lead ? ?2. Shortness of breath on exertion ?We will check IC and labs today. Gloria Mercer does feel that she gets out of breath more easily that she used to when she exercises. Gloria Mercer's shortness of breath appears to be obesity related and exercise induced. She has agreed to work on weight loss and gradually increase exercise to treat her exercise induced shortness of breath. Will continue to monitor closely.  ? ?3. Hyperglycemia ?We will check insulin level today.  ? ?- Insulin, random ? ?4. Stage 3b chronic kidney disease ([place]) ?Lab results and trends reviewed. We discussed several lifestyle modifications today and she will continue to work on diet, exercise and weight loss efforts. Avoid nephrotoxic medications. We will check CMP today. Orders and follow up as documented in patient record.  ? ?Counseling ?Chronic kidney disease (CKD) happens when the kidneys are damaged over a long period of time. ?Most of the time, this condition does not go away, but it can usually be controlled. Steps must be taken to slow down the kidney damage or to stop it from getting worse. ?Intensive lifestyle modifications are the first line treatment for this issue.  ?Avoid buying foods that are: processed, frozen, or prepackaged to avoid excess salt. ?  ?- Comprehensive metabolic panel ? ?5. Essential hypertension ?Gloria Mercer will follow up with cardiology. She is working on healthy weight loss and exercise to improve blood pressure control. We will watch for signs of  hypotension as she continues her lifestyle modifications. ? ?6. Vitamin D deficiency ?Low Vitamin D level contributes to fatigue and are associated with obesity, breast, and colon cancer. We will check Vitamin D level today and Gloria Mercer will follow-up for routine testing of Vitamin D, at least 2-3 times per year to avoid over-replacement. ? ?- VITAMIN D 25 Hydroxy (Vit-D Deficiency, Fractures) ? ?7. B12 deficiency ?The diagnosis was reviewed with the patient. We will follow up on whether Gloria Mercer started over the counter. Counseling provided today, see below. We will continue to monitor. Orders and follow up as documented in patient record. ? ?Counseling ?The body needs vitamin B12: to make red blood cells; to make DNA; and to help the nerves work properly so they can carry messages from the brain to the body.  ?The main causes of vitamin B12 deficiency include dietary deficiency, digestive diseases, pernicious anemia, and having a surgery in which part of the stomach or small intestine is removed.  ?Certain medicines can make it harder for the body to absorb vitamin B12. These medicines include:  heartburn medications; some antibiotics; some medications used to treat diabetes, gout, and high cholesterol.  ?In some cases, there are no symptoms of this condition. If the condition leads to anemia or nerve damage, various symptoms can occur, such as weakness or fatigue, shortness of breath, and numbness or tingling in your hands and feet.   ?Treatment:  ?May include taking vitamin B12 supplements.  ?Avoid alcohol.  ?Eat lots of healthy foods that contain vitamin B12: ?Beef, pork, chicken, [place], and organ meats, such as liver.  ?Seafood: This includes clams, rainbow trout, salmon, tuna, and haddock. Eggs.  ?Cereal and dairy products that are fortified: This means that vitamin B12 has been added to the food.   ? ?8. Mixed hyperlipidemia ?Cardiovascular risk and specific lipid/LDL goals reviewed.  We will repeat labs in 3  months. We discussed several lifestyle modifications today and Gloria Mercer will continue to work on diet, exercise and weight loss efforts. Orders and follow up as documented in patient record.  ? ?Counseling ?[place]

## 2022-03-09 ENCOUNTER — Ambulatory Visit (INDEPENDENT_AMBULATORY_CARE_PROVIDER_SITE_OTHER): Payer: BLUE CROSS/BLUE SHIELD | Admitting: Family Medicine

## 2022-03-15 ENCOUNTER — Ambulatory Visit: Payer: Self-pay | Admitting: *Deleted

## 2022-03-15 ENCOUNTER — Telehealth: Payer: Self-pay | Admitting: Family Medicine

## 2022-03-15 NOTE — Telephone Encounter (Signed)
It is safe for her to use MiraLAX with her kidney function and she should use this in the meantime while waiting for her appointment tomorrow.  She can start with 1 capful, but she can repeat that 1-2 more times today if needed to get things moving

## 2022-03-15 NOTE — Telephone Encounter (Signed)
Pt states she has severe constipation and would like a nurse to call her to advise something for relief until she can get in to see MD. She has taken Senokot and colace ?Reason for Disposition ? Abdomen is more swollen than usual ? ?Answer Assessment - Initial Assessment Questions ?1. STOOL PATTERN OR FREQUENCY: "How often do you have a bowel movement (BM)?"  (Normal range: 3 times a day to every 3 days)  "When was your last BM?"   ?    Last BM- 1 week ?2. STRAINING: "Do you have to strain to have a BM?"  ?    No- usually has 2-3 BM /day ?3. RECTAL PAIN: "Does your rectum hurt when the stool comes out?" If Yes, ask: "Do you have hemorrhoids? How bad is the pain?"  (Scale 1-10; or mild, moderate, severe) ?    Pressure at rectum ?4. STOOL COMPOSITION: "Are the stools hard?"  ?    Normally not ?5. BLOOD ON STOOLS: "Has there been any blood on the toilet tissue or on the surface of the BM?" If Yes, ask: "When was the last time?"  ?      ?6. CHRONIC CONSTIPATION: "Is this a new problem for you?"  If no, ask: "How long have you had this problem?" (days, weeks, months)  ?    3 weeks ago- stool softener ?7. CHANGES IN DIET OR HYDRATION: "Have there been any recent changes in your diet?" "How much fluids are you drinking on a daily basis?"  "How much have you had to drink today?" ?    2 weeks ago- patient had diarrhea- past Wednesday patient fell- she has changed her diet- not eating much at all ?8. MEDICATIONS: "Have you been taking any new medications?" "Are you taking any narcotic pain medications?" (e.g., Vicodin, Percocet, morphine, Dilaudid) ?    Started new medication- Duloxetine 30 mg  ?9. LAXATIVES: "Have you been using any stool softeners, laxatives, or enemas?"  If yes, ask "What, how often, and when was the last time?" ?    Stool softener ?10. ACTIVITY:  "How much walking do you do every day?"  "Has your activity level decreased in the past week?"  ?      Using walker at this time ?11. CAUSE: "What do you think  is causing the constipation?"  ?      unsure ?12. OTHER SYMPTOMS: "Do you have any other symptoms?" (e.g., abdominal pain, bloating, fever, vomiting) ?      bloated ?13. MEDICAL HISTORY: "Do you have a history of hemorrhoids, rectal fissures, or rectal surgery or rectal abscess?"   ?      no ?14. PREGNANCY: "Is there any chance you are pregnant?" "When was your last menstrual period?" ? ?Protocols used: Constipation-A-AH ? ?

## 2022-03-15 NOTE — Telephone Encounter (Signed)
Pt states she has severe constipation and would like a nurse to call her to advise something for relief until she can get in to see MD. She has taken Senokot and colace ?

## 2022-03-15 NOTE — Telephone Encounter (Signed)
?  Chief Complaint: constipation ?Symptoms: pressure at rectum, no BM- over 4 days, bloating ?Frequency: about 1 week ?Pertinent Negatives: Patient denies fever, abdominal pain, vomiting ?Disposition: '[]'$ ED /'[]'$ Urgent Care (no appt availability in office) / '[x]'$ Appointment(In office/virtual)/ '[]'$  [facility]/ '[]'$ Home Care/ '[]'$ Refused Recommended Disposition /'[]'$ [facility]/ '[]'$  Follow-up with PCP ?Additional Notes: Offered morning appointment- patient declined- patient is reluctant to use Miralax due to kidney disease- advised will send to PCP for review and advice  ?

## 2022-03-15 NOTE — Telephone Encounter (Signed)
See triage note.

## 2022-03-16 ENCOUNTER — Encounter: Payer: Self-pay | Admitting: Physician Assistant

## 2022-03-16 ENCOUNTER — Ambulatory Visit (INDEPENDENT_AMBULATORY_CARE_PROVIDER_SITE_OTHER): Payer: Medicare Other | Admitting: Physician Assistant

## 2022-03-16 ENCOUNTER — Ambulatory Visit (INDEPENDENT_AMBULATORY_CARE_PROVIDER_SITE_OTHER): Payer: Medicare Other | Admitting: Family Medicine

## 2022-03-16 VITALS — BP 140/80 | HR 87 | Temp 98.6°F | Resp 16 | Wt 226.8 lb

## 2022-03-16 DIAGNOSIS — K5900 Constipation, unspecified: Secondary | ICD-10-CM | POA: Diagnosis not present

## 2022-03-16 MED ORDER — LACTULOSE 10 GM/15ML PO SOLN: 10.0000 g | Freq: Two times a day (BID) | ORAL | 0 refills | Status: DC | PRN

## 2022-03-16 NOTE — Patient Instructions (Addendum)
I am sending in a script for you to take to assist with your Constipation ?It is called Lactulose  ?I recommend taking it up to twice per day as needed to help with your constipation ?Stay well hydrated and once you begin having bowel movements again, I recommend taking a bulking laxative such as metamucil or Benefiber to assist with regular bowel movements  ?If you start developing the following please go to the ED: fever, lightheadedness, increased abdominal pain, blood in the rectum, vomiting, abdominal distention,  ? ?Let us know if you have further issues or concerns ?It was nice to meet you and I appreciate the opportunity to be involved in your care  ? ?

## 2022-03-16 NOTE — Assessment & Plan Note (Signed)
Acute, recurrent issue ?Reports she has not had bowel movement in almost a week ?Report symptoms of rectal discomfort, bloating, appetite changes, fatigue ?Recommend Lactulose 10 mg/15 mL PO BID until she has bowel movement ?Once bowel movement occurs recommend using Miralax until she is having regular bowel movements then using bulking agent to maintain ?Reviewed ED precautions for potential fecal impaction  ?Follow up as needed for persistent or worsening symptoms  ? ? ? ?

## 2022-03-16 NOTE — Progress Notes (Signed)
?  ? ?I,Joseline E Rosas,acting as a scribe for [employer], PA-C.,have documented all relevant documentation on the behalf of [place], PA-C,as directed by  [employer], PA-C while in the presence of Alvino Lechuga E Samer Dutton, PA-C.  ? ?Established patient visit ? ? ?Patient: Gloria Mercer   DOB: 18-Mar-1941   81 y.o. Female  MRN: [id number] ?Visit Date: 03/16/2022 ? ?Today's healthcare provider: Dani Gobble Nicolle Heward, PA-C  ?Introduced myself to the patient as a Journalist, newspaper and provided education on APPs in clinical practice.  ? ? ?Chief Complaint  ?Patient presents with  ? Constipation  ? ?Subjective  ?  ?HPI  ?Constipation: Patient complains of constipation.  Stool pattern has been 0  no bowels  . Onset was 1 week ago Defecation has been difficult. Symptoms have been symptoms have progressed to a point and plateaued. Current Health Habits: Eating fiber? no Exercise?no Water intake? yes, amt 3-4 bottles a day. Current OTC/RX therapy has been  senokot, prune juice, miralax and laxative  which has been not very effective.  ? ?Reports she has had concerns for constipation ?States she thinks it's been about a week since she had a bowel movement but began taking things Sat to help ?She reports she has not had any stool passage since about Monday or Tuesday ?She reports she has had trouble with constipation roughly 3 weeks ago, then she started a new diet recommended by the lifestyle center ? ?Normally she has a bowel movement everyday - frequently more than one bowel movement per day ?She is passing flatus every now and then ? ?Interventions: Senokot S, Prune juice, Colace, Miralax last night ?States she feels the urge to go to the restroom but feels unable to pass stool ?States she feels exhausted when she tries to have bowel movement ? ? ?Medications: ?Outpatient Medications Prior to Visit  ?Medication Sig  ? acetaminophen (TYLENOL) 500 MG tablet Take 1,000 mg by mouth daily as needed.  ? azelastine (ASTELIN) 0.1 % nasal spray Place 2  sprays into both nostrils daily.  ? Cholecalciferol (VITAMIN D3) 5000 units CAPS Take 1 capsule by mouth daily.  ? clotrimazole-betamethasone (LOTRISONE) cream APPLY EXTERNALLY TO THE AFFECTED AREA TWICE DAILY AS NEEDED  ? DULoxetine (CYMBALTA) 30 MG capsule Take by mouth.  ? esomeprazole (NEXIUM) 20 MG capsule Take 40 mg by mouth daily at 12 noon.   ? ezetimibe (ZETIA) 10 MG tablet TAKE 1 TABLET(10 MG) BY MOUTH DAILY  ? hydrALAZINE (APRESOLINE) 50 MG tablet Take 50 mg by mouth 3 (three) times daily.  ? ipratropium (ATROVENT) 0.03 % nasal spray USE 2 SPRAYS IN EACH NOSTRIL EVERY 8 HOURS AS NEEDED  ? levocetirizine (XYZAL) 5 MG tablet Take 5 mg by mouth every evening.  ? montelukast (SINGULAIR) 10 MG tablet TAKE 1 TABLET(10 MG) BY MOUTH DAILY  ? Multiple Vitamins-Minerals (EYE VITAMINS PO) Take 2 tablets by mouth daily.   ? potassium chloride SA (KLOR-CON) 20 MEQ tablet Take 1 tablet (20 mEq total) by mouth daily.  ? pramipexole (MIRAPEX) 0.75 MG tablet Take 1 tablet (0.75 mg total) by mouth at bedtime.  ? Tiotropium Bromide Monohydrate (SPIRIVA RESPIMAT) 2.5 MCG/ACT AERS Inhale 2 puffs into the lungs daily.  ? torsemide (DEMADEX) 20 MG tablet Take 2 tablets (40 mg total) by mouth daily.  ? triamcinolone (NASACORT) 55 MCG/ACT AERO nasal inhaler Place 2 sprays into the nose daily.  ? triamcinolone ointment (KENALOG) 0.5 % Apply 1 application topically 2 (two) times daily.  ? ?  No facility-administered medications prior to visit.  ? ? ?Review of Systems  ?Constitutional:  Positive for appetite change, chills and fatigue. Negative for fever.  ?Gastrointestinal:  Positive for abdominal distention, abdominal pain, constipation and rectal pain (with pushing). Negative for nausea and vomiting.  ?Neurological:  Negative for dizziness, syncope and light-headedness.  ? ? ?  Objective  ?  ?BP 140/80 (BP Location: Left Wrist, Patient Position: Sitting, Cuff Size: Normal)   Pulse 87   Temp 98.6 ?F (37 ?C) (Oral)   Resp 16   Wt  226 lb 12.8 oz (102.9 kg)   SpO2 96%   BMI 42.85 kg/m?  ? ? ?Physical Exam ?Vitals reviewed.  ?Constitutional:   ?   General: She is awake.  ?   Appearance: Normal appearance. She is well-developed and well-groomed. She is morbidly obese.  ?HENT:  ?   Head: Normocephalic and atraumatic.  ?Cardiovascular:  ?   Rate and Rhythm: Normal rate and regular rhythm.  ?   Heart sounds: Normal heart sounds.  ?Pulmonary:  ?   Effort: Pulmonary effort is normal.  ?   Breath sounds: Normal breath sounds. No decreased breath sounds, wheezing or rhonchi.  ?Abdominal:  ?   General: Abdomen is protuberant. Bowel sounds are normal.  ?   Palpations: Abdomen is rigid.  ?   Tenderness: There is generalized abdominal tenderness. There is no guarding. Negative signs include McBurney's sign.  ?Neurological:  ?   Mental Status: She is alert.  ?Psychiatric:     ?   Attention and Perception: Attention normal.     ?   Mood and Affect: Mood and affect normal.     ?   Speech: Speech normal.     ?   Behavior: Behavior normal. Behavior is cooperative.  ?  ? ? ? ?No results found for any visits on 03/16/22. ? Assessment & Plan  ?  ? ?Problem List Items Addressed This Visit   ? ?  ? Other  ? Constipation - Primary  ?  Acute, recurrent issue ?Reports she has not had bowel movement in almost a week ?Report symptoms of rectal discomfort, bloating, appetite changes, fatigue ?Recommend Lactulose 10 mg/15 mL PO BID until she has bowel movement ?Once bowel movement occurs recommend using Miralax until she is having regular bowel movements then using bulking agent to maintain ?Reviewed ED precautions for potential fecal impaction  ?Follow up as needed for persistent or worsening symptoms  ? ? ? ? ?  ?  ? Relevant Medications  ? lactulose (CHRONULAC) 10 GM/15ML solution  ? ? ? ?No follow-ups on file. ? ? ?I, Merrilyn Legler E Jesi Jurgens, PA-C, have reviewed all documentation for this visit. The documentation on 03/16/22 for the exam, diagnosis, procedures, and orders are  all accurate and complete. ? ?Alasha Mcguinness, MHS, PA-C ?[facility] ?[facility]  ? ? ? ?

## 2022-03-17 ENCOUNTER — Telehealth: Payer: Self-pay

## 2022-03-17 NOTE — Telephone Encounter (Signed)
Copied from [place] [id number]. Topic: General - Inquiry ?>> Mar 17, 2022  2:19 PM Loma [place] wrote: ?Reason for CRM: Pt had appt yesterday with Junie Panning and has still a few questions for her  re her situation or if her nurse could ask  for her. She wants fu call at [phone] ?

## 2022-03-17 NOTE — Telephone Encounter (Signed)
Patient wants to know if there is somewhere else that can help her with constipation. Patient advised that Dr. B is not in the office. I did advise to keep appt for tomorrow.  ?

## 2022-03-18 ENCOUNTER — Emergency Department
Admission: EM | Admit: 2022-03-18 | Discharge: 2022-03-18 | Disposition: A | Discharge status: Home / Self Care | Payer: Medicare Other | Attending: Emergency Medicine | Admitting: Emergency Medicine

## 2022-03-18 ENCOUNTER — Ambulatory Visit (INDEPENDENT_AMBULATORY_CARE_PROVIDER_SITE_OTHER): Payer: Medicare Other | Admitting: Family Medicine

## 2022-03-18 ENCOUNTER — Other Ambulatory Visit: Payer: Self-pay

## 2022-03-18 ENCOUNTER — Other Ambulatory Visit: Payer: Self-pay | Admitting: *Deleted

## 2022-03-18 ENCOUNTER — Telehealth: Payer: Medicare Other | Admitting: Family Medicine

## 2022-03-18 ENCOUNTER — Emergency Department: Payer: Medicare Other

## 2022-03-18 DIAGNOSIS — S40012A Contusion of left shoulder, initial encounter: Secondary | ICD-10-CM | POA: Diagnosis not present

## 2022-03-18 DIAGNOSIS — M51369 Other intervertebral disc degeneration, lumbar region without mention of lumbar back pain or lower extremity pain: Secondary | ICD-10-CM

## 2022-03-18 DIAGNOSIS — R296 Repeated falls: Secondary | ICD-10-CM | POA: Diagnosis not present

## 2022-03-18 DIAGNOSIS — M40204 Unspecified kyphosis, thoracic region: Secondary | ICD-10-CM | POA: Diagnosis not present

## 2022-03-18 DIAGNOSIS — K8689 Other specified diseases of pancreas: Secondary | ICD-10-CM | POA: Diagnosis not present

## 2022-03-18 DIAGNOSIS — K760 Fatty (change of) liver, not elsewhere classified: Secondary | ICD-10-CM | POA: Diagnosis not present

## 2022-03-18 DIAGNOSIS — I130 Hypertensive heart and chronic kidney disease with heart failure and stage 1 through stage 4 chronic kidney disease, or unspecified chronic kidney disease: Secondary | ICD-10-CM | POA: Diagnosis not present

## 2022-03-18 DIAGNOSIS — M5136 Other intervertebral disc degeneration, lumbar region: Secondary | ICD-10-CM | POA: Diagnosis not present

## 2022-03-18 DIAGNOSIS — W1839XA Other fall on same level, initial encounter: Secondary | ICD-10-CM | POA: Insufficient documentation

## 2022-03-18 DIAGNOSIS — N183 Chronic kidney disease, stage 3 unspecified: Secondary | ICD-10-CM | POA: Diagnosis not present

## 2022-03-18 DIAGNOSIS — R600 Localized edema: Secondary | ICD-10-CM | POA: Insufficient documentation

## 2022-03-18 DIAGNOSIS — K5900 Constipation, unspecified: Secondary | ICD-10-CM | POA: Diagnosis not present

## 2022-03-18 DIAGNOSIS — D734 Cyst of spleen: Secondary | ICD-10-CM | POA: Diagnosis not present

## 2022-03-18 DIAGNOSIS — I10 Essential (primary) hypertension: Secondary | ICD-10-CM | POA: Diagnosis not present

## 2022-03-18 DIAGNOSIS — I509 Heart failure, unspecified: Secondary | ICD-10-CM | POA: Diagnosis not present

## 2022-03-18 DIAGNOSIS — S5012XA Contusion of left forearm, initial encounter: Secondary | ICD-10-CM | POA: Insufficient documentation

## 2022-03-18 DIAGNOSIS — D631 Anemia in chronic kidney disease: Secondary | ICD-10-CM | POA: Diagnosis not present

## 2022-03-18 DIAGNOSIS — W19XXXA Unspecified fall, initial encounter: Secondary | ICD-10-CM

## 2022-03-18 DIAGNOSIS — R519 Headache, unspecified: Secondary | ICD-10-CM | POA: Diagnosis not present

## 2022-03-18 DIAGNOSIS — R270 Ataxia, unspecified: Secondary | ICD-10-CM | POA: Diagnosis not present

## 2022-03-18 DIAGNOSIS — S0003XA Contusion of scalp, initial encounter: Secondary | ICD-10-CM | POA: Diagnosis not present

## 2022-03-18 DIAGNOSIS — S4992XA Unspecified injury of left shoulder and upper arm, initial encounter: Secondary | ICD-10-CM | POA: Diagnosis present

## 2022-03-18 DIAGNOSIS — K869 Disease of pancreas, unspecified: Secondary | ICD-10-CM | POA: Diagnosis not present

## 2022-03-18 DIAGNOSIS — K6289 Other specified diseases of anus and rectum: Secondary | ICD-10-CM | POA: Diagnosis not present

## 2022-03-18 DIAGNOSIS — S0990XA Unspecified injury of head, initial encounter: Secondary | ICD-10-CM | POA: Diagnosis present

## 2022-03-18 LAB — CBC WITH DIFFERENTIAL/PLATELET
Abs Immature Granulocytes: 0.02 10*3/uL (ref 0.00–0.07)
Basophils Absolute: 0.1 10*3/uL (ref 0.0–0.1)
Basophils Relative: 1 %
Eosinophils Absolute: 0.2 10*3/uL (ref 0.0–0.5)
Eosinophils Relative: 3 %
HCT: 36.3 % (ref 36.0–46.0)
Hemoglobin: 12.2 g/dL (ref 12.0–15.0)
Immature Granulocytes: 0 %
Lymphocytes Relative: 26 %
Lymphs Abs: 1.9 10*3/uL (ref 0.7–4.0)
MCH: 29.3 pg (ref 26.0–34.0)
MCHC: 33.6 g/dL (ref 30.0–36.0)
MCV: 87.1 fL (ref 80.0–100.0)
Monocytes Absolute: 0.6 10*3/uL (ref 0.1–1.0)
Monocytes Relative: 8 %
Neutro Abs: 4.6 10*3/uL (ref 1.7–7.7)
Neutrophils Relative %: 62 %
Platelets: 308 10*3/uL (ref 150–400)
RBC: 4.17 MIL/uL (ref 3.87–5.11)
RDW: 13.4 % (ref 11.5–15.5)
WBC: 7.3 10*3/uL (ref 4.0–10.5)
nRBC: 0 % (ref 0.0–0.2)

## 2022-03-18 LAB — URINALYSIS, COMPLETE (UACMP) WITH MICROSCOPIC
Bacteria, UA: NONE SEEN
Bilirubin Urine: NEGATIVE
Glucose, UA: NEGATIVE mg/dL
Hgb urine dipstick: NEGATIVE
Ketones, ur: NEGATIVE mg/dL
Nitrite: NEGATIVE
Protein, ur: NEGATIVE mg/dL
Specific Gravity, Urine: 1.004 — ABNORMAL LOW (ref 1.005–1.030)
pH: 6 (ref 5.0–8.0)

## 2022-03-18 LAB — COMPREHENSIVE METABOLIC PANEL
ALT: 14 U/L (ref 0–44)
AST: 18 U/L (ref 15–41)
Albumin: 3.4 g/dL — ABNORMAL LOW (ref 3.5–5.0)
Alkaline Phosphatase: 89 U/L (ref 38–126)
Anion gap: 10 (ref 5–15)
BUN: 18 mg/dL (ref 8–23)
CO2: 25 mmol/L (ref 22–32)
Calcium: 9.1 mg/dL (ref 8.9–10.3)
Chloride: 98 mmol/L (ref 98–111)
Creatinine, Ser: 0.96 mg/dL (ref 0.44–1.00)
GFR, Estimated: 59 mL/min — ABNORMAL LOW (ref >60)
Glucose, Bld: 129 mg/dL — ABNORMAL HIGH (ref 70–99)
Potassium: 3.3 mmol/L — ABNORMAL LOW (ref 3.5–5.1)
Sodium: 133 mmol/L — ABNORMAL LOW (ref 135–145)
Total Bilirubin: 0.9 mg/dL (ref 0.3–1.2)
Total Protein: 6.4 g/dL — ABNORMAL LOW (ref 6.5–8.1)

## 2022-03-18 LAB — MAGNESIUM: Magnesium: 1.9 mg/dL (ref 1.7–2.4)

## 2022-03-18 LAB — LIPASE, BLOOD: Lipase: 30 U/L (ref 11–51)

## 2022-03-18 MED ORDER — LIDOCAINE HCL URETHRAL/MUCOSAL 2 % EX GEL
1.0000 "application " | Freq: Once | CUTANEOUS | Status: AC
  Administered 2022-03-18: 1
  Filled 2022-03-18: qty 10

## 2022-03-18 MED ORDER — IOHEXOL 300 MG/ML  SOLN
100.0000 mL | Freq: Once | INTRAMUSCULAR | Status: AC | PRN
  Administered 2022-03-18: 100 mL via INTRAVENOUS

## 2022-03-18 MED ORDER — ONDANSETRON HCL 4 MG/2ML IJ SOLN
4.0000 mg | Freq: Once | INTRAMUSCULAR | Status: AC
Start: 2022-03-18 — End: 2022-03-18
  Administered 2022-03-18: 4 mg via INTRAVENOUS
  Filled 2022-03-18: qty 2

## 2022-03-18 MED ORDER — LACTATED RINGERS IV BOLUS
500.0000 mL | Freq: Once | INTRAVENOUS | Status: AC
  Administered 2022-03-18: 500 mL via INTRAVENOUS

## 2022-03-18 MED ORDER — POTASSIUM CHLORIDE CRYS ER 20 MEQ PO TBCR
40.0000 meq | EXTENDED_RELEASE_TABLET | Freq: Once | ORAL | Status: AC
  Administered 2022-03-18: 40 meq via ORAL
  Filled 2022-03-18: qty 2

## 2022-03-18 MED ORDER — TORSEMIDE 20 MG PO TABS: 40.0000 mg | ORAL_TABLET | Freq: Every day | ORAL | 0 refills | Status: DC

## 2022-03-18 NOTE — Telephone Encounter (Signed)
Looks like she was seen in ED today and had fecal disimpaction. ?

## 2022-03-18 NOTE — ED Notes (Signed)
Pt to CT

## 2022-03-18 NOTE — ED Triage Notes (Signed)
Pt c/o constipation for the past 10 days, states she has taken OTC meds and prescribed meds with no relief, having some abd discomfort with nausea ?

## 2022-03-18 NOTE — ED Notes (Signed)
Pt assisted to the bathroom with 1-person assist.  ?

## 2022-03-18 NOTE — ED Provider Notes (Signed)
? ?[facility] ?Provider Note ? ? ? Event Date/Time  ? First MD Initiated Contact with Patient 03/18/22 [phone]   ?  (approximate) ? ? ?History  ? ?Abdominal Pain and Constipation ? ? ?HPI ? ?Gloria Mercer is a 81 y.o. female with past medical history of CHF, HTN, HDL, RLS, arthritis, anemia, OSA, CKD and peptic ulcer disease as well as GERD who presents for evaluation of abdominal pain and constipation.  Patient states he has not had a bowel movement in 10 to 12 days.  He states he has been taking 1 cap of MiraLAX per day as well as Senokot and has been taking lactulose that was prescribed by her PCP but this has not helped.  She states her abdomen is gotten fairly tight and is uncomfortable throughout and she has had some nausea but no vomiting.  She does note she fell about 8 days ago and her walker collapsed at that time she had her head turned left shoulder.  She also states she had some pain in her lower back.  She states these pains have all subsided but still is a little bruising on the left shoulder and left arm.  She denies any new chest pain, change in her chronic cough, current headache, earache, sore throat, burning with urination but states she has had some hesitancy.  No other acute extremity pain or other recent falls.  She states she think she is on a blood thinner but is not sure which one. ? ?I did review recent PCP note from 4/18 where patient was evaluated for this as well and seems he was prescribed some lactulose at that time. ? ?  ?Past Medical History:  ?Diagnosis Date  ? Allergic rhinitis   ? on allergy shots, sees Dr. Pryor Ochoa  ? Anemia   ? Arthritis   ? B12 deficiency   ? Back pain   ? Bilateral edema of lower extremity   ? Cataract   ? CHF (congestive heart failure) ([place])   ? Constipation   ? Edema   ? Gallbladder problem   ? GERD (gastroesophageal reflux disease)   ? History of stomach ulcers   ? Hyperlipidemia   ? Hypertension   ? Joint pain   ? Macular degeneration    ? followed by Dr Garwin Brothers at [facility]  ? Osteoarthritis   ? Other fatigue   ? Prediabetes   ? Proteinuria   ? RLS (restless legs syndrome)   ? Sciatica   ? Shortness of breath   ? Shortness of breath on exertion   ? Sleep apnea   ? Stage 3 chronic kidney disease ([place])   ? Swallowing difficulty   ? Vitamin D deficiency   ? ? ? ?Physical Exam  ?Triage Vital Signs: ?ED Triage Vitals  ?Enc Vitals Group  ?   BP   ?   Pulse   ?   Resp   ?   Temp   ?   Temp src   ?   SpO2   ?   Weight   ?   Height   ?   Head Circumference   ?   Peak Flow   ?   Pain Score   ?   Pain Loc   ?   Pain Edu?   ?   Excl. in [place]?   ? ? ?Most recent vital signs: ?Vitals:  ? 03/18/22 1105 03/18/22 1200  ?BP: (!) 146/66 (!) 145/76  ?Pulse: 71 68  ?  Resp: 18 18  ?Temp:    ?SpO2: 97% 100%  ? ? ?General: Awake, no distress.  ?CV:  Good peripheral perfusion.  2+ radial pulse. ?Resp:  Normal effort.  ?Abd:  No slightly distended and mildly tender throughout with no rigidity or guarding or clear peritonitis.  No CVA tenderness. ?Other:  No obvious trauma to the face scalp head or neck.  Cranial nerves II through XII are grossly intact.  There is some scant superficial bruising over the left forearm and shoulder.  Patient otherwise has full strength and range of motion of the bilateral lower extremities.  Sensation is intact to light touch throughout.  2+ bilateral lower extremity edema. ? ? ?ED Results / Procedures / Treatments  ?Labs ?(all labs ordered are listed, but only abnormal results are displayed) ?Labs Reviewed  ?COMPREHENSIVE METABOLIC PANEL - Abnormal; Notable for the following components:  ?    Result Value  ? Sodium 133 (*)   ? Potassium 3.3 (*)   ? Glucose, Bld 129 (*)   ? Total Protein 6.4 (*)   ? Albumin 3.4 (*)   ? GFR, Estimated 59 (*)   ? All other components within normal limits  ?URINALYSIS, COMPLETE (UACMP) WITH MICROSCOPIC - Abnormal; Notable for the following components:  ? Color, Urine STRAW (*)   ? APPearance CLEAR (*)   ? Specific  Gravity, Urine 1.004 (*)   ? Leukocytes,Ua TRACE (*)   ? All other components within normal limits  ?CBC WITH DIFFERENTIAL/PLATELET  ?LIPASE, BLOOD  ?MAGNESIUM  ? ? ? ?EKG ? ?ECG is remarkable sinus rhythm with a ventricular rate of 88, normal axis, with a.  PR interval of 158 and unremarkable QTc without clear evidence of acute ischemia or significant arrhythmia. ? ? ?RADIOLOGY ? ?CT head and C-spine on my review without evidence of skull fracture, intracranial hemorrhage, ischemia or acute C-spine injury.  I viewed radiology interpretation and agree to findings of small tone on the right parietal scalp as well as some degenerative changes in the C-spine but no other acute C-spine injury. ? ?CT abdomen pelvis on my review shows fairly significant stool burden including in the rectum without evidence of an SBO, clear ileus, diverticulitis, kidney stone, appendicitis or other acute abdominal or pelvic process.  I reviewed radiologist rotation and agree with their findings of some mild diffuse thickening in the rectum just a nonspecific proctitis as well as an incidental 1 cm low-density structure in the body of the pancreas and what appeared to be small foci in the spleen representing cysts or hemangiomas as well as lumbar spondylosis and severe spinal stenosis with encroachment of neuroforaminal foramen at L4 and L5. ? ?Dedicated CT T and L-spine on my review without any evidence of acute thoracic spine fracture and possible compression fracture of L5.  I reviewed radiologist rotation and note AC appears to be an acute compression fracture 3% height loss of L5 and 50% height loss at L4 with multiple degenerative changes and some anterolisthesis with disc bulging and facet arthropathy as well as severe spinal canal stenosis and moderate bilateral foraminal foraminal stenosis. ? ?PROCEDURES: ? ?Critical Care performed: No ? ?Fecal disimpaction ? ?Date/Time: 03/18/2022 12:22 PM ?Performed by: Lucrezia Starch,  MD ?Authorized by: Lucrezia Starch, MD  ?Consent: Verbal consent obtained. ?Consent given by: patient ?Patient understanding: patient states understanding of the procedure being performed ?Local anesthesia used: yes ? ?Anesthesia: ?Local anesthesia used: yes ?Local Anesthetic: topical anesthetic ? ?Sedation: ?Patient sedated: no ? ?  Patient tolerance: patient tolerated the procedure well with no immediate complications ? ? ?.1-3 Lead EKG Interpretation ?Performed by: Lucrezia Starch, MD ?Authorized by: Lucrezia Starch, MD  ? ?  Interpretation: normal   ?  ECG rate assessment: normal   ?  Rhythm: sinus rhythm   ?  Ectopy: none   ?  Conduction: normal   ?The patient is on the cardiac monitor to evaluate for evidence of arrhythmia and/or significant heart rate changes. ? ? ?MEDICATIONS ORDERED IN ED: ?Medications  ?lactated ringers bolus 500 mL (0 mLs Intravenous Stopped 03/18/22 1009)  ?ondansetron (ZOFRAN) injection 4 mg (4 mg Intravenous Given 03/18/22 1021)  ?potassium chloride SA (KLOR-CON M) CR tablet 40 mEq (40 mEq Oral Given 03/18/22 1023)  ?iohexol (OMNIPAQUE) 300 MG/ML solution 100 mL (100 mLs Intravenous Contrast Given 03/18/22 1037)  ?lidocaine (XYLOCAINE) 2 % jelly 1 application. (1 application. Other Given 03/18/22 1147)  ? ? ? ?IMPRESSION / MDM / ASSESSMENT AND PLAN / ED COURSE  ?I reviewed the triage vital signs and the nursing notes. ?             ?               ? ?With regard to patient's fall last week while she does not have any current pain given her age and that she has not been evaluated for this and had headache and back pain for couple days with new constipation and concern for possible occult head injury, C-spine injury or lower back injury.  She does not have any new cardiac or respiratory symptoms and otherwise is no obvious evidence of any significant, to the extremities.  She is otherwise neurovascular intact.  Unclear if she could have sustained spinal injury this could be contributing to  her constipation.  She has not had any incontinence.  EKG not suggestive of ischemia or significant arrhythmia. ? ?Additional considerations for constipation abdominal distention include ileus, SBO, diverticulitis

## 2022-03-18 NOTE — Discharge Instructions (Addendum)
When you wake up: ?Begin a liquid diet. (See list below for suggestions.) ?Take 2 Dulcolax (bisacodyl) tablets. (DO NOT CHEW.) ?Begin MiraLAX on a daily basis per instructions ?1 hour after waking up: ?Mix the entire 238-gram bottle of MiraLAX with 64 ounces of a sports drink. ?Drink all of the mixture over the next few hours until gone. (Suggestion: An 8-ounce glass every 15-30 minutes equals 2-4 hours.) ?It is very important to drink plenty of water and other liquids in order to avoid dehydration and to flush the bowel. (Although alcohol is a liquid, it can make you dehydrated. You should NOT drink alcohol while doing the cleanout.) ?NOTE: Please stay home once you have started your cleanout. Also, the use of moist towelettes or wipes may help to minimize discomfort during the cleanout. A nonprescription 1% hydrocortisone cream may also be soothing when applied to the rectal area after each bowel movement. ?It is common during the cleanout to experience some nausea, bloating, and/or abdominal distention. If you chilled the mixture prior to drinking it, you could experience chills from consuming so much cold liquid in a short time period. If you develop nausea or vomiting, slow down the rate at which you drink the solution. Please attempt to drink all of the laxative solution even if it takes you longer. ?Once stooling slows down, you may resume eating solid food. ? ? ? ?Your Ct of your Abdomen and Pelvis today showed: ?FINDINGS: ?There are few nodular densities in both lower lung fields measuring ?less than 5 mm in size. No follow-up needed if patient is low-risk ?(and has no known or suspected primary neoplasm). Non-contrast chest ?CT can be considered in 12 months if patient is high-risk. This ?recommendation follows the consensus statement: Guidelines for ?Management of Incidental Pulmonary Nodules Detected on CT Images: ?From the Fleischner Society 2017; Radiology 2017; 284:228-243. ?  ?  ?Hepatobiliary: No  focal abnormality is seen in the liver. Surgical ?clips are seen in gallbladder fossa. There is no significant ?dilation of bile ducts. ?  ?Pancreas: There is fatty infiltration. In image 23 of series 2, ?there is 10 mm low-density in the body of pancreas. ?  ?Spleen: There are 2 small low-density foci each measuring less than ?1.7 cm in the spleen. These may suggest cysts or hemangiomas. ?  ?Adrenals/Urinary Tract: Adrenals are unremarkable. Is no ?hydronephrosis. There are no renal or ureteral stones. There is ?possible subcentimeter cyst in the midportion of left kidney. ?Urinary bladder is unremarkable. ?  ?Stomach/Bowel: There is moderate sized fixed hiatal hernia. Small ?bowel loops are unremarkable. Appendix is unremarkable. There is no ?significant wall thickening in the colon. There is no pericolic ?stranding. There is mild diffuse wall thickening in the rectum. ?There is no stranding in the fat planes around the rectum. ?  ?Vascular/Lymphatic: Scattered arterial calcifications are seen. ?  ?Reproductive: Unremarkable. There are metallic densities in both ?sides of pelvis suggesting previous tubal ligation. ?  ?Other: There is no ascites or pneumoperitoneum. Small umbilical ?hernia containing fat is seen. ?  ?Musculoskeletal: Degenerative changes are noted in the lumbar spine, ?particularly severe at L4-L5 level with spinal stenosis. There is ?encroachment of neural foramina at multiple levels in the lumbar ?spine. There is first-degree anterolisthesis at L4-L5 level. ?  ?IMPRESSION: ?There is no evidence of intestinal obstruction or pneumoperitoneum. ?There is no hydronephrosis. Appendix is not dilated. ?  ?There is mild diffuse wall thickening in the rectum, possibly ?suggesting nonspecific acute or chronic proctitis. There is no ?significant  stranding in the fat planes or fluid collections in the ?perirectal region. ?  ?There is 1 cm low-density structure in the body of pancreas which ?may suggest  pancreatic cyst or cystic neoplasm. Short-term follow-up ?CT or MRI in 3 months may be considered. ?  ?There are small low-density foci in the spleen, possibly cysts or ?hemangiomas. ?  ?Lumbar spondylosis particularly severe with spinal stenosis and ?encroachment of neural foramina at L4-L5 level. ? ?CT your thoracic and lumbar spine showed: ?\IMPRESSION: ?CT THORACIC SPINE IMPRESSION ? ?No acute thoracic spine fracture. ? ?Findings of diffuse idiopathic skeletal hyperostosis. ? ?Mild multilevel degenerative disc disease and facet arthropathy. ? ?CT LUMBAR SPINE IMPRESSION ? ?Acute asymmetric left superior endplate compression fracture of L5 ?with approximately 30% height loss. ? ?Anterior compression fracture of L4 with approximately 15% height ?loss. ? ?Multilevel degenerative changes of the lumbar spine, worst at L4-L5 ?where there is degenerative grade 1 anterolisthesis with broad-based ?disc bulging and facet arthropathy resulting in severe spinal canal ?stenosis and moderate-severe bilateral neural foraminal stenosis.  ? ?As we discussed I reviewed these images with neurosurgery and he does not feel these are necessarily acute but you can follow-up in the clinic if you are still having any ongoing lower back pain or injuries worsening. ?

## 2022-03-19 ENCOUNTER — Ambulatory Visit: Payer: Medicare Other | Admitting: Cardiology

## 2022-03-22 ENCOUNTER — Telehealth: Payer: Self-pay

## 2022-03-22 ENCOUNTER — Other Ambulatory Visit: Payer: Self-pay | Admitting: Family Medicine

## 2022-03-22 NOTE — Telephone Encounter (Signed)
Copied from [place] [phone]. Topic: General - Other ?>> Mar 22, 2022 10:28 AM McGill, Nelva Bush wrote: ?Reason for CRM: Pt is requesting a hospital follow-up appointment with Dr.B this week. Pt was seen at [facility] and discharged on 04/20. No appointments are available for hospital follow up with Dr.B this week.? ? ?Pt stated she only wants to see her PCP Dr.B.? ? ?Please advise. ?

## 2022-03-22 NOTE — Telephone Encounter (Signed)
Called Gloria Mercer and offered appointment with Dr.Rumball. Gloria Mercer declined.Per Gloria Mercer will wait on Dr. B and requested appointment be made with her please no one else because she is familiar with her health. ?

## 2022-03-31 NOTE — Progress Notes (Signed)
?  ? ?I,Sulibeya S Dimas,acting as a scribe for Lavon Paganini, MD.,have documented all relevant documentation on the behalf of Lavon Paganini, MD,as directed by  Lavon Paganini, MD while in the presence of Lavon Paganini, MD. ? ? ?Established patient visit ? ? ?Patient: Gloria Mercer   DOB: 1941/10/16   81 y.o. Female  MRN: [id number] ?Visit Date: 04/02/2022 ? ?Today's healthcare provider: Lavon Paganini, MD  ? ?Chief Complaint  ?Patient presents with  ? Follow-up  ? ?Subjective  ?  ?HPI  ?Follow up ER visit ? ?Patient was seen in ER for constipation on 03/18/22. ?She was treated for constipation. ?Treatment for this included MiraLAX cleanout . ?She reports excellent compliance with treatment. ?She reports this condition is Improved. ? ?Incidental findings on CT scans: ?multiple pulm nodules <67m - no f/u if low risk, non-con CT in 1 yr if high risk - hasn't smoked since 1977 - smoked for less <10 yrs ?1cm mass (low density structure) in body of pancreas - cyst vs cystic neoplasm - CT or MRI in 373mLumbar spondylosis with severe spinal stenosis and encroachment of neural foramina at L4-L5 level ?Acute asymmetric left superior endplate compression fracture of L5 ?with approximately 30% height loss. ?Anterior compression fracture of L4 with approximately 15% height ?loss. ?----------------------------------------------------------------------------------------- ?R sided posterior cervical LN - enlarged for years but getting bigger x months.  Non-tender. ? ?Medications: ?Outpatient Medications Prior to Visit  ?Medication Sig  ? azelastine (ASTELIN) 0.1 % nasal spray Place 2 sprays into both nostrils daily.  ? Cholecalciferol (VITAMIN D3) 5000 units CAPS Take 1 capsule by mouth daily.  ? clotrimazole-betamethasone (LOTRISONE) cream APPLY EXTERNALLY TO THE AFFECTED AREA TWICE DAILY AS NEEDED  ? DULoxetine (CYMBALTA) 30 MG capsule Take by mouth.  ? esomeprazole (NEXIUM) 20 MG capsule Take 40 mg by mouth  daily at 12 noon.   ? ezetimibe (ZETIA) 10 MG tablet TAKE 1 TABLET(10 MG) BY MOUTH DAILY  ? hydrALAZINE (APRESOLINE) 50 MG tablet Take 50 mg by mouth 3 (three) times daily.  ? ipratropium (ATROVENT) 0.03 % nasal spray USE 2 SPRAYS IN EACH NOSTRIL EVERY 8 HOURS AS NEEDED  ? levocetirizine (XYZAL) 5 MG tablet Take 5 mg by mouth every evening.  ? montelukast (SINGULAIR) 10 MG tablet TAKE 1 TABLET(10 MG) BY MOUTH DAILY  ? Multiple Vitamins-Minerals (EYE VITAMINS PO) Take 2 tablets by mouth daily.   ? potassium chloride SA (KLOR-CON) 20 MEQ tablet Take 1 tablet (20 mEq total) by mouth daily.  ? pramipexole (MIRAPEX) 0.75 MG tablet TAKE 1 TABLET(0.75 MG) BY MOUTH AT BEDTIME  ? Tiotropium Bromide Monohydrate (SPIRIVA RESPIMAT) 2.5 MCG/ACT AERS Inhale 2 puffs into the lungs daily.  ? torsemide (DEMADEX) 20 MG tablet Take 2 tablets (40 mg total) by mouth daily.  ? triamcinolone (NASACORT) 55 MCG/ACT AERO nasal inhaler Place 2 sprays into the nose daily.  ? triamcinolone ointment (KENALOG) 0.5 % Apply 1 application topically 2 (two) times daily.  ? [DISCONTINUED] acetaminophen (TYLENOL) 500 MG tablet Take 1,000 mg by mouth daily as needed. (Patient not taking: Reported on 04/02/2022)  ? [DISCONTINUED] lactulose (CHRONULAC) 10 GM/15ML solution Take 15 mLs (10 g total) by mouth 2 (two) times daily as needed for mild constipation or moderate constipation. (Patient not taking: Reported on 04/02/2022)  ? ?No facility-administered medications prior to visit.  ? ? ?Review of Systems  ?Constitutional:  Negative for appetite change.  ?Respiratory:  Negative for chest tightness and shortness of breath.   ?Cardiovascular:  Positive for leg swelling. Negative for chest pain and palpitations.  ?Gastrointestinal:  Negative for abdominal pain, nausea and vomiting.  ? ?  ?  Objective  ?  ?BP 116/60 (BP Location: Left Wrist, Patient Position: Sitting, Cuff Size: Normal)   Pulse 78   Temp 98.6 ?F (37 ?C) (Oral)   Resp 16   Wt 222 lb 6.4 oz  (100.9 kg)   SpO2 98%   BMI 42.02 kg/m?  ?BP Readings from Last 3 Encounters:  ?04/02/22 116/60  ?03/18/22 (!) 145/76  ?03/16/22 140/80  ? ?Wt Readings from Last 3 Encounters:  ?04/02/22 222 lb 6.4 oz (100.9 kg)  ?03/16/22 226 lb 12.8 oz (102.9 kg)  ?03/02/22 236 lb (107 kg)  ? ?  ? ?Physical Exam ?Vitals reviewed.  ?Constitutional:   ?   General: She is not in acute distress. ?   Appearance: Normal appearance. She is well-developed. She is not diaphoretic.  ?HENT:  ?   Head: Normocephalic and atraumatic.  ?Eyes:  ?   General: No scleral icterus. ?   Conjunctiva/sclera: Conjunctivae normal.  ?Neck:  ?   Thyroid: No thyromegaly.  ?Cardiovascular:  ?   Rate and Rhythm: Normal rate and regular rhythm.  ?   Heart sounds: Normal heart sounds. No murmur heard. ?Pulmonary:  ?   Effort: Pulmonary effort is normal. No respiratory distress.  ?   Breath sounds: Normal breath sounds. No wheezing, rhonchi or rales.  ?Musculoskeletal:  ?   Cervical back: Neck supple.  ?   Right lower leg: No edema.  ?   Left lower leg: No edema.  ?Lymphadenopathy:  ?   Cervical: Cervical adenopathy (1 posterior cervical lymph node on the right side that is enlarged at about 1 cm.  It is mobile, nontender) present.  ?Skin: ?   General: Skin is warm and dry.  ?Neurological:  ?   Mental Status: She is alert and oriented to person, place, and time. Mental status is at baseline.  ?Psychiatric:     ?   Mood and Affect: Mood normal.     ?   Behavior: Behavior normal.  ?  ? ? ?No results found for any visits on 04/02/22. ? Assessment & Plan  ?  ? ?Problem List Items Addressed This Visit   ? ?  ? Respiratory  ? Multiple pulmonary nodules  ?  Noted incidentally on CT T-spine ?Patient is low risk for lung cancer, with remote smoking history 50 years ago ?We discussed risks/benefits of repeating imaging, but as she is low risk, we have opted to not repeat scan ? ?  ?  ?  ? Digestive  ? Pancreatic lesion  ?  Noted incidentally on CT abdomen pelvis in the  ER ?Appears to be a 1 cm cystic lesion ?We will repeat CT in 3 months ?We did discuss that MRI may be slightly more sensitive, but she is claustrophobic and does not tolerate MRI well ? ?  ?  ? Relevant Orders  ? CT Abdomen Pelvis Wo Contrast  ?  ? Musculoskeletal and Integument  ? Compression fracture of lumbar vertebra with routine healing  ?  Noted incidentally on CT L-spine ?Patient had had a fall and radiologist did read it as acute, but patient is not in any pain and neurosurgery when consulted thought that it might be more subacute or chronic in nature ?She was offered neurosurgery referral today, but as she has no lower extremity neuro symptoms and is not in pain,  she we will hold off on this for now ?She has had recurrent sciatica, so if this recurs, may consider neurosurgery referral at that time ? ?  ?  ?  ? Immune and Lymphatic  ? Cervical lymphadenopathy  ?  Patient reports that this 1 posterior cervical lymph node has been inflamed for many years, but it is now enlarging ?It is mobile and nontender ?We will obtain ultrasound for further characterization ? ?  ?  ? Relevant Orders  ? US Soft Tissue Head/Neck (NON-THYROID)  ?  ? Other  ? Morbid obesity ([place])  ?  Congratulated on weight loss ?She is using the diet plan from healthy weight and wellness, even though she is no longer being seen in this clinic ?She reports that they "kicked her out of the program "after missing an appointment due to being in the emergency department ?Discussed importance of healthy weight management ?Discussed diet and exercise ? ?  ?  ? Constipation - Primary  ?  Resolved ?MiraLAX cleanout was successful in resolving this problem ?She is unable to tolerate MiraLAX daily due to diarrhea ?She will use half to 1 cap of MiraLAX as needed if she goes 1 day without a bowel movement, but she is currently having 2-3 bowel movements daily ? ?  ?  ? Spinal stenosis of lumbar region without neurogenic claudication  ?  See plan above for  compression fracture ?No neurosurgery consult at this time ? ?  ?  ? ? ? ?Return if symptoms worsen or fail to improve.  ?   ? ?I, Lavon Paganini, MD, have reviewed all documentation for this visit. The docu

## 2022-04-02 ENCOUNTER — Ambulatory Visit (INDEPENDENT_AMBULATORY_CARE_PROVIDER_SITE_OTHER): Payer: Medicare Other | Admitting: Family Medicine

## 2022-04-02 ENCOUNTER — Encounter: Payer: Self-pay | Admitting: Family Medicine

## 2022-04-02 VITALS — BP 116/60 | HR 78 | Temp 98.6°F | Resp 16 | Wt 222.4 lb

## 2022-04-02 DIAGNOSIS — R590 Localized enlarged lymph nodes: Secondary | ICD-10-CM | POA: Diagnosis not present

## 2022-04-02 DIAGNOSIS — S32000D Wedge compression fracture of unspecified lumbar vertebra, subsequent encounter for fracture with routine healing: Secondary | ICD-10-CM | POA: Insufficient documentation

## 2022-04-02 DIAGNOSIS — K869 Disease of pancreas, unspecified: Secondary | ICD-10-CM | POA: Diagnosis not present

## 2022-04-02 DIAGNOSIS — K5900 Constipation, unspecified: Secondary | ICD-10-CM | POA: Diagnosis not present

## 2022-04-02 DIAGNOSIS — M48061 Spinal stenosis, lumbar region without neurogenic claudication: Secondary | ICD-10-CM

## 2022-04-02 DIAGNOSIS — R918 Other nonspecific abnormal finding of lung field: Secondary | ICD-10-CM | POA: Insufficient documentation

## 2022-04-02 DIAGNOSIS — S32050D Wedge compression fracture of fifth lumbar vertebra, subsequent encounter for fracture with routine healing: Secondary | ICD-10-CM | POA: Diagnosis not present

## 2022-04-02 NOTE — Assessment & Plan Note (Signed)
Noted incidentally on CT T-spine ?Patient is low risk for lung cancer, with remote smoking history 50 years ago ?We discussed risks/benefits of repeating imaging, but as she is low risk, we have opted to not repeat scan ?

## 2022-04-02 NOTE — Assessment & Plan Note (Signed)
Patient reports that this 1 posterior cervical lymph node has been inflamed for many years, but it is now enlarging ?It is mobile and nontender ?We will obtain ultrasound for further characterization ?

## 2022-04-02 NOTE — Assessment & Plan Note (Signed)
Noted incidentally on CT L-spine ?Patient had had a fall and radiologist did read it as acute, but patient is not in any pain and neurosurgery when consulted thought that it might be more subacute or chronic in nature ?She was offered neurosurgery referral today, but as she has no lower extremity neuro symptoms and is not in pain, she we will hold off on this for now ?She has had recurrent sciatica, so if this recurs, may consider neurosurgery referral at that time ?

## 2022-04-02 NOTE — Assessment & Plan Note (Signed)
Noted incidentally on CT abdomen pelvis in the ER ?Appears to be a 1 cm cystic lesion ?We will repeat CT in 3 months ?We did discuss that MRI may be slightly more sensitive, but she is claustrophobic and does not tolerate MRI well ?

## 2022-04-02 NOTE — Assessment & Plan Note (Signed)
Congratulated on weight loss ?She is using the diet plan from healthy weight and wellness, even though she is no longer being seen in this clinic ?She reports that they "kicked her out of the program "after missing an appointment due to being in the emergency department ?Discussed importance of healthy weight management ?Discussed diet and exercise ? ?

## 2022-04-02 NOTE — Assessment & Plan Note (Signed)
See plan above for compression fracture ?No neurosurgery consult at this time ?

## 2022-04-02 NOTE — Assessment & Plan Note (Signed)
Resolved ?MiraLAX cleanout was successful in resolving this problem ?She is unable to tolerate MiraLAX daily due to diarrhea ?She will use half to 1 cap of MiraLAX as needed if she goes 1 day without a bowel movement, but she is currently having 2-3 bowel movements daily ?

## 2022-04-06 DIAGNOSIS — M180 Bilateral primary osteoarthritis of first carpometacarpal joints: Secondary | ICD-10-CM | POA: Diagnosis not present

## 2022-04-15 ENCOUNTER — Ambulatory Visit: Payer: Medicare Other | Admitting: Cardiology

## 2022-04-19 ENCOUNTER — Other Ambulatory Visit: Payer: Self-pay | Admitting: Physician Assistant

## 2022-04-21 DIAGNOSIS — M1612 Unilateral primary osteoarthritis, left hip: Secondary | ICD-10-CM | POA: Diagnosis not present

## 2022-04-21 DIAGNOSIS — M545 Low back pain, unspecified: Secondary | ICD-10-CM | POA: Diagnosis not present

## 2022-04-27 ENCOUNTER — Ambulatory Visit: Admission: RE | Admit: 2022-04-27 | Payer: Medicare Other | Source: Ambulatory Visit

## 2022-04-30 ENCOUNTER — Encounter: Payer: Self-pay | Admitting: Cardiology

## 2022-04-30 ENCOUNTER — Ambulatory Visit (INDEPENDENT_AMBULATORY_CARE_PROVIDER_SITE_OTHER): Payer: Medicare Other | Admitting: Cardiology

## 2022-04-30 VITALS — BP 132/74 | HR 78 | Ht 61.0 in | Wt 225.2 lb

## 2022-04-30 DIAGNOSIS — I10 Essential (primary) hypertension: Secondary | ICD-10-CM

## 2022-04-30 DIAGNOSIS — R600 Localized edema: Secondary | ICD-10-CM | POA: Diagnosis not present

## 2022-04-30 MED ORDER — POTASSIUM CHLORIDE CRYS ER 20 MEQ PO TBCR: 20.0000 meq | EXTENDED_RELEASE_TABLET | Freq: Two times a day (BID) | ORAL | 3 refills | Status: DC

## 2022-04-30 MED ORDER — TORSEMIDE 40 MG PO TABS: 40.0000 mg | ORAL_TABLET | Freq: Two times a day (BID) | ORAL | 3 refills | Status: DC

## 2022-04-30 NOTE — Progress Notes (Signed)
Cardiology Office Note:    Date:  04/30/2022   ID:  Gloria Mercer, DOB 27-Jan-1941, MRN [id number]  PCP:  Gloria Crews, MD   [place]  Cardiologist:  Gloria Sable, MD  Advanced Practice Provider:  No care team member to display Electrophysiologist:  None       Referring MD: Gloria Crews, MD   No chief complaint on file.    History of Present Illness:    Gloria Mercer is a 81 y.o. female with a hx of hypertension, obesity presents for follow-up.   Started on hydralazine 50 mg 3 times daily.  Blood pressures have been adequately controlled.  Torsemide 40 mg daily was started for leg swelling.  Edema is better although still present.  Recent blood work revealed normal creatinine.  Prior notes Echocardiogram 03/07/8118 normal systolic function, impaired relaxation, EF 55 to 60%. Did not tolerate ARB or Saxenda in the past due to diarrhea.   Past Medical History:  Diagnosis Date   Allergic rhinitis    on allergy shots, sees Dr. Pryor Ochoa   Anemia    Arthritis    B12 deficiency    Back pain    Bilateral edema of lower extremity    Cataract    CHF (congestive heart failure) (HCC)    Constipation    Edema    Gallbladder problem    GERD (gastroesophageal reflux disease)    History of stomach ulcers    Hyperlipidemia    Hypertension    Joint pain    Macular degeneration    follow[facility] by Dr Garwin Brothers at [facility]   Osteoarthritis    Other fatigue    Prediabetes    Proteinuria    RLS (restless legs syndrome)    Sciatica    Shortness of breath    Shortness of breath on exertion    Sleep apnea    Stage 3 chronic kidney disease (HCC)    Swallowing difficulty    Vitamin D deficiency     Past Surgical History:  Procedure Laterality Date   CHOLECYSTECTOMY  2002   TUBAL LIGATION  1978    Current Medications: Current Meds  Medication Sig   azelastine (ASTELIN) 0.1 % nasal spray Place 2 sprays into both nostrils daily.    Cholecalciferol (VITAMIN D3) 5000 units CAPS Take 1 capsule by mouth daily.   clotrimazole-betamethasone (LOTRISONE) cream APPLY EXTERNALLY TO THE AFFECTED AREA TWICE DAILY AS NEEDED   DULoxetine (CYMBALTA) 30 MG capsule Take by mouth.   esomeprazole (NEXIUM) 20 MG capsule Take 40 mg by mouth daily at 12 noon.    ezetimibe (ZETIA) 10 MG tablet TAKE 1 TABLET(10 MG) BY MOUTH DAILY   hydrALAZINE (APRESOLINE) 50 MG tablet Take 50 mg by mouth 3 (three) times daily.   ipratropium (ATROVENT) 0.03 % nasal spray USE 2 SPRAYS IN EACH NOSTRIL EVERY 8 HOURS AS NEEDED   levocetirizine (XYZAL) 5 MG tablet Take 5 mg by mouth every evening.   montelukast (SINGULAIR) 10 MG tablet TAKE 1 TABLET(10 MG) BY MOUTH DAILY   Multiple Vitamins-Minerals (EYE VITAMINS PO) Take 2 tablets by mouth daily.    pramipexole (MIRAPEX) 0.75 MG tablet TAKE 1 TABLET(0.75 MG) BY MOUTH AT BEDTIME   Tiotropium Bromide Monohydrate (SPIRIVA RESPIMAT) 2.5 MCG/ACT AERS Inhale 2 puffs into the lungs daily.   triamcinolone (NASACORT) 55 MCG/ACT AERO nasal inhaler Place 2 sprays into the nose daily.   triamcinolone ointment (KENALOG) 0.5 % Apply 1 application topically 2 (  two) times daily.   [DISCONTINUED] furosemide (LASIX) 20 MG tablet    [DISCONTINUED] potassium chloride SA (KLOR-CON M) 20 MEQ tablet TAKE 1 TABLET BY MOUTH DAILY   [DISCONTINUED] torsemide (DEMADEX) 20 MG tablet Take 2 tablets (40 mg total) by mouth daily.     Allergies:   Erythromycin, Losartan, Metoprolol, Simvastatin, Dexlansoprazole, Lisinopril, and Prednisone   Social History   Socioeconomic History   Marital status: Divorced    Spouse name: Not on file   Number of children: 1   Years of education: Not on file   Highest education level: Bachelor's degree (e.g., BA, AB, BS)  Occupational History   Occupation: retired in 2001    Comment: social work Scientist, physiological  Tobacco Use   Smoking status: Former    Packs/day: 1.00    Years: 10.00    Pack years: 10.00     Types: Cigarettes    Start date: 11/29/1965    Quit date: 11/28/1976    Years since quitting: 45.4   Smokeless tobacco: Never  Vaping Use   Vaping Use: Never used  Substance and Sexual Activity   Alcohol use: Not Currently    Comment: rarely, at holidays   Drug use: No   Sexual activity: Not Currently  Other Topics Concern   Not on file  Social History Narrative   Not on file   Social Determinants of Health   Financial Resource Strain: Low Risk    Difficulty of Paying Living Expenses: Not hard at all  Food Insecurity: No Food Insecurity   Worried About Charity fundraiser in the Last Year: Never true   [place] in the Last Year: Never true  Transportation Needs: No Transportation Needs   Lack of Transportation (Medical): No   Lack of Transportation (Non-Medical): No  Physical Activity: Inactive   Days of Exercise per Week: 0 days   Minutes of Exercise per Session: 0 min  Stress: No Stress Concern Present   Feeling of Stress : Not at all  Social Connections: Unknown   Frequency of Communication with Friends and Family: More than three times a week   Frequency of Social Gatherings with Friends and Family: Once a week   Attends Religious Services: Never   Marine scientist or Organizations: No   Attends Music therapist: Never   Marital Status: Not on file     Family History: The patient's family history includes Diabetes in her father; Heart disease (age of onset: 84) in her father; Hyperlipidemia in her mother; Hypertension in her father and mother; Myelodysplastic syndrome in her sister; Prostate cancer in her paternal grandfather; Seizures in her mother. There is no history of Breast cancer or Cervical cancer.  ROS:   Please see the history of present illness.     All other systems reviewed and are negative.  EKGs/Labs/Other Studies Reviewed:    The following studies were reviewed today:   EKG:  EKG not ordered today.   Recent  Labs: 03/02/2022: TSH 3.920 03/18/2022: ALT 14; BUN 18; Creatinine, Ser 0.96; Hemoglobin 12.2; Magnesium 1.9; Platelets 308; Potassium 3.3; Sodium 133  Recent Lipid Panel    Component Value Date/Time   CHOL 198 12/25/2021 1059   TRIG 159 (H) 12/25/2021 1059   HDL 62 12/25/2021 1059   CHOLHDL 3.2 12/25/2021 1059   LDLCALC 108 (H) 12/25/2021 1059     Risk Assessment/Calculations:      Physical Exam:    VS:  BP 132/74  Pulse 78   Ht '5\' 1"'$  (1.549 m)   Wt 225 lb 3.2 oz (102.2 kg)   SpO2 95%   BMI 42.55 kg/m     Wt Readings from Last 3 Encounters:  04/30/22 225 lb 3.2 oz (102.2 kg)  04/02/22 222 lb 6.4 oz (100.9 kg)  03/16/22 226 lb 12.8 oz (102.9 kg)     GEN:  Well nourished, well developed in no acute distress HEENT: Normal NECK: Unable to assess JVD due to body habitus CARDIAC: RRR, no murmurs, rubs, gallops RESPIRATORY: Decreased breath sounds at bases ABDOMEN: Soft, non-tender, non-distended MUSCULOSKELETAL:  2+ edema; No deformity  SKIN: Warm and dry NEUROLOGIC:  Alert and oriented x 3 PSYCHIATRIC:  Normal affect   ASSESSMENT:    1. Leg edema   2. Primary hypertension    PLAN:    In order of problems listed above:  Bilateral lower extremity edema, echo with normal systolic function, impaired relaxation.  Edema is likely from morbid obesity.  Increase torsemide to 40 mg twice daily.  Recent BMP with normal creatinine.  Increase KCl to 20 mEq twice daily.  Check BMP in 1 week. Hypertension, BP controlled.  Continue hydralazine 50 mg 3 times daily.  Follow-up in 6-8 weeks     Medication Adjustments/Labs and Tests Ordered: Current medicines are reviewed at length with the patient today.  Concerns regarding medicines are outlined above.  Orders Placed This Encounter  Procedures   Basic metabolic panel    Meds ordered this encounter  Medications   torsemide 40 MG TABS    Sig: Take 40 mg by mouth 2 (two) times daily.    Dispense:  60 tablet    Refill:   3   potassium chloride SA (KLOR-CON M) 20 MEQ tablet    Sig: Take 1 tablet (20 mEq total) by mouth 2 (two) times daily.    Dispense:  60 tablet    Refill:  3      Patient Instructions  Medication Instructions:   Your physician has recommended you make the following change in your medication:    INCREASE your Torsemide (DEMADEX) 40 MG twice a day.  2.    INCREASE your Potassium Chloride to 20 MEQ twice a day.   *If you need a refill on your cardiac medications before your next appointment, please call your pharmacy*   Lab Work:  Your physician recommends that you return for lab work (BMP) in: 1 week  - Please go to the [facility]. You will check in at the front desk to the right as you walk into the atrium. Valet Parking is offered if needed. - No appointment needed. You may go any day between 7 am and 6 pm.    Follow-Up: At [facility], you and your health needs are our priority.  As part of our continuing mission to provide you with exceptional heart care, we have created designated Provider Care Teams.  These Care Teams include your primary Cardiologist (physician) and Advanced Practice Providers (APPs -  Physician Assistants and Nurse Practitioners) who all work together to provide you with the care you need, when you need it.  We recommend signing up for the patient portal called "MyChart".  Sign up information is provided on this After Visit Summary.  MyChart is used to connect with patients for Virtual Visits (Telemedicine).  Patients are able to view lab/test results, encounter notes, upcoming appointments, etc.  Non-urgent messages can be sent to your provider as well.  To learn more about what you can do with MyChart, go to [email or web address].    Your next appointment:   6-8 week(s)  The format for your next appointment:   In Person  Provider:   You may see Gloria Sable, MD or one of the following Advanced Practice Providers on your designated  Care Team:   Murray Hodgkins, NP Christell Faith, PA-C Cadence Kathlen Mody, [place]    Other Instructions   Important Information About Sugar         Signed, Gloria Sable, MD  04/30/2022 5:33 PM    [place]

## 2022-04-30 NOTE — Patient Instructions (Signed)
Medication Instructions:   Your physician has recommended you make the following change in your medication:    INCREASE your Torsemide (DEMADEX) 40 MG twice a day.  2.    INCREASE your Potassium Chloride to 20 MEQ twice a day.   *If you need a refill on your cardiac medications before your next appointment, please call your pharmacy*   Lab Work:  Your physician recommends that you return for lab work (BMP) in: 1 week  - Please go to the [facility]. You will check in at the front desk to the right as you walk into the atrium. Valet Parking is offered if needed. - No appointment needed. You may go any day between 7 am and 6 pm.    Follow-Up: At [facility], you and your health needs are our priority.  As part of our continuing mission to provide you with exceptional heart care, we have created designated Provider Care Teams.  These Care Teams include your primary Cardiologist (physician) and Advanced Practice Providers (APPs -  Physician Assistants and Nurse Practitioners) who all work together to provide you with the care you need, when you need it.  We recommend signing up for the patient portal called "MyChart".  Sign up information is provided on this After Visit Summary.  MyChart is used to connect with patients for Virtual Visits (Telemedicine).  Patients are able to view lab/test results, encounter notes, upcoming appointments, etc.  Non-urgent messages can be sent to your provider as well.   To learn more about what you can do with MyChart, go to [email or web address].    Your next appointment:   6-8 week(s)  The format for your next appointment:   In Person  Provider:   You may see Kate Sable, MD or one of the following Advanced Practice Providers on your designated Care Team:   Murray Hodgkins, NP Christell Faith, PA-C Cadence Kathlen Mody, [place]    Other Instructions   Important Information About Sugar

## 2022-05-03 ENCOUNTER — Telehealth: Payer: Self-pay | Admitting: Cardiology

## 2022-05-03 NOTE — Telephone Encounter (Signed)
Scheduled fu.  Patient aware of labs on Friday

## 2022-05-03 NOTE — Telephone Encounter (Signed)
Per checkout 04-30-22 : 1 week labs  6-8 week fu

## 2022-05-07 ENCOUNTER — Other Ambulatory Visit
Admission: RE | Admit: 2022-05-07 | Discharge: 2022-05-07 | Disposition: A | Discharge status: Home / Self Care | Payer: Medicare Other | Attending: Cardiology | Admitting: Cardiology

## 2022-05-07 DIAGNOSIS — R600 Localized edema: Secondary | ICD-10-CM | POA: Diagnosis not present

## 2022-05-07 LAB — BASIC METABOLIC PANEL
Anion gap: 7 (ref 5–15)
BUN: 25 mg/dL — ABNORMAL HIGH (ref 8–23)
CO2: 26 mmol/L (ref 22–32)
Calcium: 9.4 mg/dL (ref 8.9–10.3)
Chloride: 106 mmol/L (ref 98–111)
Creatinine, Ser: 0.97 mg/dL (ref 0.44–1.00)
GFR, Estimated: 59 mL/min — ABNORMAL LOW (ref >60)
Glucose, Bld: 120 mg/dL — ABNORMAL HIGH (ref 70–99)
Potassium: 3.4 mmol/L — ABNORMAL LOW (ref 3.5–5.1)
Sodium: 139 mmol/L (ref 135–145)

## 2022-05-10 DIAGNOSIS — M159 Polyosteoarthritis, unspecified: Secondary | ICD-10-CM | POA: Diagnosis not present

## 2022-05-11 ENCOUNTER — Telehealth: Payer: Self-pay

## 2022-05-11 DIAGNOSIS — I509 Heart failure, unspecified: Secondary | ICD-10-CM

## 2022-05-11 NOTE — Progress Notes (Signed)
Opened in error

## 2022-05-11 NOTE — Telephone Encounter (Signed)
-----   Message from Kate Sable, MD sent at 05/07/2022  5:23 PM EDT ----- Creatinine/kidney function stable.  Potassium slightly improved from prior, still low.  Continue medications and potassium supplements as prescribed.  Repeat BMP in 2 weeks.

## 2022-05-11 NOTE — Telephone Encounter (Signed)
Called patient and reviewed the result note below. Patient verbalized understanding and agreed with plan. Patient confirmed that she will go get a lab draw in 2 weeks.

## 2022-05-14 DIAGNOSIS — M1612 Unilateral primary osteoarthritis, left hip: Secondary | ICD-10-CM | POA: Diagnosis not present

## 2022-05-17 ENCOUNTER — Ambulatory Visit
Admission: RE | Admit: 2022-05-17 | Discharge: 2022-05-17 | Disposition: A | Discharge status: Home / Self Care | Payer: Medicare Other | Source: Ambulatory Visit | Attending: Family Medicine | Admitting: Family Medicine

## 2022-05-17 DIAGNOSIS — R590 Localized enlarged lymph nodes: Secondary | ICD-10-CM | POA: Insufficient documentation

## 2022-05-21 ENCOUNTER — Other Ambulatory Visit: Payer: Self-pay

## 2022-05-21 MED ORDER — HYDRALAZINE HCL 50 MG PO TABS: 50.0000 mg | ORAL_TABLET | Freq: Three times a day (TID) | ORAL | 1 refills | Status: DC

## 2022-05-24 ENCOUNTER — Other Ambulatory Visit: Payer: Self-pay | Admitting: *Deleted

## 2022-05-24 MED ORDER — HYDRALAZINE HCL 50 MG PO TABS: 50.0000 mg | ORAL_TABLET | Freq: Three times a day (TID) | ORAL | 0 refills | Status: DC

## 2022-05-25 ENCOUNTER — Telehealth: Payer: Self-pay | Admitting: Cardiology

## 2022-05-25 NOTE — Telephone Encounter (Signed)
Pt states that she was suppose to go and have lab work done today per the conversation that she and nurse has 2 weeks ago. Pt wanted to make sure aware that she will not be abler to go today due to her not feeling well. Please advise

## 2022-05-31 ENCOUNTER — Telehealth: Payer: Self-pay | Admitting: Cardiology

## 2022-05-31 MED ORDER — POTASSIUM CHLORIDE CRYS ER 20 MEQ PO TBCR
20.0000 meq | EXTENDED_RELEASE_TABLET | Freq: Two times a day (BID) | ORAL | 2 refills | Status: DC
Start: 2022-05-31 — End: 2022-07-05

## 2022-05-31 MED ORDER — TORSEMIDE 40 MG PO TABS: 40.0000 mg | ORAL_TABLET | Freq: Two times a day (BID) | ORAL | 2 refills | Status: DC

## 2022-05-31 NOTE — Telephone Encounter (Signed)
*  STAT* If patient is at the pharmacy, call can be transferred to refill team.   1. Which medications need to be refilled? (please list name of each medication and dose if known) new prescriptions for Potassium 20 meq 2 times a day  and Torsemide 40 mg 2 times a day  2. Which pharmacy/location (including street and city if local pharmacy) is medication to be sent to? [facility] [place], [place]  3. Do they need a 30 day or 90 day supply? 90 days and refills

## 2022-05-31 NOTE — Telephone Encounter (Signed)
Requested Prescriptions   Signed Prescriptions Disp Refills   potassium chloride SA (KLOR-CON M) 20 MEQ tablet 180 tablet 2    Sig: Take 1 tablet (20 mEq total) by mouth 2 (two) times daily.    Authorizing Provider: Kate Sable    Ordering User: Draycen Leichter C   Torsemide 40 MG TABS 180 tablet 2    Sig: Take 40 mg by mouth 2 (two) times daily.    Authorizing Provider: Kate Sable    Ordering User: Britt Bottom

## 2022-06-15 ENCOUNTER — Other Ambulatory Visit
Admission: RE | Admit: 2022-06-15 | Discharge: 2022-06-15 | Disposition: A | Discharge status: Home / Self Care | Payer: Medicare Other | Attending: Cardiology | Admitting: Cardiology

## 2022-06-15 DIAGNOSIS — I509 Heart failure, unspecified: Secondary | ICD-10-CM | POA: Insufficient documentation

## 2022-06-15 LAB — BASIC METABOLIC PANEL
Anion gap: 8 (ref 5–15)
BUN: 19 mg/dL (ref 8–23)
CO2: 27 mmol/L (ref 22–32)
Calcium: 9.1 mg/dL (ref 8.9–10.3)
Chloride: 105 mmol/L (ref 98–111)
Creatinine, Ser: 1.18 mg/dL — ABNORMAL HIGH (ref 0.44–1.00)
GFR, Estimated: 46 mL/min — ABNORMAL LOW (ref >60)
Glucose, Bld: 117 mg/dL — ABNORMAL HIGH (ref 70–99)
Potassium: 4 mmol/L (ref 3.5–5.1)
Sodium: 140 mmol/L (ref 135–145)

## 2022-06-23 ENCOUNTER — Telehealth: Payer: Self-pay

## 2022-06-23 NOTE — Telephone Encounter (Signed)
Copied from [place]. Topic: General - Inquiry >> Jun 23, 2022 11:08 AM Gloria Mercer wrote: Reason for CRM: Pt stated she needs to come in for labs before her appointment. Pt stated she was told to just come in and do blood work.  No orders.  Pt mentioned her upcoming appointment should be with DO Shela Commons, not Ria Comment, and she would like for this to be changed back to Dr. Ky Barban.  Please advise.

## 2022-06-24 ENCOUNTER — Ambulatory Visit: Payer: Medicare Other | Admitting: Family Medicine

## 2022-06-24 NOTE — Telephone Encounter (Signed)
noted 

## 2022-06-28 ENCOUNTER — Ambulatory Visit: Payer: Medicare Other | Admitting: Physician Assistant

## 2022-07-02 ENCOUNTER — Encounter: Payer: Self-pay | Admitting: Cardiology

## 2022-07-02 ENCOUNTER — Ambulatory Visit (INDEPENDENT_AMBULATORY_CARE_PROVIDER_SITE_OTHER): Payer: Medicare Other | Admitting: Cardiology

## 2022-07-02 VITALS — BP 158/65 | HR 68 | Ht 61.0 in | Wt 223.0 lb

## 2022-07-02 DIAGNOSIS — I10 Essential (primary) hypertension: Secondary | ICD-10-CM

## 2022-07-02 DIAGNOSIS — R600 Localized edema: Secondary | ICD-10-CM

## 2022-07-02 MED ORDER — METOLAZONE 5 MG PO TABS: 5.0000 mg | ORAL_TABLET | Freq: Every day | ORAL | 3 refills | Status: DC

## 2022-07-02 NOTE — Patient Instructions (Signed)
Medication Instructions:  Your physician has recommended you make the following change in your medication:    START Metolazone 5 MG once a day. Take this 30 Mins prior to your Torsemide.  *If you need a refill on your cardiac medications before your next appointment, please call your pharmacy*   Lab Work:  Your physician recommends that you return for lab work (BMP) in: On Monday 07/05/22  - Please go to the [facility]. You will check in at the front desk to the right as you walk into the atrium. Valet Parking is offered if needed. - No appointment needed. You may go any day between 7 am and 6 pm.  2.   Your physician recommends that you return for lab work (BMP) in: 2 WEEKS (07/19/22)  - Please go to the [facility]. You will check in at the front desk to the right as you walk into the atrium. Valet Parking is offered if needed. - No appointment needed. You may go any day between 7 am and 6 pm.    Follow-Up: At [facility], you and your health needs are our priority.  As part of our continuing mission to provide you with exceptional heart care, we have created designated Provider Care Teams.  These Care Teams include your primary Cardiologist (physician) and Advanced Practice Providers (APPs -  Physician Assistants and Nurse Practitioners) who all work together to provide you with the care you need, when you need it.  We recommend signing up for the patient portal called "MyChart".  Sign up information is provided on this After Visit Summary.  MyChart is used to connect with patients for Virtual Visits (Telemedicine).  Patients are able to view lab/test results, encounter notes, upcoming appointments, etc.  Non-urgent messages can be sent to your provider as well.   To learn more about what you can do with MyChart, go to [email or web address].    Your next appointment:   4-6 week(s)  The format for your next appointment:   In Person  Provider:   You may see Kate Sable, MD or one of the following Advanced Practice Providers on your designated Care Team:   Murray Hodgkins, NP Christell Faith, PA-C Cadence Kathlen Mody, [place]    Other Instructions   Important Information About Sugar

## 2022-07-02 NOTE — Progress Notes (Signed)
Cardiology Office Note:    Date:  07/02/2022   ID:  [name], DOB September 21, 1941, MRN [id number]  PCP:  Virginia Crews, MD   [place]  Cardiologist:  Kate Sable, MD  Advanced Practice Provider:  No care team member to display Electrophysiologist:  None       Referring MD: Virginia Crews, MD   Chief Complaint  Patient presents with   Follow-up    No new cardiac concerns    History of Present Illness:    Gloria Mercer is a 81 y.o. female with a hx of hypertension, obesity presents for follow-up.   Being seen for leg edema, torsemide increased to 40 mg twice daily after last visit.  She takes medications as prescribed, has not noticed any significant improvement, her legs are still swollen.   Prior notes Echocardiogram 0/05/6225 normal systolic function, impaired relaxation, EF 55 to 60%. Not tolerant to several BP meds including losartan, metoprolol, lisinopril.   Past Medical History:  Diagnosis Date   Allergic rhinitis    on allergy shots, sees Dr. Pryor Ochoa   Anemia    Arthritis    B12 deficiency    Back pain    Bilateral edema of lower extremity    Cataract    CHF (congestive heart failure) (HCC)    Constipation    Edema    Gallbladder problem    GERD (gastroesophageal reflux disease)    History of stomach ulcers    Hyperlipidemia    Hypertension    Joint pain    Macular degeneration    followed by Dr Garwin Brothers at [facility]   Osteoarthritis    Other fatigue    Prediabetes    Proteinuria    RLS (restless legs syndrome)    Sciatica    Shortness of breath    Shortness of breath on exertion    Sleep apnea    Stage 3 chronic kidney disease (HCC)    Swallowing difficulty    Vitamin D deficiency     Past Surgical History:  Procedure Laterality Date   CHOLECYSTECTOMY  2002   TUBAL LIGATION  1978    Current Medications: Current Meds  Medication Sig   azelastine (ASTELIN) 0.1 % nasal spray Place 2 sprays  into both nostrils daily.   Cholecalciferol (VITAMIN D3) 5000 units CAPS Take 1 capsule by mouth daily.   clotrimazole-betamethasone (LOTRISONE) cream APPLY EXTERNALLY TO THE AFFECTED AREA TWICE DAILY AS NEEDED   Cyanocobalamin 500 MCG SUBL Place under the tongue daily.   esomeprazole (NEXIUM) 20 MG capsule Take 40 mg by mouth daily at 12 noon.    ezetimibe (ZETIA) 10 MG tablet TAKE 1 TABLET(10 MG) BY MOUTH DAILY   hydrALAZINE (APRESOLINE) 50 MG tablet Take 1 tablet (50 mg total) by mouth 3 (three) times daily.   ipratropium (ATROVENT) 0.03 % nasal spray USE 2 SPRAYS IN EACH NOSTRIL EVERY 8 HOURS AS NEEDED   levocetirizine (XYZAL) 5 MG tablet Take 5 mg by mouth every evening.   metolazone (ZAROXOLYN) 5 MG tablet Take 1 tablet (5 mg total) by mouth daily. Take 30 mins prior to your Torsemide.   montelukast (SINGULAIR) 10 MG tablet TAKE 1 TABLET(10 MG) BY MOUTH DAILY   Multiple Vitamins-Minerals (EYE VITAMINS PO) Take 2 tablets by mouth daily.    potassium chloride SA (KLOR-CON M) 20 MEQ tablet Take 1 tablet (20 mEq total) by mouth 2 (two) times daily.   pramipexole (MIRAPEX) 0.75 MG tablet TAKE  1 TABLET(0.75 MG) BY MOUTH AT BEDTIME   Tiotropium Bromide Monohydrate (SPIRIVA RESPIMAT) 2.5 MCG/ACT AERS Inhale 2 puffs into the lungs daily.   Torsemide 40 MG TABS Take 40 mg by mouth 2 (two) times daily.   triamcinolone (NASACORT) 55 MCG/ACT AERO nasal inhaler Place 2 sprays into the nose daily.   triamcinolone ointment (KENALOG) 0.5 % Apply 1 Application topically 2 (two) times daily as needed.     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, physiological  Tobacco Use   Smoking status: Former    Packs/day: 1.00    Years: 10.00    Total pack years: 10.00    Types: Cigarettes    Start date: 11/29/1965    Quit date: 11/28/1976    Years since quitting: 45.6   Smokeless tobacco: Never  Vaping Use   Vaping Use: Never used  Substance and Sexual Activity   Alcohol use: Not Currently    Comment: rarely, at holidays   Drug use: No   Sexual activity: Not Currently  Other Topics Concern   Not on file  Social History Narrative   Not on file   Social Determinants of Health   Financial Resource Strain: Low Risk  (02/24/2022)   Overall Financial Resource Strain (CARDIA)    Difficulty of Paying Living Expenses: Not hard at all  Food Insecurity: No Food Insecurity (02/24/2022)   Hunger Vital Sign    Worried About Running Out of Food in the Last Year: Never true    [place] in the Last Year: Never true  Transportation Needs: No Transportation Needs (02/24/2022)   PRAPARE - Hydrologist (Medical): No    Lack of Transportation (Non-Medical): No  Physical Activity: Inactive (02/24/2022)   Exercise Vital Sign    Days of Exercise per Week: 0 days    Minutes of Exercise per Session: 0 min  Stress: No Stress Concern Present (02/24/2022)   [place]    Feeling of Stress : Not at all  Social Connections: Unknown (02/24/2022)   Social Connection and Isolation Panel [NHANES]    Frequency of Communication with Friends and Family: More than three times a week    Frequency of Social Gatherings with Friends and Family: Once a week    Attends Religious Services: Never    Marine scientist or Organizations: No    Attends Music therapist: Never    Marital Status: Not on file     Family History: The patient's family history includes Diabetes in her father; Heart disease (age of onset: 52) in her father; Hyperlipidemia in her  mother; Hypertension in her father and mother; Myelodysplastic syndrome in her sister; Prostate cancer in her paternal grandfather; Seizures in her mother. There is no history of Breast cancer or Cervical cancer.  ROS:   Please see the history of present illness.     All other systems reviewed and are negative.  EKGs/Labs/Other Studies Reviewed:    The following studies were reviewed today:   EKG:  EKG not ordered today.  Recent Labs: 03/02/2022: TSH 3.920 03/18/2022: ALT 14; Hemoglobin 12.2; Magnesium 1.9; Platelets 308 06/15/2022: BUN 19; Creatinine, Ser 1.18; Potassium 4.0; Sodium 140  Recent Lipid Panel    Component Value Date/Time   CHOL 198 12/25/2021 1059   TRIG 159 (H) 12/25/2021 1059   HDL 62 12/25/2021 1059   CHOLHDL 3.2 12/25/2021 1059   LDLCALC 108 (H) 12/25/2021 1059     Risk Assessment/Calculations:      Physical Exam:    VS:  BP (!) 158/65 (BP Location: Left Arm, Patient Position: Sitting, Cuff Size: Normal) Comment (BP Location): lower arm below elbow  Pulse 68   Ht '5\' 1"'$  (1.549 m)   Wt 223 lb (101.2 kg)   SpO2 98%   BMI 42.14 kg/m     Wt Readings from Last 3 Encounters:  07/02/22 223 lb (101.2 kg)  04/30/22 225 lb 3.2 oz (102.2 kg)  04/02/22 222 lb 6.4 oz (100.9 kg)     GEN:  Well nourished, well developed in no acute distress HEENT: Normal NECK: Unable to assess JVD due to body habitus CARDIAC: RRR, no murmurs, rubs, gallops RESPIRATORY: Decreased breath sounds at bases ABDOMEN: Soft, non-tender, non-distended MUSCULOSKELETAL:  2+ edema; No deformity  SKIN: Warm and dry NEUROLOGIC:  Alert and oriented x 3 PSYCHIATRIC:  Normal affect   ASSESSMENT:    1. Leg edema   2. Primary hypertension   3. Morbid obesity ([place])    PLAN:    In order of problems listed above:  Bilateral lower extremity edema, echo with normal systolic function, impaired relaxation.  Start metolazone 5 mg daily, continue torsemide 40 mg twice daily.  Check BMP in 3  days and then 2 weeks.  Continue KCl as prescribed. Hypertension, BP elevated today, usually controlled.  Continue hydralazine 50 mg 3 times daily. Morbid obesity, low-calorie diet, recommended  Follow-up in 6-8 weeks   Medication Adjustments/Labs and Tests Ordered: Current medicines are reviewed at length with the patient today.  Concerns regarding medicines are outlined above.  Orders Placed This Encounter  Procedures   Basic metabolic panel   Basic metabolic panel    Meds ordered this encounter  Medications   metolazone (ZAROXOLYN) 5 MG tablet    Sig: Take 1 tablet (5 mg total) by mouth daily. Take 30 mins prior to your Torsemide.    Dispense:  30 tablet    Refill:  3      Patient Instructions  Medication Instructions:  Your physician has recommended you make the following change in your medication:    START Metolazone 5 MG once a day. Take this 30 Mins prior to your Torsemide.  *If you need a refill on your cardiac medications before your next appointment, please call your pharmacy*   Lab Work:  Your physician recommends that you return for lab work (BMP) in: On Monday 07/05/22  - Please go to the [facility] You will check in at the front desk to the right as you walk into the atrium. Valet Parking is offered if needed. - No appointment needed. You may go any day between 7 am and 6 pm.  2.   Your physician recommends that you return for lab work (BMP) in: 2 WEEKS (07/19/22)  - Please go to the [facility]. You will check in at the front desk to the right as you walk into the atrium. Valet Parking is offered if needed. - No appointment needed. You may go any day between 7 am and  6 pm.    Follow-Up: At [facility], you and your health needs are our priority.  As part of our continuing mission to provide you with exceptional heart care, we have created designated Provider Care Teams.  These Care Teams include your primary Cardiologist (physician) and  Advanced Practice Providers (APPs -  Physician Assistants and Nurse Practitioners) who all work together to provide you with the care you need, when you need it.  We recommend signing up for the patient portal called "MyChart".  Sign up information is provided on this After Visit Summary.  MyChart is used to connect with patients for Virtual Visits (Telemedicine).  Patients are able to view lab/test results, encounter notes, upcoming appointments, etc.  Non-urgent messages can be sent to your provider as well.   To learn more about what you can do with MyChart, go to [email or web address].    Your next appointment:   4-6 week(s)  The format for your next appointment:   In Person  Provider:   You may see Kate Sable, MD or one of the following Advanced Practice Providers on your designated Care Team:   Murray Hodgkins, NP Christell Faith, PA-C Cadence Kathlen Mody, [place]    Other Instructions   Important Information About Sugar          Signed, Kate Sable, MD  07/02/2022 4:21 PM    [place]

## 2022-07-05 ENCOUNTER — Other Ambulatory Visit
Admission: RE | Admit: 2022-07-05 | Discharge: 2022-07-05 | Disposition: A | Discharge status: Home / Self Care | Payer: Medicare Other | Attending: Cardiology | Admitting: Cardiology

## 2022-07-05 ENCOUNTER — Telehealth: Payer: Self-pay | Admitting: Cardiology

## 2022-07-05 DIAGNOSIS — R600 Localized edema: Secondary | ICD-10-CM | POA: Diagnosis not present

## 2022-07-05 DIAGNOSIS — I10 Essential (primary) hypertension: Secondary | ICD-10-CM | POA: Insufficient documentation

## 2022-07-05 LAB — BASIC METABOLIC PANEL
Anion gap: 15 (ref 5–15)
BUN: 42 mg/dL — ABNORMAL HIGH (ref 8–23)
CO2: 29 mmol/L (ref 22–32)
Calcium: 9.3 mg/dL (ref 8.9–10.3)
Chloride: 92 mmol/L — ABNORMAL LOW (ref 98–111)
Creatinine, Ser: 1.73 mg/dL — ABNORMAL HIGH (ref 0.44–1.00)
GFR, Estimated: 29 mL/min — ABNORMAL LOW (ref >60)
Glucose, Bld: 135 mg/dL — ABNORMAL HIGH (ref 70–99)
Potassium: 2.6 mmol/L — CL (ref 3.5–5.1)
Sodium: 136 mmol/L (ref 135–145)

## 2022-07-05 MED ORDER — METOLAZONE 5 MG PO TABS: 2.5000 mg | ORAL_TABLET | Freq: Every day | ORAL | 3 refills | Status: DC

## 2022-07-05 MED ORDER — POTASSIUM CHLORIDE CRYS ER 20 MEQ PO TBCR: EXTENDED_RELEASE_TABLET | ORAL | 2 refills | Status: DC

## 2022-07-05 NOTE — Telephone Encounter (Signed)
[facility] is calling to report critical results on patient.

## 2022-07-05 NOTE — Telephone Encounter (Signed)
Call received from the lab at [facility]. Calling with Critical K+ of 2.6.  Reviewed lab work with Dr. Garen Lah. K+ 2.6 BUN- 42 Creatinine 1.73  The patient was started on Metolazone 5 mg once daily on 07/02/22. She is also on torsemide 40 mg BID & Potassium 20 meq BID.  Per Dr. Garen Lah: 1) DECREASE metolazone to 2.5 mg once daily 2) INCREASE potassium to 40 meq BID 3) repeat a BMP in 1 week   I have called and spoken with the patient regarding her lab results and MD recommendations.  She voices understanding of all of the above and is agreeable.

## 2022-07-07 ENCOUNTER — Ambulatory Visit: Admission: RE | Admit: 2022-07-07 | Payer: Medicare Other | Source: Ambulatory Visit

## 2022-07-12 ENCOUNTER — Telehealth: Payer: Self-pay | Admitting: Cardiology

## 2022-07-12 ENCOUNTER — Other Ambulatory Visit
Admission: RE | Admit: 2022-07-12 | Discharge: 2022-07-12 | Disposition: A | Discharge status: Home / Self Care | Payer: Medicare Other | Attending: Cardiology | Admitting: Cardiology

## 2022-07-12 DIAGNOSIS — R600 Localized edema: Secondary | ICD-10-CM

## 2022-07-12 LAB — BASIC METABOLIC PANEL
Anion gap: 16 — ABNORMAL HIGH (ref 5–15)
BUN: 44 mg/dL — ABNORMAL HIGH (ref 8–23)
CO2: 28 mmol/L (ref 22–32)
Calcium: 9.1 mg/dL (ref 8.9–10.3)
Chloride: 87 mmol/L — ABNORMAL LOW (ref 98–111)
Creatinine, Ser: 1.33 mg/dL — ABNORMAL HIGH (ref 0.44–1.00)
GFR, Estimated: 40 mL/min — ABNORMAL LOW (ref >60)
Glucose, Bld: 191 mg/dL — ABNORMAL HIGH (ref 70–99)
Potassium: 2.4 mmol/L — CL (ref 3.5–5.1)
Sodium: 131 mmol/L — ABNORMAL LOW (ref 135–145)

## 2022-07-12 NOTE — Telephone Encounter (Signed)
Duplicate phone encounter see prior encounter for today.

## 2022-07-12 NOTE — Telephone Encounter (Signed)
Pt called stating she has been sick for a few days and will not be going to hospital for blood work. She states the torsemide and potassium was making her sick and she stopped taking it and will go back to the old dosage.

## 2022-07-12 NOTE — Telephone Encounter (Signed)
Patient had a critical potassium of 2.6 one week ago. Called patient and left a detailed VM pertaining to the importance of getting the lab drawn, and requested a call back to discuss this and review what she is actually now taking.

## 2022-07-12 NOTE — Telephone Encounter (Signed)
Called patient and she stated that she started having constipation so she stopped taking her Metolazone altogether and reduced her Torsmide back to 40 MG twice a day. Upon review of chart, I informed her that per Dr. Thereasa Solo previous instructions documented below, patient was not to increase her Torsemide and this is very dangerous with a critical potassium.   Patient states that she has now been taking Torsemide 40 MG BID and Potassium 20 MEQ BID.  Patient stated that she her constipation resolved last night and she is now having diarrhea and cannot leave her bathroom. She also cancelled another doctor appointment today because of this.  I strongly advised that she needs to get the BMP lab draw ASAP as her potassium was critical last week and with the increase of Torsemide she took, decrease in potassium dose, along with her diarrhea, she is at very high risk for a critical result.  Patient stated that she understood and would get the lab drawn as soon as she could.

## 2022-07-12 NOTE — Telephone Encounter (Signed)
[facility] lab following up regarding critical labs. Call disconnected while attempting to get nurse on the line. Also unable to confirm call back #.

## 2022-07-12 NOTE — Addendum Note (Signed)
Addended by: Kavin Leech on: 07/12/2022 03:14 PM   Modules accepted: Orders

## 2022-07-12 NOTE — Telephone Encounter (Signed)
Follow Up:      Patient is returning Coleen's call from today, concerning her lab results.

## 2022-07-12 NOTE — Addendum Note (Signed)
Addended by: Kavin Leech on: 07/12/2022 04:39 PM   Modules accepted: Orders

## 2022-07-12 NOTE — Telephone Encounter (Signed)
Gloria Mercer called to report critial potassium level of 2.4  Spoke with Dr. Garen Lah and he recommended that the patient take Torsemide 40 MG BID, and Potassium 40 MEQ BID for 5 days then get another BMP.    Called patient and informed her of the recommendations above. Patient wrote down instructions and repeated them back, agreed to get lab draw this Friday 818.

## 2022-07-13 ENCOUNTER — Ambulatory Visit: Admission: RE | Admit: 2022-07-13 | Payer: Medicare Other | Source: Ambulatory Visit

## 2022-07-16 ENCOUNTER — Ambulatory Visit: Payer: Self-pay | Admitting: *Deleted

## 2022-07-16 ENCOUNTER — Other Ambulatory Visit
Admission: RE | Admit: 2022-07-16 | Discharge: 2022-07-16 | Disposition: A | Discharge status: Home / Self Care | Payer: Medicare Other | Attending: Cardiology | Admitting: Cardiology

## 2022-07-16 ENCOUNTER — Telehealth: Payer: Self-pay

## 2022-07-16 DIAGNOSIS — R600 Localized edema: Secondary | ICD-10-CM | POA: Diagnosis not present

## 2022-07-16 DIAGNOSIS — M7731 Calcaneal spur, right foot: Secondary | ICD-10-CM | POA: Diagnosis not present

## 2022-07-16 DIAGNOSIS — S82831A Other fracture of upper and lower end of right fibula, initial encounter for closed fracture: Secondary | ICD-10-CM | POA: Diagnosis not present

## 2022-07-16 DIAGNOSIS — S99911A Unspecified injury of right ankle, initial encounter: Secondary | ICD-10-CM | POA: Diagnosis not present

## 2022-07-16 DIAGNOSIS — M7989 Other specified soft tissue disorders: Secondary | ICD-10-CM | POA: Diagnosis not present

## 2022-07-16 DIAGNOSIS — N183 Chronic kidney disease, stage 3 unspecified: Secondary | ICD-10-CM | POA: Diagnosis not present

## 2022-07-16 LAB — BASIC METABOLIC PANEL
Anion gap: 9 (ref 5–15)
BUN: 38 mg/dL — ABNORMAL HIGH (ref 8–23)
CO2: 28 mmol/L (ref 22–32)
Calcium: 9.3 mg/dL (ref 8.9–10.3)
Chloride: 104 mmol/L (ref 98–111)
Creatinine, Ser: 1.11 mg/dL — ABNORMAL HIGH (ref 0.44–1.00)
GFR, Estimated: 50 mL/min — ABNORMAL LOW (ref >60)
Glucose, Bld: 153 mg/dL — ABNORMAL HIGH (ref 70–99)
Potassium: 4 mmol/L (ref 3.5–5.1)
Sodium: 141 mmol/L (ref 135–145)

## 2022-07-16 NOTE — Telephone Encounter (Signed)
Opened in error

## 2022-07-16 NOTE — Telephone Encounter (Signed)
Message from Gloria Mercer sent at 07/16/2022  2:43 PM EDT  Summary: medication question   Pt had a  fall and was seen at urgent care/ she has a question about how much tylenol to take and constipation / please advise           Call History   Type Contact Phone/Fax User  07/16/2022 02:43 PM EDT Phone (Incoming) [place], [place] D "Deanna" (Self) [phone] Lemmie Evens) Alanda Slim    Reason for Disposition  Caller has medicine question, adult has minor symptoms, caller declines triage, AND triager answers question  Answer Assessment - Initial Assessment Questions 1. NAME of MEDICINE: "What medicine(s) are you calling about?"     I broke my ankle this morning.    I don't know how much Tylenol I'm to take.   Some of my doctors have told me I should not take Tylenol due to my kidney disease.   Dr. Brita Romp.   2. QUESTION: "What is your question?" (e.g., double dose of medicine, side effect)     How much Tylenol can I take?    3. PRESCRIBER: "Who prescribed the medicine?" Reason: if prescribed by specialist, call should be referred to that group.     Dr. Brita Romp 4. SYMPTOMS: "Do you have any symptoms?" If Yes, ask: "What symptoms are you having?"  "How bad are the symptoms (e.g., mild, moderate, severe)     I need it for my broken ankle. 5. PREGNANCY:  "Is there any chance that you are pregnant?" "When was your last menstrual period?"     N/A  Protocols used: Medication Question Call-A-AH

## 2022-07-16 NOTE — Telephone Encounter (Signed)
  Chief Complaint: She broke her ankle this morning and was wanting to know how much Tylenol she could take in a day.   She has the 500 mg tablets.  Also if she gets constipated over the weekend what can she take?  Not constipated now but just in case. Symptoms: broken ankle Frequency: Happened this morning Pertinent Negatives: Patient denies being constipated presently Disposition: '[]'$ ED /'[]'$ Urgent Care (no appt availability in office) / '[]'$ Appointment(In office/virtual)/ '[]'$  [facility]/ '[x]'$ Home Care/ '[]'$ Refused Recommended Disposition /'[]'$ [facility]/ '[]'$  Follow-up with PCP Additional Notes: I let her know the maximum amount of Tylenol per day or 24 hrs was 4,000 mg.   She also looked on her bottle and confirmed it also with me.   If she had problems with constipation over the weekend I recommended Miralax powder or tablets and/or Dulcolax tablets.   She mentioned she has both there at the house and thanked me for my help.

## 2022-07-16 NOTE — Telephone Encounter (Signed)
Spoke with Dr. Garen Lah. He recommended the patient re-start Metolazone 2.5 MG once a day, and get a repeat BMP in 1 week. See telephone encounter.  Called patient and informed her of the recommendation. She stated that she does not want to re-start the metolazone at this time, she will reconsider in a few days. She stated that she broke her ankle this morning and does not want to "go through all of this again".  I advised her that this would help manage the edema in her legs, and that even though it seems better right now, that is because of all of the Torsemide she took over the last week (see previous tele encounter, as patient took more than instructed, 80 Mg twice a day).   Patient stated that she will reconsider in a few days after she is feeling better and will let us know if she starts it, as she is aware she will need to get a BMP one week after.  Dr. Garen Lah made aware.

## 2022-07-19 ENCOUNTER — Telehealth: Payer: Self-pay | Admitting: Cardiology

## 2022-07-19 DIAGNOSIS — R600 Localized edema: Secondary | ICD-10-CM

## 2022-07-19 NOTE — Telephone Encounter (Signed)
New Message:     Patient wanted to be sure that Coleen knew that she started taking the Metolazone on yesterday(07-18-22). She wanted her to know her feet are still swollen, but better than yesterday.   Pt c/o swelling: STAT is pt has developed SOB within 24 hours  If swelling, where is the swelling located? Feet and ankles  How much weight have you gained and in what time span? Not that she know  Have you gained 3 pounds in a day or 5 pounds in a week?   Do you have a log of your daily weights (if so, list)?   Are you currently taking a fluid pill? yes  Are you currently SOB?  No more than usual  Have you traveled recently? no

## 2022-07-20 NOTE — Telephone Encounter (Signed)
Spoke with patient and advised her to get a lab draw this Friday as she restarted the Metolazone on Sunday. Patient verbalized understanding and agreed with plan.

## 2022-07-21 ENCOUNTER — Telehealth: Payer: Self-pay

## 2022-07-21 NOTE — Telephone Encounter (Signed)
Copied from [place] [id number]. Topic: General - Inquiry >> Jul 21, 2022  1:46 PM Ludger Nutting wrote: Patient is requesting in temporary in [place]. She states she broke her ankle on 07/16/22 and needs extra help. Please follow up with patient.

## 2022-07-22 ENCOUNTER — Encounter: Payer: Self-pay | Admitting: Physician Assistant

## 2022-07-22 ENCOUNTER — Telehealth (INDEPENDENT_AMBULATORY_CARE_PROVIDER_SITE_OTHER): Payer: Medicare Other | Admitting: Physician Assistant

## 2022-07-22 DIAGNOSIS — S82831D Other fracture of upper and lower end of right fibula, subsequent encounter for closed fracture with routine healing: Secondary | ICD-10-CM

## 2022-07-22 NOTE — Assessment & Plan Note (Signed)
Initially seen 8/18 given walking boot and was ref to podiatry but I see no apppt- referred today Will attempt to order home health nursing but advised as she needs help with ADLs may not be covered and I am hesitant to order home PT without pt seeing podiatry first If home health not approved would refer to ccm for coordination of home health aide

## 2022-07-22 NOTE — Progress Notes (Signed)
I,Sha'taria Tyson,acting as a Education administrator for [employer], PA-C.,have documented all relevant documentation on the behalf of Gloria Kirschner, PA-C,as directed by  Gloria Kirschner, PA-C while in the presence of Gloria Kirschner, PA-C.  MyChart Video Visit    Virtual Visit via Video Note   This visit type was conducted due to national recommendations for restrictions regarding the COVID-19 Pandemic (e.g. social distancing) in an effort to limit this patient's exposure and mitigate transmission in our community. This patient is at least at moderate risk for complications without adequate follow up. This format is felt to be most appropriate for this patient at this time. Physical exam was limited by quality of the video and audio technology used for the visit.   Patient location: home Provider location: [facility]  I discussed the limitations of evaluation and management by telemedicine and the availability of in person appointments. The patient expressed understanding and agreed to proceed.  Patient: Gloria Mercer   DOB: [birth year]   81 y.o. Female  MRN: [id number] Visit Date: 07/22/2022  Today's healthcare provider: [name], PA-C   Cc. Right ankle fracture  Subjective    HPI   Pt is seen on visit today with son. She reports falling in the shower and breaking her R ankle 07/16/22. She is now in a boot and using a walker to ambulate-- reports significant pain and lack of endurance using the walker, having a difficult time managing daily tasks-- bathing, changing clothes, using restroom. She is still waiting to be evaluated by podiatry, so has not started PT yet.   Medications: Outpatient Medications Prior to Visit  Medication Sig   azelastine (ASTELIN) 0.1 % nasal spray Place 2 sprays into both nostrils daily.   Cholecalciferol (VITAMIN D3) 5000 units CAPS Take 1 capsule by mouth daily.   clotrimazole-betamethasone (LOTRISONE) cream APPLY EXTERNALLY TO THE  AFFECTED AREA TWICE DAILY AS NEEDED   Cyanocobalamin 500 MCG SUBL Place under the tongue daily.   esomeprazole (NEXIUM) 20 MG capsule Take 40 mg by mouth daily at 12 noon.    ezetimibe (ZETIA) 10 MG tablet TAKE 1 TABLET(10 MG) BY MOUTH DAILY   hydrALAZINE (APRESOLINE) 50 MG tablet Take 1 tablet (50 mg total) by mouth 3 (three) times daily.   ipratropium (ATROVENT) 0.03 % nasal spray USE 2 SPRAYS IN EACH NOSTRIL EVERY 8 HOURS AS NEEDED   levocetirizine (XYZAL) 5 MG tablet Take 5 mg by mouth every evening.   metolazone (ZAROXOLYN) 5 MG tablet Take 0.5 tablets (2.5 mg total) by mouth daily. Take 30 mins prior to your Torsemide.   montelukast (SINGULAIR) 10 MG tablet TAKE 1 TABLET(10 MG) BY MOUTH DAILY   Multiple Vitamins-Minerals (EYE VITAMINS PO) Take 2 tablets by mouth daily.    potassium chloride SA (KLOR-CON M) 20 MEQ tablet Take 2 tablets (40 meq) by mouth twice daily   pramipexole (MIRAPEX) 0.75 MG tablet TAKE 1 TABLET(0.75 MG) BY MOUTH AT BEDTIME   Tiotropium Bromide Monohydrate (SPIRIVA RESPIMAT) 2.5 MCG/ACT AERS Inhale 2 puffs into the lungs daily.   Torsemide 40 MG TABS Take 40 mg by mouth 2 (two) times daily.   triamcinolone (NASACORT) 55 MCG/ACT AERO nasal inhaler Place 2 sprays into the nose daily.   triamcinolone ointment (KENALOG) 0.5 % Apply 1 Application topically 2 (two) times daily as needed.   No facility-administered medications prior to visit.    Review of Systems  Constitutional:  Negative for fatigue and fever.  Respiratory:  Negative for cough  and shortness of breath.   Cardiovascular:  Negative for chest pain and leg swelling.  Gastrointestinal:  Negative for abdominal pain.  Musculoskeletal:  Positive for arthralgias, gait problem and joint swelling.  Neurological:  Negative for dizziness and headaches.      Objective    There were no vitals taken for this visit.    Physical Exam Constitutional:      Appearance: She is not ill-appearing.         Assessment & Plan     Problem List Items Addressed This Visit       Musculoskeletal and Integument   Closed fracture of distal end of right fibula with routine healing - Primary    Initially seen 8/18 given walking boot and was ref to podiatry but I see no apppt- referred today Will attempt to order home health nursing but advised as she needs help with ADLs may not be covered and I am hesitant to order home PT without pt seeing podiatry first If home health not approved would refer to ccm for coordination of home health aide       Relevant Orders   Ambulatory referral to [facility]   Ambulatory referral to Podiatry     Return if symptoms worsen or fail to improve.     I discussed the assessment and treatment plan with the patient. The patient was provided an opportunity to ask questions and all were answered. The patient agreed with the plan and demonstrated an understanding of the instructions.   The patient was advised to call back or seek an in-person evaluation if the symptoms worsen or if the condition fails to improve as anticipated.  I provided 10 minutes of non-face-to-face time during this encounter.  I, Gloria Kirschner, PA-C have reviewed all documentation for this visit. The documentation on  07/22/2022 for the exam, diagnosis, procedures, and orders are all accurate and complete.Gloria Kirschner, PA-C [facility] [address] [place], [place], [address] Office: [phone] Fax: [place]

## 2022-07-23 ENCOUNTER — Telehealth: Payer: Self-pay | Admitting: Cardiology

## 2022-07-23 ENCOUNTER — Other Ambulatory Visit
Admission: RE | Admit: 2022-07-23 | Discharge: 2022-07-23 | Disposition: A | Discharge status: Home / Self Care | Payer: Medicare Other | Source: Ambulatory Visit | Attending: Cardiology | Admitting: Cardiology

## 2022-07-23 ENCOUNTER — Telehealth: Payer: Self-pay

## 2022-07-23 DIAGNOSIS — R600 Localized edema: Secondary | ICD-10-CM | POA: Diagnosis not present

## 2022-07-23 LAB — BASIC METABOLIC PANEL
Anion gap: 15 (ref 5–15)
BUN: 43 mg/dL — ABNORMAL HIGH (ref 8–23)
CO2: 25 mmol/L (ref 22–32)
Calcium: 9.5 mg/dL (ref 8.9–10.3)
Chloride: 96 mmol/L — ABNORMAL LOW (ref 98–111)
Creatinine, Ser: 1.59 mg/dL — ABNORMAL HIGH (ref 0.44–1.00)
GFR, Estimated: 32 mL/min — ABNORMAL LOW (ref >60)
Glucose, Bld: 118 mg/dL — ABNORMAL HIGH (ref 70–99)
Potassium: 2.7 mmol/L — CL (ref 3.5–5.1)
Sodium: 136 mmol/L (ref 135–145)

## 2022-07-23 MED ORDER — POTASSIUM CHLORIDE CRYS ER 20 MEQ PO TBCR: 60.0000 meq | EXTENDED_RELEASE_TABLET | Freq: Two times a day (BID) | ORAL | 1 refills | Status: DC

## 2022-07-23 NOTE — Telephone Encounter (Signed)
Duplicate encounter; closing.

## 2022-07-23 NOTE — Telephone Encounter (Signed)
See duplicate encounter.

## 2022-07-23 NOTE — Telephone Encounter (Signed)
[facility] lab calling to report critical results. Transferred to [employer], Therapist, sports.

## 2022-07-23 NOTE — Telephone Encounter (Signed)
Received Critical K+ 2.7 from [place] at [place] lab.  Will discuss with Dr. Garen Lah.

## 2022-07-23 NOTE — Telephone Encounter (Signed)
Dr. Garen Lah recommended that patient increase her Potassium to 60 MEQ twice a day, and then [facility] a [facility].  Patient wrote down instructions, verbalized understanding and agreed with plan.

## 2022-07-25 DIAGNOSIS — G4730 Sleep apnea, unspecified: Secondary | ICD-10-CM | POA: Diagnosis not present

## 2022-07-25 DIAGNOSIS — I130 Hypertensive heart and chronic kidney disease with heart failure and stage 1 through stage 4 chronic kidney disease, or unspecified chronic kidney disease: Secondary | ICD-10-CM | POA: Diagnosis not present

## 2022-07-25 DIAGNOSIS — K5900 Constipation, unspecified: Secondary | ICD-10-CM | POA: Diagnosis not present

## 2022-07-25 DIAGNOSIS — S82891D Other fracture of right lower leg, subsequent encounter for closed fracture with routine healing: Secondary | ICD-10-CM | POA: Diagnosis not present

## 2022-07-25 DIAGNOSIS — H3530 Unspecified macular degeneration: Secondary | ICD-10-CM | POA: Diagnosis not present

## 2022-07-25 DIAGNOSIS — E785 Hyperlipidemia, unspecified: Secondary | ICD-10-CM | POA: Diagnosis not present

## 2022-07-25 DIAGNOSIS — K219 Gastro-esophageal reflux disease without esophagitis: Secondary | ICD-10-CM | POA: Diagnosis not present

## 2022-07-25 DIAGNOSIS — J309 Allergic rhinitis, unspecified: Secondary | ICD-10-CM | POA: Diagnosis not present

## 2022-07-25 DIAGNOSIS — E559 Vitamin D deficiency, unspecified: Secondary | ICD-10-CM | POA: Diagnosis not present

## 2022-07-25 DIAGNOSIS — D519 Vitamin B12 deficiency anemia, unspecified: Secondary | ICD-10-CM | POA: Diagnosis not present

## 2022-07-25 DIAGNOSIS — N183 Chronic kidney disease, stage 3 unspecified: Secondary | ICD-10-CM | POA: Diagnosis not present

## 2022-07-25 DIAGNOSIS — R7303 Prediabetes: Secondary | ICD-10-CM | POA: Diagnosis not present

## 2022-07-25 DIAGNOSIS — D631 Anemia in chronic kidney disease: Secondary | ICD-10-CM | POA: Diagnosis not present

## 2022-07-25 DIAGNOSIS — Z6841 Body Mass Index (BMI) 40.0 and over, adult: Secondary | ICD-10-CM | POA: Diagnosis not present

## 2022-07-25 DIAGNOSIS — I509 Heart failure, unspecified: Secondary | ICD-10-CM | POA: Diagnosis not present

## 2022-07-25 DIAGNOSIS — M1990 Unspecified osteoarthritis, unspecified site: Secondary | ICD-10-CM | POA: Diagnosis not present

## 2022-07-25 DIAGNOSIS — G2581 Restless legs syndrome: Secondary | ICD-10-CM | POA: Diagnosis not present

## 2022-07-30 ENCOUNTER — Other Ambulatory Visit: Payer: Self-pay

## 2022-07-30 ENCOUNTER — Other Ambulatory Visit
Admission: RE | Admit: 2022-07-30 | Discharge: 2022-07-30 | Disposition: A | Discharge status: Home / Self Care | Payer: Medicare Other | Attending: Cardiology | Admitting: Cardiology

## 2022-07-30 ENCOUNTER — Telehealth: Payer: Self-pay | Admitting: Cardiology

## 2022-07-30 DIAGNOSIS — E876 Hypokalemia: Secondary | ICD-10-CM

## 2022-07-30 DIAGNOSIS — I10 Essential (primary) hypertension: Secondary | ICD-10-CM | POA: Diagnosis not present

## 2022-07-30 LAB — BASIC METABOLIC PANEL
Anion gap: 14 (ref 5–15)
BUN: 54 mg/dL — ABNORMAL HIGH (ref 8–23)
CO2: 30 mmol/L (ref 22–32)
Calcium: 9.7 mg/dL (ref 8.9–10.3)
Chloride: 94 mmol/L — ABNORMAL LOW (ref 98–111)
Creatinine, Ser: 1.53 mg/dL — ABNORMAL HIGH (ref 0.44–1.00)
GFR, Estimated: 34 mL/min — ABNORMAL LOW (ref >60)
Glucose, Bld: 133 mg/dL — ABNORMAL HIGH (ref 70–99)
Potassium: 2.5 mmol/L — CL (ref 3.5–5.1)
Sodium: 138 mmol/L (ref 135–145)

## 2022-07-30 MED ORDER — SPIRONOLACTONE 25 MG PO TABS: 25.0000 mg | ORAL_TABLET | Freq: Every day | ORAL | 3 refills | Status: DC

## 2022-07-30 MED ORDER — POTASSIUM CHLORIDE CRYS ER 20 MEQ PO TBCR: 80.0000 meq | EXTENDED_RELEASE_TABLET | Freq: Two times a day (BID) | ORAL | 1 refills | Status: DC

## 2022-07-30 NOTE — Telephone Encounter (Signed)
Caller is reporting critical lab results.

## 2022-07-30 NOTE — Telephone Encounter (Signed)
Received a call from the [place] at the [facility] with a critical K + 2.5.  Spoke with Dr. Garen Lah and he recommended that the patient increase Potassium to 80 MEQ twice a day, Start taking Aldactone 25 MG once a day, and get a repeat BMP in 1 week.

## 2022-08-03 DIAGNOSIS — S82831A Other fracture of upper and lower end of right fibula, initial encounter for closed fracture: Secondary | ICD-10-CM | POA: Diagnosis not present

## 2022-08-06 ENCOUNTER — Other Ambulatory Visit
Admission: RE | Admit: 2022-08-06 | Discharge: 2022-08-06 | Disposition: A | Discharge status: Home / Self Care | Payer: Medicare Other | Attending: Cardiology | Admitting: Cardiology

## 2022-08-06 ENCOUNTER — Telehealth: Payer: Self-pay

## 2022-08-06 DIAGNOSIS — I509 Heart failure, unspecified: Secondary | ICD-10-CM

## 2022-08-06 DIAGNOSIS — E876 Hypokalemia: Secondary | ICD-10-CM | POA: Insufficient documentation

## 2022-08-06 LAB — BASIC METABOLIC PANEL
Anion gap: 14 (ref 5–15)
BUN: 53 mg/dL — ABNORMAL HIGH (ref 8–23)
CO2: 26 mmol/L (ref 22–32)
Calcium: 9.6 mg/dL (ref 8.9–10.3)
Chloride: 96 mmol/L — ABNORMAL LOW (ref 98–111)
Creatinine, Ser: 1.43 mg/dL — ABNORMAL HIGH (ref 0.44–1.00)
GFR, Estimated: 37 mL/min — ABNORMAL LOW (ref >60)
Glucose, Bld: 124 mg/dL — ABNORMAL HIGH (ref 70–99)
Potassium: 3.1 mmol/L — ABNORMAL LOW (ref 3.5–5.1)
Sodium: 136 mmol/L (ref 135–145)

## 2022-08-06 NOTE — Telephone Encounter (Signed)
-----   Message from Kate Sable, MD sent at 08/06/2022 12:55 PM EDT ----- Potassium improving, creatinine stable.  Continue medications as prescribed.  Recheck BMP in 1 week.

## 2022-08-06 NOTE — Telephone Encounter (Signed)
Called patient and discussed result note as documented below. Patient verbalized understanding, she stated that she might not have a ride available next Friday, and would try and get to the lab either Thursday, or the following Monday.  Patient was grateful for the follow up.

## 2022-08-10 ENCOUNTER — Ambulatory Visit: Payer: Self-pay | Admitting: *Deleted

## 2022-08-10 NOTE — Telephone Encounter (Signed)
Summary: Pt experiencing severe constipation   Pt reports that she is experiencing severe constipation and it is very uncomfortable for her to sit. Pt denies any abdominal pain at this time but requests to speak with a nurse. Cb# [id number]        Chief Complaint: constipation, severe pain  Symptoms: LBM 1-2 days ago. Now severe rectal pain. Reports stool "right there" but will not pass. Patient has been straining to have BM. Chills, sound in pain. Nausea decreased appetite. Able to tolerate fluids. Difficulty sitting hx heart issues Frequency: today  Pertinent Negatives: Patient denies chest pain, no vomiting  Disposition: '[x]'$ ED /'[]'$ Urgent Care (no appt availability in office) / '[]'$ Appointment(In office/virtual)/ '[]'$  [place] [facility]/ '[]'$ Home Care/ '[]'$ Refused Recommended Disposition /'[]'$ [facility]/ '[]'$  Follow-up with PCP Additional Notes:   Recommended patient go to ED for possible disimpaction. Hx heart issues and patient has been unsuccessful with medication and warm wash cloth to rectal area. Please advise .      Reason for Disposition  [1] Rectal pain or fullness from fecal impaction (rectum full of stool) AND [2] NOT better after SITZ bath, suppository or enema  Answer Assessment - Initial Assessment Questions 1. STOOL PATTERN OR FREQUENCY: "How often do you have a bowel movement (BM)?"  (Normal range: 3 times a day to every 3 days)  "When was your last BM?"       LBM 1-2 days ago not formed 2. STRAINING: "Do you have to strain to have a BM?"      Yes  3. RECTAL PAIN: "Does your rectum hurt when the stool comes out?" If Yes, ask: "Do you have hemorrhoids? How bad is the pain?"  (Scale 1-10; or mild, moderate, severe)     Yes pain in rectum. Reports stool is "right there" but will not pass 4. STOOL COMPOSITION: "Are the stools hard?"      No . 1-2 days ago stool not formed per patient  5. BLOOD ON STOOLS: "Has there been any blood on the toilet tissue or on the  surface of the BM?" If Yes, ask: "When was the last time?"     no 6. CHRONIC CONSTIPATION: "Is this a new problem for you?"  If No, ask: "How long have you had this problem?" (days, weeks, months)      No . Problems since April 7. CHANGES IN DIET OR HYDRATION: "Have there been any recent changes in your diet?" "How much fluids are you drinking on a daily basis?"  "How much have you had to drink today?"     Yesterday "sick on stomach" able to drink fluids but unable to eat much  8. MEDICINES: "Have you been taking any new medicines?" "Are you taking any narcotic pain medicines?" (e.g., Dilaudid, morphine, Percocet, Vicodin)     no 9. LAXATIVES: "Have you been using any stool softeners, laxatives, or enemas?"  If Yes, ask "What, how often, and when was the last time?"     Used dulcolax tab last night and miralax this am  10. ACTIVITY:  "How much walking do you do every day?"  "Has your activity level decreased in the past week?"        Decreased mobility due to ankle  11. CAUSE: "What do you think is causing the constipation?"        na 12. OTHER SYMPTOMS: "Do you have any other symptoms?" (e.g., abdomen pain, bloating, fever, vomiting)       Nausea unable to eat  13. MEDICAL HISTORY: "Do you have a history of hemorrhoids, rectal fissures, or rectal surgery or rectal abscess?"         No  14. PREGNANCY: "Is there any chance you are pregnant?" "When was your last menstrual period?"       na  Protocols used: Constipation-A-AH

## 2022-08-11 ENCOUNTER — Encounter: Payer: Self-pay | Admitting: Cardiology

## 2022-08-11 NOTE — Telephone Encounter (Signed)
error 

## 2022-08-12 ENCOUNTER — Emergency Department
Admission: EM | Admit: 2022-08-12 | Discharge: 2022-08-12 | Disposition: A | Discharge status: Home / Self Care | Payer: Medicare Other | Attending: Emergency Medicine | Admitting: Emergency Medicine

## 2022-08-12 ENCOUNTER — Other Ambulatory Visit: Payer: Self-pay

## 2022-08-12 ENCOUNTER — Emergency Department: Payer: Medicare Other

## 2022-08-12 DIAGNOSIS — R109 Unspecified abdominal pain: Secondary | ICD-10-CM | POA: Diagnosis not present

## 2022-08-12 DIAGNOSIS — K5900 Constipation, unspecified: Secondary | ICD-10-CM | POA: Insufficient documentation

## 2022-08-12 DIAGNOSIS — I130 Hypertensive heart and chronic kidney disease with heart failure and stage 1 through stage 4 chronic kidney disease, or unspecified chronic kidney disease: Secondary | ICD-10-CM | POA: Diagnosis not present

## 2022-08-12 DIAGNOSIS — N183 Chronic kidney disease, stage 3 unspecified: Secondary | ICD-10-CM | POA: Insufficient documentation

## 2022-08-12 DIAGNOSIS — I509 Heart failure, unspecified: Secondary | ICD-10-CM | POA: Diagnosis not present

## 2022-08-12 LAB — COMPREHENSIVE METABOLIC PANEL
ALT: 12 U/L (ref 0–44)
AST: 20 U/L (ref 15–41)
Albumin: 3.5 g/dL (ref 3.5–5.0)
Alkaline Phosphatase: 91 U/L (ref 38–126)
Anion gap: 15 (ref 5–15)
BUN: 45 mg/dL — ABNORMAL HIGH (ref 8–23)
CO2: 27 mmol/L (ref 22–32)
Calcium: 9.6 mg/dL (ref 8.9–10.3)
Chloride: 92 mmol/L — ABNORMAL LOW (ref 98–111)
Creatinine, Ser: 1.45 mg/dL — ABNORMAL HIGH (ref 0.44–1.00)
GFR, Estimated: 36 mL/min — ABNORMAL LOW (ref >60)
Glucose, Bld: 137 mg/dL — ABNORMAL HIGH (ref 70–99)
Potassium: 4.2 mmol/L (ref 3.5–5.1)
Sodium: 134 mmol/L — ABNORMAL LOW (ref 135–145)
Total Bilirubin: 1.2 mg/dL (ref 0.3–1.2)
Total Protein: 6.9 g/dL (ref 6.5–8.1)

## 2022-08-12 LAB — CBC WITH DIFFERENTIAL/PLATELET
Abs Immature Granulocytes: 0.03 10*3/uL (ref 0.00–0.07)
Basophils Absolute: 0 10*3/uL (ref 0.0–0.1)
Basophils Relative: 0 %
Eosinophils Absolute: 0.1 10*3/uL (ref 0.0–0.5)
Eosinophils Relative: 1 %
HCT: 37.2 % (ref 36.0–46.0)
Hemoglobin: 12.8 g/dL (ref 12.0–15.0)
Immature Granulocytes: 0 %
Lymphocytes Relative: 19 %
Lymphs Abs: 1.9 10*3/uL (ref 0.7–4.0)
MCH: 30.5 pg (ref 26.0–34.0)
MCHC: 34.4 g/dL (ref 30.0–36.0)
MCV: 88.8 fL (ref 80.0–100.0)
Monocytes Absolute: 0.8 10*3/uL (ref 0.1–1.0)
Monocytes Relative: 8 %
Neutro Abs: 7.2 10*3/uL (ref 1.7–7.7)
Neutrophils Relative %: 72 %
Platelets: 270 10*3/uL (ref 150–400)
RBC: 4.19 MIL/uL (ref 3.87–5.11)
RDW: 12.9 % (ref 11.5–15.5)
WBC: 10 10*3/uL (ref 4.0–10.5)
nRBC: 0 % (ref 0.0–0.2)

## 2022-08-12 MED ORDER — POLYETHYLENE GLYCOL 3350 17 G PO PACK
17.0000 g | PACK | Freq: Every day | ORAL | Status: DC
  Administered 2022-08-12: 17 g via ORAL
  Filled 2022-08-12: qty 1

## 2022-08-12 MED ORDER — MILK AND MOLASSES ENEMA
1.0000 | Freq: Once | RECTAL | Status: AC
  Administered 2022-08-12: 240 mL via RECTAL
  Filled 2022-08-12: qty 240

## 2022-08-12 NOTE — Discharge Instructions (Addendum)
-  Please continue your over-the-counter laxatives until you feel fully relieved.  You may additionally try some over-the-counter enemas as well home.  Review the educational material provided as well.  Please hydrate frequently and increase the fiber in your diet.  -Please follow-up with your primary care provider to review your medications to see if adjustments can be made to reduce the recurrence of your constipation.  -Return to the emergency department at any time if you begin to experience any new or worsening symptoms.  -Thank you for letting us take care of you today!

## 2022-08-12 NOTE — ED Triage Notes (Signed)
Pt states that she has been constipated since Sunday- pt states she is having water stools around the hard stool- pt states this has happened to her before and was seen here for it a month ago- pt tried to do the senna and mag citrate at home like last time but it did not work

## 2022-08-12 NOTE — ED Provider Notes (Signed)
[facility] Provider Note    Event Date/Time   First MD Initiated Contact with Patient 08/12/22 [id number]     (approximate)   History   Chief Complaint Constipation   HPI Gloria Mercer is a 81 y.o. female, history of hypertension, GERD, RLS, morbid obesity, CHF, CKD stage III, presents to the emergency department for evaluation of constipation.  Patient states is a chronic issue for her.  She attributes that to her medications.  She has reportedly not had a bowel movement in approximately 1 week.  She has had some watery stool, but no formed stool.  Reports some mild abdominal cramping, though no significant pain or other symptoms.  She has tried magnesium citrate and senna at home, which usually works for her, but has not this time.  She was seen here over the past couple months for the same thing which required a fecal disimpaction.  Denies fever/chills, chest pain, shortness of breath, flank pain, nausea/vomiting, dizziness/lightheadedness, rash/lesions, headache, or dysuria.  History Limitations: No limitations.        Physical Exam  Triage Vital Signs: ED Triage Vitals  Enc Vitals Group     BP 08/12/22 0923 108/61     Pulse Rate 08/12/22 0923 77     Resp 08/12/22 0923 18     Temp 08/12/22 0923 97.6 F (36.4 C)     Temp Source 08/12/22 0923 Oral     SpO2 08/12/22 0923 97 %     Weight 08/12/22 0924 216 lb (98 kg)     Height 08/12/22 0924 '5\' 1"'$  (1.549 m)     Head Circumference --      Peak Flow --      Pain Score 08/12/22 0924 6     Pain Loc --      Pain Edu? --      Excl. in [place]? --     Most recent vital signs: Vitals:   08/12/22 0923 08/12/22 1319  BP: 108/61 110/60  Pulse: 77 70  Resp: 18 18  Temp: 97.6 F (36.4 C)   SpO2: 97% 98%    General: Awake, appears uncomfortable. Skin: Warm, dry. No rashes or lesions.  Eyes: PERRL. Conjunctivae normal.  CV: Good peripheral perfusion.  Resp: Normal effort.  Abd: Soft, non-tender. No  distention.  Neuro: At baseline. No gross neurological deficits.  Musculoskeletal: Normal ROM of all extremities.   Focused Exam: Hard, impacted stool in the rectal vault.  No anal fissures.  No obvious hemorrhoids.  No rectal bleeding or discharge.  Physical Exam    ED Results / Procedures / Treatments  Labs (all labs ordered are listed, but only abnormal results are displayed) Labs Reviewed  COMPREHENSIVE METABOLIC PANEL - Abnormal; Notable for the following components:      Result Value   Sodium 134 (*)    Chloride 92 (*)    Glucose, Bld 137 (*)    BUN 45 (*)    Creatinine, Ser 1.45 (*)    GFR, Estimated 36 (*)    All other components within normal limits  CBC WITH DIFFERENTIAL/PLATELET  URINALYSIS, ROUTINE W REFLEX MICROSCOPIC     EKG N/A.   RADIOLOGY  ED Provider Interpretation: I personally viewed and interpreted this x-ray, no evidence of bowel obstruction or acute findings.  DG Abd Portable 1 View  Result Date: 08/12/2022 CLINICAL DATA:  Constipation EXAM: PORTABLE ABDOMEN - 1 VIEW COMPARISON:  03/18/2022 FINDINGS: The bowel gas pattern is normal. No radio-opaque calculi  or other significant radiographic abnormality are seen. IMPRESSION: Negative. Electronically Signed   By: Kathreen Devoid M.D.   On: 08/12/2022 10:02    PROCEDURES:  Critical Care performed: N/A.  Procedures    MEDICATIONS ORDERED IN ED: Medications  polyethylene glycol (MIRALAX / GLYCOLAX) packet 17 g (17 g Oral Given 08/12/22 1057)  milk and molasses enema (240 mLs Rectal Given by Other 08/12/22 1252)     IMPRESSION / MDM / ASSESSMENT AND PLAN / ED COURSE  I reviewed the triage vital signs and the nursing notes.                              Differential diagnosis includes, but is not limited to, constipation, small bowel obstruction, large bowel obstruction, fecal impaction  ED Course Patient appears clinically well, vitals within normal limits.  NAD.  CBC shows no leukocytosis or  anemia.  CMP shows elevated creatinine 1.45, consistent with her baseline values.  No significant electrolyte abnormalities.  No transaminitis.   Assessment/Plan Patient presents with constipation x1 week refractory to over-the-counter laxatives/cathartics.  Abdominal x-ray does not show any significant findings, though she does have notable impaction in the rectal vault.  I was able to disimpact manually with the help of milk/molasses enema.  She was able to get a moderate amount of stool out as a result.  She does not have any significant abdominal pain/tenderness.  Low suspicion for any bowel obstruction or acute pathology.  Lab work-up is reassuring.  Currently pending urinalysis, though patient states that she is ready go home.  Very low suspicion for any urinary pathology given the lack of urinary symptoms.  Advised her to continue her over-the-counter laxatives over the next few days until she has full relief.  Additionally advised her to follow-up with her primary care provider to review her medications to see if there are changes that can be made to reduce her constipation.  With discharge.  Provided the patient with anticipatory guidance, return precautions, and educational material. Encouraged the patient to return to the emergency department at any time if they begin to experience any new or worsening symptoms. Patient expressed understanding and agreed with the plan.   Patient's presentation is most consistent with acute complicated illness / injury requiring diagnostic workup.       FINAL CLINICAL IMPRESSION(S) / ED DIAGNOSES   Final diagnoses:  Constipation, unspecified constipation type     Rx / DC Orders   ED Discharge Orders     None        Note:  This document was prepared using Dragon voice recognition software and may include unintentional dictation errors.   Teodoro Spray, [place] 08/12/22 1540    Duffy Bruce, MD 08/17/22 [phone]

## 2022-08-13 ENCOUNTER — Ambulatory Visit: Payer: Medicare Other | Admitting: Cardiology

## 2022-08-13 DIAGNOSIS — M1990 Unspecified osteoarthritis, unspecified site: Secondary | ICD-10-CM | POA: Diagnosis not present

## 2022-08-13 DIAGNOSIS — D631 Anemia in chronic kidney disease: Secondary | ICD-10-CM | POA: Diagnosis not present

## 2022-08-13 DIAGNOSIS — N183 Chronic kidney disease, stage 3 unspecified: Secondary | ICD-10-CM | POA: Diagnosis not present

## 2022-08-13 DIAGNOSIS — I130 Hypertensive heart and chronic kidney disease with heart failure and stage 1 through stage 4 chronic kidney disease, or unspecified chronic kidney disease: Secondary | ICD-10-CM | POA: Diagnosis not present

## 2022-08-13 DIAGNOSIS — I509 Heart failure, unspecified: Secondary | ICD-10-CM | POA: Diagnosis not present

## 2022-08-13 DIAGNOSIS — S82891D Other fracture of right lower leg, subsequent encounter for closed fracture with routine healing: Secondary | ICD-10-CM | POA: Diagnosis not present

## 2022-08-17 DIAGNOSIS — N183 Chronic kidney disease, stage 3 unspecified: Secondary | ICD-10-CM | POA: Diagnosis not present

## 2022-08-17 DIAGNOSIS — M1990 Unspecified osteoarthritis, unspecified site: Secondary | ICD-10-CM | POA: Diagnosis not present

## 2022-08-17 DIAGNOSIS — I509 Heart failure, unspecified: Secondary | ICD-10-CM | POA: Diagnosis not present

## 2022-08-17 DIAGNOSIS — I130 Hypertensive heart and chronic kidney disease with heart failure and stage 1 through stage 4 chronic kidney disease, or unspecified chronic kidney disease: Secondary | ICD-10-CM | POA: Diagnosis not present

## 2022-08-17 DIAGNOSIS — D631 Anemia in chronic kidney disease: Secondary | ICD-10-CM | POA: Diagnosis not present

## 2022-08-17 DIAGNOSIS — S82891D Other fracture of right lower leg, subsequent encounter for closed fracture with routine healing: Secondary | ICD-10-CM | POA: Diagnosis not present

## 2022-08-18 ENCOUNTER — Telehealth: Payer: Self-pay | Admitting: *Deleted

## 2022-08-18 NOTE — Telephone Encounter (Signed)
        Patient  visited [facility] on 08/12/2022  for constipation   Telephone encounter attempt :  1st  A HIPAA compliant voice message was left requesting a return call.  Instructed patient to call back at [phone].  [place] [phone] [address] , [place] [address] Email : [email or web address]

## 2022-08-24 DIAGNOSIS — E785 Hyperlipidemia, unspecified: Secondary | ICD-10-CM | POA: Diagnosis not present

## 2022-08-24 DIAGNOSIS — G2581 Restless legs syndrome: Secondary | ICD-10-CM | POA: Diagnosis not present

## 2022-08-24 DIAGNOSIS — N183 Chronic kidney disease, stage 3 unspecified: Secondary | ICD-10-CM | POA: Diagnosis not present

## 2022-08-24 DIAGNOSIS — S82891D Other fracture of right lower leg, subsequent encounter for closed fracture with routine healing: Secondary | ICD-10-CM | POA: Diagnosis not present

## 2022-08-24 DIAGNOSIS — Z6841 Body Mass Index (BMI) 40.0 and over, adult: Secondary | ICD-10-CM | POA: Diagnosis not present

## 2022-08-24 DIAGNOSIS — K5900 Constipation, unspecified: Secondary | ICD-10-CM | POA: Diagnosis not present

## 2022-08-24 DIAGNOSIS — H3530 Unspecified macular degeneration: Secondary | ICD-10-CM | POA: Diagnosis not present

## 2022-08-24 DIAGNOSIS — D519 Vitamin B12 deficiency anemia, unspecified: Secondary | ICD-10-CM | POA: Diagnosis not present

## 2022-08-24 DIAGNOSIS — R7303 Prediabetes: Secondary | ICD-10-CM | POA: Diagnosis not present

## 2022-08-24 DIAGNOSIS — I130 Hypertensive heart and chronic kidney disease with heart failure and stage 1 through stage 4 chronic kidney disease, or unspecified chronic kidney disease: Secondary | ICD-10-CM | POA: Diagnosis not present

## 2022-08-24 DIAGNOSIS — J309 Allergic rhinitis, unspecified: Secondary | ICD-10-CM | POA: Diagnosis not present

## 2022-08-24 DIAGNOSIS — S82831D Other fracture of upper and lower end of right fibula, subsequent encounter for closed fracture with routine healing: Secondary | ICD-10-CM | POA: Diagnosis not present

## 2022-08-24 DIAGNOSIS — D631 Anemia in chronic kidney disease: Secondary | ICD-10-CM | POA: Diagnosis not present

## 2022-08-24 DIAGNOSIS — M1990 Unspecified osteoarthritis, unspecified site: Secondary | ICD-10-CM | POA: Diagnosis not present

## 2022-08-24 DIAGNOSIS — K219 Gastro-esophageal reflux disease without esophagitis: Secondary | ICD-10-CM | POA: Diagnosis not present

## 2022-08-24 DIAGNOSIS — I509 Heart failure, unspecified: Secondary | ICD-10-CM | POA: Diagnosis not present

## 2022-08-24 DIAGNOSIS — E559 Vitamin D deficiency, unspecified: Secondary | ICD-10-CM | POA: Diagnosis not present

## 2022-08-24 DIAGNOSIS — G4730 Sleep apnea, unspecified: Secondary | ICD-10-CM | POA: Diagnosis not present

## 2022-08-26 DIAGNOSIS — N183 Chronic kidney disease, stage 3 unspecified: Secondary | ICD-10-CM | POA: Diagnosis not present

## 2022-08-26 DIAGNOSIS — D631 Anemia in chronic kidney disease: Secondary | ICD-10-CM | POA: Diagnosis not present

## 2022-08-26 DIAGNOSIS — I509 Heart failure, unspecified: Secondary | ICD-10-CM | POA: Diagnosis not present

## 2022-08-26 DIAGNOSIS — S82891D Other fracture of right lower leg, subsequent encounter for closed fracture with routine healing: Secondary | ICD-10-CM | POA: Diagnosis not present

## 2022-08-26 DIAGNOSIS — I130 Hypertensive heart and chronic kidney disease with heart failure and stage 1 through stage 4 chronic kidney disease, or unspecified chronic kidney disease: Secondary | ICD-10-CM | POA: Diagnosis not present

## 2022-08-26 DIAGNOSIS — M1990 Unspecified osteoarthritis, unspecified site: Secondary | ICD-10-CM | POA: Diagnosis not present

## 2022-08-27 ENCOUNTER — Other Ambulatory Visit
Admission: RE | Admit: 2022-08-27 | Discharge: 2022-08-27 | Disposition: A | Discharge status: Home / Self Care | Payer: Medicare Other | Source: Ambulatory Visit | Attending: Cardiology | Admitting: Cardiology

## 2022-08-27 ENCOUNTER — Ambulatory Visit: Payer: Medicare Other | Attending: Cardiology | Admitting: Cardiology

## 2022-08-27 ENCOUNTER — Encounter: Payer: Self-pay | Admitting: Cardiology

## 2022-08-27 VITALS — BP 128/68 | HR 82 | Ht 61.0 in | Wt 219.6 lb

## 2022-08-27 DIAGNOSIS — I10 Essential (primary) hypertension: Secondary | ICD-10-CM | POA: Diagnosis not present

## 2022-08-27 DIAGNOSIS — I509 Heart failure, unspecified: Secondary | ICD-10-CM | POA: Insufficient documentation

## 2022-08-27 DIAGNOSIS — R600 Localized edema: Secondary | ICD-10-CM | POA: Insufficient documentation

## 2022-08-27 LAB — BASIC METABOLIC PANEL
Anion gap: 8 (ref 5–15)
BUN: 30 mg/dL — ABNORMAL HIGH (ref 8–23)
CO2: 27 mmol/L (ref 22–32)
Calcium: 9.5 mg/dL (ref 8.9–10.3)
Chloride: 101 mmol/L (ref 98–111)
Creatinine, Ser: 1.31 mg/dL — ABNORMAL HIGH (ref 0.44–1.00)
GFR, Estimated: 41 mL/min — ABNORMAL LOW (ref >60)
Glucose, Bld: 124 mg/dL — ABNORMAL HIGH (ref 70–99)
Potassium: 3.9 mmol/L (ref 3.5–5.1)
Sodium: 136 mmol/L (ref 135–145)

## 2022-08-27 MED ORDER — TORSEMIDE 40 MG PO TABS: 60.0000 mg | ORAL_TABLET | Freq: Two times a day (BID) | ORAL | 3 refills | Status: DC

## 2022-08-27 MED ORDER — POTASSIUM CHLORIDE CRYS ER 20 MEQ PO TBCR: 40.0000 meq | EXTENDED_RELEASE_TABLET | Freq: Two times a day (BID) | ORAL | 1 refills | Status: DC

## 2022-08-27 NOTE — Progress Notes (Signed)
Cardiology Office Note:    Date:  08/27/2022   ID:  Gloria Mercer, DOB 10/09/1941, MRN [id number]  PCP:  Virginia Crews, MD   [facility]  Cardiologist:  Kate Sable, MD  Advanced Practice Provider:  No care team member to display Electrophysiologist:  None       Referring MD: Virginia Crews, MD   Chief Complaint  Patient presents with   Follow-up    6-8 week follow up, no new cardiac concerns    History of Present Illness:    Gloria Mercer is a 81 y.o. female with a hx of hypertension, obesity, CKD presents for follow-up.   Being seen for leg edema, medication management.  Torsemide previously increased to 40 mg twice daily, metolazone added.  She developed hypokalemia. potassium supplements, Aldactone was started.  Last electrolyte check showed normal potassium, creatinine stable.  Also developed constipation with metolazone, this was stopped.  She still has edema.  Has an upcoming appointment with nephrology.  [place] about a week ago, fracturing ankle, currently immobilized in an Haematologist.   Prior notes Echocardiogram 7/82/4235 normal systolic function, impaired relaxation, EF 55 to 60%. Not tolerant to several BP meds including losartan, metoprolol, lisinopril.   Past Medical History:  Diagnosis Date   Allergic rhinitis    on allergy shots, sees Dr. Pryor Ochoa   Anemia    Arthritis    B12 deficiency    Back pain    Bilateral edema of lower extremity    Cataract    CHF (congestive heart failure) (HCC)    Constipation    Edema    Gallbladder problem    GERD (gastroesophageal reflux disease)    History of stomach ulcers    Hyperlipidemia    Hypertension    Joint pain    Macular degeneration    followed by Dr Garwin Brothers at [facility]   Osteoarthritis    Other fatigue    Prediabetes    Proteinuria    RLS (restless legs syndrome)    Sciatica    Shortness of breath    Shortness of breath on exertion    Sleep apnea    Stage  3 chronic kidney disease (HCC)    Swallowing difficulty    Vitamin D deficiency     Past Surgical History:  Procedure Laterality Date   CHOLECYSTECTOMY  2002   TUBAL LIGATION  1978    Current Medications: Current Meds  Medication Sig   azelastine (ASTELIN) 0.1 % nasal spray Place 2 sprays into both nostrils daily.   Cholecalciferol (VITAMIN D3) 5000 units CAPS Take 1 capsule by mouth daily.   clotrimazole-betamethasone (LOTRISONE) cream APPLY EXTERNALLY TO THE AFFECTED AREA TWICE DAILY AS NEEDED   Cyanocobalamin 500 MCG SUBL Place under the tongue daily.   esomeprazole (NEXIUM) 20 MG capsule Take 40 mg by mouth daily at 12 noon.    ezetimibe (ZETIA) 10 MG tablet TAKE 1 TABLET(10 MG) BY MOUTH DAILY   hydrALAZINE (APRESOLINE) 50 MG tablet Take 1 tablet (50 mg total) by mouth 3 (three) times daily.   ipratropium (ATROVENT) 0.03 % nasal spray USE 2 SPRAYS IN EACH NOSTRIL EVERY 8 HOURS AS NEEDED   levocetirizine (XYZAL) 5 MG tablet Take 5 mg by mouth every evening.   montelukast (SINGULAIR) 10 MG tablet TAKE 1 TABLET(10 MG) BY MOUTH DAILY   Multiple Vitamins-Minerals (EYE VITAMINS PO) Take 2 tablets by mouth daily.    pramipexole (MIRAPEX) 0.75 MG tablet TAKE 1  TABLET(0.75 MG) BY MOUTH AT BEDTIME   spironolactone (ALDACTONE) 25 MG tablet Take 1 tablet (25 mg total) by mouth daily.   Tiotropium Bromide Monohydrate (SPIRIVA RESPIMAT) 2.5 MCG/ACT AERS Inhale 2 puffs into the lungs daily.   triamcinolone (NASACORT) 55 MCG/ACT AERO nasal inhaler Place 2 sprays into the nose daily.   triamcinolone ointment (KENALOG) 0.5 % Apply 1 Application topically 2 (two) times daily as needed.   [DISCONTINUED] potassium chloride SA (KLOR-CON M) 20 MEQ tablet Take 4 tablets (80 mEq total) by mouth 2 (two) times daily. (Patient taking differently: Take 80 mEq by mouth 2 (two) times daily. 2 pill twice daily)   [DISCONTINUED] Torsemide 40 MG TABS Take 40 mg by mouth 2 (two) times daily.     Allergies:    Erythromycin, Losartan, Metoprolol, Simvastatin, Dexlansoprazole, Lisinopril, and Prednisone   Social History   Socioeconomic History   Marital status: Divorced    Spouse name: Not on file   Number of children: 1   Years of education: Not on file   Highest education level: Bachelor's degree (e.g., BA, AB, BS)  Occupational History   Occupation: retired in 2001    Comment: social work Scientist, physiological  Tobacco Use   Smoking status: Former    Packs/day: 1.00    Years: 10.00    Total pack years: 10.00    Types: Cigarettes    Start date: 11/29/1965    Quit date: 11/28/1976    Years since quitting: 45.7   Smokeless tobacco: Never  Vaping Use   Vaping Use: Never used  Substance and Sexual Activity   Alcohol use: Not Currently    Comment: rarely, at holidays   Drug use: No   Sexual activity: Not Currently  Other Topics Concern   Not on file  Social History Narrative   Not on file   Social Determinants of Health   Financial Resource Strain: Low Risk  (02/24/2022)   Overall Financial Resource Strain (CARDIA)    Difficulty of Paying Living Expenses: Not hard at all  Food Insecurity: No Food Insecurity (02/24/2022)   Hunger Vital Sign    Worried About Running Out of Food in the Last Year: Never true    [place] in the Last Year: Never true  Transportation Needs: No Transportation Needs (02/24/2022)   PRAPARE - Hydrologist (Medical): No    Lack of Transportation (Non-Medical): No  Physical Activity: Inactive (02/24/2022)   Exercise Vital Sign    Days of Exercise per Week: 0 days    Minutes of Exercise per Session: 0 min  Stress: No Stress Concern Present (02/24/2022)   [place]    Feeling of Stress : Not at all  Social Connections: Unknown (02/24/2022)   Social Connection and Isolation Panel [NHANES]    Frequency of Communication with Friends and Family: More than three times a  week    Frequency of Social Gatherings with Friends and Family: Once a week    Attends Religious Services: Never    Marine scientist or Organizations: No    Attends Music therapist: Never    Marital Status: Not on file     Family History: The patient's family history includes Diabetes in her father; Heart disease (age of onset: 109) in her father; Hyperlipidemia in her mother; Hypertension in her father and mother; Myelodysplastic syndrome in her sister; Prostate cancer in her paternal grandfather; Seizures in  her mother. There is no history of Breast cancer or Cervical cancer.  ROS:   Please see the history of present illness.     All other systems reviewed and are negative.  EKGs/Labs/Other Studies Reviewed:    The following studies were reviewed today:   EKG:  EKG is ordered today.  EKG shows sinus rhythm, PACs.  Recent Labs: 03/02/2022: TSH 3.920 03/18/2022: Magnesium 1.9 08/12/2022: ALT 12; BUN 45; Creatinine, Ser 1.45; Hemoglobin 12.8; Platelets 270; Potassium 4.2; Sodium 134  Recent Lipid Panel    Component Value Date/Time   CHOL 198 12/25/2021 1059   TRIG 159 (H) 12/25/2021 1059   HDL 62 12/25/2021 1059   CHOLHDL 3.2 12/25/2021 1059   LDLCALC 108 (H) 12/25/2021 1059     Risk Assessment/Calculations:      Physical Exam:    VS:  BP 128/68 (BP Location: Right Arm, Patient Position: Sitting, Cuff Size: Normal)   Pulse 82   Ht '5\' 1"'$  (1.549 m)   Wt 219 lb 9.6 oz (99.6 kg)   SpO2 98%   BMI 41.49 kg/m     Wt Readings from Last 3 Encounters:  08/27/22 219 lb 9.6 oz (99.6 kg)  08/12/22 216 lb (98 kg)  07/02/22 223 lb (101.2 kg)     GEN:  Well nourished, well developed in no acute distress HEENT: Normal NECK: Unable to assess JVD due to body habitus CARDIAC: RRR, no murmurs, rubs, gallops RESPIRATORY: Decreased breath sounds at bases ABDOMEN: Soft, non-tender, non-distended MUSCULOSKELETAL:  2+ edema; right foot in [place] boot SKIN: Warm and  dry NEUROLOGIC:  Alert and oriented x 3 PSYCHIATRIC:  Normal affect   ASSESSMENT:    1. Leg edema   2. Heart failure, unspecified HF chronicity, unspecified heart failure type ([place])   3. Primary hypertension   4. Morbid obesity ([place])    PLAN:    In order of problems listed above:  HFpEF, bilateral lower extremity edema, echo with normal systolic function, impaired relaxation.  Increase torsemide 60 mg twice daily.  Check BMP today and in 1 week.  Continue Aldactone. Hypertension, BP  controlled.  Continue hydralazine 50 mg 3 times daily, Aldactone.. Morbid obesity, low-calorie diet, recommended  Follow-up in 8 weeks   Medication Adjustments/Labs and Tests Ordered: Current medicines are reviewed at length with the patient today.  Concerns regarding medicines are outlined above.  Orders Placed This Encounter  Procedures   Basic metabolic panel   EKG 96-EXBM    Meds ordered this encounter  Medications   Torsemide 40 MG TABS    Sig: Take 60 mg by mouth 2 (two) times daily.    Dispense:  135 tablet    Refill:  3   potassium chloride SA (KLOR-CON M) 20 MEQ tablet    Sig: Take 2 tablets (40 mEq total) by mouth 2 (two) times daily.    Dispense:  360 tablet    Refill:  1    This an increase in dose.      Patient Instructions  Medication Instructions:   Your physician has recommended you make the following change in your medication:    INCREASE your Torsemide to 60 MG twice a day.  2.    Continue with Potassium 40 MEQ twice a day.  3.    STOP taking Metolazone.  *If you need a refill on your cardiac medications before your next appointment, please call your pharmacy*   Lab Work:  ____ Please go to the [facility] after your  appointment today for a BMP lab draw.  ____ I have printed a Lab Order for a BMP to be drawn by home health in 1 week.      Follow-Up: At [facility], you and your health needs are our priority.  As part of our continuing  mission to provide you with exceptional heart care, we have created designated Provider Care Teams.  These Care Teams include your primary Cardiologist (physician) and Advanced Practice Providers (APPs -  Physician Assistants and Nurse Practitioners) who all work together to provide you with the care you need, when you need it.  We recommend signing up for the patient portal called "MyChart".  Sign up information is provided on this After Visit Summary.  MyChart is used to connect with patients for Virtual Visits (Telemedicine).  Patients are able to view lab/test results, encounter notes, upcoming appointments, etc.  Non-urgent messages can be sent to your provider as well.   To learn more about what you can do with MyChart, go to [email or web address].    Your next appointment:   2 month(s)  The format for your next appointment:   In Person  Provider:   Kate Sable, MD    Other Instructions   Important Information About Sugar         Signed, Kate Sable, MD  08/27/2022 10:11 AM    [place]

## 2022-08-27 NOTE — Patient Instructions (Signed)
Medication Instructions:   Your physician has recommended you make the following change in your medication:    INCREASE your Torsemide to 60 MG twice a day.  2.    Continue with Potassium 40 MEQ twice a day.  3.    STOP taking Metolazone.  *If you need a refill on your cardiac medications before your next appointment, please call your pharmacy*   Lab Work:  ____ Please go to the [facility] after your appointment today for a BMP lab draw.  ____ I have printed a Lab Order for a BMP to be drawn by home health in 1 week.      Follow-Up: At [facility], you and your health needs are our priority.  As part of our continuing mission to provide you with exceptional heart care, we have created designated Provider Care Teams.  These Care Teams include your primary Cardiologist (physician) and Advanced Practice Providers (APPs -  Physician Assistants and Nurse Practitioners) who all work together to provide you with the care you need, when you need it.  We recommend signing up for the patient portal called "MyChart".  Sign up information is provided on this After Visit Summary.  MyChart is used to connect with patients for Virtual Visits (Telemedicine).  Patients are able to view lab/test results, encounter notes, upcoming appointments, etc.  Non-urgent messages can be sent to your provider as well.   To learn more about what you can do with MyChart, go to [email or web address].    Your next appointment:   2 month(s)  The format for your next appointment:   In Person  Provider:   Kate Sable, MD    Other Instructions   Important Information About Sugar

## 2022-08-31 ENCOUNTER — Telehealth: Payer: Self-pay | Admitting: Cardiology

## 2022-08-31 DIAGNOSIS — I509 Heart failure, unspecified: Secondary | ICD-10-CM

## 2022-08-31 NOTE — Telephone Encounter (Signed)
Order for blood work faxed to:   [facility] [phone]

## 2022-09-01 NOTE — Telephone Encounter (Signed)
Faxed lab order as requested.

## 2022-09-02 ENCOUNTER — Telehealth: Payer: Self-pay

## 2022-09-02 ENCOUNTER — Other Ambulatory Visit: Payer: Self-pay

## 2022-09-02 MED ORDER — TORSEMIDE 20 MG PO TABS: 60.0000 mg | ORAL_TABLET | Freq: Every day | ORAL | Status: DC

## 2022-09-02 NOTE — Telephone Encounter (Signed)
Prior authorization was not needed per Renea the pharmacist at [facility].

## 2022-09-07 ENCOUNTER — Ambulatory Visit (INDEPENDENT_AMBULATORY_CARE_PROVIDER_SITE_OTHER): Payer: Medicare Other | Admitting: Family Medicine

## 2022-09-07 ENCOUNTER — Encounter: Payer: Self-pay | Admitting: Family Medicine

## 2022-09-07 VITALS — BP 110/63 | HR 80 | Temp 97.7°F | Resp 16 | Wt 222.7 lb

## 2022-09-07 DIAGNOSIS — Z23 Encounter for immunization: Secondary | ICD-10-CM | POA: Diagnosis not present

## 2022-09-07 DIAGNOSIS — R7303 Prediabetes: Secondary | ICD-10-CM

## 2022-09-07 DIAGNOSIS — N1832 Chronic kidney disease, stage 3b: Secondary | ICD-10-CM

## 2022-09-07 DIAGNOSIS — I10 Essential (primary) hypertension: Secondary | ICD-10-CM | POA: Diagnosis not present

## 2022-09-07 DIAGNOSIS — K5903 Drug induced constipation: Secondary | ICD-10-CM

## 2022-09-07 NOTE — Assessment & Plan Note (Signed)
Well controlled Continue current medications Reviewed recent metabolic panel 

## 2022-09-07 NOTE — Progress Notes (Signed)
I,Sulibeya S Dimas,acting as a Education administrator for Lavon Paganini, MD.,have documented all relevant documentation on the behalf of Lavon Paganini, MD,as directed by  Lavon Paganini, MD while in the presence of Lavon Paganini, MD.     Established patient visit   Patient: Gloria Mercer   DOB: 1940-12-02   80 y.o. Female  MRN: [id number] Visit Date: 09/07/2022  Today's healthcare provider: Lavon Paganini, MD   Chief Complaint  Patient presents with   Hypertension   Subjective    Constipation This is a recurrent problem. The current episode started more than 1 year ago. The problem has been waxing and waning since onset. The patient is not on a high fiber diet. She Does not exercise regularly. There has Been adequate water intake. Associated symptoms include abdominal pain. Risk factors include obesity and immobility. She has tried laxatives and stool softeners for the symptoms. The treatment provided mild relief.    Last BM 2 days ago.   Hypertension, follow-up  BP Readings from Last 3 Encounters:  09/07/22 110/63  08/27/22 128/68  08/12/22 110/60   Wt Readings from Last 3 Encounters:  09/07/22 222 lb 11.2 oz (101 kg)  08/27/22 219 lb 9.6 oz (99.6 kg)  08/12/22 216 lb (98 kg)     She was last seen for hypertension 8 months ago.  BP at that visit was 132/64. Management since that visit includes no changes.  She reports excellent compliance with treatment. She is not having side effects.  She is following a Regular diet. She is not exercising. She does not smoke.  Use of agents associated with hypertension: none.   Outside blood pressures are stable. Symptoms: No chest pain No chest pressure  No palpitations No syncope  Yes dyspnea No orthopnea  No paroxysmal nocturnal dyspnea Yes lower extremity edema   Pertinent labs Lab Results  Component Value Date   CHOL 198 12/25/2021   HDL 62 12/25/2021   LDLCALC 108 (H) 12/25/2021   TRIG 159 (H) 12/25/2021    CHOLHDL 3.2 12/25/2021   Lab Results  Component Value Date   NA 136 08/27/2022   K 3.9 08/27/2022   CREATININE 1.31 (H) 08/27/2022   GFRNONAA 41 (L) 08/27/2022   GLUCOSE 124 (H) 08/27/2022   TSH 3.920 03/02/2022     The ASCVD Risk score (Arnett DK, et al., 2019) failed to calculate for the following reasons:   The 2019 ASCVD risk score is only valid for ages 90 to 21  ---------------------------------------------------------------------------------------------------   Medications: Outpatient Medications Prior to Visit  Medication Sig   azelastine (ASTELIN) 0.1 % nasal spray Place 2 sprays into both nostrils daily.   Cholecalciferol (VITAMIN D3) 5000 units CAPS Take 1 capsule by mouth daily.   clotrimazole-betamethasone (LOTRISONE) cream APPLY EXTERNALLY TO THE AFFECTED AREA TWICE DAILY AS NEEDED   Cyanocobalamin 500 MCG SUBL Place under the tongue daily.   esomeprazole (NEXIUM) 20 MG capsule Take 40 mg by mouth daily at 12 noon.    ezetimibe (ZETIA) 10 MG tablet TAKE 1 TABLET(10 MG) BY MOUTH DAILY   hydrALAZINE (APRESOLINE) 50 MG tablet Take 1 tablet (50 mg total) by mouth 3 (three) times daily.   ipratropium (ATROVENT) 0.03 % nasal spray USE 2 SPRAYS IN EACH NOSTRIL EVERY 8 HOURS AS NEEDED   levocetirizine (XYZAL) 5 MG tablet Take 5 mg by mouth every evening.   montelukast (SINGULAIR) 10 MG tablet TAKE 1 TABLET(10 MG) BY MOUTH DAILY   Multiple Vitamins-Minerals (EYE VITAMINS PO) Take 2  tablets by mouth daily.    potassium chloride SA (KLOR-CON M) 20 MEQ tablet Take 2 tablets (40 mEq total) by mouth 2 (two) times daily.   pramipexole (MIRAPEX) 0.75 MG tablet TAKE 1 TABLET(0.75 MG) BY MOUTH AT BEDTIME   spironolactone (ALDACTONE) 25 MG tablet Take 1 tablet (25 mg total) by mouth daily.   Tiotropium Bromide Monohydrate (SPIRIVA RESPIMAT) 2.5 MCG/ACT AERS Inhale 2 puffs into the lungs daily.   torsemide (DEMADEX) 20 MG tablet Take 3 tablets (60 mg total) by mouth daily.    triamcinolone (NASACORT) 55 MCG/ACT AERO nasal inhaler Place 2 sprays into the nose daily.   triamcinolone ointment (KENALOG) 0.5 % Apply 1 Application topically 2 (two) times daily as needed.   No facility-administered medications prior to visit.    Review of Systems  Constitutional:  Positive for appetite change and fatigue.  Respiratory:  Positive for shortness of breath.   Cardiovascular:  Positive for leg swelling. Negative for chest pain.  Gastrointestinal:  Positive for abdominal pain and constipation. Negative for blood in stool.  Musculoskeletal:  Positive for myalgias.       Objective    BP 110/63 (BP Location: Left Arm, Patient Position: Sitting, Cuff Size: Large)   Pulse 80   Temp 97.7 F (36.5 C) (Oral)   Resp 16   Wt 222 lb 11.2 oz (101 kg)   SpO2 99%   BMI 42.08 kg/m  BP Readings from Last 3 Encounters:  09/07/22 110/63  08/27/22 128/68  08/12/22 110/60   Wt Readings from Last 3 Encounters:  09/07/22 222 lb 11.2 oz (101 kg)  08/27/22 219 lb 9.6 oz (99.6 kg)  08/12/22 216 lb (98 kg)      Physical Exam Vitals reviewed.  Constitutional:      General: She is not in acute distress.    Appearance: Normal appearance. She is well-developed. She is not diaphoretic.  HENT:     Head: Normocephalic and atraumatic.  Eyes:     General: No scleral icterus.    Conjunctiva/sclera: Conjunctivae normal.  Neck:     Thyroid: No thyromegaly.  Cardiovascular:     Rate and Rhythm: Normal rate and regular rhythm.     Pulses: Normal pulses.     Heart sounds: Normal heart sounds. No murmur heard. Pulmonary:     Effort: Pulmonary effort is normal. No respiratory distress.     Breath sounds: Normal breath sounds. No wheezing, rhonchi or rales.  Musculoskeletal:     Cervical back: Neck supple.     Right lower leg: Edema present.     Left lower leg: Edema present.  Lymphadenopathy:     Cervical: No cervical adenopathy.  Skin:    General: Skin is warm and dry.      Findings: No rash.  Neurological:     Mental Status: She is alert and oriented to person, place, and time. Mental status is at baseline.  Psychiatric:        Mood and Affect: Mood normal.        Behavior: Behavior normal.       No results found for any visits on 09/07/22.  Assessment & Plan     Problem List Items Addressed This Visit       Cardiovascular and Mediastinum   Essential hypertension - Primary    Well controlled Continue current medications Reviewed recent metabolic panel        Genitourinary   Stage 3b chronic kidney disease ([place])    Chronic  and stable Upcoming appointment with nephrology to reestablish        Other   Prediabetes    Recommend low carb diet Recheck A1c at next visit      Constipation    Chronic and currently uncontrolled She had previously done well with MiraLAX, but was hesitant to take this every day We discussed titrating miralax dose to 1 soft BM daily      Other Visit Diagnoses     Need for influenza vaccination       Relevant Orders   Flu Vaccine QUAD High Dose(Fluad) (Completed)        Return in about 3 months (around 12/08/2022) for chronic disease f/u.      I, Lavon Paganini, MD, have reviewed all documentation for this visit. The documentation on 09/07/22 for the exam, diagnosis, procedures, and orders are all accurate and complete.   Nariyah Osias, Dionne Bucy, MD, MPH [facility]

## 2022-09-07 NOTE — Assessment & Plan Note (Signed)
Chronic and currently uncontrolled She had previously done well with MiraLAX, but was hesitant to take this every day We discussed titrating miralax dose to 1 soft BM daily

## 2022-09-07 NOTE — Assessment & Plan Note (Signed)
Chronic and stable Upcoming appointment with nephrology to reestablish

## 2022-09-07 NOTE — Assessment & Plan Note (Signed)
Recommend low carb diet Recheck A1c at next visit 

## 2022-09-09 DIAGNOSIS — I509 Heart failure, unspecified: Secondary | ICD-10-CM | POA: Diagnosis not present

## 2022-09-09 DIAGNOSIS — S82891D Other fracture of right lower leg, subsequent encounter for closed fracture with routine healing: Secondary | ICD-10-CM | POA: Diagnosis not present

## 2022-09-09 DIAGNOSIS — N183 Chronic kidney disease, stage 3 unspecified: Secondary | ICD-10-CM | POA: Diagnosis not present

## 2022-09-09 DIAGNOSIS — D631 Anemia in chronic kidney disease: Secondary | ICD-10-CM | POA: Diagnosis not present

## 2022-09-09 DIAGNOSIS — I130 Hypertensive heart and chronic kidney disease with heart failure and stage 1 through stage 4 chronic kidney disease, or unspecified chronic kidney disease: Secondary | ICD-10-CM | POA: Diagnosis not present

## 2022-09-09 DIAGNOSIS — M1990 Unspecified osteoarthritis, unspecified site: Secondary | ICD-10-CM | POA: Diagnosis not present

## 2022-09-14 DIAGNOSIS — S82891D Other fracture of right lower leg, subsequent encounter for closed fracture with routine healing: Secondary | ICD-10-CM | POA: Diagnosis not present

## 2022-09-14 DIAGNOSIS — I130 Hypertensive heart and chronic kidney disease with heart failure and stage 1 through stage 4 chronic kidney disease, or unspecified chronic kidney disease: Secondary | ICD-10-CM | POA: Diagnosis not present

## 2022-09-14 DIAGNOSIS — M1990 Unspecified osteoarthritis, unspecified site: Secondary | ICD-10-CM | POA: Diagnosis not present

## 2022-09-14 DIAGNOSIS — I509 Heart failure, unspecified: Secondary | ICD-10-CM | POA: Diagnosis not present

## 2022-09-14 DIAGNOSIS — D631 Anemia in chronic kidney disease: Secondary | ICD-10-CM | POA: Diagnosis not present

## 2022-09-14 DIAGNOSIS — N183 Chronic kidney disease, stage 3 unspecified: Secondary | ICD-10-CM | POA: Diagnosis not present

## 2022-09-15 ENCOUNTER — Telehealth: Payer: Self-pay | Admitting: Cardiology

## 2022-09-15 DIAGNOSIS — E785 Hyperlipidemia, unspecified: Secondary | ICD-10-CM | POA: Diagnosis not present

## 2022-09-15 DIAGNOSIS — D631 Anemia in chronic kidney disease: Secondary | ICD-10-CM | POA: Diagnosis not present

## 2022-09-15 DIAGNOSIS — S82891D Other fracture of right lower leg, subsequent encounter for closed fracture with routine healing: Secondary | ICD-10-CM | POA: Diagnosis not present

## 2022-09-15 DIAGNOSIS — M1990 Unspecified osteoarthritis, unspecified site: Secondary | ICD-10-CM | POA: Diagnosis not present

## 2022-09-15 DIAGNOSIS — I130 Hypertensive heart and chronic kidney disease with heart failure and stage 1 through stage 4 chronic kidney disease, or unspecified chronic kidney disease: Secondary | ICD-10-CM | POA: Diagnosis not present

## 2022-09-15 DIAGNOSIS — N183 Chronic kidney disease, stage 3 unspecified: Secondary | ICD-10-CM | POA: Diagnosis not present

## 2022-09-15 DIAGNOSIS — I509 Heart failure, unspecified: Secondary | ICD-10-CM | POA: Diagnosis not present

## 2022-09-15 NOTE — Telephone Encounter (Signed)
Home health nurse is requesting call back as soon as possible to discuss how often labs are to be taken. Patient is being released from rehab today and nurse will be headed to the patients home soon. Please advise.

## 2022-09-15 NOTE — Telephone Encounter (Signed)
Called and spoke with Home Health Nurse and she stated that she drew a BMP today and results should be received soon. She is discharging patient from service later today.   I thanked her for the call, and she was appreciative for the call back.

## 2022-09-22 DIAGNOSIS — M1990 Unspecified osteoarthritis, unspecified site: Secondary | ICD-10-CM | POA: Diagnosis not present

## 2022-09-22 DIAGNOSIS — I130 Hypertensive heart and chronic kidney disease with heart failure and stage 1 through stage 4 chronic kidney disease, or unspecified chronic kidney disease: Secondary | ICD-10-CM | POA: Diagnosis not present

## 2022-09-22 DIAGNOSIS — S82891D Other fracture of right lower leg, subsequent encounter for closed fracture with routine healing: Secondary | ICD-10-CM | POA: Diagnosis not present

## 2022-09-22 DIAGNOSIS — D631 Anemia in chronic kidney disease: Secondary | ICD-10-CM | POA: Diagnosis not present

## 2022-09-22 DIAGNOSIS — N183 Chronic kidney disease, stage 3 unspecified: Secondary | ICD-10-CM | POA: Diagnosis not present

## 2022-09-22 DIAGNOSIS — I509 Heart failure, unspecified: Secondary | ICD-10-CM | POA: Diagnosis not present

## 2022-09-23 DIAGNOSIS — I509 Heart failure, unspecified: Secondary | ICD-10-CM | POA: Diagnosis not present

## 2022-09-23 DIAGNOSIS — G2581 Restless legs syndrome: Secondary | ICD-10-CM | POA: Diagnosis not present

## 2022-09-23 DIAGNOSIS — E785 Hyperlipidemia, unspecified: Secondary | ICD-10-CM | POA: Diagnosis not present

## 2022-09-23 DIAGNOSIS — Z9181 History of falling: Secondary | ICD-10-CM | POA: Diagnosis not present

## 2022-09-23 DIAGNOSIS — I130 Hypertensive heart and chronic kidney disease with heart failure and stage 1 through stage 4 chronic kidney disease, or unspecified chronic kidney disease: Secondary | ICD-10-CM | POA: Diagnosis not present

## 2022-09-23 DIAGNOSIS — M1990 Unspecified osteoarthritis, unspecified site: Secondary | ICD-10-CM | POA: Diagnosis not present

## 2022-09-23 DIAGNOSIS — G4730 Sleep apnea, unspecified: Secondary | ICD-10-CM | POA: Diagnosis not present

## 2022-09-23 DIAGNOSIS — E559 Vitamin D deficiency, unspecified: Secondary | ICD-10-CM | POA: Diagnosis not present

## 2022-09-23 DIAGNOSIS — J309 Allergic rhinitis, unspecified: Secondary | ICD-10-CM | POA: Diagnosis not present

## 2022-09-23 DIAGNOSIS — K5900 Constipation, unspecified: Secondary | ICD-10-CM | POA: Diagnosis not present

## 2022-09-23 DIAGNOSIS — D519 Vitamin B12 deficiency anemia, unspecified: Secondary | ICD-10-CM | POA: Diagnosis not present

## 2022-09-23 DIAGNOSIS — Z6841 Body Mass Index (BMI) 40.0 and over, adult: Secondary | ICD-10-CM | POA: Diagnosis not present

## 2022-09-23 DIAGNOSIS — H3530 Unspecified macular degeneration: Secondary | ICD-10-CM | POA: Diagnosis not present

## 2022-09-23 DIAGNOSIS — S82891D Other fracture of right lower leg, subsequent encounter for closed fracture with routine healing: Secondary | ICD-10-CM | POA: Diagnosis not present

## 2022-09-23 DIAGNOSIS — R7303 Prediabetes: Secondary | ICD-10-CM | POA: Diagnosis not present

## 2022-09-23 DIAGNOSIS — D631 Anemia in chronic kidney disease: Secondary | ICD-10-CM | POA: Diagnosis not present

## 2022-09-23 DIAGNOSIS — K219 Gastro-esophageal reflux disease without esophagitis: Secondary | ICD-10-CM | POA: Diagnosis not present

## 2022-09-23 DIAGNOSIS — N183 Chronic kidney disease, stage 3 unspecified: Secondary | ICD-10-CM | POA: Diagnosis not present

## 2022-09-28 ENCOUNTER — Other Ambulatory Visit: Payer: Self-pay | Admitting: Physician Assistant

## 2022-10-07 DIAGNOSIS — D631 Anemia in chronic kidney disease: Secondary | ICD-10-CM | POA: Diagnosis not present

## 2022-10-07 DIAGNOSIS — S82891D Other fracture of right lower leg, subsequent encounter for closed fracture with routine healing: Secondary | ICD-10-CM | POA: Diagnosis not present

## 2022-10-07 DIAGNOSIS — I130 Hypertensive heart and chronic kidney disease with heart failure and stage 1 through stage 4 chronic kidney disease, or unspecified chronic kidney disease: Secondary | ICD-10-CM | POA: Diagnosis not present

## 2022-10-07 DIAGNOSIS — N183 Chronic kidney disease, stage 3 unspecified: Secondary | ICD-10-CM | POA: Diagnosis not present

## 2022-10-07 DIAGNOSIS — M1990 Unspecified osteoarthritis, unspecified site: Secondary | ICD-10-CM | POA: Diagnosis not present

## 2022-10-07 DIAGNOSIS — I509 Heart failure, unspecified: Secondary | ICD-10-CM | POA: Diagnosis not present

## 2022-10-09 ENCOUNTER — Other Ambulatory Visit: Payer: Self-pay | Admitting: Physician Assistant

## 2022-10-09 DIAGNOSIS — J3089 Other allergic rhinitis: Secondary | ICD-10-CM

## 2022-10-11 DIAGNOSIS — I509 Heart failure, unspecified: Secondary | ICD-10-CM | POA: Diagnosis not present

## 2022-10-11 DIAGNOSIS — I10 Essential (primary) hypertension: Secondary | ICD-10-CM | POA: Diagnosis not present

## 2022-10-11 DIAGNOSIS — N1832 Chronic kidney disease, stage 3b: Secondary | ICD-10-CM | POA: Diagnosis not present

## 2022-10-11 DIAGNOSIS — E668 Other obesity: Secondary | ICD-10-CM | POA: Diagnosis not present

## 2022-10-12 DIAGNOSIS — S82831D Other fracture of upper and lower end of right fibula, subsequent encounter for closed fracture with routine healing: Secondary | ICD-10-CM | POA: Diagnosis not present

## 2022-10-13 ENCOUNTER — Telehealth: Payer: Self-pay | Admitting: Cardiology

## 2022-10-13 DIAGNOSIS — I509 Heart failure, unspecified: Secondary | ICD-10-CM | POA: Diagnosis not present

## 2022-10-13 DIAGNOSIS — I130 Hypertensive heart and chronic kidney disease with heart failure and stage 1 through stage 4 chronic kidney disease, or unspecified chronic kidney disease: Secondary | ICD-10-CM | POA: Diagnosis not present

## 2022-10-13 DIAGNOSIS — N183 Chronic kidney disease, stage 3 unspecified: Secondary | ICD-10-CM | POA: Diagnosis not present

## 2022-10-13 DIAGNOSIS — M1990 Unspecified osteoarthritis, unspecified site: Secondary | ICD-10-CM | POA: Diagnosis not present

## 2022-10-13 DIAGNOSIS — S82891D Other fracture of right lower leg, subsequent encounter for closed fracture with routine healing: Secondary | ICD-10-CM | POA: Diagnosis not present

## 2022-10-13 DIAGNOSIS — D631 Anemia in chronic kidney disease: Secondary | ICD-10-CM | POA: Diagnosis not present

## 2022-10-13 NOTE — Telephone Encounter (Signed)
Called patient and left a VM requesting a call back.

## 2022-10-13 NOTE — Telephone Encounter (Signed)
Pt c/o swelling: STAT is pt has developed SOB within 24 hours  If swelling, where is the swelling located?   How much weight have you gained and in what time span?   Have you gained 3 pounds in a day or 5 pounds in a week?  2-3 pounds this week  Do you have a log of your daily weights (if so, list)?  Today 224.6 lbs,  226 lbs yesterday and the day before  Are you currently taking a fluid pill?   Yes - 3 tablets 2x daily  Are you currently SOB?   No  Have you traveled recently?  No  Caller (PT therapist) stated the patient had weighed herself and she is outside of their treatment weight requirement.  Caller stated patient is having some swelling in her feet, ankle, shin area.  Caller stated can call the patient directly.

## 2022-10-14 NOTE — Telephone Encounter (Signed)
Patient was returning call. Please advise ?

## 2022-10-14 NOTE — Telephone Encounter (Signed)
Returned the call to the patient. She stated her PT had called because he was concerned about her weight gain. The patient stated that she does not have any "extra" swelling or shortness of breath. She watches her sodium intake and elevates her legs when she can. She a follow up on 11/30 and feels like she can wait until then to be seen. She feels like the PT called prematurely.   Her current weight today ws 225 lbs. Yesterday was 226 lbs.  Her urologist decreased her Hydralazine to 25 mg tid and she is taking Torsemide 60 mg bid (not once daily as her medication list states).

## 2022-10-15 NOTE — Telephone Encounter (Signed)
Noted  

## 2022-10-23 DIAGNOSIS — G4730 Sleep apnea, unspecified: Secondary | ICD-10-CM | POA: Diagnosis not present

## 2022-10-23 DIAGNOSIS — J309 Allergic rhinitis, unspecified: Secondary | ICD-10-CM | POA: Diagnosis not present

## 2022-10-23 DIAGNOSIS — K5900 Constipation, unspecified: Secondary | ICD-10-CM | POA: Diagnosis not present

## 2022-10-23 DIAGNOSIS — K219 Gastro-esophageal reflux disease without esophagitis: Secondary | ICD-10-CM | POA: Diagnosis not present

## 2022-10-23 DIAGNOSIS — M1990 Unspecified osteoarthritis, unspecified site: Secondary | ICD-10-CM | POA: Diagnosis not present

## 2022-10-23 DIAGNOSIS — Z9181 History of falling: Secondary | ICD-10-CM | POA: Diagnosis not present

## 2022-10-23 DIAGNOSIS — E559 Vitamin D deficiency, unspecified: Secondary | ICD-10-CM | POA: Diagnosis not present

## 2022-10-23 DIAGNOSIS — I509 Heart failure, unspecified: Secondary | ICD-10-CM | POA: Diagnosis not present

## 2022-10-23 DIAGNOSIS — Z6841 Body Mass Index (BMI) 40.0 and over, adult: Secondary | ICD-10-CM | POA: Diagnosis not present

## 2022-10-23 DIAGNOSIS — G2581 Restless legs syndrome: Secondary | ICD-10-CM | POA: Diagnosis not present

## 2022-10-23 DIAGNOSIS — D519 Vitamin B12 deficiency anemia, unspecified: Secondary | ICD-10-CM | POA: Diagnosis not present

## 2022-10-23 DIAGNOSIS — I130 Hypertensive heart and chronic kidney disease with heart failure and stage 1 through stage 4 chronic kidney disease, or unspecified chronic kidney disease: Secondary | ICD-10-CM | POA: Diagnosis not present

## 2022-10-23 DIAGNOSIS — R7303 Prediabetes: Secondary | ICD-10-CM | POA: Diagnosis not present

## 2022-10-23 DIAGNOSIS — N183 Chronic kidney disease, stage 3 unspecified: Secondary | ICD-10-CM | POA: Diagnosis not present

## 2022-10-23 DIAGNOSIS — E785 Hyperlipidemia, unspecified: Secondary | ICD-10-CM | POA: Diagnosis not present

## 2022-10-23 DIAGNOSIS — D631 Anemia in chronic kidney disease: Secondary | ICD-10-CM | POA: Diagnosis not present

## 2022-10-23 DIAGNOSIS — S82891D Other fracture of right lower leg, subsequent encounter for closed fracture with routine healing: Secondary | ICD-10-CM | POA: Diagnosis not present

## 2022-10-23 DIAGNOSIS — H3530 Unspecified macular degeneration: Secondary | ICD-10-CM | POA: Diagnosis not present

## 2022-10-27 ENCOUNTER — Other Ambulatory Visit: Payer: Self-pay | Admitting: Physician Assistant

## 2022-10-27 NOTE — Telephone Encounter (Signed)
Requested Prescriptions  Pending Prescriptions Disp Refills   pramipexole (MIRAPEX) 0.75 MG tablet [Pharmacy Med Name: PRAMIPEXOLE 0.'75MG'$  TABLETS] 90 tablet 0    Sig: TAKE 1 TABLET(0.75 MG) BY MOUTH AT BEDTIME     Neurology:  Parkinsonian Agents Passed - 10/27/2022  2:28 PM      Passed - Last BP in normal range    BP Readings from Last 1 Encounters:  09/07/22 110/63         Passed - Last Heart Rate in normal range    Pulse Readings from Last 1 Encounters:  09/07/22 80         Passed - Valid encounter within last 12 months    Recent Outpatient Visits           1 month ago Essential hypertension   [facility], Dionne Bucy, MD   3 months ago Closed fracture of distal end of right fibula with routine healing, unspecified fracture morphology, subsequent encounter   [facility] Thed[facility] Mins, [place], PA-C   6 months ago Constipation, unspecified constipation type   [facility], Dionne Bucy, MD   7 months ago Constipation, unspecified constipation type   [employer], Dani Gobble, PA-C   9 months ago [place], Dionne Bucy, MD       Future Appointments             In 1 month [name], Dionne Bucy, MD [facility] [place]   In 1 month Agbor-Etang, Aaron Edelman, MD [place] [place]

## 2022-10-28 ENCOUNTER — Ambulatory Visit: Payer: Medicare Other | Admitting: Cardiology

## 2022-11-02 DIAGNOSIS — I509 Heart failure, unspecified: Secondary | ICD-10-CM | POA: Diagnosis not present

## 2022-11-02 DIAGNOSIS — D631 Anemia in chronic kidney disease: Secondary | ICD-10-CM | POA: Diagnosis not present

## 2022-11-02 DIAGNOSIS — N183 Chronic kidney disease, stage 3 unspecified: Secondary | ICD-10-CM | POA: Diagnosis not present

## 2022-11-02 DIAGNOSIS — M1990 Unspecified osteoarthritis, unspecified site: Secondary | ICD-10-CM | POA: Diagnosis not present

## 2022-11-02 DIAGNOSIS — I130 Hypertensive heart and chronic kidney disease with heart failure and stage 1 through stage 4 chronic kidney disease, or unspecified chronic kidney disease: Secondary | ICD-10-CM | POA: Diagnosis not present

## 2022-11-02 DIAGNOSIS — S82891D Other fracture of right lower leg, subsequent encounter for closed fracture with routine healing: Secondary | ICD-10-CM | POA: Diagnosis not present

## 2022-11-04 ENCOUNTER — Telehealth: Payer: Self-pay | Admitting: Family Medicine

## 2022-11-04 DIAGNOSIS — E7800 Pure hypercholesterolemia, unspecified: Secondary | ICD-10-CM

## 2022-11-04 MED ORDER — EZETIMIBE 10 MG PO TABS: ORAL_TABLET | ORAL | 2 refills | Status: DC

## 2022-11-04 NOTE — Telephone Encounter (Signed)
[facility] faxed refill request for the following medications:   ezetimibe (ZETIA) 10 MG tablet    Please advise.

## 2022-11-09 DIAGNOSIS — N183 Chronic kidney disease, stage 3 unspecified: Secondary | ICD-10-CM | POA: Diagnosis not present

## 2022-11-09 DIAGNOSIS — S82891D Other fracture of right lower leg, subsequent encounter for closed fracture with routine healing: Secondary | ICD-10-CM | POA: Diagnosis not present

## 2022-11-09 DIAGNOSIS — I509 Heart failure, unspecified: Secondary | ICD-10-CM | POA: Diagnosis not present

## 2022-11-09 DIAGNOSIS — I130 Hypertensive heart and chronic kidney disease with heart failure and stage 1 through stage 4 chronic kidney disease, or unspecified chronic kidney disease: Secondary | ICD-10-CM | POA: Diagnosis not present

## 2022-11-09 DIAGNOSIS — D631 Anemia in chronic kidney disease: Secondary | ICD-10-CM | POA: Diagnosis not present

## 2022-11-09 DIAGNOSIS — M1990 Unspecified osteoarthritis, unspecified site: Secondary | ICD-10-CM | POA: Diagnosis not present

## 2022-11-17 DIAGNOSIS — D631 Anemia in chronic kidney disease: Secondary | ICD-10-CM | POA: Diagnosis not present

## 2022-11-17 DIAGNOSIS — I509 Heart failure, unspecified: Secondary | ICD-10-CM | POA: Diagnosis not present

## 2022-11-17 DIAGNOSIS — I130 Hypertensive heart and chronic kidney disease with heart failure and stage 1 through stage 4 chronic kidney disease, or unspecified chronic kidney disease: Secondary | ICD-10-CM | POA: Diagnosis not present

## 2022-11-17 DIAGNOSIS — N183 Chronic kidney disease, stage 3 unspecified: Secondary | ICD-10-CM | POA: Diagnosis not present

## 2022-11-17 DIAGNOSIS — S82891D Other fracture of right lower leg, subsequent encounter for closed fracture with routine healing: Secondary | ICD-10-CM | POA: Diagnosis not present

## 2022-11-17 DIAGNOSIS — M1990 Unspecified osteoarthritis, unspecified site: Secondary | ICD-10-CM | POA: Diagnosis not present

## 2022-11-22 DIAGNOSIS — R7303 Prediabetes: Secondary | ICD-10-CM | POA: Diagnosis not present

## 2022-11-22 DIAGNOSIS — G2581 Restless legs syndrome: Secondary | ICD-10-CM | POA: Diagnosis not present

## 2022-11-22 DIAGNOSIS — G4730 Sleep apnea, unspecified: Secondary | ICD-10-CM | POA: Diagnosis not present

## 2022-11-22 DIAGNOSIS — Z6841 Body Mass Index (BMI) 40.0 and over, adult: Secondary | ICD-10-CM | POA: Diagnosis not present

## 2022-11-22 DIAGNOSIS — E785 Hyperlipidemia, unspecified: Secondary | ICD-10-CM | POA: Diagnosis not present

## 2022-11-22 DIAGNOSIS — N183 Chronic kidney disease, stage 3 unspecified: Secondary | ICD-10-CM | POA: Diagnosis not present

## 2022-11-22 DIAGNOSIS — Z9181 History of falling: Secondary | ICD-10-CM | POA: Diagnosis not present

## 2022-11-22 DIAGNOSIS — I509 Heart failure, unspecified: Secondary | ICD-10-CM | POA: Diagnosis not present

## 2022-11-22 DIAGNOSIS — D631 Anemia in chronic kidney disease: Secondary | ICD-10-CM | POA: Diagnosis not present

## 2022-11-22 DIAGNOSIS — H3530 Unspecified macular degeneration: Secondary | ICD-10-CM | POA: Diagnosis not present

## 2022-11-22 DIAGNOSIS — M1990 Unspecified osteoarthritis, unspecified site: Secondary | ICD-10-CM | POA: Diagnosis not present

## 2022-11-22 DIAGNOSIS — K5900 Constipation, unspecified: Secondary | ICD-10-CM | POA: Diagnosis not present

## 2022-11-22 DIAGNOSIS — D519 Vitamin B12 deficiency anemia, unspecified: Secondary | ICD-10-CM | POA: Diagnosis not present

## 2022-11-22 DIAGNOSIS — E559 Vitamin D deficiency, unspecified: Secondary | ICD-10-CM | POA: Diagnosis not present

## 2022-11-22 DIAGNOSIS — S82891D Other fracture of right lower leg, subsequent encounter for closed fracture with routine healing: Secondary | ICD-10-CM | POA: Diagnosis not present

## 2022-11-22 DIAGNOSIS — J309 Allergic rhinitis, unspecified: Secondary | ICD-10-CM | POA: Diagnosis not present

## 2022-11-22 DIAGNOSIS — K219 Gastro-esophageal reflux disease without esophagitis: Secondary | ICD-10-CM | POA: Diagnosis not present

## 2022-11-22 DIAGNOSIS — I130 Hypertensive heart and chronic kidney disease with heart failure and stage 1 through stage 4 chronic kidney disease, or unspecified chronic kidney disease: Secondary | ICD-10-CM | POA: Diagnosis not present

## 2022-11-23 ENCOUNTER — Other Ambulatory Visit: Payer: Self-pay

## 2022-11-23 MED ORDER — HYDRALAZINE HCL 50 MG PO TABS: 50.0000 mg | ORAL_TABLET | Freq: Three times a day (TID) | ORAL | 0 refills | Status: DC

## 2022-12-08 DIAGNOSIS — I130 Hypertensive heart and chronic kidney disease with heart failure and stage 1 through stage 4 chronic kidney disease, or unspecified chronic kidney disease: Secondary | ICD-10-CM | POA: Diagnosis not present

## 2022-12-08 DIAGNOSIS — S82891D Other fracture of right lower leg, subsequent encounter for closed fracture with routine healing: Secondary | ICD-10-CM | POA: Diagnosis not present

## 2022-12-08 DIAGNOSIS — M1990 Unspecified osteoarthritis, unspecified site: Secondary | ICD-10-CM | POA: Diagnosis not present

## 2022-12-08 DIAGNOSIS — N183 Chronic kidney disease, stage 3 unspecified: Secondary | ICD-10-CM | POA: Diagnosis not present

## 2022-12-08 DIAGNOSIS — I509 Heart failure, unspecified: Secondary | ICD-10-CM | POA: Diagnosis not present

## 2022-12-08 DIAGNOSIS — D631 Anemia in chronic kidney disease: Secondary | ICD-10-CM | POA: Diagnosis not present

## 2022-12-09 ENCOUNTER — Telehealth: Payer: Self-pay

## 2022-12-09 ENCOUNTER — Ambulatory Visit: Payer: Medicare Other | Admitting: Family Medicine

## 2022-12-09 ENCOUNTER — Other Ambulatory Visit: Payer: Self-pay | Admitting: Physician Assistant

## 2022-12-09 DIAGNOSIS — I10 Essential (primary) hypertension: Secondary | ICD-10-CM

## 2022-12-09 DIAGNOSIS — E7800 Pure hypercholesterolemia, unspecified: Secondary | ICD-10-CM

## 2022-12-09 DIAGNOSIS — J3089 Other allergic rhinitis: Secondary | ICD-10-CM

## 2022-12-09 DIAGNOSIS — N1832 Chronic kidney disease, stage 3b: Secondary | ICD-10-CM

## 2022-12-09 DIAGNOSIS — R7303 Prediabetes: Secondary | ICD-10-CM

## 2022-12-09 NOTE — Telephone Encounter (Signed)
Due for lipids, A1c, cmp.  Ok to order and use chronic diagnoses (HTN. CKD, HLD, preDM) to associate.  Should have them done fasting at least 2 days before her appt.

## 2022-12-09 NOTE — Telephone Encounter (Signed)
Patient advised.

## 2022-12-09 NOTE — Telephone Encounter (Signed)
Copied from [place] [phone]. Topic: General - Inquiry >> Dec 09, 2022  9:50 AM Penni Bombard wrote: Pt called saying she has an appt next week and she is supposed to come in for labs a week before.  She wants to know when the order will be in the system so she can get her labs done.  CB@  [phone]

## 2022-12-14 ENCOUNTER — Ambulatory Visit: Payer: Self-pay | Admitting: *Deleted

## 2022-12-14 NOTE — Telephone Encounter (Signed)
Message from Alm Bustard sent at 12/14/2022 11:37 AM EST  Summary: constipation   Patient states that she is having issues with her constipation again and is wanting a nurse to call her.  Please advise.          Call History   Type Contact Phone/Fax User  12/14/2022 11:37 AM EST Phone (Incoming) Mercer, [place] D "Gloria" (Self) [phone] (H) Allred, Domini   Reason for Disposition  Unable to have a bowel movement (BM) without laxative or enema    This is a chronic problem.   Has an appt. In 2 days.  Answer Assessment - Initial Assessment Questions 1. STOOL PATTERN OR FREQUENCY: "How often do you have a bowel movement (BM)?"  (Normal range: 3 times a day to every 3 days)  "When was your last BM?"       Constipation it's an ongoing problem since being on this medication.   Been in ED twice to have it removed this year.   It's a serious problem for me.    I've not had a BM since Sunday night. I take something every night before I go to bed.   I took Miralax at 10:30 PM.   Still no BM.    I want to know how much medication I can take and how often.       Last week I was taking 2 Ducolax tablets.     I've only taken the Miralax last night.     I've used Dulcolax for a couple of months now but it doesn't seem to be working anymore.      I'm drinking a lot of water.    I encouraged her to walk and be active around the house.    She mentioned it's hard due to her knees but she can ambulate around.   I encouraged to walk and not sit so much as that would help with the constipation. Last BM was Sunday.     I feel miserable and bloated.   I feel like I have rocks in my behind.       2. STRAINING: "Do you have to strain to have a BM?"      Yes 3. RECTAL PAIN: "Does your rectum hurt when the stool comes out?" If Yes, ask: "Do you have hemorrhoids? How bad is the pain?"  (Scale 1-10; or mild, moderate, severe)     I feel like I have rocks in my behind. 4. STOOL COMPOSITION: "Are the  stools hard?"      I haven't had a BM since Sunday. 5. BLOOD ON STOOLS: "Has there been any blood on the toilet tissue or on the surface of the BM?" If Yes, ask: "When was the last time?"     No 6. CHRONIC CONSTIPATION: "Is this a new problem for you?"  If No, ask: "How long have you had this problem?" (days, weeks, months)      Yes.   I've had this problem a long time.   I've been to the ED twice in the last year to have it removed.    7. CHANGES IN DIET OR HYDRATION: "Have there been any recent changes in your diet?" "How much fluids are you drinking on a daily basis?"  "How much have you had to drink today?"     No    I'm drinking plenty of water because I know that's important for constipation. 8. MEDICINES: "Have you been taking any new medicines?" "Are you  taking any narcotic pain medicines?" (e.g., Dilaudid, morphine, Percocet, Vicodin)     No   Denies being on any narcotics/opioid medications. 9. LAXATIVES: "Have you been using any stool softeners, laxatives, or enemas?"  If Yes, ask "What, how often, and when was the last time?"     Miralax last night. 10. ACTIVITY:  "How much walking do you do every day?"  "Has your activity level decreased in the past week?"        Not much so encouraged to ambulate around the house since the weather is cold and rainy today.  11. CAUSE: "What do you think is causing the constipation?"        The medications I'm on 12. OTHER SYMPTOMS: "Do you have any other symptoms?" (e.g., abdomen pain, bloating, fever, vomiting)       I'm bloated and miserable feeling.   13. MEDICAL HISTORY: "Do you have a history of hemorrhoids, rectal fissures, or rectal surgery or rectal abscess?"         Not asked 14. PREGNANCY: "Is there any chance you are pregnant?" "When was your last menstrual period?"       N/A due to age  Protocols used: Constipation-A-AH

## 2022-12-14 NOTE — Telephone Encounter (Signed)
  Chief Complaint: chronic constipation Symptoms: Last BM was Sunday.   Bloated and "feels like she has rocks in her behind" Frequency: Would like to have a daily BM however constipation has been a chronic problem for her. Pertinent Negatives: Patient denies Blood  Disposition: '[]'$ ED /'[]'$ Urgent Care (no appt availability in office) / '[x]'$ Appointment(In office/virtual)/ '[]'$  [facility]/ '[]'$ Home Care/ '[]'$ Refused Recommended Disposition /'[]'$ [facility]/ '[]'$  Follow-up with PCP Additional Notes: She has an appt. Set up with Dr. Brita Romp on 12/16/2022 so I encouraged her to check the Miralax container and see how often she could use it and to follow the package instructions.   She took Miralax last night.   I encouraged her to be sure and come to her appt. On 12/16/2022.

## 2022-12-15 ENCOUNTER — Ambulatory Visit: Payer: Self-pay

## 2022-12-15 NOTE — Telephone Encounter (Signed)
Summary: More questions for nurse, follow up from clinical call yesterday   Pt called and says she has follow up questions from clinical call yesterday       Pt. States during an ED visit one time she was given this remedy for constipation - 2 Dulcolax pills, 280 grams of Miralax with clear liquids. Please advise pt.

## 2022-12-16 ENCOUNTER — Emergency Department: Payer: Medicare Other

## 2022-12-16 ENCOUNTER — Ambulatory Visit: Payer: Medicare Other | Admitting: Family Medicine

## 2022-12-16 ENCOUNTER — Emergency Department
Admission: EM | Admit: 2022-12-16 | Discharge: 2022-12-16 | Disposition: A | Discharge status: Home / Self Care | Payer: Medicare Other | Attending: Emergency Medicine | Admitting: Emergency Medicine

## 2022-12-16 ENCOUNTER — Other Ambulatory Visit: Payer: Self-pay

## 2022-12-16 DIAGNOSIS — N1832 Chronic kidney disease, stage 3b: Secondary | ICD-10-CM | POA: Insufficient documentation

## 2022-12-16 DIAGNOSIS — I509 Heart failure, unspecified: Secondary | ICD-10-CM | POA: Insufficient documentation

## 2022-12-16 DIAGNOSIS — Z79899 Other long term (current) drug therapy: Secondary | ICD-10-CM | POA: Diagnosis not present

## 2022-12-16 DIAGNOSIS — K5900 Constipation, unspecified: Secondary | ICD-10-CM

## 2022-12-16 DIAGNOSIS — G4730 Sleep apnea, unspecified: Secondary | ICD-10-CM | POA: Insufficient documentation

## 2022-12-16 DIAGNOSIS — I130 Hypertensive heart and chronic kidney disease with heart failure and stage 1 through stage 4 chronic kidney disease, or unspecified chronic kidney disease: Secondary | ICD-10-CM | POA: Diagnosis not present

## 2022-12-16 DIAGNOSIS — I700 Atherosclerosis of aorta: Secondary | ICD-10-CM | POA: Diagnosis not present

## 2022-12-16 MED ORDER — MILK AND MOLASSES ENEMA
1.0000 | Freq: Once | RECTAL | Status: AC
  Administered 2022-12-16: 240 mL via RECTAL
  Filled 2022-12-16 (×2): qty 240

## 2022-12-16 MED ORDER — LACTULOSE 10 GM/15ML PO SOLN
30.0000 g | Freq: Once | ORAL | Status: AC
  Administered 2022-12-16: 30 g via ORAL
  Filled 2022-12-16: qty 60

## 2022-12-16 NOTE — ED Notes (Signed)
Pt assisted with going to the toilet and having a BM after enema

## 2022-12-16 NOTE — ED Provider Notes (Signed)
[facility] Provider Note    Event Date/Time   First MD Initiated Contact with Patient 12/16/22 1014     (approximate)   History   Constipation   HPI  Gloria Mercer is a 82 y.o. female past medical history of CHF hypertension hyperlipidemia osteoarthritis and constipation who presents with constipation.  Patient has not had a bowel meant for 4 days.  Has had some loose stool this morning but no significant BM.  She takes 2 Dulcolax daily has been doing this but it stopped working.  Then started taking MiraLAX for the last couple days and has had 2 capsules/day.  Still eating and drinking does have some associated abdominal cramping and bloating.  No vomiting felt sick on her stomach this morning but no pain currently.  No fevers chills.  No opiates.  Has required 2 manual disimpaction's in the ED in the past year.     Past Medical History:  Diagnosis Date   Allergic rhinitis    on allergy shots, sees Dr. Pryor Ochoa   Anemia    Arthritis    B12 deficiency    Back pain    Bilateral edema of lower extremity    Cataract    CHF (congestive heart failure) (HCC)    Constipation    Edema    Gallbladder problem    GERD (gastroesophageal reflux disease)    History of stomach ulcers    Hyperlipidemia    Hypertension    Joint pain    Macular degeneration    followed by Dr Garwin Brothers at [place]    Other fatigue    Prediabetes    Proteinuria    RLS (restless legs syndrome)    Sciatica    Shortness of breath    Shortness of breath on exertion    Sleep apnea    Stage 3 chronic kidney disease ([place])    Swallowing difficulty    Vitamin D deficiency     Patient Active Problem List   Diagnosis Date Noted   Closed fracture of distal end of right fibula with routine healing 07/22/2022   Pancreatic lesion 04/02/2022   Spinal stenosis of lumbar region without neurogenic claudication 04/02/2022   Multiple pulmonary nodules 04/02/2022   Compression  fracture of lumbar vertebra with routine healing 04/02/2022   Cervical lymphadenopathy 04/02/2022   Pseudophakia of right eye 09/07/2021   Preoperative evaluation of a medical condition to rule out surgical contraindications (TAR required) 09/02/2021   Sleep apnea 09/02/2021   CKD (chronic kidney disease) stage 3, GFR 30-59 ml/min (HCC) 09/02/2021   Edema, unspecified 06/25/2021   Heart failure, unspecified ([place]) 06/25/2021   Proteinuria, unspecified 06/25/2021   Stage 3b chronic kidney disease ([place]) 04/24/2021   Chronic instability of right knee 06/24/2020   Senile purpura ([place]) 05/06/2020   Chronic pain of right knee 05/05/2020   Chronic back pain 07/09/2018   Constipation 01/12/2018   RLS (restless legs syndrome) 08/29/2017   Pedal edema 08/29/2017   Essential hypertension    Primary osteoarthritis of right knee    Gastroesophageal reflux disease without esophagitis    Cataract    Macular degeneration    Allergic rhinitis    Hypertensive heart disease without heart failure 07/18/2017   Snoring 07/18/2017   Prediabetes 07/05/2017   Colon polyps 01/12/2017   Diverticulosis of large intestine without hemorrhage 01/12/2017   External hemorrhoids 01/12/2017   Gastric nodule 01/12/2017   Hiatal hernia 01/12/2017   Internal hemorrhoids  01/12/2017   Lipoma of colon 01/12/2017   Multiple gastric polyps 01/12/2017   Schatzki's ring of distal esophagus 01/12/2017   Bloating 12/23/2016   Dysphagia 12/23/2016   Epigastric pain 12/23/2016   History of colonic polyps 12/23/2016   Rectal bleeding 12/23/2016   Dysphagia 12/23/2016   Chronic renal impairment, stage 3 (moderate) ([place]) 04/02/2016   Age-related nuclear cataract of both eyes 10/31/2015   Exudative age-related macular degeneration of right eye ([place]) 10/31/2015   Nuclear sclerotic cataract of both eyes 10/31/2015   Nonexudative age-related macular degeneration, left eye, advanced atrophic with subfoveal involvement 10/31/2015    Bronchitis, mucopurulent recurrent ([place]) 03/31/2015   Age-related macular degeneration, dry, left eye 02/18/2015   Age-related macular degeneration, wet, right eye ([place]) 02/18/2015   Morbid obesity ([place]) 76/16/0737   Diastolic dysfunction 10/62/6948   Intertriginous candidiasis 06/16/2013   Other and unspecified hyperlipidemia 10/19/2012   PA (pernicious anemia) 10/19/2012   Vitamin D deficiency 10/19/2012     Physical Exam  Triage Vital Signs: ED Triage Vitals  Enc Vitals Group     BP 12/16/22 1021 (!) 147/67     Pulse Rate 12/16/22 1021 95     Resp 12/16/22 1021 18     Temp 12/16/22 1021 98.2 F (36.8 C)     Temp Source 12/16/22 1021 Oral     SpO2 12/16/22 1021 99 %     Weight 12/16/22 1011 222 lb 10.6 oz (101 kg)     Height 12/16/22 1011 '5\' 1"'$  (1.549 m)     Head Circumference --      Peak Flow --      Pain Score --      Pain Loc --      Pain Edu? --      Excl. in [place]? --     Most recent vital signs: Vitals:   12/16/22 1230 12/16/22 1300  BP: 129/68 (!) 141/72  Pulse: 85 78  Resp: (!) 24 (!) 25  Temp:    SpO2: 99% 99%     General: Awake, no distress.  CV:  Good peripheral perfusion.  Resp:  Normal effort.  Abd:  Abdomen is mildly tender in lower quadrants but no guarding, soft mildly distended Neuro:             Awake, Alert, Oriented x 3  Other:  Stool ball in the rectal vault is soft   ED Results / Procedures / Treatments  Labs (all labs ordered are listed, but only abnormal results are displayed) Labs Reviewed - No data to display   EKG     RADIOLOGY Reviewed interpreted the CT of the abdomen pelvis which shows a large stool ball but no obstruction   PROCEDURES:  Critical Care performed: No  .1-3 Lead EKG Interpretation  Performed by: Rada Hay, MD Authorized by: Rada Hay, MD     Interpretation: normal     ECG rate assessment: normal     Rhythm: sinus rhythm     Ectopy: none     Conduction: normal     The  patient is on the cardiac monitor to evaluate for evidence of arrhythmia and/or significant heart rate changes.   MEDICATIONS ORDERED IN ED: Medications  milk and molasses enema (has no administration in time range)  lactulose (CHRONULAC) 10 GM/15ML solution 30 g (30 g Oral Given 12/16/22 1109)     IMPRESSION / MDM / ASSESSMENT AND PLAN / ED COURSE  I reviewed the triage vital signs and  the nursing notes.                              Patient's presentation is most consistent with acute complicated illness / injury requiring diagnostic workup.  Differential diagnosis includes, but is not limited to, acute on chronic constipation, fecal impaction, bowel obstruction  The patient is an 82 year old female with history of constipation who presents with constipation.  Has not had a bowel movement in the last 4 days despite taking MiraLAX and Dulcolax.  No significant abdominal pain but does have some cramping and distention.  No vomiting.  Has required 2 manual disimpaction's in the last year in the ED.  On exam she has mild abdominal distention with a little bit of tenderness in the lower quadrants overall her abdominal exam is reassuring.  There is a stool ball in the rectal vault that is overall soft.  Will obtain a CT of the abdomen pelvis without contrast to rule out obstruction or other process that could be contributing to the constipation.    CT abdomen pelvis shows constipation with stool ball but no obstruction.  Patient given milk of molasses enema and had large bowel movement.  I have instructed her to increase the dose of MiraLAX to 2 capfuls twice a day until she is having loose stool and then she can cut back.  Also recommended staying hydrated and fiber.   FINAL CLINICAL IMPRESSION(S) / ED DIAGNOSES   Final diagnoses:  Constipation, unspecified constipation type     Rx / DC Orders   ED Discharge Orders     None        Note:  This document was prepared using Dragon  voice recognition software and may include unintentional dictation errors.   Rada Hay, MD 12/16/22 [phone]

## 2022-12-16 NOTE — ED Notes (Addendum)
Pt attempted to have BM on toilet with no success- enema still not available from pharmacy

## 2022-12-16 NOTE — ED Notes (Signed)
Enema not yet available from pharmacy

## 2022-12-16 NOTE — Telephone Encounter (Signed)
Left detailed message on voice mail advising as below.

## 2022-12-16 NOTE — ED Triage Notes (Signed)
C/O constipation.  Last BM 12/12/2021  AAOx3.  Skin warm and dry. NAD

## 2022-12-16 NOTE — Discharge Instructions (Signed)
Increase the dose of MiraLAX to 2 capfuls twice a day.  If you start having loose stool or diarrhea cut back to 1 capful once a day.

## 2022-12-16 NOTE — Telephone Encounter (Signed)
Yes that is fine for her to take.

## 2022-12-16 NOTE — ED Notes (Signed)
Called pharmacy who states they will send enema in approximately 3 minutes

## 2022-12-17 ENCOUNTER — Ambulatory Visit: Payer: Medicare Other | Admitting: Cardiology

## 2022-12-20 ENCOUNTER — Telehealth: Payer: Self-pay

## 2022-12-20 ENCOUNTER — Other Ambulatory Visit: Payer: Self-pay

## 2022-12-20 DIAGNOSIS — I10 Essential (primary) hypertension: Secondary | ICD-10-CM

## 2022-12-20 DIAGNOSIS — N1832 Chronic kidney disease, stage 3b: Secondary | ICD-10-CM

## 2022-12-20 DIAGNOSIS — E7800 Pure hypercholesterolemia, unspecified: Secondary | ICD-10-CM

## 2022-12-21 ENCOUNTER — Telehealth: Payer: Self-pay | Admitting: *Deleted

## 2022-12-21 ENCOUNTER — Telehealth: Payer: Self-pay

## 2022-12-21 NOTE — Progress Notes (Signed)
Care Management & Coordination Services Pharmacy Assistant   Name: AKANKSHA BELLMORE  MRN: [id number] DOB: 09-Sep-1941  Conditions to be addressed/monitored: HTN, CKD Stage 3, Allergic Rhinitis, Osteoarthritis, and Hypertensive heart disease without heart failure,   Reason for Encounter: Appointment Reminder  Chart review:  Recent office visits:  09/07/2022 Lavon Paganini, MD (PCP Office Visit) No medication changes noted, No orders placed, patient to follow-up in 3 months  07/22/2022  [name], PA-C (PCP Video Visit) No medication changes noted, Referral to [place], Referral to Podiatry, No follow-up noted  Recent consult visits:  10/12/2022 Cherrie Gauze, DPM (Podiatry) for Follow-up- No medication changes needed, No orders placed, patient to follow-up prn  10/11/2022  Gwen Her. Hyler AGNP (Nephrology) for Follow-up- Changed: Decrease Hydralazine 25 mg three times daily, No orders placed, Patient to follow-up in 6 months  08/27/2022 Brain Garen Lah, MD (Cardiology) for Follow-up- Changed: Torsemide 60 mg twice daily from 40 mg daily, Stopped: Metolazone 2.5 mg due to patient not taking, Lab orders placed,   08/24/2022 Cherrie Gauze, DMP (Podiatry) for Follow-up- I am unable to view this note  08/03/2022  Cherrie Gauze, DMP (Podiatry) for Initial Consult- I am unable to view this note  07/16/2022 Roxanna Mew, [place] (Podiatry) for Initial Consult- No medications changes noted, No orders placed, Referral for Podiatry placed,   07/02/2022 Brain Garen Lah, MD (Cardiology) for Follow-up- Started: Metolazone 5 mg daily, Take 30 mins prior to your Torsemide, Changed: Triamcinolone Acetonide  55 MCG/ACT Aero, 2 sprays Nasal Daily   (Change in therapy) 0.5 % Oint, 1 Application Topical 2 times daily PRN Stopped: Duloxetine 30 mg, Lab orders placed, BP: 158/65, Patient to follow-up in 6-8 weeks  Hospital visits:  Medication Reconciliation was completed by  comparing discharge summary, patient's EMR and Pharmacy list, and upon discussion with patient.  Admitted to the hospital on 12/16/2022 due to Constipation. Discharge date was 12/16/2022. Discharged from [facility] Emergency Department at [place]?Medications Started at [facility] Discharge:?? -started None ID  Medication Changes at Hospital Discharge: -Changed None ID  Medications Discontinued at Hospital Discharge: -Stopped None ID   Medications that remain the same after Hospital Discharge:??  -All other medications will remain the same.  Medication Reconciliation was completed by comparing discharge summary, patient's EMR and Pharmacy list, and upon discussion with patient.  Admitted to the hospital on 08/12/2022 due to Constipation. Discharge date was 08/12/2022. Discharged from [facility] Emergency Department at [place]?Medications Started at [facility] Discharge:?? -started None ID  Medication Changes at Hospital Discharge: -Changed None ID  Medications Discontinued at Hospital Discharge: -Stopped None ID   Medications that remain the same after Hospital Discharge:??  -All other medications will remain the same.    Medications: Outpatient Encounter Medications as of 12/21/2022  Medication Sig   azelastine (ASTELIN) 0.1 % nasal spray USE 2 SPRAYS IN EACH NOSTRIL DAILY   Cholecalciferol (VITAMIN D3) 5000 units CAPS Take 1 capsule by mouth daily.   clotrimazole-betamethasone (LOTRISONE) cream APPLY EXTERNALLY TO THE AFFECTED AREA TWICE DAILY AS NEEDED   Cyanocobalamin 500 MCG SUBL Place under the tongue daily.   esomeprazole (NEXIUM) 20 MG capsule Take 40 mg by mouth daily at 12 noon.    ezetimibe (ZETIA) 10 MG tablet TAKE 1 TABLET(10 MG) BY MOUTH DAILY   hydrALAZINE (APRESOLINE) 50 MG tablet Take 1 tablet (50 mg total) by mouth 3 (three) times daily.   ipratropium (ATROVENT)  0.03 % nasal spray USE 2 SPRAYS IN EACH NOSTRIL EVERY 8 HOURS AS  NEEDED   levocetirizine (XYZAL) 5 MG tablet Take 5 mg by mouth every evening.   montelukast (SINGULAIR) 10 MG tablet TAKE 1 TABLET(10 MG) BY MOUTH DAILY   Multiple Vitamins-Minerals (EYE VITAMINS PO) Take 2 tablets by mouth daily.    potassium chloride SA (KLOR-CON M) 20 MEQ tablet Take 2 tablets (40 mEq total) by mouth 2 (two) times daily.   pramipexole (MIRAPEX) 0.75 MG tablet TAKE 1 TABLET(0.75 MG) BY MOUTH AT BEDTIME   spironolactone (ALDACTONE) 25 MG tablet Take 1 tablet (25 mg total) by mouth daily.   Tiotropium Bromide Monohydrate (SPIRIVA RESPIMAT) 2.5 MCG/ACT AERS Inhale 2 puffs into the lungs daily.   torsemide (DEMADEX) 20 MG tablet Take 3 tablets (60 mg total) by mouth daily.   triamcinolone (NASACORT) 55 MCG/ACT AERO nasal inhaler Place 2 sprays into the nose daily.   triamcinolone ointment (KENALOG) 0.5 % Apply 1 Application topically 2 (two) times daily as needed.   No facility-administered encounter medications on file as of 12/21/2022.   Lab Results  Component Value Date/Time   HGBA1C 5.9 (H) 12/25/2021 10:59 AM   HGBA1C 5.6 09/29/2020 11:35 AM   MICROALBUR <3.0 03/26/2016 12:00 AM    BP Readings from Last 3 Encounters:  12/16/22 (!) 98/58  09/07/22 110/63  08/27/22 128/68   Contacted patient to confirm telephone appointment with Junius Argyle, CPP PharmD, on 12/27/2022 at 1100. Unsuccessful outreach. Left voicemail for patient to return call.   Care Gaps: Annual wellness visit in last year? Yes  Lynann Bologna, CPA/CMA Clinical Pharmacist Assistant Phone: [phone]

## 2022-12-21 NOTE — Patient Outreach (Signed)
  Care Coordination [facility] Note Transition Care Management Follow-up Telephone Call Date of discharge and from where: [facility] EM[facility] ED 12/16/2022 How have you been since you were released from the hospital? Doing good Any questions or concerns? No  Items Reviewed: Did the pt receive and understand the discharge instructions provided? Yes  Medications obtained and verified? Yes  Other? No  Any new allergies since your discharge? No  Dietary orders reviewed? No Do you have support at home? Yes   Home Care and Equipment/Supplies: Were home health services ordered? not applicable If so, what is the name of the agency? n  Has the agency set up a time to come to the patient's home? no Were any new equipment or medical supplies ordered?  No What is the name of the medical supply agency? N/a Were you able to get the supplies/equipment? not applicable Do you have any questions related to the use of the equipment or supplies? No  Functional Questionnaire: (I = Independent and D = Dependent) ADLs: I  Bathing/Dressing- I  Meal Prep- I  Eating- I  Maintaining continence- I  Transferring/Ambulation- I  Managing Meds- I  Follow up appointments reviewed:  PCP Hospital f/u appt confirmed? Yes  Scheduled to see Dr Brita Romp  12/28/2022 2:20 PM. [facility] f/u appt confirmed? Yes  Scheduled to see Dr Michaelle Birks 12/27/2022 11:00 . Are transportation arrangements needed? No  If their condition worsens, is the pt aware to call PCP or go to the Emergency Dept.? Yes Was the patient provided with contact information for the PCP's office or ED? Yes Was to pt encouraged to call back with questions or concerns? Yes  SDOH assessments and interventions completed:   Yes SDOH Interventions Today    Flowsheet Row Most Recent Value  SDOH Interventions   Food Insecurity Interventions Intervention Not Indicated  Housing Interventions Intervention Not Indicated  Transportation Interventions  Intervention Not Indicated       Care Coordination Interventions:  No Care Coordination interventions needed at this time.   Encounter Outcome:  Pt. Visit Completed    [place] Management [phone]

## 2022-12-21 NOTE — Patient Outreach (Addendum)
  Care Coordination [facility] Note Transition Care Management Unsuccessful Follow-up Telephone Call  Late entry, call attempted 12/20/22  Date of discharge and from where:  [facility]- 12/16/22  Attempts:  1st Attempt  Reason for unsuccessful TCM follow-up call:  No answer/busy  Johnney Killian, RN, BSN, CCM Care Management Coordinator [facility] Phone: [phone]: [phone]

## 2022-12-22 DIAGNOSIS — J309 Allergic rhinitis, unspecified: Secondary | ICD-10-CM | POA: Diagnosis not present

## 2022-12-22 DIAGNOSIS — Z6841 Body Mass Index (BMI) 40.0 and over, adult: Secondary | ICD-10-CM | POA: Diagnosis not present

## 2022-12-22 DIAGNOSIS — E785 Hyperlipidemia, unspecified: Secondary | ICD-10-CM | POA: Diagnosis not present

## 2022-12-22 DIAGNOSIS — K219 Gastro-esophageal reflux disease without esophagitis: Secondary | ICD-10-CM | POA: Diagnosis not present

## 2022-12-22 DIAGNOSIS — K5900 Constipation, unspecified: Secondary | ICD-10-CM | POA: Diagnosis not present

## 2022-12-22 DIAGNOSIS — I509 Heart failure, unspecified: Secondary | ICD-10-CM | POA: Diagnosis not present

## 2022-12-22 DIAGNOSIS — S82891D Other fracture of right lower leg, subsequent encounter for closed fracture with routine healing: Secondary | ICD-10-CM | POA: Diagnosis not present

## 2022-12-22 DIAGNOSIS — R7303 Prediabetes: Secondary | ICD-10-CM | POA: Diagnosis not present

## 2022-12-22 DIAGNOSIS — G4730 Sleep apnea, unspecified: Secondary | ICD-10-CM | POA: Diagnosis not present

## 2022-12-22 DIAGNOSIS — M1990 Unspecified osteoarthritis, unspecified site: Secondary | ICD-10-CM | POA: Diagnosis not present

## 2022-12-22 DIAGNOSIS — D519 Vitamin B12 deficiency anemia, unspecified: Secondary | ICD-10-CM | POA: Diagnosis not present

## 2022-12-22 DIAGNOSIS — N183 Chronic kidney disease, stage 3 unspecified: Secondary | ICD-10-CM | POA: Diagnosis not present

## 2022-12-22 DIAGNOSIS — Z9181 History of falling: Secondary | ICD-10-CM | POA: Diagnosis not present

## 2022-12-22 DIAGNOSIS — E559 Vitamin D deficiency, unspecified: Secondary | ICD-10-CM | POA: Diagnosis not present

## 2022-12-22 DIAGNOSIS — G2581 Restless legs syndrome: Secondary | ICD-10-CM | POA: Diagnosis not present

## 2022-12-22 DIAGNOSIS — I130 Hypertensive heart and chronic kidney disease with heart failure and stage 1 through stage 4 chronic kidney disease, or unspecified chronic kidney disease: Secondary | ICD-10-CM | POA: Diagnosis not present

## 2022-12-22 DIAGNOSIS — H3530 Unspecified macular degeneration: Secondary | ICD-10-CM | POA: Diagnosis not present

## 2022-12-22 DIAGNOSIS — D631 Anemia in chronic kidney disease: Secondary | ICD-10-CM | POA: Diagnosis not present

## 2022-12-23 DIAGNOSIS — I130 Hypertensive heart and chronic kidney disease with heart failure and stage 1 through stage 4 chronic kidney disease, or unspecified chronic kidney disease: Secondary | ICD-10-CM | POA: Diagnosis not present

## 2022-12-23 DIAGNOSIS — I509 Heart failure, unspecified: Secondary | ICD-10-CM | POA: Diagnosis not present

## 2022-12-23 DIAGNOSIS — N183 Chronic kidney disease, stage 3 unspecified: Secondary | ICD-10-CM | POA: Diagnosis not present

## 2022-12-23 DIAGNOSIS — M1990 Unspecified osteoarthritis, unspecified site: Secondary | ICD-10-CM | POA: Diagnosis not present

## 2022-12-23 DIAGNOSIS — D631 Anemia in chronic kidney disease: Secondary | ICD-10-CM | POA: Diagnosis not present

## 2022-12-23 DIAGNOSIS — S82891D Other fracture of right lower leg, subsequent encounter for closed fracture with routine healing: Secondary | ICD-10-CM | POA: Diagnosis not present

## 2022-12-27 ENCOUNTER — Telehealth: Payer: Self-pay | Admitting: Cardiology

## 2022-12-27 ENCOUNTER — Other Ambulatory Visit: Payer: Self-pay | Admitting: Family Medicine

## 2022-12-27 ENCOUNTER — Ambulatory Visit: Payer: Medicare Other

## 2022-12-27 DIAGNOSIS — J411 Mucopurulent chronic bronchitis: Secondary | ICD-10-CM

## 2022-12-27 DIAGNOSIS — I5032 Chronic diastolic (congestive) heart failure: Secondary | ICD-10-CM

## 2022-12-27 DIAGNOSIS — K5903 Drug induced constipation: Secondary | ICD-10-CM

## 2022-12-27 MED ORDER — POTASSIUM CHLORIDE CRYS ER 20 MEQ PO TBCR: 40.0000 meq | EXTENDED_RELEASE_TABLET | Freq: Two times a day (BID) | ORAL | 0 refills | Status: DC

## 2022-12-27 NOTE — Telephone Encounter (Signed)
*  STAT* If patient is at the pharmacy, call can be transferred to refill team.   1. Which medications need to be refilled? (please list name of each medication and dose if known) potassium chloride SA (KLOR-CON M) 20 MEQ tablet   2. Which pharmacy/location (including street and city if local pharmacy) is medication to be sent to?  [facility] [place], [place]    3. Do they need a 30 day or 90 day supply? [place]

## 2022-12-27 NOTE — Progress Notes (Signed)
Care Management & Coordination Services Pharmacy Note  12/27/2022 Name:  Gloria Mercer MRN:  [id number] DOB:  [birth year]  Summary: Patient presents for initial CCM consult.   -Patient cholesterol uncontrolled. However given patient age and polypharmacy concerns, may be reasonable to discontinue cholesterol treatment at this time.   -Patient reports uncontrolled constipations resulting in multiple ED visits. She has started dulcolax and Miralax and now her symptoms have transitioned to diarrhea. Symptoms may be related to polypharmacy concerns.   -Previously having symptoms of shortness of breath and wheezing with congestion. Symptoms better controlled with Spiriva, but reports her inhaler runs out early.   Recommendations/Changes made from today's visit: -DECREASE Dulcolax to 5 mg nightly -DECREASE Miralax to 1 capful daily as needed.  -Recommend switching pramipexole to ropinirole 0.25 mg nightly and consider discontinuing ezetimibe to minimize risk of drug-induced constipation.   -Recommend PFT testing.  Follow up plan: CPP follow-up 6 months   Subjective: KHYA HALLS is an 82 y.o. year old female who is a primary patient of [name], Dionne Bucy, MD.  The care coordination team was consulted for assistance with disease management and care coordination needs.    Engaged with patient by telephone for initial visit.  Recent office visits: 09/07/2022 Lavon Paganini, MD (PCP Office Visit) No medication changes noted, No orders placed, patient to follow-up in 3 months   07/22/2022  [name], PA-C (PCP Video Visit) No medication changes noted, Referral to [place], Referral to Podiatry, No follow-up noted  Recent consult visits: 10/12/2022 Cherrie Gauze, DPM (Podiatry) for Follow-up- No medication changes needed, No orders placed, patient to follow-up prn   10/11/2022  Gwen Her. Hyler AGNP (Nephrology) for Follow-up- Changed: Decrease Hydralazine 25 mg three  times daily, No orders placed, Patient to follow-up in 6 months   08/27/2022 Brain Garen Lah, MD (Cardiology) for Follow-up- Changed: Torsemide 60 mg twice daily from 40 mg daily, Stopped: Metolazone 2.5 mg due to patient not taking, Lab orders placed,    08/24/2022 Cherrie Gauze, DMP (Podiatry) for Follow-up- I am unable to view this note   08/03/2022  Cherrie Gauze, DMP (Podiatry) for Initial Consult- I am unable to view this note   07/16/2022 Roxanna Mew, [place] (Podiatry) for Initial Consult- No medications changes noted, No orders placed, Referral for Podiatry placed,    07/02/2022 Brain Garen Lah, MD (Cardiology) for Follow-up- Started: Metolazone 5 mg daily, Take 30 mins prior to your Torsemide, Changed: Triamcinolone Acetonide  55 MCG/ACT Aero, 2 sprays Nasal Daily   (Change in therapy) 0.5 % Oint, 1 Application Topical 2 times daily PRN Stopped: Duloxetine 30 mg, Lab orders placed, BP: 158/65, Patient to follow-up in 6-8 weeks  Hospital visits: Admitted to the hospital on 12/16/2022 due to Constipation. Discharge date was 12/16/2022. Discharged from [facility] Emergency Department at [facility].     Admitted to the hospital on 08/12/2022 due to Constipation. Discharge date was 08/12/2022. Discharged from [facility] Emergency Department at [facility].   Objective:  Lab Results  Component Value Date   CREATININE 1.31 (H) 08/27/2022   BUN 30 (H) 08/27/2022   EGFR 49 (L) 03/02/2022   GFRNONAA 41 (L) 08/27/2022   GFRAA 64 09/29/2020   NA 136 08/27/2022   K 3.9 08/27/2022   CALCIUM 9.5 08/27/2022   CO2 27 08/27/2022   GLUCOSE 124 (H) 08/27/2022    Lab Results  Component Value Date/Time   HGBA1C 5.9 (H) 12/25/2021 10:59 AM  HGBA1C 5.6 09/29/2020 11:35 AM   MICROALBUR <3.0 03/26/2016 12:00 AM    Last diabetic Eye exam: No results found for: "HMDIABEYEEXA"  Last diabetic Foot exam: No results found for: "HMDIABFOOTEX"   Lab Results   Component Value Date   CHOL 198 12/25/2021   HDL 62 12/25/2021   LDLCALC 108 (H) 12/25/2021   TRIG 159 (H) 12/25/2021   CHOLHDL 3.2 12/25/2021       Latest Ref Rng & Units 08/12/2022   10:37 AM 03/18/2022    9:06 AM 03/02/2022   10:20 AM  Hepatic Function  Total Protein 6.5 - 8.1 g/dL 6.9  6.4  5.8   Albumin 3.5 - 5.0 g/dL 3.5  3.4  3.9   AST 15 - 41 U/L '20  18  12   '$ ALT 0 - 44 U/L '12  14  11   '$ Alk Phosphatase 38 - 126 U/L 91  89  104   Total Bilirubin 0.3 - 1.2 mg/dL 1.2  0.9  0.4     Lab Results  Component Value Date/Time   TSH 3.920 03/02/2022 10:20 AM   TSH 3.720 03/31/2021 02:35 PM   FREET4 1.49 03/02/2022 10:20 AM       Latest Ref Rng & Units 08/12/2022   10:37 AM 03/18/2022    9:06 AM 12/25/2021   10:59 AM  CBC  WBC 4.0 - 10.5 K/uL 10.0  7.3  7.7   Hemoglobin 12.0 - 15.0 g/dL 12.8  12.2  12.6   Hematocrit 36.0 - 46.0 % 37.2  36.3  37.1   Platelets 150 - 400 K/uL 270  308  244     Lab Results  Component Value Date/Time   VD25OH 57.1 03/02/2022 10:20 AM   VD25OH 45.4 12/25/2018 01:59 PM   VITAMINB12 175 (L) 12/25/2021 10:59 AM   VITAMINB12 300 12/25/2018 01:59 PM    Clinical ASCVD: No  The ASCVD Risk score (Arnett DK, et al., 2019) failed to calculate for the following reasons:   The 2019 ASCVD risk score is only valid for ages 41 to 88       09/07/2022   11:07 AM 03/02/2022    8:48 AM 02/24/2022    2:38 PM  Depression screen PHQ 2/9  Decreased Interest 0 3 0  Down, Depressed, Hopeless 0 0 0  PHQ - 2 Score 0 3 0  Altered sleeping 0 0   Tired, decreased energy 0 2   Change in appetite 1 0   Feeling bad or failure about yourself  1 0   Trouble concentrating 0 1   Moving slowly or fidgety/restless 0 3   Suicidal thoughts 0 0   PHQ-9 Score 2 9   Difficult doing work/chores Very difficult Very difficult      Social History   Tobacco Use  Smoking Status Former   Packs/day: 1.00   Years: 10.00   Total pack years: 10.00   Types: Cigarettes   Start  date: 11/29/1965   Quit date: 11/28/1976   Years since quitting: 46.1  Smokeless Tobacco Never   BP Readings from Last 3 Encounters:  12/16/22 (!) 98/58  09/07/22 110/63  08/27/22 128/68   Pulse Readings from Last 3 Encounters:  12/16/22 80  09/07/22 80  08/27/22 82   Wt Readings from Last 3 Encounters:  12/16/22 229 lb (103.9 kg)  09/07/22 222 lb 11.2 oz (101 kg)  08/27/22 219 lb 9.6 oz (99.6 kg)   BMI Readings from Last 3 Encounters:  12/16/22 43.27 kg/m  09/07/22 42.08 kg/m  08/27/22 41.49 kg/m    Allergies  Allergen Reactions   Erythromycin Other (See Comments)    Other reaction(s): Headache Headache and GI upset Headache and GI upset   Losartan Nausea And Vomiting   Metoprolol Nausea And Vomiting   Simvastatin Other (See Comments)    Other reaction(s): Muscle Pain Pain lower extremities Pain lower extremities   Dexlansoprazole Diarrhea   Lisinopril Cough   Prednisone     Other reaction(s): Constipation GI upset    Medications Reviewed Today     Reviewed by Virginia Crews, MD (Physician) on 09/07/22 at 1116  Med List Status: <None>   Medication Order Taking? Sig Documenting Provider Last Dose Status Informant  azelastine (ASTELIN) 0.1 % nasal spray [id number] Yes Place 2 sprays into both nostrils daily. [name], PA-C Taking Active   Cholecalciferol (VITAMIN D3) 5000 units CAPS [id number] Yes Take 1 capsule by mouth daily. [provider] Taking Active   clotrimazole-betamethasone ([place]) cream [id number] Yes APPLY EXTERNALLY TO THE AFFECTED AREA TWICE DAILY AS NEEDED Virginia Crews, MD Taking Active   Cyanocobalamin 500 [place] [id number] Yes Place under the tongue daily. [provider] Taking Active   esomeprazole (NEXIUM) 20 MG capsule [id number] Yes Take 40 mg by mouth daily at 12 noon.  [provider] Taking Active   ezetimibe (ZETIA) 10 MG tablet [id number] Yes TAKE 1 TABLET(10 MG) BY MOUTH DAILY  [name], Dionne Bucy, MD Taking Active   hydrALAZINE (APRESOLINE) 50 MG tablet [id number] Yes Take 1 tablet (50 mg total) by mouth 3 (three) times daily. Kate Sable, MD Taking Active   ipratropium (ATROVENT) 0.03 % nasal spray [id number] Yes USE 2 SPRAYS IN EACH NOSTRIL EVERY 8 HOURS AS NEEDED [name], PA-C Taking Active   levocetirizine (XYZAL) 5 MG tablet [id number] Yes Take 5 mg by mouth every evening. [provider] Taking Active   montelukast (SINGULAIR) 10 MG tablet [id number] Yes TAKE 1 TABLET(10 MG) BY MOUTH DAILY [name], PA-C Taking Active   Multiple Vitamins-Minerals (EYE VITAMINS PO) [id number] Yes Take 2 tablets by mouth daily.  [provider] Taking Active   potassium chloride SA (KLOR-CON M) 20 MEQ tablet [id number] Yes Take 2 tablets (40 mEq total) by mouth 2 (two) times daily. Kate Sable, MD Taking Active   pramipexole (MIRAPEX) 0.75 MG tablet [id number] Yes TAKE 1 TABLET(0.75 MG) BY MOUTH AT BEDTIME [name], PA-C Taking Active   spironolactone (ALDACTONE) 25 MG tablet [id number] Yes Take 1 tablet (25 mg total) by mouth daily. Kate Sable, MD Taking Active   Tiotropium Bromide Monohydrate (SPIRIVA RESPIMAT) 2.5 MCG/ACT AERS [id number] Yes Inhale 2 puffs into the lungs daily. Virginia Crews, MD Taking Active   torsemide [facility]) 20 MG tablet [id number] Yes Take 3 tablets (60 mg total) by mouth daily. Kate Sable, MD Taking Active   triamcinolone (NASACORT) 55 MCG/ACT AERO nasal inhaler [id number] Yes Place 2 sprays into the nose daily. [provider] Taking Active   triamcinolone ointment (KENALOG) 0.5 % [id number] Yes Apply 1 Application topically 2 (two) times daily as needed. [provider] Taking Active             SDOH:  (Social Determinants of Health) assessments and interventions performed: Yes SDOH Interventions    Flowsheet Row Telephone from 12/21/2022 in [place] from 02/24/2022 in [place] from 09/16/2020 in [facility]  Family Practice  SDOH Interventions     Food Insecurity Interventions Intervention Not Indicated Intervention Not Indicated --  Housing Interventions Intervention Not Indicated Intervention Not Indicated --  Transportation Interventions Intervention Not Indicated Intervention Not Indicated --  Financial Strain Interventions -- Intervention Not Indicated --  Physical Activity Interventions -- Patient Refused Patient Refused, Other (Comments)  [due to trouble walking]  Stress Interventions -- Intervention Not Indicated --  Social Connections Interventions -- Intervention Not Indicated --       Medication Assistance: None required.  Patient affirms current coverage meets needs.  Medication Access: Within the past 30 days, how often has patient missed a dose of medication? Misses noontime meds most days Is a pillbox or other method used to improve adherence? No  Factors that may affect medication adherence? no barriers identified Are meds synced by current pharmacy? No  Are meds delivered by current pharmacy? No  Does patient experience delays in picking up medications due to transportation concerns? No   [facility] Reviewed: Is patient disadvantaged to use [facility]?: Yes  Current Rx insurance plan: [place] Name and location of Current pharmacy:  [id number] [address], [place] [address] Phone: [phone] Fax: [phone]  [facility] services reviewed with patient today?: Yes  Patient requests to transfer care to [facility]?: No  Reason patient declined to change pharmacies: Disadvantaged due to insurance/mail order  Compliance/Adherence/Medication fill history: Care Gaps: Tdap Covid   AWV  Star-Rating Drugs: None noted   Assessment/Plan  Heart Failure (Goal: manage symptoms and prevent exacerbations) -Controlled -Last ejection fraction: 55-60% (Date: Apr 2022) -HF type: Diastolic -NYHA Class: II (slight limitation of activity) -AHA HF Stage: C (Heart disease and symptoms present) -Current treatment: Hydralazine 25 mg three times daily  Misses noontime pill most days.  Spironolactone 25 mg daily  Torsemide 60 mg daily  -Medications previously tried: NA  -Current home BP/HR readings: PT and home health checks, patient unaware of readings.  -Recommended to continue current medication  Hyperlipidemia: (LDL goal < 100) -Uncontrolled -Current treatment: Ezetimibe 10 mg daily  -Medications previously tried: NA  -Patient cholesterol uncontrolled. However given patient age and polypharmacy concerns, may be reasonable to discontinue cholesterol treatment at this time.  -Recommend stopping ezetimibe  Bronchitis (Goal: control symptoms and prevent exacerbations) -Not ideally controlled -Current treatment  Spiriva 2 puffs daily  -Medications previously tried: NA  -Previously having symptoms of shortness of breath and wheezing with congestion. Symptoms better controlled with Spiriva, but reports her inhaler runs out early.  -Pulmonary function testing: None -Recommend PFT testing.  -Recommended to continue current medication  Allergic Rhinitis (Goal: achieve symptom relief) -Controlled -Current treatment  Levocetirizine 5 mg nightly  Montelukast 10 mg nightly  Nasocort nasal spray  Ipratropium nasal spray  Azelastine nasal spray  -Medications previously tried: NA  -Recommended to continue current medication  Constipation (Goal: Improve symptoms) -Uncontrolled -Current treatment  Dulcolax 10 mg daily  Miralax 1 capful 1-2 times daily.  -Medications previously tried: NA  -Patient reports uncontrolled constipations resulting in multiple ED visits. She has  started dulcolax and Miralax and now her symptoms have transitioned to diarrhea. Symptoms may be related to polypharmacy concerns.  -Diet is rich in fruits and vegetables. Drinks 3-4 bottles of water daily + tea/coffee.  -DECREASE Dulcolax to 5 mg nightly -DECREASE Miralax to 1 capful daily as needed.  Recommend switching  pramipexole to ropinirole 0.25 mg nightly and consider discontinuing ezetimibe to minimize risk of drug-induced constipation.    Chronic Kidney Disease Stage 3b  -All medications assessed for renal dosing and appropriateness in chronic kidney disease. -Recommended to continue current medication  Junius Argyle, PharmD, [place], CPP  Clinical Pharmacist Practitioner  [facility] [phone]

## 2022-12-27 NOTE — Telephone Encounter (Signed)
Requested Prescriptions   Signed Prescriptions Disp Refills   potassium chloride SA (KLOR-CON M) 20 MEQ tablet 360 tablet 0    Sig: Take 2 tablets (40 mEq total) by mouth 2 (two) times daily.    Authorizing Provider: Kate Sable    Ordering User: Britt Bottom

## 2022-12-28 ENCOUNTER — Ambulatory Visit: Payer: Medicare Other | Admitting: Family Medicine

## 2022-12-28 DIAGNOSIS — N1832 Chronic kidney disease, stage 3b: Secondary | ICD-10-CM | POA: Diagnosis not present

## 2022-12-28 DIAGNOSIS — E7800 Pure hypercholesterolemia, unspecified: Secondary | ICD-10-CM | POA: Diagnosis not present

## 2022-12-28 DIAGNOSIS — I10 Essential (primary) hypertension: Secondary | ICD-10-CM | POA: Diagnosis not present

## 2022-12-28 DIAGNOSIS — R7303 Prediabetes: Secondary | ICD-10-CM | POA: Diagnosis not present

## 2022-12-29 ENCOUNTER — Telehealth: Payer: Self-pay | Admitting: Family Medicine

## 2022-12-29 ENCOUNTER — Ambulatory Visit: Payer: Self-pay | Admitting: *Deleted

## 2022-12-29 DIAGNOSIS — I130 Hypertensive heart and chronic kidney disease with heart failure and stage 1 through stage 4 chronic kidney disease, or unspecified chronic kidney disease: Secondary | ICD-10-CM | POA: Diagnosis not present

## 2022-12-29 DIAGNOSIS — I509 Heart failure, unspecified: Secondary | ICD-10-CM | POA: Diagnosis not present

## 2022-12-29 DIAGNOSIS — D631 Anemia in chronic kidney disease: Secondary | ICD-10-CM | POA: Diagnosis not present

## 2022-12-29 DIAGNOSIS — N183 Chronic kidney disease, stage 3 unspecified: Secondary | ICD-10-CM | POA: Diagnosis not present

## 2022-12-29 DIAGNOSIS — S82891D Other fracture of right lower leg, subsequent encounter for closed fracture with routine healing: Secondary | ICD-10-CM | POA: Diagnosis not present

## 2022-12-29 DIAGNOSIS — M1990 Unspecified osteoarthritis, unspecified site: Secondary | ICD-10-CM | POA: Diagnosis not present

## 2022-12-29 LAB — COMPREHENSIVE METABOLIC PANEL
ALT: 10 IU/L (ref 0–32)
AST: 15 IU/L (ref 0–40)
Albumin/Globulin Ratio: 1.8 (ref 1.2–2.2)
Albumin: 4 g/dL (ref 3.7–4.7)
Alkaline Phosphatase: 106 IU/L (ref 44–121)
BUN/Creatinine Ratio: 26 (ref 12–28)
BUN: 32 mg/dL — ABNORMAL HIGH (ref 8–27)
Bilirubin Total: 0.4 mg/dL (ref 0.0–1.2)
CO2: 24 mmol/L (ref 20–29)
Calcium: 9.6 mg/dL (ref 8.7–10.3)
Chloride: 98 mmol/L (ref 96–106)
Creatinine, Ser: 1.25 mg/dL — ABNORMAL HIGH (ref 0.57–1.00)
Globulin, Total: 2.2 g/dL (ref 1.5–4.5)
Glucose: 115 mg/dL — ABNORMAL HIGH (ref 70–99)
Potassium: 4.3 mmol/L (ref 3.5–5.2)
Sodium: 138 mmol/L (ref 134–144)
Total Protein: 6.2 g/dL (ref 6.0–8.5)
eGFR: 43 mL/min/{1.73_m2} — ABNORMAL LOW (ref >59)

## 2022-12-29 LAB — LIPID PANEL WITH LDL/HDL RATIO
Cholesterol, Total: 206 mg/dL — ABNORMAL HIGH (ref 100–199)
HDL: 66 mg/dL (ref >39)
LDL Chol Calc (NIH): 117 mg/dL — ABNORMAL HIGH (ref 0–99)
LDL/HDL Ratio: 1.8 ratio (ref 0.0–3.2)
Triglycerides: 130 mg/dL (ref 0–149)
VLDL Cholesterol Cal: 23 mg/dL (ref 5–40)

## 2022-12-29 LAB — HEMOGLOBIN A1C
Est. average glucose Bld gHb Est-mCnc: 131 mg/dL
Hgb A1c MFr Bld: 6.2 % — ABNORMAL HIGH (ref 4.8–5.6)

## 2022-12-29 NOTE — Telephone Encounter (Signed)
Sharee Pimple from  [facility]  states that patient has had an increase she is at 230.8 Patient is having SOB and fatigue and swelling in both feet. Right foot is swollen more than the left foot.     Sharee Pimple phone number: [phone]

## 2022-12-29 NOTE — Telephone Encounter (Signed)
Weight gain- 1/10-227,1/25 229.2 ,1/31 230.8 SOB, increased fatigue, refusing ED Patient is taking 60 mg bid PTA is in home with patient Reason for Disposition  [1] MODERATE difficulty breathing (e.g., speaks in phrases, SOB even at rest, pulse 100-120) AND [2] NEW-onset or WORSE than normal  Answer Assessment - Initial Assessment Questions 1. RESPIRATORY STATUS: "Describe your breathing?" (e.g., wheezing, shortness of breath, unable to speak, severe coughing)      labored 2. ONSET: "When did this breathing problem begin?"      Worse for a few days 3. PATTERN "Does the difficult breathing come and go, or has it been constant since it started?"      constant 4. SEVERITY: "How bad is your breathing?" (e.g., mild, moderate, severe)    - MILD: No SOB at rest, mild SOB with walking, speaks normally in sentences, can lie down, no retractions, pulse < 100.    - MODERATE: SOB at rest, SOB with minimal exertion and prefers to sit, cannot lie down flat, speaks in phrases, mild retractions, audible wheezing, pulse 100-120.    - SEVERE: Very SOB at rest, speaks in single words, struggling to breathe, sitting hunched forward, retractions, pulse > 120      moderate 5. RECURRENT SYMPTOM: "Have you had difficulty breathing before?" If Yes, ask: "When was the last time?" and "What happened that time?"      Yes-  6. CARDIAC HISTORY: "Do you have any history of heart disease?" (e.g., heart attack, angina, bypass surgery, angioplasty)      yes 7. LUNG HISTORY: "Do you have any history of lung disease?"  (e.g., pulmonary embolus, asthma, emphysema)     possible 8. CAUSE: "What do you think is causing the breathing problem?"      Edema, fluid retention 9. OTHER SYMPTOMS: "Do you have any other symptoms? (e.g., dizziness, runny nose, cough, chest pain, fever)     Some cough 10. O2 SATURATION MONITOR:  "Do you use an oxygen saturation monitor (pulse oximeter) at home?" If Yes, ask: "What is your reading (oxygen  level) today?" "What is your usual oxygen saturation reading?" (e.g., 95%)       98%  Protocols used: Breathing Difficulty-A-AH

## 2022-12-29 NOTE — Progress Notes (Unsigned)
Established patient visit   Patient: Gloria Mercer   DOB: [birth year]   82 y.o. Female  MRN: [id number] Visit Date: 12/30/2022  Today's healthcare provider: Lavon Paganini, MD   No chief complaint on file.  Subjective    HPI  Hypertension, follow-up  BP Readings from Last 3 Encounters:  12/16/22 (!) 98/58  09/07/22 110/63  08/27/22 128/68   Wt Readings from Last 3 Encounters:  12/16/22 229 lb (103.9 kg)  09/07/22 222 lb 11.2 oz (101 kg)  08/27/22 219 lb 9.6 oz (99.6 kg)     She was last seen for hypertension 3 months ago.  BP at that visit was 110/63. Management since that visit includes no changes.  She reports {excellent/good/fair/poor:19665} compliance with treatment. She {is/is not:9024} having side effects. {document side effects if present:1}  Use of agents associated with hypertension: {bp agents assoc with hypertension:511::"none"}.   Outside blood pressures are {***enter patient reported home BP readings, or 'not being checked':1}.  Pertinent labs Lab Results  Component Value Date   CHOL 206 (H) 12/28/2022   HDL 66 12/28/2022   LDLCALC 117 (H) 12/28/2022   TRIG 130 12/28/2022   CHOLHDL 3.2 12/25/2021   Lab Results  Component Value Date   NA 138 12/28/2022   K 4.3 12/28/2022   CREATININE 1.25 (H) 12/28/2022   EGFR 43 (L) 12/28/2022   GLUCOSE 115 (H) 12/28/2022   TSH 3.920 03/02/2022     The ASCVD Risk score (Arnett DK, et al., 2019) failed to calculate for the following reasons:   The 2019 ASCVD risk score is only valid for ages 16 to 67  --------------------------------------------------------------------------------------------------- Prediabetes, Follow-up  Lab Results  Component Value Date   HGBA1C 6.2 (H) 12/28/2022   HGBA1C 5.9 (H) 12/25/2021   HGBA1C 5.6 09/29/2020   GLUCOSE 115 (H) 12/28/2022   GLUCOSE 124 (H) 08/27/2022   GLUCOSE 137 (H) 08/12/2022    Last seen for for this3 months ago.  Management since that visit  includes no changes. Current symptoms include {Symptoms; diabetes:14075} and have been {Desc; course:15616}.  Pertinent Labs:    Component Value Date/Time   CHOL 206 (H) 12/28/2022 1056   TRIG 130 12/28/2022 1056   CHOLHDL 3.2 12/25/2021 1059   CREATININE 1.25 (H) 12/28/2022 1056   CREATININE 1.03 (H) 08/29/2017 1546    Wt Readings from Last 3 Encounters:  12/16/22 229 lb (103.9 kg)  09/07/22 222 lb 11.2 oz (101 kg)  08/27/22 219 lb 9.6 oz (99.6 kg)    ----------------------------------------------------------------------------------------- Lipid/Cholesterol, Follow-up  Last lipid panel Other pertinent labs  Lab Results  Component Value Date   CHOL 206 (H) 12/28/2022   HDL 66 12/28/2022   LDLCALC 117 (H) 12/28/2022   TRIG 130 12/28/2022   CHOLHDL 3.2 12/25/2021   Lab Results  Component Value Date   ALT 10 12/28/2022   AST 15 12/28/2022   PLT 270 08/12/2022   TSH 3.920 03/02/2022     She was last seen for this 3 months ago.  Management since that visit includes no changes.  She reports {excellent/good/fair/poor:19665} compliance with treatment. She {is/is not:9024} having side effects. {document side effects if present:1}  The ASCVD Risk score (Arnett DK, et al., 2019) failed to calculate for the following reasons:   The 2019 ASCVD risk score is only valid for ages 53 to 77  ---------------------------------------------------------------------------------------------------   Medications: Outpatient Medications Prior to Visit  Medication Sig   azelastine (ASTELIN) 0.1 % nasal spray USE 2  SPRAYS IN EACH NOSTRIL DAILY   bisacodyl (DULCOLAX) 5 MG EC tablet Take 5 mg by mouth daily as needed for moderate constipation.   Cholecalciferol (VITAMIN D3) 5000 units CAPS Take 1 capsule by mouth daily.   clotrimazole-betamethasone (LOTRISONE) cream APPLY EXTERNALLY TO THE AFFECTED AREA TWICE DAILY AS NEEDED   esomeprazole (NEXIUM) 20 MG capsule Take 40 mg by mouth daily at  12 noon.    ezetimibe (ZETIA) 10 MG tablet TAKE 1 TABLET(10 MG) BY MOUTH DAILY   hydrALAZINE (APRESOLINE) 50 MG tablet Take 1 tablet (50 mg total) by mouth 3 (three) times daily. (Patient taking differently: Take 25 mg by mouth in the morning and at bedtime.)   ipratropium (ATROVENT) 0.03 % nasal spray USE 2 SPRAYS IN EACH NOSTRIL EVERY 8 HOURS AS NEEDED   levocetirizine (XYZAL) 5 MG tablet Take 5 mg by mouth every evening.   montelukast (SINGULAIR) 10 MG tablet TAKE 1 TABLET(10 MG) BY MOUTH DAILY   Multiple Vitamins-Minerals (EYE VITAMINS PO) Take 2 tablets by mouth daily. Ocuvite 50+   polyethylene glycol (MIRALAX / GLYCOLAX) 17 g packet Take 17 g by mouth daily as needed.   potassium chloride SA (KLOR-CON M) 20 MEQ tablet Take 2 tablets (40 mEq total) by mouth 2 (two) times daily.   pramipexole (MIRAPEX) 0.75 MG tablet TAKE 1 TABLET(0.75 MG) BY MOUTH AT BEDTIME   spironolactone (ALDACTONE) 25 MG tablet Take 1 tablet (25 mg total) by mouth daily.   Tiotropium Bromide Monohydrate (SPIRIVA RESPIMAT) 2.5 MCG/ACT AERS Inhale 2 puffs into the lungs daily.   torsemide (DEMADEX) 20 MG tablet Take 3 tablets (60 mg total) by mouth daily.   triamcinolone (NASACORT) 55 MCG/ACT AERO nasal inhaler Place 2 sprays into the nose daily.   triamcinolone ointment (KENALOG) 0.5 % Apply 1 Application topically 2 (two) times daily as needed.   No facility-administered medications prior to visit.    Review of Systems  {Labs  Heme  Chem  Endocrine  Serology  Results Review (optional):23779}   Objective    There were no vitals taken for this visit. {Show previous vital signs (optional):23777}  Physical Exam  ***  No results found for any visits on 12/30/22.  Assessment & Plan     ***  No follow-ups on file.      {provider attestation***:1}   Lavon Paganini, MD  [facility] [phone] (phone) [phone] (fax)  Regino Ramirez

## 2022-12-29 NOTE — Telephone Encounter (Signed)
Home PTA visit- patient is out of perimeters  Chief Complaint: SOB Symptoms: SOB, increased weight gain, increase edema Frequency: worse over last few days Pertinent Negatives: Patient denies dizziness, runny nose, chest pain, fever Disposition: '[]'$ ED /'[]'$ Urgent Care (no appt availability in office) / '[]'$ Appointment(In office/virtual)/ '[]'$  [place] [facility] [facility]/ '[]'$ Home Care/ '[x]'$ Refused Recommended Disposition /'[]'$ [place] [facility]/ '[]'$  Follow-up with PCP Additional Notes: Patient has OV scheduled for tomorrow- declines ED disposition- advised call 911 if she gets worse

## 2022-12-29 NOTE — Telephone Encounter (Signed)
There is also a triage nurse note on this and patient is scheduled for tomorrow and refused to go to the ED

## 2022-12-30 ENCOUNTER — Ambulatory Visit (INDEPENDENT_AMBULATORY_CARE_PROVIDER_SITE_OTHER): Payer: Medicare Other | Admitting: Family Medicine

## 2022-12-30 ENCOUNTER — Encounter: Payer: Self-pay | Admitting: Family Medicine

## 2022-12-30 VITALS — BP 108/65 | HR 79 | Temp 98.1°F | Resp 20 | Wt 231.1 lb

## 2022-12-30 DIAGNOSIS — I10 Essential (primary) hypertension: Secondary | ICD-10-CM

## 2022-12-30 DIAGNOSIS — I5032 Chronic diastolic (congestive) heart failure: Secondary | ICD-10-CM | POA: Diagnosis not present

## 2022-12-30 DIAGNOSIS — D692 Other nonthrombocytopenic purpura: Secondary | ICD-10-CM | POA: Diagnosis not present

## 2022-12-30 DIAGNOSIS — K5903 Drug induced constipation: Secondary | ICD-10-CM

## 2022-12-30 DIAGNOSIS — N1832 Chronic kidney disease, stage 3b: Secondary | ICD-10-CM | POA: Diagnosis not present

## 2022-12-30 DIAGNOSIS — R7303 Prediabetes: Secondary | ICD-10-CM

## 2022-12-30 MED ORDER — SPIRIVA RESPIMAT 2.5 MCG/ACT IN AERS: 2.0000 | INHALATION_SPRAY | Freq: Every day | RESPIRATORY_TRACT | 11 refills | Status: DC

## 2022-12-30 NOTE — Assessment & Plan Note (Signed)
Higher dose seems to be over effective Now with diarrhea Trial of miralax 1 capful daily and 1-2 dulcolax qhs

## 2022-12-30 NOTE — Assessment & Plan Note (Signed)
Stable GFR on last labs

## 2022-12-30 NOTE — Assessment & Plan Note (Signed)
Discussed importance of healthy weight management Discussed diet and exercise  

## 2022-12-30 NOTE — Telephone Encounter (Signed)
Noted - will evaluate patient at appt today

## 2022-12-30 NOTE — Telephone Encounter (Signed)
Noted  

## 2022-12-30 NOTE — Assessment & Plan Note (Signed)
Well controlled Continue current medications Reviewed metabolic panel F/u in 6 months

## 2022-12-30 NOTE — Assessment & Plan Note (Signed)
Recommend low carb diet Discussed A1c from last labs

## 2022-12-30 NOTE — Assessment & Plan Note (Signed)
Stable Slight increase in swelling, but lungs are clear Agree to leave diuretic doses the same and instead work on fluid restriction and low sodium intake Upcoming f/u with Cardiology next week

## 2022-12-30 NOTE — Assessment & Plan Note (Signed)
stable °

## 2023-01-04 DIAGNOSIS — N183 Chronic kidney disease, stage 3 unspecified: Secondary | ICD-10-CM | POA: Diagnosis not present

## 2023-01-04 DIAGNOSIS — S82891D Other fracture of right lower leg, subsequent encounter for closed fracture with routine healing: Secondary | ICD-10-CM | POA: Diagnosis not present

## 2023-01-04 DIAGNOSIS — D631 Anemia in chronic kidney disease: Secondary | ICD-10-CM | POA: Diagnosis not present

## 2023-01-04 DIAGNOSIS — M1990 Unspecified osteoarthritis, unspecified site: Secondary | ICD-10-CM | POA: Diagnosis not present

## 2023-01-04 DIAGNOSIS — I130 Hypertensive heart and chronic kidney disease with heart failure and stage 1 through stage 4 chronic kidney disease, or unspecified chronic kidney disease: Secondary | ICD-10-CM | POA: Diagnosis not present

## 2023-01-04 DIAGNOSIS — I509 Heart failure, unspecified: Secondary | ICD-10-CM | POA: Diagnosis not present

## 2023-01-05 ENCOUNTER — Encounter: Payer: Self-pay | Admitting: Cardiology

## 2023-01-05 ENCOUNTER — Ambulatory Visit: Payer: Medicare Other | Attending: Cardiology | Admitting: Cardiology

## 2023-01-05 VITALS — BP 120/70 | HR 77 | Ht 61.0 in | Wt 234.2 lb

## 2023-01-05 DIAGNOSIS — I509 Heart failure, unspecified: Secondary | ICD-10-CM | POA: Insufficient documentation

## 2023-01-05 DIAGNOSIS — I10 Essential (primary) hypertension: Secondary | ICD-10-CM | POA: Insufficient documentation

## 2023-01-05 MED ORDER — HYDRALAZINE HCL 50 MG PO TABS: 50.0000 mg | ORAL_TABLET | Freq: Two times a day (BID) | ORAL | 3 refills | Status: DC

## 2023-01-05 MED ORDER — TORSEMIDE 20 MG PO TABS: 40.0000 mg | ORAL_TABLET | Freq: Two times a day (BID) | ORAL | 0 refills | Status: DC

## 2023-01-05 NOTE — Progress Notes (Signed)
Cardiology Office Note:    Date:  01/05/2023   ID:  Gloria Mercer, DOB [birth year], MRN [id number]  PCP:  Virginia Crews, [place]  Cardiologist:  Kate Sable, MD  Advanced Practice Provider:  No care team member to display Electrophysiologist:  None       Referring MD: Virginia Crews, MD   Chief Complaint  Patient presents with   Other    2 month f/u c/o sob and edema legs/feet. Meds reviewed verbally with pt.    History of Present Illness:    Gloria Mercer is a 82 y.o. female with a hx of HFpEF, hypertension, obesity, CKD presents for follow-up.   Being seen for HFpEF, leg edema.  Torsemide previously increased to 60 mg daily, states edema improved slightly, although still present.  Previously did not tolerate metolazone, blood pressures have been normal but around low 100s.  Prior notes Echocardiogram 1/75/1025 normal systolic function, impaired relaxation, EF 55 to 60%. Not tolerant to several BP meds including losartan, metoprolol, lisinopril. Intolerant to metolazone, developed significant constipation.   Past Medical History:  Diagnosis Date   Allergic rhinitis    on allergy shots, sees Dr. Pryor Ochoa   Anemia    Arthritis    B12 deficiency    Back pain    Bilateral edema of lower extremity    Cataract    CHF (congestive heart failure) (HCC)    Constipation    Edema    Gallbladder problem    GERD (gastroesophageal reflux disease)    History of stomach ulcers    Hyperlipidemia    Hypertension    Joint pain    Macular degeneration    follow[facility] by Dr Garwin Brothers at [facility]   Osteoarthritis    Other fatigue    Prediabetes    Proteinuria    RLS (restless legs syndrome)    Sciatica    Shortness of breath    Shortness of breath on exertion    Sleep apnea    Stage 3 chronic kidney disease (HCC)    Swallowing difficulty    Vitamin D deficiency     Past Surgical History:  Procedure Laterality Date    CHOLECYSTECTOMY  2002   TUBAL LIGATION  1978    Current Medications: Current Meds  Medication Sig   azelastine (ASTELIN) 0.1 % nasal spray USE 2 SPRAYS IN EACH NOSTRIL DAILY   bisacodyl (DULCOLAX) 5 MG EC tablet Take 5 mg by mouth daily as needed for moderate constipation.   Cholecalciferol (VITAMIN D3) 5000 units CAPS Take 1 capsule by mouth daily.   clotrimazole-betamethasone (LOTRISONE) cream APPLY EXTERNALLY TO THE AFFECTED AREA TWICE DAILY AS NEEDED   esomeprazole (NEXIUM) 20 MG capsule Take 40 mg by mouth daily at 12 noon.    ezetimibe (ZETIA) 10 MG tablet TAKE 1 TABLET(10 MG) BY MOUTH DAILY   hydrALAZINE (APRESOLINE) 50 MG tablet Take 1 tablet (50 mg total) by mouth in the morning and at bedtime.   ipratropium (ATROVENT) 0.03 % nasal spray USE 2 SPRAYS IN EACH NOSTRIL EVERY 8 HOURS AS NEEDED   levocetirizine (XYZAL) 5 MG tablet Take 5 mg by mouth every evening.   montelukast (SINGULAIR) 10 MG tablet TAKE 1 TABLET(10 MG) BY MOUTH DAILY   Multiple Vitamins-Minerals (EYE VITAMINS PO) Take 2 tablets by mouth daily. Ocuvite 50+   polyethylene glycol (MIRALAX / GLYCOLAX) 17 g packet Take 17 g by mouth daily as needed.   potassium chloride  SA (KLOR-CON M) 20 MEQ tablet Take 2 tablets (40 mEq total) by mouth 2 (two) times daily.   pramipexole (MIRAPEX) 0.75 MG tablet TAKE 1 TABLET(0.75 MG) BY MOUTH AT BEDTIME   spironolactone (ALDACTONE) 25 MG tablet Take 1 tablet (25 mg total) by mouth daily.   Tiotropium Bromide Monohydrate (SPIRIVA RESPIMAT) 2.5 MCG/ACT AERS Inhale 2 puffs into the lungs daily.   triamcinolone (NASACORT) 55 MCG/ACT AERO nasal inhaler Place 2 sprays into the nose daily.   triamcinolone ointment (KENALOG) 0.5 % Apply 1 Application topically 2 (two) times daily as needed.   [DISCONTINUED] hydrALAZINE (APRESOLINE) 50 MG tablet Take 1 tablet (50 mg total) by mouth 3 (three) times daily. (Patient taking differently: Take 25 mg by mouth in the morning and at bedtime.)    [DISCONTINUED] torsemide (DEMADEX) 20 MG tablet Take 3 tablets (60 mg total) by mouth daily.     Allergies:   Erythromycin, Losartan, Metoprolol, Simvastatin, Dexlansoprazole, Lisinopril, and Prednisone   Social History   Socioeconomic History   Marital status: Divorced    Spouse name: Not on file   Number of children: 1   Years of education: Not on file   Highest education level: Bachelor's degree (e.g., BA, AB, BS)  Occupational History   Occupation: retired in 2001    Comment: social work Scientist, physiological  Tobacco Use   Smoking status: Former    Packs/day: 1.00    Years: 10.00    Total pack years: 10.00    Types: Cigarettes    Start date: 11/29/1965    Quit date: 11/28/1976    Years since quitting: 46.1   Smokeless tobacco: Never  Vaping Use   Vaping Use: Never used  Substance and Sexual Activity   Alcohol use: Not Currently    Comment: rarely, at holidays   Drug use: No   Sexual activity: Not Currently  Other Topics Concern   Not on file  Social History Narrative   Not on file   Social Determinants of Health   Financial Resource Strain: Low Risk  (12/27/2022)   Overall Financial Resource Strain (CARDIA)    Difficulty of Paying Living Expenses: Not hard at all  Food Insecurity: No Food Insecurity (12/21/2022)   Hunger Vital Sign    Worried About Running Out of Food in the Last Year: Never true    Ran Out of Food in the Last Year: Never true  Transportation Needs: No Transportation Needs (12/27/2022)   PRAPARE - Hydrologist (Medical): No    Lack of Transportation (Non-Medical): No  Physical Activity: Inactive (02/24/2022)   Exercise Vital Sign    Days of Exercise per Week: 0 days    Minutes of Exercise per Session: 0 min  Stress: No Stress Concern Present (02/24/2022)   [place]    Feeling of Stress : Not at all  Social Connections: Unknown (02/24/2022)   Social  Connection and Isolation Panel [NHANES]    Frequency of Communication with Friends and Family: More than three times a week    Frequency of Social Gatherings with Friends and Family: Once a week    Attends Religious Services: Never    Marine scientist or Organizations: No    Attends Music therapist: Never    Marital Status: Not on file     Family History: The patient's family history includes Diabetes in her father; Heart disease (age of onset: 54) in her father;  Hyperlipidemia in her mother; Hypertension in her father and mother; Myelodysplastic syndrome in her sister; Prostate cancer in her paternal grandfather; Seizures in her mother. There is no history of Breast cancer or Cervical cancer.  ROS:   Please see the history of present illness.     All other systems reviewed and are negative.  EKGs/Labs/Other Studies Reviewed:    The following studies were reviewed today:   EKG:  EKG is ordered today.  EKG shows sinus rhythm, PACs.  Recent Labs: 03/02/2022: TSH 3.920 03/18/2022: Magnesium 1.9 08/12/2022: Hemoglobin 12.8; Platelets 270 12/28/2022: ALT 10; BUN 32; Creatinine, Ser 1.25; Potassium 4.3; Sodium 138  Recent Lipid Panel    Component Value Date/Time   CHOL 206 (H) 12/28/2022 1056   TRIG 130 12/28/2022 1056   HDL 66 12/28/2022 1056   CHOLHDL 3.2 12/25/2021 1059   LDLCALC 117 (H) 12/28/2022 1056     Risk Assessment/Calculations:      Physical Exam:    VS:  BP 120/70 (BP Location: Left Arm, Patient Position: Sitting, Cuff Size: Large)   Pulse 77   Ht '5\' 1"'$  (1.549 m)   Wt 234 lb 4 oz (106.3 kg)   SpO2 96%   BMI 44.26 kg/m     Wt Readings from Last 3 Encounters:  01/05/23 234 lb 4 oz (106.3 kg)  12/30/22 231 lb 1.6 oz (104.8 kg)  12/16/22 229 lb (103.9 kg)     GEN:  Well nourished, well developed in no acute distress HEENT: Normal NECK: Unable to assess JVD due to body habitus CARDIAC: RRR, no murmurs, rubs, gallops RESPIRATORY:  Decreased breath sounds at bases ABDOMEN: Soft, non-tender, non-distended MUSCULOSKELETAL:  2+ edema; right foot in [place] boot SKIN: Warm and dry NEUROLOGIC:  Alert and oriented x 3 PSYCHIATRIC:  Normal affect   ASSESSMENT:    1. Heart failure, unspecified HF chronicity, unspecified heart failure type ([place])   2. Primary hypertension   3. Morbid obesity ([place])    PLAN:    In order of problems listed above:  HFpEF, bilateral lower extremity edema, echo with normal systolic function, impaired relaxation.  Increase torsemide 40 mg twice daily.  Check BMP today and in 1 week.  Continue Aldactone.  Patient did not tolerate metolazone in the past, developed constipation.  Refer to advanced heart failure team,  Furoscix consideration. Hypertension, BP  controlled/low normal.  Reduce hydralazine to 50 mg twice daily with starting torsemide.  Continue Aldactone. Morbid obesity, low-calorie diet, recommended  Follow-up in 6-8 weeks   Medication Adjustments/Labs and Tests Ordered: Current medicines are reviewed at length with the patient today.  Concerns regarding medicines are outlined above.  Orders Placed This Encounter  Procedures   Basic Metabolic Panel (BMET)   AMB referral to CHF clinic   EKG 12-Lead    Meds ordered this encounter  Medications   torsemide (DEMADEX) 20 MG tablet    Sig: Take 2 tablets (40 mg total) by mouth 2 (two) times daily.    Dispense:  360 tablet    Refill:  0   hydrALAZINE (APRESOLINE) 50 MG tablet    Sig: Take 1 tablet (50 mg total) by mouth in the morning and at bedtime.    Dispense:  180 tablet    Refill:  3      Patient Instructions  Medication Instructions:   INCREASE Torsemide - take 2 tablet (40 mg) by mouth twice a day.  DECREASE Hydralazine - take one tablet by mouth twice a day.   *  If you need a refill on your cardiac medications before your next appointment, please call your pharmacy*   Lab Work:  Your physician recommends that you  return for lab work in: 1 weeks at the medical mall. No appt is needed. Hours are M-F 7AM- 6 PM.  If you have labs (blood work) drawn today and your tests are completely normal, you will receive your results only by: [place] (if you have MyChart) OR A paper copy in the mail If you have any lab test that is abnormal or we need to change your treatment, we will call you to review the results.   Testing/Procedures:  None Ordered   Follow-Up: At [facility], you and your health needs are our priority.  As part of our continuing mission to provide you with exceptional heart care, we have created designated Provider Care Teams.  These Care Teams include your primary Cardiologist (physician) and Advanced Practice Providers (APPs -  Physician Assistants and Nurse Practitioners) who all work together to provide you with the care you need, when you need it.  We recommend signing up for the patient portal called "MyChart".  Sign up information is provided on this After Visit Summary.  MyChart is used to connect with patients for Virtual Visits (Telemedicine).  Patients are able to view lab/test results, encounter notes, upcoming appointments, etc.  Non-urgent messages can be sent to your provider as well.   To learn more about what you can do with MyChart, go to [email or web address].    Your next appointment:   6 week(s)  Provider:   You may see Kate Sable, MD or one of the following Advanced Practice Providers on your designated Care Team:   Murray Hodgkins, NP Christell Faith, PA-C Cadence Kathlen Mody, PA-C Gerrie Nordmann, NP   Signed, Kate Sable, MD  01/05/2023 3:04 PM    [place]

## 2023-01-05 NOTE — Patient Instructions (Signed)
Medication Instructions:   INCREASE Torsemide - take 2 tablet (40 mg) by mouth twice a day.  DECREASE Hydralazine - take one tablet by mouth twice a day.   *If you need a refill on your cardiac medications before your next appointment, please call your pharmacy*   Lab Work:  Your physician recommends that you return for lab work in: 1 weeks [facility]. No appt is needed. Hours are M-F 7AM- 6 PM.  If you have labs (blood work) drawn today and your tests are completely normal, you will receive your results only by: [place] (if you have MyChart) OR A paper copy in the mail If you have any lab test that is abnormal or we need to change your treatment, we will call you to review the results.   Testing/Procedures:  None Ordered   Follow-Up: At [facility], you and your health needs are our priority.  As part of our continuing mission to provide you with exceptional heart care, we have created designated Provider Care Teams.  These Care Teams include your primary Cardiologist (physician) and Advanced Practice Providers (APPs -  Physician Assistants and Nurse Practitioners) who all work together to provide you with the care you need, when you need it.  We recommend signing up for the patient portal called "MyChart".  Sign up information is provided on this After Visit Summary.  MyChart is used to connect with patients for Virtual Visits (Telemedicine).  Patients are able to view lab/test results, encounter notes, upcoming appointments, etc.  Non-urgent messages can be sent to your provider as well.   To learn more about what you can do with MyChart, go to [email or web address].    Your next appointment:   6 week(s)  Provider:   You may see Kate Sable, MD or one of the following Advanced Practice Providers on your designated Care Team:   Murray Hodgkins, NP Christell Faith, PA-C Cadence Kathlen Mody, PA-C Gerrie Nordmann, NP

## 2023-01-06 ENCOUNTER — Other Ambulatory Visit: Payer: Self-pay

## 2023-01-06 ENCOUNTER — Telehealth: Payer: Self-pay | Admitting: Cardiology

## 2023-01-06 DIAGNOSIS — N183 Chronic kidney disease, stage 3 unspecified: Secondary | ICD-10-CM | POA: Diagnosis not present

## 2023-01-06 DIAGNOSIS — I509 Heart failure, unspecified: Secondary | ICD-10-CM | POA: Diagnosis not present

## 2023-01-06 DIAGNOSIS — I130 Hypertensive heart and chronic kidney disease with heart failure and stage 1 through stage 4 chronic kidney disease, or unspecified chronic kidney disease: Secondary | ICD-10-CM | POA: Diagnosis not present

## 2023-01-06 DIAGNOSIS — D631 Anemia in chronic kidney disease: Secondary | ICD-10-CM | POA: Diagnosis not present

## 2023-01-06 DIAGNOSIS — M1990 Unspecified osteoarthritis, unspecified site: Secondary | ICD-10-CM | POA: Diagnosis not present

## 2023-01-06 DIAGNOSIS — S82891D Other fracture of right lower leg, subsequent encounter for closed fracture with routine healing: Secondary | ICD-10-CM | POA: Diagnosis not present

## 2023-01-06 MED ORDER — HYDRALAZINE HCL 25 MG PO TABS: 25.0000 mg | ORAL_TABLET | Freq: Two times a day (BID) | ORAL | 3 refills | Status: DC

## 2023-01-06 NOTE — Telephone Encounter (Signed)
Called patient.  Made her aware of Dr. Garen Lah recommendations.   Medication list updated.

## 2023-01-06 NOTE — Telephone Encounter (Signed)
Patient reports that yesterday at her visit her medication list stated Hydralazine 50 mg tid -- she thought this was what she was taking.   We reduced this to 50 mg BID yesterday.   Patient called today to confirm that her bottle actually states that she has been taking Hydralazine 25 mg TID.   And just wanted to confirm what dose she should be taking.

## 2023-01-06 NOTE — Addendum Note (Signed)
Addended by: Janan Ridge on: 01/06/2023 03:35 PM   Modules accepted: Orders

## 2023-01-06 NOTE — Telephone Encounter (Signed)
Pt c/o medication issue:  1. Name of Medication: Hydralazine 25 mg  2. How are you currently taking this medication (dosage and times per day)?   3. Are you having a reaction (difficulty breathing--STAT)?   4. What is your medication issue? Yesterday on form it said 50 mg of Hydralazine, which milligram is correct?

## 2023-01-07 ENCOUNTER — Telehealth: Payer: Self-pay | Admitting: Cardiology

## 2023-01-07 NOTE — Telephone Encounter (Signed)
Called both phones listed for the patient and patients son in attempt to get a new patient appointment with one of our advanced HF doctors but was unable to reach anybody after receiving a referral from [place]. LVM on all phones for someone to give [place] a call back.   Costella Schwarz, NT

## 2023-01-14 ENCOUNTER — Ambulatory Visit: Payer: Self-pay

## 2023-01-14 ENCOUNTER — Telehealth: Payer: Self-pay | Admitting: Family Medicine

## 2023-01-14 ENCOUNTER — Telehealth (INDEPENDENT_AMBULATORY_CARE_PROVIDER_SITE_OTHER): Payer: Medicare Other | Admitting: Cardiology

## 2023-01-14 DIAGNOSIS — R600 Localized edema: Secondary | ICD-10-CM | POA: Diagnosis not present

## 2023-01-14 NOTE — Telephone Encounter (Signed)
Unable to reach the patient.  To ensure treatment is correct pt needs to be seen at an urgent care of ER for testing of flu or COVID.

## 2023-01-14 NOTE — Telephone Encounter (Signed)
Spoke with patient and informed of Dr. Thereasa Solo instructions as follows: Recommend increasing torsemide to 60 mg twice daily.  Continue Aldactone 25 mg daily, hydralazine 25 mg twice daily.  Check BMP in 1 week.  Follow-up in 2 weeks for additional recommendations. Patient voiced understanding with read back.

## 2023-01-14 NOTE — Telephone Encounter (Signed)
Please see the MyChart message reply(ies) for my assessment and plan.    This patient gave consent for this Medical Advice Message and is aware that it may result in a bill to [employer], as well as the possibility of receiving a bill for a co-payment or deductible. They are an established patient, but are not seeking medical advice exclusively about a problem treated during an in person or video visit in the last seven days. I did not recommend an in person or video visit within seven days of my reply.    I spent a total of 15 minutes cumulative time within 7 days through [employer].  Kate Sable, MD

## 2023-01-14 NOTE — Telephone Encounter (Signed)
Pt c/o swelling: STAT is pt has developed SOB within 24 hours  If swelling, where is the swelling located? Feet and legs   How much weight have you gained and in what time span? Not sure   Have you gained 3 pounds in a day or 5 pounds in a week? Not sure   Do you have a log of your daily weights (if so, list)? No   Are you currently taking a fluid pill? Yes   Are you currently SOB? No   Have you traveled recently? No   She states Dr. Mylo Red changed her fluid medication but she does not think it is working. She states she hasn't kept a log of her weights, but it hasn't gone up significantly only some.

## 2023-01-14 NOTE — Telephone Encounter (Signed)
  Chief Complaint: Cough, chest congestion, sore throat, weakness, fatigue Symptoms: above Frequency: weds Pertinent Negatives: Patient denies Current fever Disposition: []$ ED /[]$ Urgent Care (no appt availability in office) / []$ Appointment(In office/virtual)/ []$  [place] [facility]/ []$ Home Care/ []$ Refused Recommended Disposition /[]$ [place] [facility]/ [x]$  Follow-up with PCP Additional Notes: PT started feeling poorly weds. Pt felt a little better yesterday, but now feels poorly. Pt does not have ability to have a vv. Pt is hoping that she can speak with provider, and medication called in. Please advise.  Summary: Chest Congestion   Patient is experiencing chest congestion and sore throat and wants to know what she can take. Patient also feels weak.     Reason for Disposition  Patient is HIGH RISK (e.g., age > 40 years, pregnant, HIV+, or chronic medical condition)  Answer Assessment - Initial Assessment Questions 1. WORST SYMPTOM: "What is your worst symptom?" (e.g., cough, runny nose, muscle aches, headache, sore throat, fever)      Cough, sore throat, cough, fatigue 2. ONSET: "When did your flu symptoms start?"      Weds  3. COUGH: "How bad is the cough?"       Comes deep - brings some up 4. RESPIRATORY DISTRESS: "Describe your breathing."      Always has trouble breathing 5. FEVER: "Do you have a fever?" If Yes, ask: "What is your temperature, how was it measured, and when did it start?"     No fever now 6. EXPOSURE: "Were you exposed to someone with influenza?"       Unsure 7. FLU VACCINE: "Did you get a flu shot this year?"    yes 8. HIGH RISK DISEASE: "Do you have any chronic medical problems?" (e.g., heart or lung disease, asthma, weak immune system, or other HIGH RISK conditions)     Heart problems 9. PREGNANCY: "Is there any chance you are pregnant?" "When was your last menstrual period?"      10. OTHER SYMPTOMS: "Do you have any other symptoms?"  (e.g., runny nose,  muscle aches, headache, sore throat)       Chest congestion, sore throat, Fatigue  Protocols used: Influenza (Flu) - [facility]

## 2023-01-14 NOTE — Telephone Encounter (Signed)
Spoke with patient and she stated that she wants to know if she can go back to her previous medication regimen prior to last office visit 01/05/23. Her current medications of the following have not reduced bilateral lower extremity edema. Patient stated that the medication regimen she was on prior worked better for the bilateral lower extremity edema. Please advise  hydrALAZINE (APRESOLINE) 25 MG tablet - Take 1 tablet (25 mg total) by mouth in the morning and at bedtime.   spironolactone (ALDACTONE) 25 MG tablet - Take 1 tablet (25 mg total) by mouth daily.  torsemide (DEMADEX) 20 MG tablet - Take 2 tablets (40 mg total) by mouth 2 (two) times daily.

## 2023-01-31 ENCOUNTER — Other Ambulatory Visit: Payer: Self-pay | Admitting: Family Medicine

## 2023-02-01 ENCOUNTER — Other Ambulatory Visit
Admission: RE | Admit: 2023-02-01 | Discharge: 2023-02-01 | Disposition: A | Discharge status: Home / Self Care | Payer: Medicare Other | Source: Ambulatory Visit | Attending: Cardiology | Admitting: Cardiology

## 2023-02-01 DIAGNOSIS — I509 Heart failure, unspecified: Secondary | ICD-10-CM | POA: Diagnosis not present

## 2023-02-01 LAB — BASIC METABOLIC PANEL
Anion gap: 9 (ref 5–15)
BUN: 36 mg/dL — ABNORMAL HIGH (ref 8–23)
CO2: 28 mmol/L (ref 22–32)
Calcium: 9.3 mg/dL (ref 8.9–10.3)
Chloride: 100 mmol/L (ref 98–111)
Creatinine, Ser: 1.36 mg/dL — ABNORMAL HIGH (ref 0.44–1.00)
GFR, Estimated: 39 mL/min — ABNORMAL LOW (ref >60)
Glucose, Bld: 125 mg/dL — ABNORMAL HIGH (ref 70–99)
Potassium: 4.5 mmol/L (ref 3.5–5.1)
Sodium: 137 mmol/L (ref 135–145)

## 2023-02-07 ENCOUNTER — Encounter: Payer: Self-pay | Admitting: Internal Medicine

## 2023-02-07 ENCOUNTER — Other Ambulatory Visit
Admission: RE | Admit: 2023-02-07 | Discharge: 2023-02-07 | Disposition: A | Discharge status: Home / Self Care | Payer: Medicare Other | Source: Ambulatory Visit | Attending: Internal Medicine | Admitting: Internal Medicine

## 2023-02-07 ENCOUNTER — Ambulatory Visit (HOSPITAL_BASED_OUTPATIENT_CLINIC_OR_DEPARTMENT_OTHER): Payer: Medicare Other | Admitting: Internal Medicine

## 2023-02-07 VITALS — BP 122/58 | HR 91 | Wt 237.8 lb

## 2023-02-07 DIAGNOSIS — R600 Localized edema: Secondary | ICD-10-CM

## 2023-02-07 DIAGNOSIS — I509 Heart failure, unspecified: Secondary | ICD-10-CM | POA: Insufficient documentation

## 2023-02-07 LAB — BASIC METABOLIC PANEL
Anion gap: 9 (ref 5–15)
BUN: 37 mg/dL — ABNORMAL HIGH (ref 8–23)
CO2: 28 mmol/L (ref 22–32)
Calcium: 9.3 mg/dL (ref 8.9–10.3)
Chloride: 101 mmol/L (ref 98–111)
Creatinine, Ser: 1.41 mg/dL — ABNORMAL HIGH (ref 0.44–1.00)
GFR, Estimated: 37 mL/min — ABNORMAL LOW (ref >60)
Glucose, Bld: 102 mg/dL — ABNORMAL HIGH (ref 70–99)
Potassium: 4.2 mmol/L (ref 3.5–5.1)
Sodium: 138 mmol/L (ref 135–145)

## 2023-02-07 LAB — BRAIN NATRIURETIC PEPTIDE: B Natriuretic Peptide: 28.5 pg/mL (ref 0.0–100.0)

## 2023-02-07 MED ORDER — EMPAGLIFLOZIN 10 MG PO TABS: 10.0000 mg | ORAL_TABLET | Freq: Every day | ORAL | 6 refills | Status: DC

## 2023-02-07 NOTE — Progress Notes (Signed)
ADVANCED HF CLINIC CONSULT NOTE  Referring Physician: Virginia Crews, MD Primary Care: Virginia Crews, MD Primary Cardiologist: Kate Sable, MD  HPI:  Gloria Mercer is a 82 y.o. female with a hx of HFpEF, hypertension, obesity, CKD 3b.   She is a former SW with Insurance underwriter and [employer]   Echocardiogram 03/18/2021 EF 55-60% G1DD RV ok Personally reviewed  Says she has struggled with LE edema for a long time. Says swelling improved previously with metolazone but had to stop taking due to severe constipation.   Currently on torsemide 60 bid and spiro 25. Has not tried SGLT2i Says swelling is tolerable currently. Gets around fairly well with walker. Lives alone. Doesn't go out much. Orders groceries online. Can't get compression hose on/off. Sleeps in recliner many nights.   Has been diagnosed with OSA but refuses CPAP. HST 6/22 AHI 13.5 with sat down to 81%    Review of Systems: [y] = yes, '[ ]'$  = no   General: Weight gain '[ ]'$ ; Weight loss '[ ]'$ ; Anorexia '[ ]'$ ; Fatigue [ y]; Fever '[ ]'$ ; Chills '[ ]'$ ; Weakness '[ ]'$   Cardiac: Chest pain/pressure '[ ]'$ ; Resting SOB '[ ]'$ ; Exertional SOB [ y]; Orthopnea '[ ]'$ ; Pedal Edema [ y]; Palpitations '[ ]'$ ; Syncope '[ ]'$ ; Presyncope '[ ]'$ ; Paroxysmal nocturnal dyspnea'[ ]'$   Pulmonary: Cough '[ ]'$ ; Wheezing'[ ]'$ ; Hemoptysis'[ ]'$ ; Sputum '[ ]'$ ; Snoring [ y]  GI: Vomiting'[ ]'$ ; Dysphagia'[ ]'$ ; Melena'[ ]'$ ; Hematochezia '[ ]'$ ; Heartburn'[ ]'$ ; Abdominal pain '[ ]'$ ; Constipation '[ ]'$ ; Diarrhea '[ ]'$ ; BRBPR '[ ]'$   GU: Hematuria'[ ]'$ ; Dysuria '[ ]'$ ; Nocturia'[ ]'$   Vascular: Pain in legs with walking '[ ]'$ ; Pain in feet with lying flat '[ ]'$ ; Non-healing sores '[ ]'$ ; Stroke '[ ]'$ ; TIA '[ ]'$ ; Slurred speech '[ ]'$ ;  Neuro: Headaches'[ ]'$ ; Vertigo'[ ]'$ ; Seizures'[ ]'$ ; Paresthesias'[ ]'$ ;Blurred vision '[ ]'$ ; Diplopia '[ ]'$ ; Vision changes '[ ]'$   Ortho/Skin: Arthritis [ y]; Joint pain Blue.Reese ]; Muscle pain '[ ]'$ ; Joint swelling '[ ]'$ ; Back Pain '[ ]'$ ; Rash '[ ]'$   Psych: Depression'[ ]'$ ; Anxiety'[ ]'$   Heme: Bleeding problems  '[ ]'$ ; Clotting disorders '[ ]'$ ; Anemia '[ ]'$   Endocrine: Diabetes '[ ]'$ ; Thyroid dysfunction'[ ]'$    Past Medical History:  Diagnosis Date   Allergic rhinitis    on allergy shots, sees Dr. Pryor Ochoa   Anemia    Arthritis    B12 deficiency    Back pain    Bilateral edema of lower extremity    Cataract    CHF (congestive heart failure) (HCC)    Constipation    Edema    Gallbladder problem    GERD (gastroesophageal reflux disease)    History of stomach ulcers    Hyperlipidemia    Hypertension    Joint pain    Macular degeneration    followed by Dr Garwin Brothers at [facility]   Osteoarthritis    Other fatigue    Prediabetes    Proteinuria    RLS (restless legs syndrome)    Sciatica    Shortness of breath    Shortness of breath on exertion    Sleep apnea    Stage 3 chronic kidney disease (HCC)    Swallowing difficulty    Vitamin D deficiency     Current Outpatient Medications  Medication Sig Dispense Refill   azelastine (ASTELIN) 0.1 % nasal spray USE 2 SPRAYS IN EACH NOSTRIL DAILY 30 mL 11   bisacodyl (DULCOLAX) 5 MG EC tablet Take  5 mg by mouth daily as needed for moderate constipation.     Cholecalciferol (VITAMIN D3) 5000 units CAPS Take 1 capsule by mouth daily.     esomeprazole (NEXIUM) 20 MG capsule Take 40 mg by mouth daily at 12 noon.      ezetimibe (ZETIA) 10 MG tablet TAKE 1 TABLET(10 MG) BY MOUTH DAILY 90 tablet 2   hydrALAZINE (APRESOLINE) 25 MG tablet Take 1 tablet (25 mg total) by mouth in the morning and at bedtime. 180 tablet 3   ipratropium (ATROVENT) 0.03 % nasal spray USE 2 SPRAYS IN EACH NOSTRIL EVERY 8 HOURS AS NEEDED 90 mL 1   levocetirizine (XYZAL) 5 MG tablet Take 5 mg by mouth every evening.     montelukast (SINGULAIR) 10 MG tablet TAKE 1 TABLET(10 MG) BY MOUTH DAILY 90 tablet 0   Multiple Vitamins-Minerals (EYE VITAMINS PO) Take 2 tablets by mouth daily. Ocuvite 50+     polyethylene glycol (MIRALAX / GLYCOLAX) 17 g packet Take 17 g by mouth daily as needed.      potassium chloride SA (KLOR-CON M) 20 MEQ tablet Take 2 tablets (40 mEq total) by mouth 2 (two) times daily. 360 tablet 0   pramipexole (MIRAPEX) 0.75 MG tablet TAKE 1 TABLET(0.75 MG) BY MOUTH AT BEDTIME 90 tablet 1   spironolactone (ALDACTONE) 25 MG tablet Take 1 tablet (25 mg total) by mouth daily. 90 tablet 3   Tiotropium Bromide Monohydrate (SPIRIVA RESPIMAT) 2.5 MCG/ACT AERS Inhale 2 puffs into the lungs daily. 8 g 11   torsemide (DEMADEX) 20 MG tablet Take 2 tablets (40 mg total) by mouth 2 (two) times daily. (Patient taking differently: Take 60 mg by mouth 2 (two) times daily.) 360 tablet 0   triamcinolone (NASACORT) 55 MCG/ACT AERO nasal inhaler Place 2 sprays into the nose daily.     clotrimazole-betamethasone (LOTRISONE) cream APPLY EXTERNALLY TO THE AFFECTED AREA TWICE DAILY AS NEEDED 60 g 0   triamcinolone ointment (KENALOG) 0.5 % Apply 1 Application topically 2 (two) times daily as needed.     No current facility-administered medications for this visit.    Allergies  Allergen Reactions   Erythromycin Other (See Comments)    Other reaction(s): Headache Headache and GI upset Headache and GI upset   Losartan Nausea And Vomiting   Metoprolol Nausea And Vomiting   Simvastatin Other (See Comments)    Other reaction(s): Muscle Pain Pain lower extremities Pain lower extremities   Dexlansoprazole Diarrhea   Lisinopril Cough   Prednisone     Other reaction(s): Constipation GI upset      Social History   Socioeconomic History   Marital status: Divorced    Spouse name: Not on file   Number of children: 1   Years of education: Not on file   Highest education level: Bachelor's degree (e.g., BA, AB, BS)  Occupational History   Occupation: retired in 2001    Comment: social work Scientist, physiological  Tobacco Use   Smoking status: Former    Packs/day: 1.00    Years: 10.00    Total pack years: 10.00    Types: Cigarettes    Start date: 11/29/1965    Quit date: 11/28/1976    Years  since quitting: 46.2   Smokeless tobacco: Never  Vaping Use   Vaping Use: Never used  Substance and Sexual Activity   Alcohol use: Not Currently    Comment: rarely, at holidays   Drug use: No   Sexual activity: Not Currently  Other Topics  Concern   Not on file  Social History Narrative   Not on file   Social Determinants of Health   Financial Resource Strain: Low Risk  (12/27/2022)   Overall Financial Resource Strain (CARDIA)    Difficulty of Paying Living Expenses: Not hard at all  Food Insecurity: No Food Insecurity (12/21/2022)   Hunger Vital Sign    Worried About Running Out of Food in the Last Year: Never true    Ran Out of Food in the Last Year: Never true  Transportation Needs: No Transportation Needs (12/27/2022)   PRAPARE - Hydrologist (Medical): No    Lack of Transportation (Non-Medical): No  Physical Activity: Inactive (02/24/2022)   Exercise Vital Sign    Days of Exercise per Week: 0 days    Minutes of Exercise per Session: 0 min  Stress: No Stress Concern Present (02/24/2022)   [place]    Feeling of Stress : Not at all  Social Connections: Unknown (02/24/2022)   Social Connection and Isolation Panel [NHANES]    Frequency of Communication with Friends and Family: More than three times a week    Frequency of Social Gatherings with Friends and Family: Once a week    Attends Religious Services: Never    Marine scientist or Organizations: No    Attends Archivist Meetings: Never    Marital Status: Not on file  Intimate Partner Violence: Not At Risk (02/24/2022)   Humiliation, Afraid, Rape, and Kick questionnaire    Fear of Current or Ex-Partner: No    Emotionally Abused: No    Physically Abused: No    Sexually Abused: No      Family History  Problem Relation Age of Onset   Hyperlipidemia Mother    Hypertension Mother    Seizures Mother     Hypertension Father    Heart disease Father 57       several heart attacks   Diabetes Father        diet controlled   Myelodysplastic syndrome Sister    Prostate cancer Paternal Grandfather    Breast cancer Neg Hx    Cervical cancer Neg Hx     Vitals:   02/07/23 1332  BP: (!) 122/58  Pulse: 91  SpO2: 96%  Weight: 237 lb 12.8 oz (107.9 kg)    PHYSICAL EXAM: General: Elderly. No respiratory difficulty HEENT: normal Neck: supple. no JVD. Carotids 2+ bilat; no bruits. No lymphadenopathy or thryomegaly appreciated. Cor: PMI nondisplaced. Regular rate & rhythm. No rubs, gallops or murmurs. Lungs: clear Abdomen: obese soft, nontender, nondistended. No hepatosplenomegaly. No bruits or masses. Good bowel sounds. Extremities: no cyanosis, clubbing, rash, 1-2+ edema Neuro: alert & oriented x 3, cranial nerves grossly intact. moves all 4 extremities w/o difficulty. Affect pleasant.  ECG: Sinus rhythm 77 low volts Personally reviewed   ASSESSMENT & PLAN:  1. Chronic diastolic HF/lower extremity edema - suspect combination of diastolic HF and venous insufficiency - Echocardiogram 03/18/2021 EF 55-60% G1DD RV ok Personally reviewed - Volume status mildly elevated - Continue torsemide and spiro - Will add Jardiance 10  - Unable to use compression stockings -> will wrap legs with ACE wraps - Can consider RHC +/- Furoscix as needed - Labs today with BMET and BNP  2. OSA - Mild to moderate AHI 13.5 - refuses CPAP  3. Morbid obesity - consider [id number]  Glori Bickers, MD  2:08 PM

## 2023-02-07 NOTE — Patient Instructions (Signed)
START Jardiance '10mg'$  daily  Routine lab work today. Will notify you of abnormal results  Your provider requests you you have ace wraps  Follow up in 2-3 months  Do the following things EVERYDAY: Weigh yourself in the morning before breakfast. Write it down and keep it in a log. Take your medicines as prescribed Eat low salt foods--Limit salt (sodium) to 2000 mg per day.  Stay as active as you can everyday Limit all fluids for the day to less than 2 liters

## 2023-02-11 ENCOUNTER — Other Ambulatory Visit: Payer: Self-pay | Admitting: Family Medicine

## 2023-02-23 ENCOUNTER — Ambulatory Visit: Payer: Medicare Other | Admitting: Cardiology

## 2023-02-25 NOTE — Progress Notes (Deleted)
   I,Khloei Spiker S Karaline Buresh,acting as a Education administrator for Lavon Paganini, MD.,have documented all relevant documentation on the behalf of Lavon Paganini, MD,as directed by  Lavon Paganini, MD while in the presence of Lavon Paganini, MD.     Established patient visit   Patient: Gloria Mercer   DOB: 1941-09-23   82 y.o. Female  MRN: [id number] Visit Date: 02/28/2023  Today's healthcare provider: Lavon Paganini, MD   No chief complaint on file.  Subjective    HPI  ***  Medications: Outpatient Medications Prior to Visit  Medication Sig   azelastine (ASTELIN) 0.1 % nasal spray USE 2 SPRAYS IN EACH NOSTRIL DAILY   bisacodyl (DULCOLAX) 5 MG EC tablet Take 5 mg by mouth daily as needed for moderate constipation.   Cholecalciferol (VITAMIN D3) 5000 units CAPS Take 1 capsule by mouth daily.   clotrimazole-betamethasone (LOTRISONE) cream APPLY EXTERNALLY TO THE AFFECTED AREA TWICE DAILY AS NEEDED   empagliflozin (JARDIANCE) 10 MG TABS tablet Take 1 tablet (10 mg total) by mouth daily before breakfast.   esomeprazole (NEXIUM) 20 MG capsule Take 40 mg by mouth daily at 12 noon.    ezetimibe (ZETIA) 10 MG tablet TAKE 1 TABLET(10 MG) BY MOUTH DAILY   hydrALAZINE (APRESOLINE) 25 MG tablet Take 1 tablet (25 mg total) by mouth in the morning and at bedtime.   ipratropium (ATROVENT) 0.03 % nasal spray USE 2 SPRAYS IN EACH NOSTRIL EVERY 8 HOURS AS NEEDED   levocetirizine (XYZAL) 5 MG tablet Take 5 mg by mouth every evening.   montelukast (SINGULAIR) 10 MG tablet TAKE 1 TABLET(10 MG) BY MOUTH DAILY   Multiple Vitamins-Minerals (EYE VITAMINS PO) Take 2 tablets by mouth daily. Ocuvite 50+   polyethylene glycol (MIRALAX / GLYCOLAX) 17 g packet Take 17 g by mouth daily as needed.   potassium chloride SA (KLOR-CON M) 20 MEQ tablet Take 2 tablets (40 mEq total) by mouth 2 (two) times daily.   pramipexole (MIRAPEX) 0.75 MG tablet TAKE 1 TABLET(0.75 MG) BY MOUTH AT BEDTIME   spironolactone (ALDACTONE)  25 MG tablet Take 1 tablet (25 mg total) by mouth daily.   Tiotropium Bromide Monohydrate (SPIRIVA RESPIMAT) 2.5 MCG/ACT AERS INHALE 2 PUFFS INTO THE LUNGS DAILY   torsemide (DEMADEX) 20 MG tablet Take 2 tablets (40 mg total) by mouth 2 (two) times daily. (Patient taking differently: Take 60 mg by mouth 2 (two) times daily.)   triamcinolone (NASACORT) 55 MCG/ACT AERO nasal inhaler Place 2 sprays into the nose daily.   triamcinolone ointment (KENALOG) 0.5 % Apply 1 Application topically 2 (two) times daily as needed.   No facility-administered medications prior to visit.    Review of Systems  {Labs  Heme  Chem  Endocrine  Serology  Results Review (optional):23779}   Objective    There were no vitals taken for this visit. {Show previous vital signs (optional):23777}  Physical Exam  ***  No results found for any visits on 02/28/23.  Assessment & Plan     ***  No follow-ups on file.      {provider attestation***:1}   Lavon Paganini, MD  [facility] [phone] (phone) [phone] (fax)  [place]

## 2023-02-28 ENCOUNTER — Ambulatory Visit: Payer: Medicare Other | Admitting: Family Medicine

## 2023-03-01 ENCOUNTER — Ambulatory Visit: Payer: Medicare Other | Admitting: Family Medicine

## 2023-03-28 ENCOUNTER — Telehealth: Payer: Self-pay

## 2023-03-28 NOTE — Progress Notes (Unsigned)
Care Management & Coordination Services Pharmacy Note  03/28/2023 Name:  Gloria Mercer MRN:  [id number] DOB:  [birth year]  Summary: Patient presents for initial CCM consult.   -Patient cholesterol uncontrolled. However given patient age and polypharmacy concerns, may be reasonable to discontinue cholesterol treatment at this time.   -Patient reports uncontrolled constipations resulting in multiple ED visits. She has started dulcolax and Miralax and now her symptoms have transitioned to diarrhea. Symptoms may be related to polypharmacy concerns.   -Previously having symptoms of shortness of breath and wheezing with congestion. Symptoms better controlled with Spiriva, but reports her inhaler runs out early.   Recommendations/Changes made from today's visit: -DECREASE Dulcolax to 5 mg nightly -DECREASE Miralax to 1 capful daily as needed.  -Recommend switching pramipexole to ropinirole 0.25 mg nightly and consider discontinuing ezetimibe to minimize risk of drug-induced constipation.   -Recommend PFT testing.  Follow up plan: CPP follow-up 6 months   Subjective: Gloria Mercer is an 82 y.o. year old female who is a primary patient of [name], Marzella Schlein, MD.  The care coordination team was consulted for assistance with disease management and care coordination needs.    Engaged with patient by telephone for initial visit.  Recent office visits:  12/30/22: Patient presented to Dr. Beryle Flock for follow-up.   09/07/2022 [name], MD (PCP Office Visit) Mercer medication changes noted, Mercer orders placed, patient to follow-up in 3 months   07/22/2022  Alfredia Ferguson, PA-C (PCP Video Visit) Mercer medication changes noted, Referral to Home Health, Referral to Podiatry, Mercer follow-up noted  Recent consult visits: 01/05/23: Patient presented to Dr. Azucena Cecil (Cardiology) for follow-up. Hydralazine 50 mg twice daily, torsemide 40 mg twice daily   02/07/23: Patient presented to Dr.  Gala Romney (cardiology) for follow-up. Jardiance 10 mg daily.   10/12/2022 Kalman Jewels, DPM (Podiatry) for Follow-up- Mercer medication changes needed, Mercer orders placed, patient to follow-up prn   10/11/2022  Janine Limbo. Hyler AGNP (Nephrology) for Follow-up- Changed: Decrease Hydralazine 25 mg three times daily, Mercer orders placed, Patient to follow-up in 6 months   08/27/2022 Brain Azucena Cecil, MD (Cardiology) for Follow-up- Changed: Torsemide 60 mg twice daily from 40 mg daily, Stopped: Metolazone 2.5 mg due to patient not taking, Lab orders placed,    08/24/2022 Kalman Jewels, DMP (Podiatry) for Follow-up- I am unable to view this note   08/03/2022  Kalman Jewels, DMP (Podiatry) for Initial Consult- I am unable to view this note   Hospital visits: Admitted to the hospital on 12/16/2022 due to Constipation. Discharge date was 12/16/2022. Discharged from [facility] Emergency Department at [facility].     Admitted to the hospital on 08/12/2022 due to Constipation. Discharge date was 08/12/2022. Discharged from [facility] Emergency Department at [facility].   Objective:  Lab Results  Component Value Date   CREATININE 1.41 (H) 02/07/2023   BUN 37 (H) 02/07/2023   EGFR 43 (L) 12/28/2022   GFRNONAA 37 (L) 02/07/2023   GFRAA 64 09/29/2020   NA 138 02/07/2023   K 4.2 02/07/2023   CALCIUM 9.3 02/07/2023   CO2 28 02/07/2023   GLUCOSE 102 (H) 02/07/2023    Lab Results  Component Value Date/Time   HGBA1C 6.2 (H) 12/28/2022 10:56 AM   HGBA1C 5.9 (H) 12/25/2021 10:59 AM   MICROALBUR <3.0 03/26/2016 12:00 AM    Last diabetic Eye exam: Mercer results found for: "HMDIABEYEEXA"  Last diabetic Foot exam: Mercer results found for: "HMDIABFOOTEX"   Lab  Results  Component Value Date   CHOL 206 (H) 12/28/2022   HDL 66 12/28/2022   LDLCALC 117 (H) 12/28/2022   TRIG 130 12/28/2022   CHOLHDL 3.2 12/25/2021       Latest Ref Rng & Units 12/28/2022   10:56 AM 08/12/2022    10:37 AM 03/18/2022    9:06 AM  Hepatic Function  Total Protein 6.0 - 8.5 g/dL 6.2  6.9  6.4   Albumin 3.7 - 4.7 g/dL 4.0  3.5  3.4   AST 0 - 40 IU/L 15  20  18    ALT 0 - 32 IU/L 10  12  14    Alk Phosphatase 44 - 121 IU/L 106  91  89   Total Bilirubin 0.0 - 1.2 mg/dL 0.4  1.2  0.9     Lab Results  Component Value Date/Time   TSH 3.920 03/02/2022 10:20 AM   TSH 3.720 03/31/2021 02:35 PM   FREET4 1.49 03/02/2022 10:20 AM       Latest Ref Rng & Units 08/12/2022   10:37 AM 03/18/2022    9:06 AM 12/25/2021   10:59 AM  CBC  WBC 4.0 - 10.5 K/uL 10.0  7.3  7.7   Hemoglobin 12.0 - 15.0 g/dL 16.1  09.6  04.5   Hematocrit 36.0 - 46.0 % 37.2  36.3  37.1   Platelets 150 - 400 K/uL 270  308  244     Lab Results  Component Value Date/Time   VD25OH 57.1 03/02/2022 10:20 AM   VD25OH 45.4 12/25/2018 01:59 PM   VITAMINB12 175 (L) 12/25/2021 10:59 AM   VITAMINB12 300 12/25/2018 01:59 PM    Clinical ASCVD: Mercer  The ASCVD Risk score (Arnett DK, et al., 2019) failed to calculate for the following reasons:   The 2019 ASCVD risk score is only valid for ages 38 to 45       12/30/2022    4:10 PM 09/07/2022   11:07 AM 03/02/2022    8:48 AM  Depression screen PHQ 2/9  Decreased Interest 0 0 3  Down, Depressed, Hopeless 0 0 0  PHQ - 2 Score 0 0 3  Altered sleeping 3 0 0  Tired, decreased energy 3 0 2  Change in appetite 3 1 0  Feeling bad or failure about yourself  0 1 0  Trouble concentrating 0 0 1  Moving slowly or fidgety/restless 0 0 3  Suicidal thoughts 0 0 0  PHQ-9 Score 9 2 9   Difficult doing work/chores Not difficult at all Very difficult Very difficult     Social History   Tobacco Use  Smoking Status Former   Packs/day: 1.00   Years: 10.00   Additional pack years: 0.00   Total pack years: 10.00   Types: Cigarettes   Start date: 11/29/1965   Quit date: 11/28/1976   Years since quitting: 46.3  Smokeless Tobacco Never   BP Readings from Last 3 Encounters:  02/07/23 (!)  122/58  01/05/23 120/70  12/30/22 108/65   Pulse Readings from Last 3 Encounters:  02/07/23 91  01/05/23 77  12/30/22 79   Wt Readings from Last 3 Encounters:  02/07/23 237 lb 12.8 oz (107.9 kg)  01/05/23 234 lb 4 oz (106.3 kg)  12/30/22 231 lb 1.6 oz (104.8 kg)   BMI Readings from Last 3 Encounters:  02/07/23 44.93 kg/m  01/05/23 44.26 kg/m  12/30/22 43.67 kg/m    Allergies  Allergen Reactions   Erythromycin Other (See Comments)  Other reaction(s): Headache Headache and GI upset Headache and GI upset   Losartan Nausea And Vomiting   Metoprolol Nausea And Vomiting   Simvastatin Other (See Comments)    Other reaction(s): Muscle Pain Pain lower extremities Pain lower extremities   Dexlansoprazole Diarrhea   Lisinopril Cough   Prednisone     Other reaction(s): Constipation GI upset    Medications Reviewed Today     Reviewed by Electa Sniff, RN (Registered Nurse) on 02/07/23 at 1341  Med List Status: <None>   Medication Order Taking? Sig Documenting Provider Last Dose Status Informant  azelastine (ASTELIN) 0.1 % nasal spray [id number] Yes USE 2 SPRAYS IN EACH NOSTRIL DAILY Drubel, Lillia Abed, PA-C Taking Active   bisacodyl (DULCOLAX) 5 MG EC tablet [id number] Yes Take 5 mg by mouth daily as needed for moderate constipation. [provider] Taking Active   Cholecalciferol (VITAMIN D3) 5000 units CAPS [id number] Yes Take 1 capsule by mouth daily. [provider] Taking Active   clotrimazole-betamethasone (LOTRISONE) cream [id number]  APPLY EXTERNALLY TO THE AFFECTED AREA TWICE DAILY AS NEEDED Beryle Flock Marzella Schlein, MD  Active   esomeprazole (NEXIUM) 20 MG capsule [id number] Yes Take 40 mg by mouth daily at 12 noon.  [provider] Taking Active   ezetimibe (ZETIA) 10 MG tablet [id number] Yes TAKE 1 TABLET(10 MG) BY MOUTH DAILY [name], Marzella Schlein, MD Taking Active   hydrALAZINE (APRESOLINE) 25 MG tablet [id number] Yes Take 1 tablet (25 mg total) by  mouth in the morning and at bedtime. Debbe Odea, MD Taking Active   ipratropium (ATROVENT) 0.03 % nasal spray [id number] Yes USE 2 SPRAYS IN EACH NOSTRIL EVERY 8 HOURS AS NEEDED [name], Marzella Schlein, MD Taking Active   levocetirizine (XYZAL) 5 MG tablet [id number] Yes Take 5 mg by mouth every evening. [provider] Taking Active   montelukast (SINGULAIR) 10 MG tablet [id number] Yes TAKE 1 TABLET(10 MG) BY MOUTH DAILY [name], Marzella Schlein, MD Taking Active   Multiple Vitamins-Minerals (EYE VITAMINS PO) [id number] Yes Take 2 tablets by mouth daily. Ocuvite 50+ [provider] Taking Active   polyethylene glycol (MIRALAX / GLYCOLAX) 17 g packet [id number] Yes Take 17 g by mouth daily as needed. [provider] Taking Active   potassium chloride SA (KLOR-CON M) 20 MEQ tablet [id number] Yes Take 2 tablets (40 mEq total) by mouth 2 (two) times daily. Debbe Odea, MD Taking Active   pramipexole (MIRAPEX) 0.75 MG tablet [id number] Yes TAKE 1 TABLET(0.75 MG) BY MOUTH AT BEDTIME Erasmo Downer, MD Taking Active   spironolactone (ALDACTONE) 25 MG tablet [id number] Yes Take 1 tablet (25 mg total) by mouth daily. Debbe Odea, MD Taking Active   Tiotropium Bromide Monohydrate (SPIRIVA RESPIMAT) 2.5 MCG/ACT AERS [id number] Yes Inhale 2 puffs into the lungs daily. Erasmo Downer, MD Taking Active   torsemide [facility]) 20 MG tablet [id number] Yes Take 2 tablets (40 mg total) by mouth 2 (two) times daily.  Patient taking differently: Take 60 mg by mouth 2 (two) times daily.   Debbe Odea, MD Taking Active Self  triamcinolone (NASACORT) 55 MCG/ACT AERO nasal inhaler [id number] Yes Place 2 sprays into the nose daily. [provider] Taking Active   triamcinolone ointment (KENALOG) 0.5 % [id number]  Apply 1 Application topically 2 (two) times daily as needed. [provider]  Active             SDOH:  (Social Determinants of Health)  assessments and interventions performed: Yes SDOH Interventions  Flowsheet Row Office Visit from 12/30/2022 in [facility] Coordination from 12/27/2022 in [facility] Telephone from 12/21/2022 in [facility] Coordination Clinical Support from 02/24/2022 in [facility] Clinical Support from 09/16/2020 in [facility]  SDOH Interventions       Food Insecurity Interventions -- -- Intervention Not Indicated Intervention Not Indicated --  Housing Interventions -- -- Intervention Not Indicated Intervention Not Indicated --  Transportation Interventions -- Intervention Not Indicated Intervention Not Indicated Intervention Not Indicated --  Depression Interventions/Treatment  Currently on Treatment -- -- -- --  Financial Strain Interventions -- Intervention Not Indicated -- Intervention Not Indicated --  Physical Activity Interventions -- -- -- Patient Refused Patient Refused, Other (Comments)  [due to trouble walking]  Stress Interventions -- -- -- Intervention Not Indicated --  Social Connections Interventions -- -- -- Intervention Not Indicated --       Medication Assistance: None required.  Patient affirms current coverage meets needs.  Medication Access: Within the past 30 days, how often has patient missed a dose of medication? Misses noontime meds most days Is a pillbox or other method used to improve adherence? Mercer  Factors that may affect medication adherence? Mercer barriers identified Are meds synced by current pharmacy? Mercer  Are meds delivered by current pharmacy? Mercer  Does patient experience delays in picking up medications due to transportation concerns? Mercer   [facility] Reviewed: Is patient disadvantaged to use [facility]?: Yes  Current Rx insurance plan: BC[employer] Name and location of Current pharmacy:  [id number] [place], [place] - [address] AT [facility] [address] [place] [address] Phone: [phone] Fax: [phone]  [facility] services reviewed with patient today?: Yes  Patient requests to transfer care to [facility]?: Mercer  Reason patient declined to change pharmacies: Disadvantaged due to insurance/mail order  Compliance/Adherence/Medication fill history: Care Gaps: Tdap Covid  AWV  Star-Rating Drugs: None noted   Assessment/Plan  Heart Failure (Goal: manage symptoms and prevent exacerbations) -Controlled -Last ejection fraction: 55-60% (Date: Apr 2022) -HF type: Diastolic -NYHA Class: II (slight limitation of activity) -AHA HF Stage: C (Heart disease and symptoms present) -Current treatment: Hydralazine 25 mg twice daily  Jardiance 10 mg daily  Spironolactone 25 mg daily  Torsemide 60 mg twice daily daily  -Medications previously tried: NA  -Current home BP/HR readings: PT and home health checks, patient unaware of readings.  -Recommended to continue current medication  Hyperlipidemia: (LDL goal < 100) -Uncontrolled -Current treatment: Ezetimibe 10 mg daily  -Medications previously tried: NA  -Patient cholesterol uncontrolled. However given patient age and polypharmacy concerns, may be reasonable to discontinue cholesterol treatment at this time.  -Recommend stopping ezetimibe  Bronchitis (Goal: control symptoms and prevent exacerbations) -Not ideally controlled -Current treatment  Spiriva 2 puffs daily  -Medications previously tried: NA  -Previously having symptoms of shortness of breath and wheezing with congestion. Symptoms better controlled with Spiriva, but reports her inhaler runs out early.  -Pulmonary function testing: None -Recommend PFT testing.  -Recommended to continue current medication  Allergic Rhinitis (Goal: achieve symptom relief) -Controlled -Current treatment  Levocetirizine 5 mg nightly  Montelukast 10 mg  nightly  Nasocort nasal spray  Ipratropium nasal spray  Azelastine nasal spray  -Medications previously tried: NA  -Recommended to continue current medication  Constipation (Goal: Improve symptoms) -Uncontrolled -Current treatment  Dulcolax 10  mg daily  Miralax 1 capful 1-2 times daily.  -Medications previously tried: NA   Chronic Kidney Disease Stage 3b  -All medications assessed for renal dosing and appropriateness in chronic kidney disease. -Recommended to continue current medication  Angelena Sole, PharmD, Patsy [place], CPP  Clinical Pharmacist Practitioner  [facility] [phone]

## 2023-03-28 NOTE — Telephone Encounter (Signed)
Care Management & Coordination Services Outreach Note  03/28/2023 Name: Gloria Mercer MRN: [id number] DOB: 03-Dec-1940  Referred by: Erasmo Downer, MD  Patient had a phone appointment scheduled with clinical pharmacist today.  An unsuccessful telephone outreach was attempted today. The patient was referred to the pharmacist for assistance with medications, care management and care coordination.   Patient will NOT be penalized in any way for missing a Care Management & Coordination Services appointment. The no-show fee does not apply.  Angelena Sole, PharmD, Patsy [place], CPP  Clinical Pharmacist Practitioner  [facility] [phone]

## 2023-03-30 ENCOUNTER — Other Ambulatory Visit: Payer: Self-pay | Admitting: Family Medicine

## 2023-04-01 ENCOUNTER — Ambulatory Visit: Payer: Medicare Other | Attending: Cardiology | Admitting: Cardiology

## 2023-04-01 ENCOUNTER — Encounter: Payer: Self-pay | Admitting: Cardiology

## 2023-04-01 VITALS — BP 116/60 | HR 81 | Ht 61.0 in | Wt 244.0 lb

## 2023-04-01 DIAGNOSIS — I509 Heart failure, unspecified: Secondary | ICD-10-CM | POA: Diagnosis not present

## 2023-04-01 DIAGNOSIS — I10 Essential (primary) hypertension: Secondary | ICD-10-CM | POA: Diagnosis not present

## 2023-04-01 MED ORDER — TORSEMIDE 100 MG PO TABS: 150.0000 mg | ORAL_TABLET | Freq: Every day | ORAL | 3 refills | Status: DC

## 2023-04-01 NOTE — Patient Instructions (Signed)
Medication Instructions:  Your physician has recommended you make the following change in your medication:   START - torsemide (DEMADEX) 100 MG tablet - Take 1.5 tablets (150 mg total) by mouth daily (1 tablet in the AM and 0.5 tablet in the PM)  STOP - hydrALAZINE (APRESOLINE) 25 MG tablet  *If you need a refill on your cardiac medications before your next appointment, please call your pharmacy*  Lab Work: [facility] with nephrology If you have labs (blood work) drawn today and your tests are completely normal, you will receive your results only by: MyChart Message (if you have MyChart) OR A paper copy in the mail If you have any lab test that is abnormal or we need to change your treatment, we will call you to review the results.   Testing/Procedures: -None ordered   Follow-Up: At [facility], you and your health needs are our priority.  As part of our continuing mission to provide you with exceptional heart care, we have created designated Provider Care Teams.  These Care Teams include your primary Cardiologist (physician) and Advanced Practice Providers (APPs -  Physician Assistants and Nurse Practitioners) who all work together to provide you with the care you need, when you need it.  We recommend signing up for the patient portal called "MyChart".  Sign up information is provided on this After Visit Summary.  MyChart is used to connect with patients for Virtual Visits (Telemedicine).  Patients are able to view lab/test results, encounter notes, upcoming appointments, etc.  Non-urgent messages can be sent to your provider as well.   To learn more about what you can do with MyChart, go to [email or web address].    Your next appointment:   4 - 6 weeks   Provider:   Debbe Odea, MD    Other Instructions -None

## 2023-04-01 NOTE — Progress Notes (Signed)
Cardiology Office Note:    Date:  04/01/2023   ID:  Gloria Mercer, DOB August 03, 1941, MRN [id number]  PCP:  Erasmo Downer, MD   [facility]  Cardiologist:  Debbe Odea, MD  Advanced Practice Provider:  No care team member to display Electrophysiologist:  None       Referring MD: Erasmo Downer, MD   Chief Complaint  Patient presents with   Follow-up    6 week follow up. Patient reports that the medication change has helped but she is still swelling.  Meds reviewed verbally with patient.      History of Present Illness:    Gloria Mercer is a 82 y.o. female with a hx of HFpEF, hypertension, obesity, CKD presents for follow-up.   Torsemide previously increased to 60 mg twice daily, edema noted to improve.  Scheduled to get blood work and nephrology visit next week.  Still has edema, previously did not tolerate metolazone due to constipation.  Blood pressures have been low normal at home.  Endorses eating lots of sweets.  Prior notes Echocardiogram 03/18/2021 normal systolic function, impaired relaxation, EF 55 to 60%. Not tolerant to several BP meds including losartan, metoprolol, lisinopril. Intolerant to metolazone, developed significant constipation.   Past Medical History:  Diagnosis Date   Allergic rhinitis    on allergy shots, sees Dr. Andee Poles   Anemia    Arthritis    B12 deficiency    Back pain    Bilateral edema of lower extremity    Cataract    CHF (congestive heart failure) (HCC)    Constipation    Edema    Gallbladder problem    GERD (gastroesophageal reflux disease)    History of stomach ulcers    Hyperlipidemia    Hypertension    Joint pain    Macular degeneration    followed by Dr Cherly Hensen at [facility]   Osteoarthritis    Other fatigue    Prediabetes    Proteinuria    RLS (restless legs syndrome)    Sciatica    Shortness of breath    Shortness of breath on exertion    Sleep apnea    Stage 3 chronic kidney  disease (HCC)    Swallowing difficulty    Vitamin D deficiency     Past Surgical History:  Procedure Laterality Date   CHOLECYSTECTOMY  2002   TUBAL LIGATION  1978    Current Medications: Current Meds  Medication Sig   azelastine (ASTELIN) 0.1 % nasal spray USE 2 SPRAYS IN EACH NOSTRIL DAILY   bisacodyl (DULCOLAX) 5 MG EC tablet Take 5 mg by mouth daily as needed for moderate constipation.   Cholecalciferol (VITAMIN D3) 5000 units CAPS Take 1 capsule by mouth daily.   clotrimazole-betamethasone (LOTRISONE) cream APPLY EXTERNALLY TO THE AFFECTED AREA TWICE DAILY AS NEEDED   empagliflozin (JARDIANCE) 10 MG TABS tablet Take 1 tablet (10 mg total) by mouth daily before breakfast.   esomeprazole (NEXIUM) 20 MG capsule Take 40 mg by mouth daily at 12 noon.    ezetimibe (ZETIA) 10 MG tablet TAKE 1 TABLET(10 MG) BY MOUTH DAILY   ipratropium (ATROVENT) 0.03 % nasal spray USE 2 SPRAYS IN EACH NOSTRIL EVERY 8 HOURS AS NEEDED   levocetirizine (XYZAL) 5 MG tablet Take 5 mg by mouth every evening.   montelukast (SINGULAIR) 10 MG tablet TAKE 1 TABLET(10 MG) BY MOUTH DAILY   Multiple Vitamins-Minerals (EYE VITAMINS PO) Take 2 tablets by mouth daily.  Ocuvite 50+   polyethylene glycol (MIRALAX / GLYCOLAX) 17 g packet Take 17 g by mouth daily as needed.   potassium chloride SA (KLOR-CON M) 20 MEQ tablet Take 2 tablets (40 mEq total) by mouth 2 (two) times daily.   pramipexole (MIRAPEX) 0.75 MG tablet TAKE 1 TABLET(0.75 MG) BY MOUTH AT BEDTIME   spironolactone (ALDACTONE) 25 MG tablet Take 1 tablet (25 mg total) by mouth daily.   Tiotropium Bromide Monohydrate (SPIRIVA RESPIMAT) 2.5 MCG/ACT AERS INHALE 2 PUFFS INTO THE LUNGS DAILY   triamcinolone (NASACORT) 55 MCG/ACT AERO nasal inhaler Place 2 sprays into the nose daily.   triamcinolone ointment (KENALOG) 0.5 % Apply 1 Application topically 2 (two) times daily as needed.   [DISCONTINUED] hydrALAZINE (APRESOLINE) 25 MG tablet Take 1 tablet (25 mg total)  by mouth in the morning and at bedtime.   [DISCONTINUED] torsemide (DEMADEX) 20 MG tablet Take 2 tablets (40 mg total) by mouth 2 (two) times daily.     Allergies:   Erythromycin, Losartan, Metoprolol, Simvastatin, Dexlansoprazole, Lisinopril, and Prednisone   Social History   Socioeconomic History   Marital status: Divorced    Spouse name: Not on file   Number of children: 1   Years of education: Not on file   Highest education level: Bachelor's degree (e.g., BA, AB, BS)  Occupational History   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, theatre/television/film  Tobacco Use   Smoking status: Former    Packs/day: 1.00    Years: 10.00    Additional pack years: 0.00    Total pack years: 10.00    Types: Cigarettes    Start date: 11/29/1965    Quit date: 11/28/1976    Years since quitting: 46.3   Smokeless tobacco: Never  Vaping Use   Vaping Use: Never used  Substance and Sexual Activity   Alcohol use: Not Currently    Comment: rarely, at holidays   Drug use: No   Sexual activity: Not Currently  Other Topics Concern   Not on file  Social History Narrative   Not on file   Social Determinants of Health   Financial Resource Strain: Low Risk  (12/27/2022)   Overall Financial Resource Strain (CARDIA)    Difficulty of Paying Living Expenses: Not hard at all  Food Insecurity: No Food Insecurity (12/21/2022)   Hunger Vital Sign    Worried About Running Out of Food in the Last Year: Never true    Ran Out of Food in the Last Year: Never true  Transportation Needs: No Transportation Needs (12/27/2022)   PRAPARE - Administrator, Civil Service (Medical): No    Lack of Transportation (Non-Medical): No  Physical Activity: Inactive (02/24/2022)   Exercise Vital Sign    Days of Exercise per Week: 0 days    Minutes of Exercise per Session: 0 min  Stress: No Stress Concern Present (02/24/2022)   [employer] of Occupational Health - Occupational Stress Questionnaire     Feeling of Stress : Not at all  Social Connections: Unknown (02/24/2022)   Social Connection and Isolation Panel [NHANES]    Frequency of Communication with Friends and Family: More than three times a week    Frequency of Social Gatherings with Friends and Family: Once a week    Attends Religious Services: Never    Database administrator or Organizations: No    Attends Banker Meetings: Never    Marital Status: Not on file  Family History: The patient's family history includes Diabetes in her father; Heart disease (age of onset: 45) in her father; Hyperlipidemia in her mother; Hypertension in her father and mother; Myelodysplastic syndrome in her sister; Prostate cancer in her paternal grandfather; Seizures in her mother. There is no history of Breast cancer or Cervical cancer.  ROS:   Please see the history of present illness.     All other systems reviewed and are negative.  EKGs/Labs/Other Studies Reviewed:    The following studies were reviewed today:   EKG:  EKG not ordered today.    Recent Labs: 08/12/2022: Hemoglobin 12.8; Platelets 270 12/28/2022: ALT 10 02/07/2023: B Natriuretic Peptide 28.5; BUN 37; Creatinine, Ser 1.41; Potassium 4.2; Sodium 138  Recent Lipid Panel    Component Value Date/Time   CHOL 206 (H) 12/28/2022 1056   TRIG 130 12/28/2022 1056   HDL 66 12/28/2022 1056   CHOLHDL 3.2 12/25/2021 1059   LDLCALC 117 (H) 12/28/2022 1056     Risk Assessment/Calculations:      Physical Exam:    VS:  BP 116/60 (BP Location: Right Wrist, Patient Position: Sitting, Cuff Size: Normal)   Pulse 81   Ht 5\' 1"  (1.549 m)   Wt 244 lb (110.7 kg)   SpO2 97%   BMI 46.10 kg/m     Wt Readings from Last 3 Encounters:  04/01/23 244 lb (110.7 kg)  02/07/23 237 lb 12.8 oz (107.9 kg)  01/05/23 234 lb 4 oz (106.3 kg)     GEN:  Well nourished, well developed in no acute distress HEENT: Normal NECK: Unable to assess JVD due to body habitus CARDIAC: RRR,  no murmurs, rubs, gallops RESPIRATORY: Decreased breath sounds at bases ABDOMEN: Soft, non-tender, non-distended MUSCULOSKELETAL:  2+ edema; right foot in Unna boot SKIN: Warm and dry NEUROLOGIC:  Alert and oriented x 3 PSYCHIATRIC:  Normal affect   ASSESSMENT:    1. Heart failure, unspecified HF chronicity, unspecified heart failure type (HCC)   2. Primary hypertension   3. Morbid obesity (HCC)    PLAN:    In order of problems listed above:  HFpEF, bilateral lower extremity edema.  Increase torsemide to 100 mg a.m., 50 mg p.m.Marland Kitchen  Check BMP next week.  Continue Aldactone. Hypertension, BP controlled/low normal.  Stop hydralazine, continue Aldactone. Morbid obesity, advised to cut back on sweets.  Low-calorie diet, recommended  Follow-up in 6 weeks   Medication Adjustments/Labs and Tests Ordered: Current medicines are reviewed at length with the patient today.  Concerns regarding medicines are outlined above.  No orders of the defined types were placed in this encounter.   Meds ordered this encounter  Medications   torsemide (DEMADEX) 100 MG tablet    Sig: Take 1.5 tablets (150 mg total) by mouth daily.    Dispense:  135 tablet    Refill:  3      Patient Instructions  Medication Instructions:  Your physician has recommended you make the following change in your medication:   START - torsemide (DEMADEX) 100 MG tablet - Take 1.5 tablets (150 mg total) by mouth daily (1 tablet in the AM and 0.5 tablet in the PM)  STOP - hydrALAZINE (APRESOLINE) 25 MG tablet  *If you need a refill on your cardiac medications before your next appointment, please call your pharmacy*  Lab Work: [facility] with nephrology If you have labs (blood work) drawn today and your tests are completely normal, you will receive your results only by: MyChart Message (if you  have MyChart) OR A paper copy in the mail If you have any lab test that is abnormal or we need to change your treatment, we will call  you to review the results.   Testing/Procedures: -None ordered   Follow-Up: At [facility], you and your health needs are our priority.  As part of our continuing mission to provide you with exceptional heart care, we have created designated Provider Care Teams.  These Care Teams include your primary Cardiologist (physician) and Advanced Practice Providers (APPs -  Physician Assistants and Nurse Practitioners) who all work together to provide you with the care you need, when you need it.  We recommend signing up for the patient portal called "MyChart".  Sign up information is provided on this After Visit Summary.  MyChart is used to connect with patients for Virtual Visits (Telemedicine).  Patients are able to view lab/test results, encounter notes, upcoming appointments, etc.  Non-urgent messages can be sent to your provider as well.   To learn more about what you can do with MyChart, go to [email or web address].    Your next appointment:   4 - 6 weeks   Provider:   Debbe Odea, MD    Other Instructions -None    Signed, Debbe Odea, MD  04/01/2023 3:55 PM    [facility]

## 2023-04-04 ENCOUNTER — Telehealth: Payer: Self-pay

## 2023-04-04 NOTE — Progress Notes (Unsigned)
Care Coordination Pharmacy Assistant   Name: Gloria Mercer  MRN: [id number] DOB: 1941/04/24  Reason for Encounter: Reschedule misses appointment   05/06  I tried contacting the patient to reschedule her missed appointment with Angelena Sole, CPP on 04/29, but was unable to LVM due to her VM not being set up.  Recent office visits:  ***  Recent consult visits:  [facility] visits:  {Hospital DC Yes/No:[id number]}  Medications: Outpatient Encounter Medications as of 04/04/2023  Medication Sig   azelastine (ASTELIN) 0.1 % nasal spray USE 2 SPRAYS IN EACH NOSTRIL DAILY   bisacodyl (DULCOLAX) 5 MG EC tablet Take 5 mg by mouth daily as needed for moderate constipation.   Cholecalciferol (VITAMIN D3) 5000 units CAPS Take 1 capsule by mouth daily.   clotrimazole-betamethasone (LOTRISONE) cream APPLY EXTERNALLY TO THE AFFECTED AREA TWICE DAILY AS NEEDED   empagliflozin (JARDIANCE) 10 MG TABS tablet Take 1 tablet (10 mg total) by mouth daily before breakfast.   esomeprazole (NEXIUM) 20 MG capsule Take 40 mg by mouth daily at 12 noon.    ezetimibe (ZETIA) 10 MG tablet TAKE 1 TABLET(10 MG) BY MOUTH DAILY   ipratropium (ATROVENT) 0.03 % nasal spray USE 2 SPRAYS IN EACH NOSTRIL EVERY 8 HOURS AS NEEDED   levocetirizine (XYZAL) 5 MG tablet Take 5 mg by mouth every evening.   montelukast (SINGULAIR) 10 MG tablet TAKE 1 TABLET(10 MG) BY MOUTH DAILY   Multiple Vitamins-Minerals (EYE VITAMINS PO) Take 2 tablets by mouth daily. Ocuvite 50+   polyethylene glycol (MIRALAX / GLYCOLAX) 17 g packet Take 17 g by mouth daily as needed.   potassium chloride SA (KLOR-CON M) 20 MEQ tablet Take 2 tablets (40 mEq total) by mouth 2 (two) times daily.   pramipexole (MIRAPEX) 0.75 MG tablet TAKE 1 TABLET(0.75 MG) BY MOUTH AT BEDTIME   spironolactone (ALDACTONE) 25 MG tablet Take 1 tablet (25 mg total) by mouth daily.   Tiotropium Bromide Monohydrate (SPIRIVA RESPIMAT) 2.5 MCG/ACT AERS INHALE 2 PUFFS INTO THE LUNGS  DAILY   torsemide (DEMADEX) 100 MG tablet Take 1.5 tablets (150 mg total) by mouth daily.   triamcinolone (NASACORT) 55 MCG/ACT AERO nasal inhaler Place 2 sprays into the nose daily.   triamcinolone ointment (KENALOG) 0.5 % Apply 1 Application topically 2 (two) times daily as needed.   No facility-administered encounter medications on file as of 04/04/2023.   Adelene Idler, CPA/CMA Clinical Pharmacist Assistant Phone: [phone]

## 2023-04-07 DIAGNOSIS — I509 Heart failure, unspecified: Secondary | ICD-10-CM | POA: Diagnosis not present

## 2023-04-07 DIAGNOSIS — E668 Other obesity: Secondary | ICD-10-CM | POA: Diagnosis not present

## 2023-04-07 DIAGNOSIS — N1832 Chronic kidney disease, stage 3b: Secondary | ICD-10-CM | POA: Diagnosis not present

## 2023-04-07 DIAGNOSIS — I10 Essential (primary) hypertension: Secondary | ICD-10-CM | POA: Diagnosis not present

## 2023-04-11 ENCOUNTER — Encounter: Payer: Medicare Other | Admitting: Internal Medicine

## 2023-04-11 DIAGNOSIS — K5900 Constipation, unspecified: Secondary | ICD-10-CM | POA: Diagnosis not present

## 2023-04-11 DIAGNOSIS — R609 Edema, unspecified: Secondary | ICD-10-CM | POA: Diagnosis not present

## 2023-04-11 DIAGNOSIS — R600 Localized edema: Secondary | ICD-10-CM | POA: Diagnosis not present

## 2023-04-11 DIAGNOSIS — N1832 Chronic kidney disease, stage 3b: Secondary | ICD-10-CM | POA: Diagnosis not present

## 2023-04-11 DIAGNOSIS — I509 Heart failure, unspecified: Secondary | ICD-10-CM | POA: Diagnosis not present

## 2023-04-11 DIAGNOSIS — I10 Essential (primary) hypertension: Secondary | ICD-10-CM | POA: Diagnosis not present

## 2023-04-19 ENCOUNTER — Encounter: Payer: Self-pay | Admitting: Family Medicine

## 2023-04-19 ENCOUNTER — Ambulatory Visit (INDEPENDENT_AMBULATORY_CARE_PROVIDER_SITE_OTHER): Payer: Medicare Other | Admitting: Family Medicine

## 2023-04-19 ENCOUNTER — Ambulatory Visit: Payer: Medicare Other | Admitting: Family Medicine

## 2023-04-19 VITALS — BP 122/65 | HR 79 | Temp 97.8°F | Resp 12 | Wt 233.9 lb

## 2023-04-19 DIAGNOSIS — M26622 Arthralgia of left temporomandibular joint: Secondary | ICD-10-CM | POA: Diagnosis not present

## 2023-04-19 DIAGNOSIS — E559 Vitamin D deficiency, unspecified: Secondary | ICD-10-CM

## 2023-04-19 DIAGNOSIS — R7303 Prediabetes: Secondary | ICD-10-CM | POA: Diagnosis not present

## 2023-04-19 DIAGNOSIS — M48061 Spinal stenosis, lumbar region without neurogenic claudication: Secondary | ICD-10-CM | POA: Diagnosis not present

## 2023-04-19 DIAGNOSIS — G894 Chronic pain syndrome: Secondary | ICD-10-CM | POA: Diagnosis not present

## 2023-04-19 DIAGNOSIS — R5382 Chronic fatigue, unspecified: Secondary | ICD-10-CM

## 2023-04-19 DIAGNOSIS — I10 Essential (primary) hypertension: Secondary | ICD-10-CM

## 2023-04-19 DIAGNOSIS — D510 Vitamin B12 deficiency anemia due to intrinsic factor deficiency: Secondary | ICD-10-CM | POA: Diagnosis not present

## 2023-04-19 DIAGNOSIS — M159 Polyosteoarthritis, unspecified: Secondary | ICD-10-CM

## 2023-04-19 DIAGNOSIS — G8929 Other chronic pain: Secondary | ICD-10-CM

## 2023-04-19 DIAGNOSIS — M25561 Pain in right knee: Secondary | ICD-10-CM

## 2023-04-19 DIAGNOSIS — M65332 Trigger finger, left middle finger: Secondary | ICD-10-CM

## 2023-04-19 DIAGNOSIS — M150 Primary generalized (osteo)arthritis: Secondary | ICD-10-CM

## 2023-04-19 MED ORDER — PREGABALIN 25 MG PO CAPS
25.0000 mg | ORAL_CAPSULE | Freq: Two times a day (BID) | ORAL | 1 refills | Status: AC
Start: 2023-04-19 — End: ?

## 2023-04-19 NOTE — Progress Notes (Signed)
I,Sulibeya S Dimas,acting as a Neurosurgeon for [name], MD.,have documented all relevant documentation on the behalf of [name], MD,as directed by  [name], MD while in the presence of [name], MD.     Established patient visit   Patient: Gloria Mercer   DOB: December 17, 1940   82 y.o. Female  MRN: [id number] Visit Date: 04/19/2023  Today's healthcare provider: [name], MD   No chief complaint on file.  Subjective    HPI  Patient C/O pain all over her body x few weeks since 03/01/23. She reports he is having extreme fatigue with her body pain. She reports hip pain wakes her up from sleep. She reports she was told to only take Tylenol for pain. She reports taking 500 mg tylenol as needed. She reports tylenol does not help with pain.   Knees, hips, back, neck, shoulders (worse with the way she is sleeping), hands  L middle finger feels like a trigger finger. R hand dominant  Hurts behind L ear - worse at night  Medications: Outpatient Medications Prior to Visit  Medication Sig   azelastine (ASTELIN) 0.1 % nasal spray USE 2 SPRAYS IN EACH NOSTRIL DAILY   bisacodyl (DULCOLAX) 5 MG EC tablet Take 5 mg by mouth daily as needed for moderate constipation.   Cholecalciferol (VITAMIN D3) 5000 units CAPS Take 1 capsule by mouth daily.   clotrimazole-betamethasone (LOTRISONE) cream APPLY EXTERNALLY TO THE AFFECTED AREA TWICE DAILY AS NEEDED   empagliflozin (JARDIANCE) 10 MG TABS tablet Take 1 tablet (10 mg total) by mouth daily before breakfast.   esomeprazole (NEXIUM) 20 MG capsule Take 40 mg by mouth daily at 12 noon.    ezetimibe (ZETIA) 10 MG tablet TAKE 1 TABLET(10 MG) BY MOUTH DAILY   ipratropium (ATROVENT) 0.03 % nasal spray USE 2 SPRAYS IN EACH NOSTRIL EVERY 8 HOURS AS NEEDED   levocetirizine (XYZAL) 5 MG tablet Take 5 mg by mouth every evening.   metolazone (ZAROXOLYN) 2.5 MG tablet Take 2.5 mg by mouth daily.   montelukast (SINGULAIR) 10  MG tablet TAKE 1 TABLET(10 MG) BY MOUTH DAILY   Multiple Vitamins-Minerals (EYE VITAMINS PO) Take 2 tablets by mouth daily. Ocuvite 50+   polyethylene glycol (MIRALAX / GLYCOLAX) 17 g packet Take 17 g by mouth daily as needed.   potassium chloride SA (KLOR-CON M) 20 MEQ tablet Take 2 tablets (40 mEq total) by mouth 2 (two) times daily.   pramipexole (MIRAPEX) 0.75 MG tablet TAKE 1 TABLET(0.75 MG) BY MOUTH AT BEDTIME   spironolactone (ALDACTONE) 25 MG tablet Take 1 tablet (25 mg total) by mouth daily.   Tiotropium Bromide Monohydrate (SPIRIVA RESPIMAT) 2.5 MCG/ACT AERS INHALE 2 PUFFS INTO THE LUNGS DAILY   torsemide (DEMADEX) 100 MG tablet Take 1.5 tablets (150 mg total) by mouth daily.   triamcinolone (NASACORT) 55 MCG/ACT AERO nasal inhaler Place 2 sprays into the nose daily.   triamcinolone ointment (KENALOG) 0.5 % Apply 1 Application topically 2 (two) times daily as needed.   No facility-administered medications prior to visit.    Review of Systems  Constitutional:  Positive for appetite change and fatigue.  HENT:  Positive for ear pain.   Eyes:  Positive for visual disturbance.  Respiratory:  Positive for shortness of breath. Negative for cough and chest tightness.   Cardiovascular:  Positive for leg swelling. Negative for chest pain.  Gastrointestinal:  Positive for abdominal pain, diarrhea and nausea. Negative for blood in stool, constipation and vomiting.  Musculoskeletal:  Positive for myalgias and neck pain.  Neurological:  Positive for weakness and headaches.       Objective    BP 122/65 (BP Location: Left Arm, Patient Position: Sitting, Cuff Size: Large)   Pulse 79   Temp 97.8 F (36.6 C) (Temporal)   Resp 12   Wt 233 lb 14.4 oz (106.1 kg)   SpO2 99%   BMI 44.20 kg/m  BP Readings from Last 3 Encounters:  04/19/23 122/65  04/01/23 116/60  02/07/23 (!) 122/58   Wt Readings from Last 3 Encounters:  04/19/23 233 lb 14.4 oz (106.1 kg)  04/01/23 244 lb (110.7 kg)   02/07/23 237 lb 12.8 oz (107.9 kg)      Physical Exam Vitals reviewed.  Constitutional:      General: She is not in acute distress.    Appearance: She is well-developed. She is not diaphoretic.  HENT:     Head: Normocephalic and atraumatic.  Eyes:     General: No scleral icterus.    Conjunctiva/sclera: Conjunctivae normal.  Neck:     Thyroid: No thyromegaly.  Cardiovascular:     Rate and Rhythm: Normal rate and regular rhythm.     Heart sounds: Normal heart sounds.  Pulmonary:     Effort: Pulmonary effort is normal. No respiratory distress.     Breath sounds: Normal breath sounds. No wheezing, rhonchi or rales.  Musculoskeletal:     Cervical back: Neck supple.     Right lower leg: Edema present.     Left lower leg: Edema present.     Comments: Left middle finger trigger finger Diffuse pain over knees Walks with walker Antalgic gait  Lymphadenopathy:     Cervical: No cervical adenopathy.  Skin:    General: Skin is warm and dry.     Findings: No rash.  Neurological:     Mental Status: She is alert and oriented to person, place, and time. Mental status is at baseline.  Psychiatric:        Mood and Affect: Mood normal.        Behavior: Behavior normal.       No results found for any visits on 04/19/23.  Assessment & Plan     Problem List Items Addressed This Visit       Cardiovascular and Mediastinum   Essential hypertension    Well controlled Continue current medications  reviewed metabolic panel      Relevant Medications   metolazone (ZAROXOLYN) 2.5 MG tablet     Musculoskeletal and Integument   Primary osteoarthritis involving multiple joints    Longstanding osteoarthritis multiple joints that is affecting ADLs and quality of life Chronic pain resulting from this Not a candidate for knee replacements due to obesity Has tried injections previously without improvement      Arthralgia of left temporomandibular joint    New problem Has had this  previously on the right side Now bothersome on the left side as well Discussed symptomatic management      Trigger middle finger of left hand    New problem Referral to hand surgery for further evaluation and management      Relevant Orders   Ambulatory referral to Hand Surgery     Other   Morbid obesity (HCC)    Discussed importance of healthy weight management Discussed diet and exercise Movement is limited by osteoarthritis and chronic pain as above      Relevant Orders   TSH   PA (pernicious anemia)  Normal CBC recently at nephrology Recheck B12 level      Relevant Orders   B12   Prediabetes    Recommend low carb diet Recheck A1c       Relevant Orders   Hemoglobin A1c   Vitamin D deficiency    Continue supplement Recheck level       Relevant Orders   VITAMIN D 25 Hydroxy (Vit-D Deficiency, Fractures)   Chronic pain of both knees   Relevant Medications   pregabalin (LYRICA) 25 MG capsule   Spinal stenosis of lumbar region without neurogenic claudication - Primary    Longstanding problem with chronic pain Has been evaluated by Ortho previously Symptomatic management as below for chronic pain      Chronic pain syndrome    Longstanding issue Patient has failed Cymbalta as it did not provide any relief She has tried gabapentin and had excessive drowsiness Trial of low-dose Lyrica today If she tolerates well, we can dose titrate up If patient is unable to tolerate this, we may need to consider opioid therapy versus pain management referral Patient is not a candidate for NSAIDs given her CHF      Relevant Medications   pregabalin (LYRICA) 25 MG capsule   Chronic fatigue    Longstanding, but acutely worsening Will check Vit D, B12, TSH to evaluate for any underlying causes  Normal CBC and BMP      Relevant Orders   TSH     Return in about 4 weeks (around 05/17/2023).      I, [name], MD, have reviewed all documentation for this  visit. The documentation on 04/19/23 for the exam, diagnosis, procedures, and orders are all accurate and complete.   Stella Encarnacion, Marzella Schlein, MD, MPH [facility]

## 2023-04-19 NOTE — Assessment & Plan Note (Signed)
Normal CBC recently at nephrology Recheck B12 level

## 2023-04-19 NOTE — Assessment & Plan Note (Signed)
Recommend low carb diet °Recheck A1c  °

## 2023-04-19 NOTE — Assessment & Plan Note (Signed)
New problem Has had this previously on the right side Now bothersome on the left side as well Discussed symptomatic management

## 2023-04-19 NOTE — Assessment & Plan Note (Signed)
Discussed importance of healthy weight management Discussed diet and exercise Movement is limited by osteoarthritis and chronic pain as above

## 2023-04-19 NOTE — Assessment & Plan Note (Signed)
Well controlled Continue current medications  reviewed metabolic panel

## 2023-04-19 NOTE — Assessment & Plan Note (Signed)
Longstanding osteoarthritis multiple joints that is affecting ADLs and quality of life Chronic pain resulting from this Not a candidate for knee replacements due to obesity Has tried injections previously without improvement

## 2023-04-19 NOTE — Assessment & Plan Note (Addendum)
Longstanding, but acutely worsening Will check Vit D, B12, TSH to evaluate for any underlying causes  Normal CBC and BMP

## 2023-04-19 NOTE — Assessment & Plan Note (Signed)
Longstanding problem with chronic pain Has been evaluated by Ortho previously Symptomatic management as below for chronic pain

## 2023-04-19 NOTE — Assessment & Plan Note (Signed)
Longstanding issue Patient has failed Cymbalta as it did not provide any relief She has tried gabapentin and had excessive drowsiness Trial of low-dose Lyrica today If she tolerates well, we can dose titrate up If patient is unable to tolerate this, we may need to consider opioid therapy versus pain management referral Patient is not a candidate for NSAIDs given her CHF

## 2023-04-19 NOTE — Assessment & Plan Note (Signed)
New problem Referral to hand surgery for further evaluation and management

## 2023-04-19 NOTE — Assessment & Plan Note (Signed)
Continue supplement Recheck level 

## 2023-04-20 LAB — HEMOGLOBIN A1C
Est. average glucose Bld gHb Est-mCnc: 140 mg/dL
Hgb A1c MFr Bld: 6.5 % — ABNORMAL HIGH (ref 4.8–5.6)

## 2023-04-20 LAB — VITAMIN D 25 HYDROXY (VIT D DEFICIENCY, FRACTURES): Vit D, 25-Hydroxy: 73.4 ng/mL (ref 30.0–100.0)

## 2023-04-20 LAB — VITAMIN B12: Vitamin B-12: 544 pg/mL (ref 232–1245)

## 2023-04-20 LAB — TSH: TSH: 2.83 u[IU]/mL (ref 0.450–4.500)

## 2023-04-22 DIAGNOSIS — H353212 Exudative age-related macular degeneration, right eye, with inactive choroidal neovascularization: Secondary | ICD-10-CM | POA: Diagnosis not present

## 2023-04-22 DIAGNOSIS — H353122 Nonexudative age-related macular degeneration, left eye, intermediate dry stage: Secondary | ICD-10-CM | POA: Diagnosis not present

## 2023-05-02 ENCOUNTER — Ambulatory Visit (HOSPITAL_BASED_OUTPATIENT_CLINIC_OR_DEPARTMENT_OTHER): Payer: Medicare Other | Admitting: Internal Medicine

## 2023-05-02 ENCOUNTER — Other Ambulatory Visit
Admission: RE | Admit: 2023-05-02 | Discharge: 2023-05-02 | Disposition: A | Discharge status: Home / Self Care | Payer: Medicare Other | Source: Ambulatory Visit | Attending: Internal Medicine | Admitting: Internal Medicine

## 2023-05-02 VITALS — BP 109/57 | HR 92 | Wt 239.0 lb

## 2023-05-02 DIAGNOSIS — N183 Chronic kidney disease, stage 3 unspecified: Secondary | ICD-10-CM

## 2023-05-02 DIAGNOSIS — I5043 Acute on chronic combined systolic (congestive) and diastolic (congestive) heart failure: Secondary | ICD-10-CM | POA: Diagnosis not present

## 2023-05-02 LAB — BASIC METABOLIC PANEL
Anion gap: 15 (ref 5–15)
BUN: 51 mg/dL — ABNORMAL HIGH (ref 8–23)
CO2: 30 mmol/L (ref 22–32)
Calcium: 9.8 mg/dL (ref 8.9–10.3)
Chloride: 93 mmol/L — ABNORMAL LOW (ref 98–111)
Creatinine, Ser: 1.67 mg/dL — ABNORMAL HIGH (ref 0.44–1.00)
GFR, Estimated: 30 mL/min — ABNORMAL LOW (ref >60)
Glucose, Bld: 122 mg/dL — ABNORMAL HIGH (ref 70–99)
Potassium: 4.7 mmol/L (ref 3.5–5.1)
Sodium: 138 mmol/L (ref 135–145)

## 2023-05-02 LAB — BRAIN NATRIURETIC PEPTIDE: B Natriuretic Peptide: 21.4 pg/mL (ref 0.0–100.0)

## 2023-05-02 NOTE — Patient Instructions (Signed)
Medication Changes:  We will order furoscix today. Please wait for their call.   Lab Work:  Labs done today, your results will be available in MyChart, we will contact you for abnormal readings.   Testing/Procedures:  Your physician has requested that you have an echocardiogram. Echocardiography is a painless test that uses sound waves to create images of your heart. It provides your doctor with information about the size and shape of your heart and how well your heart's chambers and valves are working. This procedure takes approximately one hour. There are no restrictions for this procedure. Please do NOT wear cologne, perfume, aftershave, or lotions (deodorant is allowed). Please arrive 15 minutes prior to your appointment time.   Special Instructions // Education:  Do the following things EVERYDAY: Weigh yourself in the morning before breakfast. Write it down and keep it in a log. Take your medicines as prescribed Eat low salt foods--Limit salt (sodium) to 2000 mg per day.  Stay as active as you can everyday Limit all fluids for the day to less than 2 liters   Follow-Up in: follow up in one month with your ECHO.    If you have any questions or concerns before your next appointment please send [place] a message through [facility] or call our office at [phone] Monday-Friday 8 am-5 pm.   If you have an urgent need after hours on the weekend please call your Primary Cardiologist or the [facility] in [place] at [phone].

## 2023-05-02 NOTE — Progress Notes (Signed)
ADVANCED HF CLINIC CONSULT NOTE  Referring Physician: Erasmo Downer, MD Primary Care: Erasmo Downer, MD Primary Cardiologist: Debbe Odea, MD  HPI:  Gloria Mercer is a 82 y.o. female with a hx of HFpEF, hypertension, obesity, CKD 3b.   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 Says swelling improved previously with metolazone but had to stop taking due to severe constipation.   Has been diagnosed with OSA but refuses CPAP. HST 6/22 AHI 13.5 with sat down to 81%   At last visit we added Jardiance. She also saw Nephrology who started her on metolazone 2.5 on Tues and Saturdays. Initially ankles went way down but now back up a little. Weight was down 11 pounds but now back up 5 pounds. Drinking a lot of fluid. SOB with mild activity  Past Medical History:  Diagnosis Date   Allergic rhinitis    on allergy shots, sees Dr. Andee Poles   Anemia    Arthritis    B12 deficiency    Back pain    Bilateral edema of lower extremity    Cataract    CHF (congestive heart failure) (HCC)    Constipation    Edema    Gallbladder problem    GERD (gastroesophageal reflux disease)    History of stomach ulcers    Hyperlipidemia    Hypertension    Joint pain    Macular degeneration    follow[facility] by Dr Cherly Hensen at [facility]   Osteoarthritis    Other fatigue    Prediabetes    Proteinuria    RLS (restless legs syndrome)    Sciatica    Shortness of breath    Shortness of breath on exertion    Sleep apnea    Stage 3 chronic kidney disease (HCC)    Swallowing difficulty    Vitamin D deficiency     Current Outpatient Medications  Medication Sig Dispense Refill   azelastine (ASTELIN) 0.1 % nasal spray USE 2 SPRAYS IN EACH NOSTRIL DAILY 30 mL 11   bisacodyl (DULCOLAX) 5 MG EC tablet Take 5 mg by mouth daily as needed for moderate  constipation.     Cholecalciferol (VITAMIN D3) 5000 units CAPS Take 1 capsule by mouth daily.     clotrimazole-betamethasone (LOTRISONE) cream APPLY EXTERNALLY TO THE AFFECTED AREA TWICE DAILY AS NEEDED 60 g 0   empagliflozin (JARDIANCE) 10 MG TABS tablet Take 1 tablet (10 mg total) by mouth daily before breakfast. 30 tablet 6   esomeprazole (NEXIUM) 20 MG capsule Take 40 mg by mouth daily at 12 noon.      ezetimibe (ZETIA) 10 MG tablet TAKE 1 TABLET(10 MG) BY MOUTH DAILY 90 tablet 2   ipratropium (ATROVENT) 0.03 % nasal spray USE 2 SPRAYS IN EACH NOSTRIL EVERY 8 HOURS AS NEEDED 90 mL 1   levocetirizine (XYZAL) 5 MG tablet Take 5 mg by mouth every evening.     metolazone (ZAROXOLYN) 2.5 MG tablet Take 2.5 mg by mouth daily.     montelukast (SINGULAIR) 10 MG tablet TAKE 1 TABLET(10 MG) BY MOUTH DAILY 90 tablet 2   Multiple Vitamins-Minerals (EYE VITAMINS PO) Take 2 tablets by mouth daily. Ocuvite 50+     polyethylene glycol (MIRALAX / GLYCOLAX) 17 g packet Take 17 g by mouth daily as needed.     potassium chloride SA (KLOR-CON  M) 20 MEQ tablet Take 2 tablets (40 mEq total) by mouth 2 (two) times daily. 360 tablet 0   pramipexole (MIRAPEX) 0.75 MG tablet TAKE 1 TABLET(0.75 MG) BY MOUTH AT BEDTIME 90 tablet 1   pregabalin (LYRICA) 25 MG capsule Take 1 capsule (25 mg total) by mouth 2 (two) times daily. 60 capsule 1   spironolactone (ALDACTONE) 25 MG tablet Take 1 tablet (25 mg total) by mouth daily. 90 tablet 3   Tiotropium Bromide Monohydrate (SPIRIVA RESPIMAT) 2.5 MCG/ACT AERS INHALE 2 PUFFS INTO THE LUNGS DAILY 12 g 0   torsemide (DEMADEX) 100 MG tablet Take 1.5 tablets (150 mg total) by mouth daily. 135 tablet 3   triamcinolone (NASACORT) 55 MCG/ACT AERO nasal inhaler Place 2 sprays into the nose daily.     triamcinolone ointment (KENALOG) 0.5 % Apply 1 Application topically 2 (two) times daily as needed.     No current facility-administered medications for this visit.    Allergies   Allergen Reactions   Erythromycin Other (See Comments)    Other reaction(s): Headache Headache and GI upset Headache and GI upset   Losartan Nausea And Vomiting   Metoprolol Nausea And Vomiting   Simvastatin Other (See Comments)    Other reaction(s): Muscle Pain Pain lower extremities Pain lower extremities   Dexlansoprazole Diarrhea   Lisinopril Cough   Prednisone     Other reaction(s): Constipation GI upset      Social History   Socioeconomic History   Marital status: Divorced    Spouse name: Not on file   Number of children: 1   Years of education: Not on file   Highest education level: Bachelor's degree (e.g., BA, AB, BS)  Occupational History   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, theatre/television/film  Tobacco Use   Smoking status: Former    Packs/day: 1.00    Years: 10.00    Additional pack years: 0.00    Total pack years: 10.00    Types: Cigarettes    Start date: 11/29/1965    Quit date: 11/28/1976    Years since quitting: 46.4   Smokeless tobacco: Never  Vaping Use   Vaping Use: Never used  Substance and Sexual Activity   Alcohol use: Not Currently    Comment: rarely, at holidays   Drug use: No   Sexual activity: Not Currently  Other Topics Concern   Not on file  Social History Narrative   Not on file   Social Determinants of Health   Financial Resource Strain: Low Risk  (12/27/2022)   Overall Financial Resource Strain (CARDIA)    Difficulty of Paying Living Expenses: Not hard at all  Food Insecurity: No Food Insecurity (12/21/2022)   Hunger Vital Sign    Worried About Running Out of Food in the Last Year: Never true    Ran Out of Food in the Last Year: Never true  Transportation Needs: No Transportation Needs (12/27/2022)   PRAPARE - Administrator, Civil Service (Medical): No    Lack of Transportation (Non-Medical): No  Physical Activity: Inactive (02/24/2022)   Exercise Vital Sign    Days of Exercise per Week: 0 days     Minutes of Exercise per Session: 0 min  Stress: No Stress Concern Present (02/24/2022)   [employer] of Occupational Health - Occupational Stress Questionnaire    Feeling of Stress : Not at all  Social Connections: Unknown (02/24/2022)   Social Connection and Isolation Panel [NHANES]  Frequency of Communication with Friends and Family: More than three times a week    Frequency of Social Gatherings with Friends and Family: Once a week    Attends Religious Services: Never    Database administrator or Organizations: No    Attends Banker Meetings: Never    Marital Status: Not on file  Intimate Partner Violence: Not At Risk (02/24/2022)   Humiliation, Afraid, Rape, and Kick questionnaire    Fear of Current or Ex-Partner: No    Emotionally Abused: No    Physically Abused: No    Sexually Abused: No      Family History  Problem Relation Age of Onset   Hyperlipidemia Mother    Hypertension Mother    Seizures Mother    Hypertension Father    Heart disease Father 34       several heart attacks   Diabetes Father        diet controlled   Myelodysplastic syndrome Sister    Prostate cancer Paternal Grandfather    Breast cancer Neg Hx    Cervical cancer Neg Hx     Vitals:   05/02/23 1435  BP: (!) 109/57  Pulse: 92  SpO2: 97%  Weight: 239 lb (108.4 kg)   Wt Readings from Last 3 Encounters:  05/02/23 239 lb (108.4 kg)  04/19/23 233 lb 14.4 oz (106.1 kg)  04/01/23 244 lb (110.7 kg)    PHYSICAL EXAM: General:  Well appearing. No resp difficulty HEENT: normal Neck: supple. JVP 10 . Carotids 2+ bilat; no bruits. No lymphadenopathy or thryomegaly appreciated. Cor: PMI nondisplaced. Regular rate & rhythm. No rubs, gallops or murmurs. Lungs: clear Abdomen: obese  soft, nontender, nondistended. No hepatosplenomegaly. No bruits or masses. Good bowel sounds. Extremities: no cyanosis, clubbing, rash, 2+ edema Neuro: alert & orientedx3, cranial nerves grossly intact.  moves all 4 extremities w/o difficulty. Affect pleasant   ECG: Sinus rhythm 77 low volts Personally reviewed   ASSESSMENT & PLAN:  1. Chronic diastolic HF/lower extremity edema - suspect combination of diastolic HF and venous insufficiency - Echocardiogram 03/18/2021 EF 55-60% G1DD RV ok Personally reviewed - Volume status improved with addition of metolazone by Renal but now going back up in setting of high volume fluid intake - Continue torsemide 100/50 and spiro 25 daily - Continue metolazone 2.5 Tues/Sat - Continue Jardiance 10  - Will give Furoscix to use PRN for volume overload - Unable to use compression stockings -> failed using ACE wraps - Long discussion about need for dietary restriction  2. OSA - Mild to moderate AHI 13.5 - refuses CPAP  3. Morbid obesity - consider GLP1RA  4. CKD  3b - follows with Dr. Thedore Mins at [facility] - Continue Jardiance  - check labs today   Arvilla Meres, MD  3:12 PM

## 2023-05-04 ENCOUNTER — Other Ambulatory Visit: Payer: Self-pay | Admitting: *Deleted

## 2023-05-06 ENCOUNTER — Ambulatory Visit: Payer: Medicare Other | Admitting: Cardiology

## 2023-05-16 DIAGNOSIS — R600 Localized edema: Secondary | ICD-10-CM | POA: Diagnosis not present

## 2023-05-16 DIAGNOSIS — I129 Hypertensive chronic kidney disease with stage 1 through stage 4 chronic kidney disease, or unspecified chronic kidney disease: Secondary | ICD-10-CM | POA: Diagnosis not present

## 2023-05-16 DIAGNOSIS — N1832 Chronic kidney disease, stage 3b: Secondary | ICD-10-CM | POA: Diagnosis not present

## 2023-05-24 ENCOUNTER — Encounter: Payer: Self-pay | Admitting: Family Medicine

## 2023-05-24 ENCOUNTER — Ambulatory Visit (INDEPENDENT_AMBULATORY_CARE_PROVIDER_SITE_OTHER): Payer: Medicare Other | Admitting: Family Medicine

## 2023-05-24 VITALS — BP 106/39 | HR 79 | Temp 98.0°F | Resp 16 | Wt 237.2 lb

## 2023-05-24 DIAGNOSIS — N1832 Chronic kidney disease, stage 3b: Secondary | ICD-10-CM

## 2023-05-24 DIAGNOSIS — E1159 Type 2 diabetes mellitus with other circulatory complications: Secondary | ICD-10-CM

## 2023-05-24 DIAGNOSIS — E1122 Type 2 diabetes mellitus with diabetic chronic kidney disease: Secondary | ICD-10-CM | POA: Diagnosis not present

## 2023-05-24 DIAGNOSIS — E1169 Type 2 diabetes mellitus with other specified complication: Secondary | ICD-10-CM | POA: Diagnosis not present

## 2023-05-24 DIAGNOSIS — E785 Hyperlipidemia, unspecified: Secondary | ICD-10-CM | POA: Diagnosis not present

## 2023-05-24 DIAGNOSIS — M791 Myalgia, unspecified site: Secondary | ICD-10-CM

## 2023-05-24 DIAGNOSIS — T466X5A Adverse effect of antihyperlipidemic and antiarteriosclerotic drugs, initial encounter: Secondary | ICD-10-CM | POA: Insufficient documentation

## 2023-05-24 DIAGNOSIS — I152 Hypertension secondary to endocrine disorders: Secondary | ICD-10-CM | POA: Diagnosis not present

## 2023-05-24 DIAGNOSIS — M48061 Spinal stenosis, lumbar region without neurogenic claudication: Secondary | ICD-10-CM | POA: Diagnosis not present

## 2023-05-24 DIAGNOSIS — G894 Chronic pain syndrome: Secondary | ICD-10-CM

## 2023-05-24 NOTE — Assessment & Plan Note (Signed)
Unsuccessful trial of Lyrica 25mg  due to intolerable side effects. Previous trials of gabapentin and Cymbalta were also unsuccessful. Patient has seen orthopedics and pain management, but is not interested in further interventions at this time. -Continue current management and consider referral to pain management when patient is ready.

## 2023-05-24 NOTE — Assessment & Plan Note (Signed)
LDL previously at 117, goal is below 70 due to diabetes. Patient has difficulty tolerating statins. -Continue Zetia. -Monitor cholesterol levels regularly.

## 2023-05-24 NOTE — Assessment & Plan Note (Signed)
Intolerant to statins. Continue zetia. 

## 2023-05-24 NOTE — Patient Instructions (Signed)

## 2023-05-24 NOTE — Progress Notes (Signed)
I,Sulibeya S Dimas,acting as a Neurosurgeon for Gloria Latch, MD.,have documented all relevant documentation on the behalf of Gloria Latch, MD,as directed by  Gloria Latch, MD while in the presence of Gloria Latch, MD.     Established patient visit   Patient: Gloria Mercer   DOB: [birth year]   82 y.o. Female  MRN: [id number] Visit Date: 05/24/2023  Today's healthcare provider: [name], MD   Chief Complaint  Patient presents with   Follow-up   Subjective    HPI  Follow up for chronic pain  The patient was last seen for this 4 weeks ago. Changes made at last visit include start Lyrica 25 mg daily.  She reports poor compliance with treatment. Patient stopped Lyrica after a few doses. Maybe took for one week. She did try taking two tablets and that caused her weird feeling. She tried going back to one tablet. However that did not make any change with pain.  She feels that condition is Unchanged. She is having side effects. Felt "weird"   ---------------------------------------------------------------------------------------  Discussed the use of AI scribe software for clinical note transcription with the patient, who gave verbal consent to proceed.  History of Present Illness   The patient, with a history of spinal stenosis, presents for a follow-up visit. They have tried multiple medications including Lyrica, gabapentin, and Cymbalta, but all have resulted in intolerable side effects. The patient reports feeling "loopy" on Lyrica, even at a low dose of 25 mg. They have also seen a pain management specialist in the past, but declined further interventions at that time.  In addition to the spinal stenosis, the patient has recently been diagnosed with diabetes. Their A1c has increased to 6.5, indicating poor blood sugar control. The patient admits to struggling with diet control, particularly with carbohydrate intake. They have been trying to make healthier  choices, such as using sara lee Delightful Red bread and limiting their intake of watermelon.  The patient also has a history of heart disease and is currently on Zetia for cholesterol management. They have previously tried statins, but could not tolerate them due to adverse effects on their legs and feet.       Medications: Outpatient Medications Prior to Visit  Medication Sig   azelastine (ASTELIN) 0.1 % nasal spray USE 2 SPRAYS IN EACH NOSTRIL DAILY   bisacodyl (DULCOLAX) 5 MG EC tablet Take 5 mg by mouth daily as needed for moderate constipation.   Cholecalciferol (VITAMIN D3) 5000 units CAPS Take 1 capsule by mouth daily.   clotrimazole-betamethasone (LOTRISONE) cream APPLY EXTERNALLY TO THE AFFECTED AREA TWICE DAILY AS NEEDED   empagliflozin (JARDIANCE) 10 MG TABS tablet Take 1 tablet (10 mg total) by mouth daily before breakfast.   esomeprazole (NEXIUM) 20 MG capsule Take 40 mg by mouth daily at 12 noon.    ezetimibe (ZETIA) 10 MG tablet TAKE 1 TABLET(10 MG) BY MOUTH DAILY   ipratropium (ATROVENT) 0.03 % nasal spray USE 2 SPRAYS IN EACH NOSTRIL EVERY 8 HOURS AS NEEDED   levocetirizine (XYZAL) 5 MG tablet Take 5 mg by mouth every evening.   metolazone (ZAROXOLYN) 2.5 MG tablet Take 2.5 mg by mouth. Tuesday and Saturday   montelukast (SINGULAIR) 10 MG tablet TAKE 1 TABLET(10 MG) BY MOUTH DAILY   Multiple Vitamins-Minerals (EYE VITAMINS PO) Take 2 tablets by mouth daily. Ocuvite 50+   polyethylene glycol (MIRALAX / GLYCOLAX) 17 g packet Take 17 g by mouth daily as needed.   potassium chloride SA (KLOR-CON  M) 20 MEQ tablet Take 2 tablets (40 mEq total) by mouth 2 (two) times daily.   pramipexole (MIRAPEX) 0.75 MG tablet TAKE 1 TABLET(0.75 MG) BY MOUTH AT BEDTIME   spironolactone (ALDACTONE) 25 MG tablet Take 1 tablet (25 mg total) by mouth daily.   torsemide (DEMADEX) 100 MG tablet Take 1.5 tablets (150 mg total) by mouth daily.   triamcinolone (NASACORT) 55 MCG/ACT AERO nasal inhaler  Place 2 sprays into the nose daily.   triamcinolone ointment (KENALOG) 0.5 % Apply 1 Application topically 2 (two) times daily as needed.   pregabalin (LYRICA) 25 MG capsule Take 1 capsule (25 mg total) by mouth 2 (two) times daily. (Patient not taking: Reported on 05/24/2023)   Tiotropium Bromide Monohydrate (SPIRIVA RESPIMAT) 2.5 MCG/ACT AERS INHALE 2 PUFFS INTO THE LUNGS DAILY (Patient not taking: Reported on 05/24/2023)   No facility-administered medications prior to visit.    Review of Systems per HPI     Objective    BP (!) 106/39 (BP Location: Left Arm, Patient Position: Sitting, Cuff Size: Large)   Pulse 79   Temp 98 F (36.7 C) (Temporal)   Resp 16   Wt 237 lb 3.2 oz (107.6 kg)   SpO2 96%   BMI 44.82 kg/m  BP Readings from Last 3 Encounters:  05/24/23 (!) 106/39  05/02/23 (!) 109/57  04/19/23 122/65   Wt Readings from Last 3 Encounters:  05/24/23 237 lb 3.2 oz (107.6 kg)  05/02/23 239 lb (108.4 kg)  04/19/23 233 lb 14.4 oz (106.1 kg)      Physical Exam Vitals reviewed.  Constitutional:      General: She is not in acute distress.    Appearance: Normal appearance. She is well-developed. She is not diaphoretic.  HENT:     Head: Normocephalic and atraumatic.  Eyes:     General: No scleral icterus.    Conjunctiva/sclera: Conjunctivae normal.  Neck:     Thyroid: No thyromegaly.  Cardiovascular:     Rate and Rhythm: Normal rate and regular rhythm.     Heart sounds: Normal heart sounds. No murmur heard. Pulmonary:     Effort: Pulmonary effort is normal. No respiratory distress.     Breath sounds: Normal breath sounds. No wheezing, rhonchi or rales.  Musculoskeletal:     Cervical back: Neck supple.     Right lower leg: No edema.     Left lower leg: No edema.  Lymphadenopathy:     Cervical: No cervical adenopathy.  Skin:    General: Skin is warm and dry.     Findings: No rash.  Neurological:     Mental Status: She is alert and oriented to person, place, and  time. Mental status is at baseline.  Psychiatric:        Mood and Affect: Mood normal.        Behavior: Behavior normal.       No results found for any visits on 05/24/23.  Assessment & Plan     Problem List Items Addressed This Visit       Cardiovascular and Mediastinum   Hypertension associated with diabetes (HCC)    Well controlled Continue current medications      Relevant Orders   Comprehensive metabolic panel     Endocrine   Hyperlipidemia associated with type 2 diabetes mellitus (HCC)    LDL previously at 117, goal is below 70 due to diabetes. Patient has difficulty tolerating statins. -Continue Zetia. -Monitor cholesterol levels regularly.  Relevant Orders   Lipid panel   Comprehensive metabolic panel   Z6XW (type 2 diabetes mellitus) (HCC)    New diagnosis. Recent increase in HbA1c to 6.5, indicating progression from prediabetes to diabetes. No recent steroid use. Patient has difficulty with dietary control. -Refer to nutritionist for meal planning assistance. -Continue current lifestyle management and recheck HbA1c in a few months. -Consider increasing dose of Jardiance if HbA1c remains elevated. - foot exam and UACR today - UTD on eye exam      Relevant Orders   Urine Microalbumin w/creat. ratio   Amb ref to Medical Nutrition Therapy-MNT   Hemoglobin A1c     Genitourinary   Stage 3b chronic kidney disease (HCC)    Continue to f/u with Nephrology UACR today      Relevant Orders   Comprehensive metabolic panel     Other   Spinal stenosis of lumbar region without neurogenic claudication    Unsuccessful trial of Lyrica 25mg  due to intolerable side effects. Previous trials of gabapentin and Cymbalta were also unsuccessful. Patient has seen orthopedics and pain management, but is not interested in further interventions at this time. -Continue current management and consider referral to pain management when patient is ready.      Chronic pain  syndrome - Primary    Unsuccessful trial of Lyrica 25mg  due to intolerable side effects. Previous trials of gabapentin and Cymbalta were also unsuccessful. Patient has seen orthopedics and pain management, but is not interested in further interventions at this time. -Continue current management and consider referral to pain management when patient is ready.      Myalgia due to statin    Intolerant to statins Continue zetia           General Health Maintenance: -Annual foot exam for diabetes. -Annual urine microalbumin test for kidney protection. -Annual eye exam for diabetic retinopathy.        Return in about 3 months (around 08/24/2023) for chronic disease f/u.      I, Gloria Latch, MD, have reviewed all documentation for this visit. The documentation on 05/24/23 for the exam, diagnosis, procedures, and orders are all accurate and complete.   Jaquetta Currier, Marzella Schlein, MD, MPH [facility]

## 2023-05-24 NOTE — Assessment & Plan Note (Signed)
New diagnosis. Recent increase in HbA1c to 6.5, indicating progression from prediabetes to diabetes. No recent steroid use. Patient has difficulty with dietary control. -Refer to nutritionist for meal planning assistance. -Continue current lifestyle management and recheck HbA1c in a few months. -Consider increasing dose of Jardiance if HbA1c remains elevated. - foot exam and UACR today - UTD on eye exam

## 2023-05-24 NOTE — Assessment & Plan Note (Signed)
Well controlled. Continue current medications  

## 2023-05-24 NOTE — Assessment & Plan Note (Signed)
Continue to f/u with Nephrology UACR today

## 2023-05-24 NOTE — Assessment & Plan Note (Signed)
Unsuccessful trial of Lyrica 25mg due to intolerable side effects. Previous trials of gabapentin and Cymbalta were also unsuccessful. Patient has seen orthopedics and pain management, but is not interested in further interventions at this time. -Continue current management and consider referral to pain management when patient is ready. 

## 2023-05-25 LAB — MICROALBUMIN / CREATININE URINE RATIO
Creatinine, Urine: 39.3 mg/dL
Microalb/Creat Ratio: <8 mg/g creat (ref 0–29)
Microalbumin, Urine: <3 ug/mL

## 2023-05-31 ENCOUNTER — Other Ambulatory Visit (HOSPITAL_COMMUNITY): Payer: Self-pay

## 2023-05-31 ENCOUNTER — Telehealth: Payer: Self-pay

## 2023-05-31 NOTE — Telephone Encounter (Signed)
I am unable to verify copay costs at this time, as the patient has Part A and Part B on file, but no active drug coverage has been scanned in or added to patient chart. I also do not see a current rx for Furoscix on the patient chart, though there is a history of furosemide tablets.  If Furoscix is being started, the office may need to send new rx in to the Furo[employer] Direct program for processing and patient assistance options. If changed to tablet dosing I would be happy to review cost concerns or barriers for this patient.

## 2023-05-31 NOTE — Telephone Encounter (Signed)
Thank you for assisting. An order was placed for the pt on [employer]uro[employer] Direct on 6/4. PA was approved and then sent to [facility] pharmacy. Patient states she does not have any other insurance/drug coverage to add.  We have reached out to [employer] Direct for assistance as well, awaiting response.

## 2023-05-31 NOTE — Telephone Encounter (Signed)
Patient called to report that she will not be able to get her furoscix medication because she cannot afford it. She states that someone from [employer]uroscix, or the pharmacy they use, called her stating that she would have to pay $899.00 for the furoscix prescription.   Informed patient I will send message on to pharmacy and see if there's anything we can do to assist in bringing the price down. Pt agreeable, and verbalized understanding.

## 2023-06-01 ENCOUNTER — Telehealth: Payer: Self-pay | Admitting: Cardiology

## 2023-06-01 MED ORDER — POTASSIUM CHLORIDE CRYS ER 20 MEQ PO TBCR: 40.0000 meq | EXTENDED_RELEASE_TABLET | Freq: Two times a day (BID) | ORAL | 0 refills | Status: DC

## 2023-06-01 NOTE — Telephone Encounter (Signed)
*  STAT* If patient is at the pharmacy, call can be transferred to refill team.   1. Which medications need to be refilled? (please list name of each medication and dose if known)   potassium chloride SA (KLOR-CON M) 20 MEQ tablet   2. Which pharmacy/location (including street and city if local pharmacy) is medication to be sent to?  [id number] - [place], [place] - [address] AT [address] OF [address]   3. Do they need a 30 day or 90 day supply?   90 day  Patient stated she is completely out of this medication.

## 2023-06-01 NOTE — Telephone Encounter (Signed)
Requested Prescriptions   Signed Prescriptions Disp Refills   potassium chloride SA (KLOR-CON M) 20 MEQ tablet 360 tablet 0    Sig: Take 2 tablets (40 mEq total) by mouth 2 (two) times daily.    Authorizing Provider: Debbe Odea    Ordering User: Guerry Minors

## 2023-06-03 NOTE — Telephone Encounter (Signed)
Called patient to inform her, that per Furoscix direct, there is unfortunately no financial assistance for medicare patients. They suggested patient could receive a lower quantity of medication for half the cost. For example 2 doses for $449.92. Patient acknowledged and states she has not been able to find any assistance either. She states she will not be able to afford this medication. Reminded patient of upcoming appointment, where alternatives can be discussed with provider. Patient was aware and agreeable.

## 2023-06-08 ENCOUNTER — Ambulatory Visit: Payer: Medicare Other

## 2023-06-08 DIAGNOSIS — M180 Bilateral primary osteoarthritis of first carpometacarpal joints: Secondary | ICD-10-CM | POA: Diagnosis not present

## 2023-06-08 DIAGNOSIS — R252 Cramp and spasm: Secondary | ICD-10-CM | POA: Diagnosis not present

## 2023-06-08 DIAGNOSIS — M65332 Trigger finger, left middle finger: Secondary | ICD-10-CM | POA: Diagnosis not present

## 2023-06-16 ENCOUNTER — Encounter: Payer: Self-pay | Admitting: Internal Medicine

## 2023-06-16 ENCOUNTER — Other Ambulatory Visit
Admission: RE | Admit: 2023-06-16 | Discharge: 2023-06-16 | Disposition: A | Discharge status: Home / Self Care | Payer: Medicare Other | Source: Ambulatory Visit | Attending: Internal Medicine | Admitting: Internal Medicine

## 2023-06-16 ENCOUNTER — Ambulatory Visit (HOSPITAL_BASED_OUTPATIENT_CLINIC_OR_DEPARTMENT_OTHER): Payer: Medicare Other | Admitting: Internal Medicine

## 2023-06-16 VITALS — BP 133/64 | HR 84 | Wt 241.4 lb

## 2023-06-16 DIAGNOSIS — I509 Heart failure, unspecified: Secondary | ICD-10-CM | POA: Insufficient documentation

## 2023-06-16 DIAGNOSIS — N183 Chronic kidney disease, stage 3 unspecified: Secondary | ICD-10-CM

## 2023-06-16 DIAGNOSIS — I5032 Chronic diastolic (congestive) heart failure: Secondary | ICD-10-CM

## 2023-06-16 LAB — BASIC METABOLIC PANEL
Anion gap: 10 (ref 5–15)
BUN: 35 mg/dL — ABNORMAL HIGH (ref 8–23)
CO2: 25 mmol/L (ref 22–32)
Calcium: 8.9 mg/dL (ref 8.9–10.3)
Chloride: 97 mmol/L — ABNORMAL LOW (ref 98–111)
Creatinine, Ser: 1.35 mg/dL — ABNORMAL HIGH (ref 0.44–1.00)
GFR, Estimated: 39 mL/min — ABNORMAL LOW (ref >60)
Glucose, Bld: 123 mg/dL — ABNORMAL HIGH (ref 70–99)
Potassium: 3.5 mmol/L (ref 3.5–5.1)
Sodium: 132 mmol/L — ABNORMAL LOW (ref 135–145)

## 2023-06-16 LAB — BRAIN NATRIURETIC PEPTIDE: B Natriuretic Peptide: 31.9 pg/mL (ref 0.0–100.0)

## 2023-06-16 NOTE — Progress Notes (Signed)
ADVANCED HF CLINIC CONSULT NOTE  Referring Physician: Erasmo Downer, MD Primary Care: Erasmo Downer, MD Primary Cardiologist: Debbe Odea, MD  HPI:  Gloria Mercer is a 82 y.o. female with a hx of HFpEF, hypertension, obesity, CKD 3b.   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 HST 6/22 AHI 13.5 with sat down to 81%   Remains on metolazone 2.5 on Tues and Saturdays. Had been on torsemide 100/50 but Nephrology stopped the evening dose as SCr bumped from 1.41-> 1.67. Still drinking a lot of fluid. Feeling very SOB. + edema/bloated. Weight up 4 pounds. Not using CPAP    Past Medical History:  Diagnosis Date   Allergic rhinitis    on allergy shots, sees Dr. Andee Poles   Anemia    Arthritis    B12 deficiency    Back pain    Bilateral edema of lower extremity    Cataract    CHF (congestive heart failure) (HCC)    Constipation    Edema    Gallbladder problem    GERD (gastroesophageal reflux disease)    History of stomach ulcers    Hyperlipidemia    Hypertension    Joint pain    Macular degeneration    followed by Dr Cherly Hensen at [facility]   Osteoarthritis    Other fatigue    Prediabetes    Proteinuria    RLS (restless legs syndrome)    Sciatica    Shortness of breath    Shortness of breath on exertion    Sleep apnea    Stage 3 chronic kidney disease (HCC)    Swallowing difficulty    Vitamin D deficiency     Current Outpatient Medications  Medication Sig Dispense Refill   azelastine (ASTELIN) 0.1 % nasal spray USE 2 SPRAYS IN EACH NOSTRIL DAILY 30 mL 11   bisacodyl (DULCOLAX) 5 MG EC tablet Take 5 mg by mouth daily as needed for moderate constipation.     Cholecalciferol (VITAMIN D3) 5000 units CAPS Take 1 capsule by mouth daily.     clotrimazole-betamethasone (LOTRISONE) cream APPLY EXTERNALLY TO THE AFFECTED AREA TWICE  DAILY AS NEEDED 60 g 0   empagliflozin (JARDIANCE) 10 MG TABS tablet Take 1 tablet (10 mg total) by mouth daily before breakfast. 30 tablet 6   esomeprazole (NEXIUM) 20 MG capsule Take 40 mg by mouth daily at 12 noon.      ezetimibe (ZETIA) 10 MG tablet TAKE 1 TABLET(10 MG) BY MOUTH DAILY 90 tablet 2   Furosemide (FUROSCIX) 80 MG/10ML CTKT Inject 1 Dose into the skin daily as needed (for swelling). Call the Heart Failure Clinic for further instruction before taking this medicine     ipratropium (ATROVENT) 0.03 % nasal spray USE 2 SPRAYS IN EACH NOSTRIL EVERY 8 HOURS AS NEEDED 90 mL 1   levocetirizine (XYZAL) 5 MG tablet Take 5 mg by mouth every evening.     metolazone (ZAROXOLYN) 2.5 MG tablet Take 2.5 mg by mouth. Tuesday and Saturday     montelukast (SINGULAIR) 10 MG tablet TAKE 1 TABLET(10 MG) BY MOUTH DAILY 90 tablet 2   Multiple Vitamins-Minerals (EYE VITAMINS PO) Take 2 tablets by mouth daily. Ocuvite 50+     polyethylene glycol (MIRALAX / GLYCOLAX) 17 g packet Take 17 g by mouth daily as needed.     potassium chloride SA (KLOR-CON  M) 20 MEQ tablet Take 2 tablets (40 mEq total) by mouth 2 (two) times daily. 360 tablet 0   pramipexole (MIRAPEX) 0.75 MG tablet TAKE 1 TABLET(0.75 MG) BY MOUTH AT BEDTIME 90 tablet 1   pregabalin (LYRICA) 25 MG capsule Take 1 capsule (25 mg total) by mouth 2 (two) times daily. 60 capsule 1   spironolactone (ALDACTONE) 25 MG tablet Take 1 tablet (25 mg total) by mouth daily. 90 tablet 3   torsemide (DEMADEX) 100 MG tablet Take 1.5 tablets (150 mg total) by mouth daily. (Patient taking differently: Take 100 mg by mouth daily.) 135 tablet 3   triamcinolone (NASACORT) 55 MCG/ACT AERO nasal inhaler Place 2 sprays into the nose daily.     triamcinolone ointment (KENALOG) 0.5 % Apply 1 Application topically 2 (two) times daily as needed.     Tiotropium Bromide Monohydrate (SPIRIVA RESPIMAT) 2.5 MCG/ACT AERS INHALE 2 PUFFS INTO THE LUNGS DAILY (Patient not taking:  Reported on 05/24/2023) 12 g 0   No current facility-administered medications for this visit.    Allergies  Allergen Reactions   Erythromycin Other (See Comments)    Other reaction(s): Headache Headache and GI upset Headache and GI upset   Losartan Nausea And Vomiting   Metoprolol Nausea And Vomiting   Simvastatin Other (See Comments)    Other reaction(s): Muscle Pain Pain lower extremities Pain lower extremities   Dexlansoprazole Diarrhea   Lisinopril Cough   Prednisone     Other reaction(s): Constipation GI upset      Social History   Socioeconomic History   Marital status: Divorced    Spouse name: Not on file   Number of children: 1   Years of education: Not on file   Highest education level: Bachelor's degree (e.g., BA, AB, BS)  Occupational History   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, theatre/television/film  Tobacco Use   Smoking status: Former    Current packs/day: 0.00    Average packs/day: 1 pack/day for 11.0 years (11.0 ttl pk-yrs)    Types: Cigarettes    Start date: 11/29/1965    Quit date: 11/28/1976    Years since quitting: 46.5   Smokeless tobacco: Never  Vaping Use   Vaping status: Never Used  Substance and Sexual Activity   Alcohol use: Not Currently    Comment: rarely, at holidays   Drug use: No   Sexual activity: Not Currently  Other Topics Concern   Not on file  Social History Narrative   Not on file   Social Determinants of Health   Financial Resource Strain: Low Risk  (12/27/2022)   Overall Financial Resource Strain (CARDIA)    Difficulty of Paying Living Expenses: Not hard at all  Food Insecurity: No Food Insecurity (12/21/2022)   Hunger Vital Sign    Worried About Running Out of Food in the Last Year: Never true    Ran Out of Food in the Last Year: Never true  Transportation Needs: No Transportation Needs (12/27/2022)   PRAPARE - Administrator, Civil Service (Medical): No    Lack of Transportation (Non-Medical):  No  Physical Activity: Inactive (02/24/2022)   Exercise Vital Sign    Days of Exercise per Week: 0 days    Minutes of Exercise per Session: 0 min  Stress: No Stress Concern Present (02/24/2022)   [employer] of Occupational Health - Occupational Stress Questionnaire    Feeling of Stress : Not at all  Social Connections: Unknown (02/24/2022)  Social Advertising account executive [NHANES]    Frequency of Communication with Friends and Family: More than three times a week    Frequency of Social Gatherings with Friends and Family: Once a week    Attends Religious Services: Never    Database administrator or Organizations: No    Attends Banker Meetings: Never    Marital Status: Not on file  Intimate Partner Violence: Not At Risk (02/24/2022)   Humiliation, Afraid, Rape, and Kick questionnaire    Fear of Current or Ex-Partner: No    Emotionally Abused: No    Physically Abused: No    Sexually Abused: No      Family History  Problem Relation Age of Onset   Hyperlipidemia Mother    Hypertension Mother    Seizures Mother    Hypertension Father    Heart disease Father 25       several heart attacks   Diabetes Father        diet controlled   Myelodysplastic syndrome Sister    Prostate cancer Paternal Grandfather    Breast cancer Neg Hx    Cervical cancer Neg Hx     Vitals:   06/16/23 1037  BP: 133/64  Pulse: 84  SpO2: 97%  Weight: 241 lb 6.4 oz (109.5 kg)    Wt Readings from Last 3 Encounters:  06/16/23 241 lb 6.4 oz (109.5 kg)  05/24/23 237 lb 3.2 oz (107.6 kg)  05/02/23 239 lb (108.4 kg)    PHYSICAL EXAM: General:  Obese woman sitting in chair  No resp difficulty HEENT: normal Neck: supple. Hard to see JVP. Carotids 2+ bilat; no bruits. No lymphadenopathy or thryomegaly appreciated. Cor: PMI nondisplaced. Regular rate & rhythm. No rubs, gallops or murmurs. Lungs: clear Abdomen: obese soft, nontender, nondistended. No hepatosplenomegaly. No bruits  or masses. Good bowel sounds. Extremities: no cyanosis, clubbing, rash, 2+ edema Neuro: alert & orientedx3, cranial nerves grossly intact. moves all 4 extremities w/o difficulty. Affect pleasant  ASSESSMENT & PLAN:  1. Chronic diastolic HF/lower extremity edema - suspect combination of diastolic HF and venous insufficiency - Echocardiogram 03/18/2021 EF 55-60% G1DD RV ok Personally reviewed - Volume status back up after diuretics recently cut back - Increase torsemide 100 daily back to 100/50  - Continue spiro 25 daily - Continue metolazone 2.5 Tues/Sat - Continue Jardiance 10  - Have asked her to use one dose Furoscix today (and as needed for breakthrough volume overload) - Unable to use compression stockings -> failed using ACE wraps - Again discussed need for dietary restriction - Has repeat echo next week   2. OSA - Mild to moderate AHI 13.5 - refuses CPAP  3. Morbid obesity - her weight is really impacting QoL - I have asked her to discuss GLP1RA with her PCP  4. CKD  3b - follows with Dr. Thedore Mins at [facility] - Continue Jardiance  - Management of diuretics as above - check labs today  5. DM2 - needs GLP1RA   Arvilla Meres, MD  10:47 AM

## 2023-06-16 NOTE — Patient Instructions (Addendum)
Medication Changes:  Today: Take one 80 mg furoscix injection.  Tomorrow: Resume taking torsemide 100 mg in the AM and 50 mg in the PM  Lab Work:  Labs done today, your results will be available in MyChart, we will contact you for abnormal readings.   Special Instructions // Education:  Do the following things EVERYDAY: Weigh yourself in the morning before breakfast. Write it down and keep it in a log. Take your medicines as prescribed Eat low salt foods--Limit salt (sodium) to 2000 mg per day.  Stay as active as you can everyday Limit all fluids for the day to less than 2 liters     Follow Up: 2 months  If you have any questions or concerns before your next appointment please send [place] a message through mychart or call our office at [phone] Monday-Friday 8 am-5 pm.   If you have an urgent need after hours on the weekend please call your Primary Cardiologist or the [facility] Failure [facility] in [place] at [phone].

## 2023-06-17 ENCOUNTER — Ambulatory Visit: Payer: Medicare Other | Admitting: Cardiology

## 2023-06-21 ENCOUNTER — Ambulatory Visit
Admission: RE | Admit: 2023-06-21 | Discharge: 2023-06-21 | Disposition: A | Discharge status: Home / Self Care | Payer: Medicare Other | Source: Ambulatory Visit | Attending: Internal Medicine | Admitting: Internal Medicine

## 2023-06-21 DIAGNOSIS — I509 Heart failure, unspecified: Secondary | ICD-10-CM | POA: Insufficient documentation

## 2023-06-21 DIAGNOSIS — R0602 Shortness of breath: Secondary | ICD-10-CM | POA: Insufficient documentation

## 2023-06-21 DIAGNOSIS — G4730 Sleep apnea, unspecified: Secondary | ICD-10-CM | POA: Insufficient documentation

## 2023-06-21 DIAGNOSIS — E669 Obesity, unspecified: Secondary | ICD-10-CM | POA: Insufficient documentation

## 2023-06-21 DIAGNOSIS — I130 Hypertensive heart and chronic kidney disease with heart failure and stage 1 through stage 4 chronic kidney disease, or unspecified chronic kidney disease: Secondary | ICD-10-CM | POA: Insufficient documentation

## 2023-06-21 DIAGNOSIS — R609 Edema, unspecified: Secondary | ICD-10-CM | POA: Diagnosis not present

## 2023-06-21 DIAGNOSIS — R5383 Other fatigue: Secondary | ICD-10-CM | POA: Insufficient documentation

## 2023-06-21 DIAGNOSIS — I5043 Acute on chronic combined systolic (congestive) and diastolic (congestive) heart failure: Secondary | ICD-10-CM | POA: Diagnosis not present

## 2023-06-21 DIAGNOSIS — N189 Chronic kidney disease, unspecified: Secondary | ICD-10-CM | POA: Diagnosis not present

## 2023-06-21 DIAGNOSIS — E1122 Type 2 diabetes mellitus with diabetic chronic kidney disease: Secondary | ICD-10-CM | POA: Diagnosis not present

## 2023-06-21 DIAGNOSIS — Z87891 Personal history of nicotine dependence: Secondary | ICD-10-CM | POA: Diagnosis not present

## 2023-06-21 LAB — ECHOCARDIOGRAM COMPLETE
AR max vel: 2.88 cm2
AV Area VTI: 2.85 cm2
AV Area mean vel: 2.85 cm2
AV Mean grad: 3 mmHg
AV Peak grad: 5.6 mmHg
Ao pk vel: 1.18 m/s
Area-P 1/2: 3.21 cm2
MV VTI: 2.25 cm2
S' Lateral: 2.5 cm

## 2023-06-21 NOTE — Progress Notes (Signed)
*  PRELIMINARY RESULTS* Echocardiogram 2D Echocardiogram has been performed.  Carolyne Fiscal 06/21/2023, 11:54 AM

## 2023-06-30 ENCOUNTER — Encounter: Payer: Medicare Other | Admitting: Family Medicine

## 2023-07-01 DIAGNOSIS — N1832 Chronic kidney disease, stage 3b: Secondary | ICD-10-CM | POA: Diagnosis not present

## 2023-07-01 DIAGNOSIS — E1159 Type 2 diabetes mellitus with other circulatory complications: Secondary | ICD-10-CM | POA: Diagnosis not present

## 2023-07-01 DIAGNOSIS — E1169 Type 2 diabetes mellitus with other specified complication: Secondary | ICD-10-CM | POA: Diagnosis not present

## 2023-07-01 DIAGNOSIS — E785 Hyperlipidemia, unspecified: Secondary | ICD-10-CM | POA: Diagnosis not present

## 2023-07-01 DIAGNOSIS — I152 Hypertension secondary to endocrine disorders: Secondary | ICD-10-CM | POA: Diagnosis not present

## 2023-07-01 DIAGNOSIS — E1122 Type 2 diabetes mellitus with diabetic chronic kidney disease: Secondary | ICD-10-CM | POA: Diagnosis not present

## 2023-07-05 ENCOUNTER — Encounter: Payer: Medicare Other | Admitting: Family Medicine

## 2023-07-11 ENCOUNTER — Encounter: Payer: Self-pay | Admitting: Family Medicine

## 2023-07-11 ENCOUNTER — Telehealth: Payer: Self-pay | Admitting: Family Medicine

## 2023-07-11 ENCOUNTER — Ambulatory Visit (INDEPENDENT_AMBULATORY_CARE_PROVIDER_SITE_OTHER): Payer: Medicare Other | Admitting: Family Medicine

## 2023-07-11 VITALS — BP 125/66 | HR 88 | Temp 98.1°F | Resp 20 | Ht 61.0 in | Wt 235.3 lb

## 2023-07-11 DIAGNOSIS — Z6841 Body Mass Index (BMI) 40.0 and over, adult: Secondary | ICD-10-CM | POA: Diagnosis not present

## 2023-07-11 DIAGNOSIS — E1169 Type 2 diabetes mellitus with other specified complication: Secondary | ICD-10-CM | POA: Diagnosis not present

## 2023-07-11 DIAGNOSIS — H353210 Exudative age-related macular degeneration, right eye, stage unspecified: Secondary | ICD-10-CM | POA: Diagnosis not present

## 2023-07-11 DIAGNOSIS — N1832 Chronic kidney disease, stage 3b: Secondary | ICD-10-CM | POA: Diagnosis not present

## 2023-07-11 DIAGNOSIS — E785 Hyperlipidemia, unspecified: Secondary | ICD-10-CM

## 2023-07-11 DIAGNOSIS — G894 Chronic pain syndrome: Secondary | ICD-10-CM | POA: Diagnosis not present

## 2023-07-11 DIAGNOSIS — I152 Hypertension secondary to endocrine disorders: Secondary | ICD-10-CM | POA: Diagnosis not present

## 2023-07-11 DIAGNOSIS — Z7985 Long-term (current) use of injectable non-insulin antidiabetic drugs: Secondary | ICD-10-CM | POA: Diagnosis not present

## 2023-07-11 DIAGNOSIS — J411 Mucopurulent chronic bronchitis: Secondary | ICD-10-CM

## 2023-07-11 DIAGNOSIS — E1159 Type 2 diabetes mellitus with other circulatory complications: Secondary | ICD-10-CM | POA: Diagnosis not present

## 2023-07-11 DIAGNOSIS — Z0000 Encounter for general adult medical examination without abnormal findings: Secondary | ICD-10-CM

## 2023-07-11 MED ORDER — OZEMPIC (0.25 OR 0.5 MG/DOSE) 2 MG/3ML ~~LOC~~ SOPN: PEN_INJECTOR | SUBCUTANEOUS | 2 refills | Status: AC

## 2023-07-11 MED ORDER — TRELEGY ELLIPTA 100-62.5-25 MCG/ACT IN AEPB: 1.0000 | INHALATION_SPRAY | Freq: Every day | RESPIRATORY_TRACT | 11 refills | Status: DC

## 2023-07-11 NOTE — Assessment & Plan Note (Signed)
Relatively uncontrolled  Elevated LDL to 127 and elevated cholesterol to 212  Patient has difficulty tolerating statins Continue Zetia  CTM

## 2023-07-11 NOTE — Telephone Encounter (Signed)
Received a fax from covermymeds for Ozempic (0.25 or 0.5mg /dose) 2mg /34ml  Key: [id number]

## 2023-07-11 NOTE — Assessment & Plan Note (Addendum)
Unsuccessful trials of gabapentin and Cymbalta were unsuccessful  Pt currently taking 1 25 mg tablet of Lyrica and tolerating it well  Pt reported intolerable side effects from dose of 50 mg of Lyrica  Continue current management and consider referral to pain management when patient is ready

## 2023-07-11 NOTE — Assessment & Plan Note (Addendum)
Continue to follow with nephrology  Cr was 1.77  Recheck Cr in 1-2 weeks (nephology will be doing this)

## 2023-07-11 NOTE — Assessment & Plan Note (Signed)
Well controlled. Continue current meds

## 2023-07-11 NOTE — Assessment & Plan Note (Signed)
Stable. Followed by optho

## 2023-07-11 NOTE — Assessment & Plan Note (Addendum)
Patient discontinued her Spiriva as she did not think it was helping Take Trelegy  Referral to pharmacy medication management

## 2023-07-11 NOTE — Progress Notes (Signed)
Annual Wellness Visit     Patient: Gloria Mercer, Female    DOB: 1941/04/16, 82 y.o.   MRN: [id number]  Subjective  Chief Complaint  Patient presents with   Medicare Wellness    Gloria Mercer is a 82 y.o. female who presents today for her Annual Wellness Visit. She reports consuming a general diet. Exercise is limited by orthopedic condition(s): chronic pain. She generally feels poorly. She reports sleeping fairly well. She does have additional problems to discuss today.   Gloria Mercer is here today for her annual wellness visit.  Overall she is not feeling the best. She has been having R back and hip pain for about a month. It is severely limiting her ability to get around. She could barely get into her car and cannot get into her bed so has been sleeping in a chair.  She has ben struggling with swelling due to conflicting opinions of her kidney and heart doctors. She wants to know if she can get on a weight loss drug per heart doctors recs.      Patient Active Problem List   Diagnosis Date Noted   Myalgia due to statin 05/24/2023   Chronic pain syndrome 04/19/2023   Chronic fatigue 04/19/2023   Primary osteoarthritis involving multiple joints 04/19/2023   Arthralgia of left temporomandibular joint 04/19/2023   Trigger middle finger of left hand 04/19/2023   Closed fracture of distal end of right fibula with routine healing 07/22/2022   Pancreatic lesion 04/02/2022   Spinal stenosis of lumbar region without neurogenic claudication 04/02/2022   Multiple pulmonary nodules 04/02/2022   Compression fracture of lumbar vertebra with routine healing 04/02/2022   Cervical lymphadenopathy 04/02/2022   Pseudophakia of right eye 09/07/2021   Preoperative evaluation of a medical condition to rule out surgical contraindications (TAR required) 09/02/2021   Sleep apnea 09/02/2021   CKD (chronic kidney disease) stage 3, GFR 30-59 ml/min (HCC) 09/02/2021   Edema, unspecified 06/25/2021    Heart failure, unspecified (HCC) 06/25/2021   Proteinuria, unspecified 06/25/2021   Stage 3b chronic kidney disease (HCC) 04/24/2021   Chronic instability of right knee 06/24/2020   Senile purpura (HCC) 05/06/2020   Chronic pain of both knees 05/05/2020   Chronic back pain 07/09/2018   Constipation 01/12/2018   RLS (restless legs syndrome) 08/29/2017   Pedal edema 08/29/2017   Hypertension associated with diabetes (HCC)    Primary osteoarthritis of right knee    Gastroesophageal reflux disease without esophagitis    Cataract    Macular degeneration    Allergic rhinitis    Hypertensive heart disease without heart failure 07/18/2017   Snoring 07/18/2017   T2DM (type 2 diabetes mellitus) (HCC) 07/05/2017   Colon polyps 01/12/2017   Diverticulosis of large intestine without hemorrhage 01/12/2017   External hemorrhoids 01/12/2017   Gastric nodule 01/12/2017   Hiatal hernia 01/12/2017   Internal hemorrhoids 01/12/2017   Lipoma of colon 01/12/2017   Multiple gastric polyps 01/12/2017   Schatzki's ring of distal esophagus 01/12/2017   Bloating 12/23/2016   Dysphagia 12/23/2016   Epigastric pain 12/23/2016   History of colonic polyps 12/23/2016   Rectal bleeding 12/23/2016   Dysphagia 12/23/2016   Chronic renal impairment, stage 3 (moderate) (HCC) 04/02/2016   Age-related nuclear cataract of both eyes 10/31/2015   Exudative age-related macular degeneration of right eye (HCC) 10/31/2015   Nuclear sclerotic cataract of both eyes 10/31/2015   Nonexudative age-related macular degeneration, left eye, advanced atrophic with subfoveal involvement 10/31/2015  Bronchitis, mucopurulent recurrent (HCC) 03/31/2015   Age-related macular degeneration, dry, left eye 02/18/2015   Age-related macular degeneration, wet, right eye (HCC) 02/18/2015   Morbid obesity (HCC) 05/23/2014   Diastolic dysfunction 07/27/2013   Intertriginous candidiasis 06/16/2013   Hyperlipidemia associated with type 2  diabetes mellitus (HCC) 10/19/2012   PA (pernicious anemia) 10/19/2012   Vitamin D deficiency 10/19/2012      Medications: Outpatient Medications Prior to Visit  Medication Sig   azelastine (ASTELIN) 0.1 % nasal spray USE 2 SPRAYS IN EACH NOSTRIL DAILY   bisacodyl (DULCOLAX) 5 MG EC tablet Take 5 mg by mouth daily as needed for moderate constipation.   Cholecalciferol (VITAMIN D3) 5000 units CAPS Take 1 capsule by mouth daily.   clotrimazole-betamethasone (LOTRISONE) cream APPLY EXTERNALLY TO THE AFFECTED AREA TWICE DAILY AS NEEDED   empagliflozin (JARDIANCE) 10 MG TABS tablet Take 1 tablet (10 mg total) by mouth daily before breakfast.   esomeprazole (NEXIUM) 20 MG capsule Take 40 mg by mouth daily at 12 noon.    ezetimibe (ZETIA) 10 MG tablet TAKE 1 TABLET(10 MG) BY MOUTH DAILY   Furosemide (FUROSCIX) 80 MG/10ML CTKT Inject 1 Dose into the skin daily as needed (for swelling). Call the [facility] for further instruction before taking this medicine   ipratropium (ATROVENT) 0.03 % nasal spray USE 2 SPRAYS IN EACH NOSTRIL EVERY 8 HOURS AS NEEDED   levocetirizine (XYZAL) 5 MG tablet Take 5 mg by mouth every evening.   metolazone (ZAROXOLYN) 2.5 MG tablet Take 2.5 mg by mouth. Tuesday and Saturday   montelukast (SINGULAIR) 10 MG tablet TAKE 1 TABLET(10 MG) BY MOUTH DAILY   Multiple Vitamins-Minerals (EYE VITAMINS PO) Take 2 tablets by mouth daily. Ocuvite 50+   polyethylene glycol (MIRALAX / GLYCOLAX) 17 g packet Take 17 g by mouth daily as needed.   potassium chloride SA (KLOR-CON M) 20 MEQ tablet Take 2 tablets (40 mEq total) by mouth 2 (two) times daily.   pramipexole (MIRAPEX) 0.75 MG tablet TAKE 1 TABLET(0.75 MG) BY MOUTH AT BEDTIME   pregabalin (LYRICA) 25 MG capsule Take 1 capsule (25 mg total) by mouth 2 (two) times daily.   spironolactone (ALDACTONE) 25 MG tablet Take 1 tablet (25 mg total) by mouth daily.   Tiotropium Bromide Monohydrate (SPIRIVA RESPIMAT) 2.5 MCG/ACT AERS  INHALE 2 PUFFS INTO THE LUNGS DAILY (Patient not taking: Reported on 05/24/2023)   torsemide (DEMADEX) 100 MG tablet Take 1.5 tablets (150 mg total) by mouth daily. (Patient taking differently: Take 100 mg by mouth daily.)   triamcinolone (NASACORT) 55 MCG/ACT AERO nasal inhaler Place 2 sprays into the nose daily.   triamcinolone ointment (KENALOG) 0.5 % Apply 1 Application topically 2 (two) times daily as needed.   No facility-administered medications prior to visit.    Allergies  Allergen Reactions   Erythromycin Other (See Comments)    Other reaction(s): Headache Headache and GI upset Headache and GI upset   Losartan Nausea And Vomiting   Metoprolol Nausea And Vomiting   Simvastatin Other (See Comments)    Other reaction(s): Muscle Pain Pain lower extremities Pain lower extremities   Dexlansoprazole Diarrhea   Lisinopril Cough   Prednisone     Other reaction(s): Constipation GI upset    Patient Care Team: Erasmo Downer, MD as PCP - General (Family Medicine) Debbe Odea, MD as PCP - Cardiology (Cardiology) Mettu, Renee Harder, MD as Referring Physician (Ophthalmology) Gaspar Cola, [facility] (Inactive) as Pharmacist (Pharmacist)  Review of Systems  Cardiovascular:  Positive for leg swelling.  Musculoskeletal:  Positive for back pain, joint pain and myalgias.  Psychiatric/Behavioral:  The patient does not have insomnia.       Objective  BP 125/66 (BP Location: Left Arm, Patient Position: Sitting, Cuff Size: Large)   Pulse 88   Temp 98.1 F (36.7 C) (Temporal)   Resp 20   Ht 5\' 1"  (1.549 m)   Wt 235 lb 4.8 oz (106.7 kg)   SpO2 95%   BMI 44.46 kg/m  BP Readings from Last 3 Encounters:  07/11/23 125/66  06/16/23 133/64  05/24/23 (!) 106/39   Wt Readings from Last 3 Encounters:  07/11/23 235 lb 4.8 oz (106.7 kg)  06/16/23 241 lb 6.4 oz (109.5 kg)  05/24/23 237 lb 3.2 oz (107.6 kg)      Physical Exam Constitutional:      Appearance: Normal  appearance.  HENT:     Head: Normocephalic and atraumatic.  Eyes:     Conjunctiva/sclera: Conjunctivae normal.     Pupils: Pupils are equal, round, and reactive to light.  Cardiovascular:     Rate and Rhythm: Normal rate and regular rhythm.     Heart sounds: Normal heart sounds.  Pulmonary:     Effort: Pulmonary effort is normal.     Breath sounds: Normal breath sounds.  Musculoskeletal:     Right hip: Decreased range of motion.  Neurological:     General: No focal deficit present.     Mental Status: She is alert and oriented to person, place, and time.       Most recent functional status assessment:    07/11/2023    3:01 PM  In your present state of health, do you have any difficulty performing the following activities:  Hearing? 0  Vision? 1  Difficulty concentrating or making decisions? 0  Walking or climbing stairs? 1  Dressing or bathing? 1  Doing errands, shopping? 1   Most recent fall risk assessment:    07/11/2023    3:01 PM  Fall Risk   Falls in the past year? 1  Number falls in past yr: 0  Injury with Fall? 1  Risk for fall due to : No Fall Risks  Follow up Falls evaluation completed    Most recent depression screenings:    07/11/2023    3:01 PM 05/24/2023    3:19 PM  PHQ 2/9 Scores  PHQ - 2 Score 4 2  PHQ- 9 Score 11 12   Most recent cognitive screening:    09/16/2020    1:38 PM  6CIT Screen  What Year? 0 points  What month? 0 points  What time? 0 points  Count back from 20 0 points  Months in reverse 0 points  Repeat phrase 0 points  Total Score 0 points   Most recent Audit-C alcohol use screening    07/11/2023    3:01 PM  Alcohol Use Disorder Test (AUDIT)  1. How often do you have a drink containing alcohol? 0  2. How many drinks containing alcohol do you have on a typical day when you are drinking? 0  3. How often do you have six or more drinks on one occasion? 0  AUDIT-C Score 0   A score of 3 or more in women, and 4 or more in  men indicates increased risk for alcohol abuse, EXCEPT if all of the points are from question 1   Last CBC Lab Results  Component Value Date  WBC 10.0 08/12/2022   HGB 12.8 08/12/2022   HCT 37.2 08/12/2022   MCV 88.8 08/12/2022   MCH 30.5 08/12/2022   RDW 12.9 08/12/2022   PLT 270 08/12/2022   Last metabolic panel Lab Results  Component Value Date   GLUCOSE 157 (H) 07/01/2023   NA 135 07/01/2023   K 3.9 07/01/2023   CL 90 (L) 07/01/2023   CO2 28 07/01/2023   BUN 46 (H) 07/01/2023   CREATININE 1.77 (H) 07/01/2023   EGFR 28 (L) 07/01/2023   CALCIUM 9.6 07/01/2023   PROT 6.3 07/01/2023   ALBUMIN 4.0 07/01/2023   LABGLOB 2.3 07/01/2023   AGRATIO 1.8 12/28/2022   BILITOT 0.6 07/01/2023   ALKPHOS 137 (H) 07/01/2023   AST 11 07/01/2023   ALT 14 07/01/2023   ANIONGAP 10 06/16/2023   Last lipids Lab Results  Component Value Date   CHOL 212 (H) 07/01/2023   HDL 61 07/01/2023   LDLCALC 127 (H) 07/01/2023   TRIG 137 07/01/2023   CHOLHDL 3.5 07/01/2023   Last hemoglobin A1c Lab Results  Component Value Date   HGBA1C 6.6 (H) 07/01/2023      No results found for any visits on 07/11/23.    Assessment & Plan   Annual wellness visit done today including the all of the following: Reviewed patient's Family Medical History Reviewed and updated list of patient's medical providers Assessment of cognitive impairment was done Assessed patient's functional ability Established a written schedule for health screening services Health Risk Assessent Completed and Reviewed  Exercise Activities and Dietary recommendations  Goals      DIET - EAT MORE FRUITS AND VEGETABLES     DIET - REDUCE SUGAR INTAKE     Recommend to cut out sodas, sugar foods and bread to help aid in weight loss.         Immunization History  Administered Date(s) Administered   Fluad Quad(high Dose 65+) 12/05/2019, 09/29/2020, 10/02/2021, 09/07/2022   Influenza Split 10/27/2010   Influenza, High Dose  Seasonal PF 10/10/2013, 11/06/2014, 09/22/2015, 08/29/2017, 09/27/2018   Influenza, Seasonal, Injecte, Preservative Fre 08/31/2015   Influenza,trivalent, recombinat, inj, PF 11/07/2012   Influenza-Unspecified 11/07/2012, 10/10/2013, 08/31/2015, 09/17/2016, 09/27/2016   PFIZER Comirnaty(Gray Top)Covid-19 Tri-Sucrose Vaccine 12/25/2019, 01/15/2020, 09/01/2020   PFIZER(Purple Top)SARS-COV-2 Vaccination 12/25/2019, 01/15/2020   Pneumococcal Conjugate-13 06/25/2014   Pneumococcal Polysaccharide-23 11/30/2007   Pneumococcal-Unspecified 05/31/2015   Tdap 11/08/2008   Zoster Recombinant(Shingrix) 07/05/2017, 02/05/2018   Zoster, Live 05/24/2014    Health Maintenance  Topic Date Due   OPHTHALMOLOGY EXAM  Never done   DTaP/Tdap/Td (2 - Td or Tdap) 11/08/2018   COVID-19 Vaccine (6 - 2023-24 season) 07/30/2022   Medicare Annual Wellness (AWV)  02/25/2023   INFLUENZA VACCINE  06/30/2023   HEMOGLOBIN A1C  01/01/2024   Diabetic kidney evaluation - Urine ACR  05/23/2024   FOOT EXAM  05/23/2024   Diabetic kidney evaluation - eGFR measurement  06/30/2024   Pneumonia Vaccine 48+ Years old  Completed   DEXA SCAN  Completed   Zoster Vaccines- Shingrix  Completed   HPV VACCINES  Aged Out     Discussed health benefits of physical activity, and encouraged her to engage in regular exercise appropriate for her age and condition.    Problem List Items Addressed This Visit       Cardiovascular and Mediastinum   Hypertension associated with diabetes (HCC)    Well controlled  Continue current meds      Relevant Medications   Semaglutide,0.25 or  0.5MG /DOS, (OZEMPIC, 0.25 OR 0.5 MG/DOSE,) 2 MG/3ML SOPN   Other Relevant Orders   AMB Referral to Pharmacy Medication Management     Respiratory   Bronchitis, mucopurulent recurrent (HCC)    Patient discontinued her Spiriva as she did not think it was helping Take Trelegy  Referral to pharmacy medication management        Relevant Orders   AMB  Referral to Pharmacy Medication Management     Endocrine   Hyperlipidemia associated with type 2 diabetes mellitus (HCC)    Relatively uncontrolled  Elevated LDL to 127 and elevated cholesterol to 212  Patient has difficulty tolerating statins Continue Zetia  CTM      Relevant Medications   Semaglutide,0.25 or 0.5MG /DOS, (OZEMPIC, 0.25 OR 0.5 MG/DOSE,) 2 MG/3ML SOPN   Other Relevant Orders   AMB Referral to Pharmacy Medication Management     Genitourinary   Stage 3b chronic kidney disease (HCC)    Continue to follow with nephrology  Cr was 1.77  Recheck Cr in 1-2 weeks (nephology will be doing this)      Relevant Orders   AMB Referral to Pharmacy Medication Management     Other   Exudative age-related macular degeneration of right eye (HCC)    Stable  Followed by optho      Morbid obesity (HCC)    Dicussed importance of lifestyle modifications Movement is limited by chronic pain and arthritis       Relevant Medications   Semaglutide,0.25 or 0.5MG /DOS, (OZEMPIC, 0.25 OR 0.5 MG/DOSE,) 2 MG/3ML SOPN   Other Relevant Orders   AMB Referral to Pharmacy Medication Management   Chronic pain syndrome    Unsuccessful trials of gabapentin and Cymbalta were unsuccessful  Pt currently taking 1 25 mg tablet of Lyrica and tolerating it well  Pt reported intolerable side effects from dose of 50 mg of Lyrica  Continue current management and consider referral to pain management when patient is ready      Relevant Orders   AMB Referral to Pharmacy Medication Management   Other Visit Diagnoses     Encounter for annual wellness visit (AWV) in Medicare patient    -  Primary       Return in about 3 months (around 10/11/2023) for chronic disease f/u.     Rometta Emery, Medical Student   Patient seen along with MS3 student Jodi Marble. I personally evaluated this patient along with the student, and verified all aspects of the history, physical exam, and medical decision  making as documented by the student. I agree with the student's documentation and have made all necessary edits.  , Marzella Schlein, MD, MPH [facility]

## 2023-07-11 NOTE — Assessment & Plan Note (Addendum)
Stable  A1c was 6.6 Take Ozempic 0.25 mg once weekly  Referral to pharmacy medication management to help with affordability

## 2023-07-11 NOTE — Assessment & Plan Note (Signed)
Dicussed importance of lifestyle modifications Movement is limited by chronic pain and arthritis

## 2023-07-11 NOTE — Assessment & Plan Note (Signed)
Stable  Was having issues with swelling because kidney doctors stopped nightly dose of torsemide, heart doctors instructed her to start back on nightly dose  Swelling has improved since resuming torsemide

## 2023-07-13 ENCOUNTER — Telehealth: Payer: Self-pay | Admitting: Pharmacist

## 2023-07-13 NOTE — Progress Notes (Signed)
   Outreach Note  07/13/2023 Name: LAIKEN LADIN MRN: [id number] DOB: 08/20/1941  Referred by: Erasmo Downer, MD Reason for referral : Medication Assistance/Medication Management   Was unable to reach patient via telephone today and unable to leave a message as no voicemail is setup   Follow Up Plan: Will collaborate with Care Guide to outreach to schedule an appointment with me  Estelle Grumbles, PharmD, [facility] Clinical Pharmacist [facility] [phone]

## 2023-07-13 NOTE — Telephone Encounter (Signed)
PA initiated

## 2023-07-14 DIAGNOSIS — N1832 Chronic kidney disease, stage 3b: Secondary | ICD-10-CM | POA: Diagnosis not present

## 2023-07-14 DIAGNOSIS — I129 Hypertensive chronic kidney disease with stage 1 through stage 4 chronic kidney disease, or unspecified chronic kidney disease: Secondary | ICD-10-CM | POA: Diagnosis not present

## 2023-07-14 DIAGNOSIS — R600 Localized edema: Secondary | ICD-10-CM | POA: Diagnosis not present

## 2023-07-14 NOTE — Progress Notes (Signed)
Attempted to contact patient for scheduled appointment for medication management. Someone picked up the phone and immediately hung up twice.   Will attempt one more outreach within 3 - 7 business days.  Catie Eppie Gibson, PharmD, BCACP, CPP Clinical Pharmacist [facility] [phone]

## 2023-07-15 NOTE — Telephone Encounter (Signed)
Message from Plan Approved. . Authorization Expiration Date: July 13, 2024.  Called patient No answer.Voicemail not set up yet.

## 2023-07-18 DIAGNOSIS — R600 Localized edema: Secondary | ICD-10-CM | POA: Diagnosis not present

## 2023-07-18 DIAGNOSIS — E790 Hyperuricemia without signs of inflammatory arthritis and tophaceous disease: Secondary | ICD-10-CM | POA: Diagnosis not present

## 2023-07-18 DIAGNOSIS — E876 Hypokalemia: Secondary | ICD-10-CM | POA: Diagnosis not present

## 2023-07-18 DIAGNOSIS — I129 Hypertensive chronic kidney disease with stage 1 through stage 4 chronic kidney disease, or unspecified chronic kidney disease: Secondary | ICD-10-CM | POA: Diagnosis not present

## 2023-07-18 DIAGNOSIS — N1832 Chronic kidney disease, stage 3b: Secondary | ICD-10-CM | POA: Diagnosis not present

## 2023-07-18 DIAGNOSIS — N2581 Secondary hyperparathyroidism of renal origin: Secondary | ICD-10-CM | POA: Diagnosis not present

## 2023-07-29 ENCOUNTER — Telehealth: Payer: Self-pay

## 2023-07-29 ENCOUNTER — Other Ambulatory Visit: Payer: Medicare Other | Admitting: Pharmacist

## 2023-07-29 ENCOUNTER — Encounter: Payer: Self-pay | Admitting: Pharmacist

## 2023-07-29 NOTE — Patient Instructions (Signed)
Goals Addressed             This Visit's Progress    Pharmacy Goals       It was a pleasure speaking with you today!    Ozempic Patient Assistance:   Please watch the mail for an envelope from [employer] containing the patient assistance program application. Please complete this application and mail back to [facility] Pharmacy Technician Noreene Larsson [place] along with a copy of your Medicare Part D prescription card and a copy of your proof of income document OR you can bring these documents to the office to have them faxed back to Attention: Pattricia Boss at Fax # [phone]   If you need to call Noreene Larsson, you can reach her at [phone]   If you need to reach out to patient assistance programs regarding refills or to find out the status of your application, you can do so by calling:   [employer] at [phone]     [employer]:   As we discussed, these grants are not currently open.   However, please visit the [employer] website to complete enrollment for the Heart Failure Grant waitlist at:   [email or web address]   To apply by phone, you can call [phone] Monday through Friday, 9 a.m. to 5:30 p.m. ET.   Also, the following is the link for the [employer] Chronic Heart Failure Fund. This fund is currently closed, but there is an option through the link below to sign up for their "Fund Alerts" to be notified when it is open again.   [email or web address]   [employer] Phone number: [phone]   Thank you!   Estelle Grumbles, PharmD, [facility] [phone]

## 2023-07-29 NOTE — Telephone Encounter (Signed)
Copied from CRM [id number]. Topic: General - Other >> Jul 29, 2023 10:40 AM Everette C wrote: Reason for CRM: The patient has been directed by their pharmacist to call and expressly request direct contact with their PCP regarding constipation concerns   Please contact further when possible

## 2023-07-29 NOTE — Progress Notes (Signed)
07/29/2023 Name: [name] MRN: [id number] DOB: 11-11-1941  Chief Complaint  Patient presents with   Medication Assistance    Gloria Mercer is a 82 y.o. year old female who presented for a telephone visit.   They were referred to the pharmacist by their PCP for assistance in managing medication access.   Patient reports not a good time to review medications today; request to review during next telephone appointment  Subjective:  Care Team: Primary Care Provider: Erasmo Downer, MD ; Next Scheduled Visit: 10/11/2023 Cardiologist: Gloria Odea, MD; Next Scheduled Visit: 08/26/2023 Heart Failure Specialist: Gloria Patty, MD; Next Scheduled Visit: 08/15/2023 Nephrologist: Gloria Pigeon, MD; Next Scheduled Visit: 12/12/2023  Medication Access/Adherence  Current Pharmacy:  [id number] [place],  - [address] AT [facility] [address] [place] [address] Phone: [phone] Fax: [phone]   Patient reports affordability concerns with their medications: Yes  Patient reports access/transportation concerns to their pharmacy: No  Patient reports adherence concerns with their medications:  No    Reports cost of Ozempic and Jardiance are difficult to afford  Patient reports she having significant trouble with constipation over past few days. Reports had some relief/passing of stool this morning. Says that this has been a chronic issue for her, but until a couple of days ago, had been controlled with consistent use of Miralax and as needed bisacodyl, as recommended by her PCP.  Diabetes:  Current medications: Jardiance 10 mg daily  Reports interested in starting Ozempic for cardiovascular and renal benefits, in addition to blood sugar control. Reports has been unable to pick up prescription that PCP sent to pharmacy for her due to cost of this medication  Denies currently monitoring home blood  sugar  Current physical activity: reports stays active around her home each day  Current medication access support: none;  Based on reported income, patient meets criteria for Ozempic patient assistance from [employer]    Objective:  Lab Results  Component Value Date   HGBA1C 6.6 (H) 07/01/2023    Lab Results  Component Value Date   CREATININE 1.77 (H) 07/01/2023   BUN 46 (H) 07/01/2023   NA 135 07/01/2023   K 3.9 07/01/2023   CL 90 (L) 07/01/2023   CO2 28 07/01/2023   Outpatient Encounter Medications as of 07/29/2023  Medication Sig   empagliflozin (JARDIANCE) 10 MG TABS tablet Take 1 tablet (10 mg total) by mouth daily before breakfast.   azelastine (ASTELIN) 0.1 % nasal spray USE 2 SPRAYS IN EACH NOSTRIL DAILY   bisacodyl (DULCOLAX) 5 MG EC tablet Take 5 mg by mouth daily as needed for moderate constipation.   Cholecalciferol (VITAMIN D3) 5000 units CAPS Take 1 capsule by mouth daily.   clotrimazole-betamethasone (LOTRISONE) cream APPLY EXTERNALLY TO THE AFFECTED AREA TWICE DAILY AS NEEDED   esomeprazole (NEXIUM) 20 MG capsule Take 40 mg by mouth daily at 12 noon.    ezetimibe (ZETIA) 10 MG tablet TAKE 1 TABLET(10 MG) BY MOUTH DAILY   Fluticasone-Umeclidin-Vilant (TRELEGY ELLIPTA) 100-62.5-25 MCG/ACT AEPB Inhale 1 puff into the lungs daily.   Furosemide (FUROSCIX) 80 MG/10ML CTKT Inject 1 Dose into the skin daily as needed (for swelling). Call the Heart Failure Clinic for further instruction before taking this medicine   ipratropium (ATROVENT) 0.03 % nasal spray USE 2 SPRAYS IN EACH NOSTRIL EVERY 8 HOURS AS NEEDED   levocetirizine (XYZAL) 5 MG tablet Take 5 mg  by mouth every evening.   metolazone (ZAROXOLYN) 2.5 MG tablet Take 2.5 mg by mouth. Tuesday and Saturday   montelukast (SINGULAIR) 10 MG tablet TAKE 1 TABLET(10 MG) BY MOUTH DAILY   Multiple Vitamins-Minerals (EYE VITAMINS PO) Take 2 tablets by mouth daily. Ocuvite 50+   polyethylene glycol (MIRALAX / GLYCOLAX) 17  g packet Take 17 g by mouth daily as needed.   potassium chloride SA (KLOR-CON M) 20 MEQ tablet Take 2 tablets (40 mEq total) by mouth 2 (two) times daily.   pramipexole (MIRAPEX) 0.75 MG tablet TAKE 1 TABLET(0.75 MG) BY MOUTH AT BEDTIME   pregabalin (LYRICA) 25 MG capsule Take 1 capsule (25 mg total) by mouth 2 (two) times daily.   Semaglutide,0.25 or 0.5MG /DOS, (OZEMPIC, 0.25 OR 0.5 MG/DOSE,) 2 MG/3ML SOPN Inject 0.25 mg into the skin once a week for 14 days, THEN 0.5 mg once a week for 14 days.   spironolactone (ALDACTONE) 25 MG tablet Take 1 tablet (25 mg total) by mouth daily.   torsemide (DEMADEX) 100 MG tablet Take 1.5 tablets (150 mg total) by mouth daily. (Patient taking differently: Take 100 mg by mouth daily.)   triamcinolone (NASACORT) 55 MCG/ACT AERO nasal inhaler Place 2 sprays into the nose daily.   triamcinolone ointment (KENALOG) 0.5 % Apply 1 Application topically 2 (two) times daily as needed.   No facility-administered encounter medications on file as of 07/29/2023.       Assessment/Plan:   Reports not a good time to review medications today; request to review during next telephone appointment  Encourage patient to follow up with her PCP office today regarding recent return of constipation  Diabetes: - Currently controlled - Meets financial criteria for Ozempic patient assistance program through [employer]. Will collaborate with provider, CPhT, and patient to pursue assistance.  -Counseled on Ozempic, including side effects, and benefits. No personal or family history of medullary thyroid cancer or personal history of pancreatitis. Counseled on potential side effects of nausea, stomach upset, queasiness, constipation, and that these generally improve over time. Advised to contact our office with more severe symptoms, including nausea, diarrhea, stomach pain. Patient verbalized understanding. Patient reports history of gallstones. Share with patient that GLP-1RAs linked  to increased risk of gallstone formation/gall bladder disease. Patient verbalizes understanding. Wishes to proceed with plan to start Ozempic.  Will collaborate with PCP - Based on reported income, patient does not meet criteria for Jardiance patient assistance from [facility] or for therapeutic alternative manufacturer program - Patient would meet income limit for either [employer] or [employer] Heart Failure grant programs, but note that both grant funds are currently closed Encourage patient to sign up for [employer] grant waitlist and [employer] grant alert  Will send patient MyChart message with this information as requested   Follow Up Plan: Clinical Pharmacist will follow up with patient by telephone on 9/30 at 2:30 pm   Estelle Grumbles, PharmD, [facility] [phone]

## 2023-08-02 ENCOUNTER — Other Ambulatory Visit: Payer: Self-pay | Admitting: Family Medicine

## 2023-08-02 NOTE — Telephone Encounter (Signed)
Does she need an appt for her constipation?

## 2023-08-02 NOTE — Telephone Encounter (Signed)
Lmtcb.

## 2023-08-03 ENCOUNTER — Telehealth: Payer: Self-pay | Admitting: Family Medicine

## 2023-08-03 DIAGNOSIS — E7800 Pure hypercholesterolemia, unspecified: Secondary | ICD-10-CM

## 2023-08-03 MED ORDER — EZETIMIBE 10 MG PO TABS
ORAL_TABLET | ORAL | 2 refills | Status: DC
Start: 2023-08-03 — End: 2024-05-07

## 2023-08-03 NOTE — Telephone Encounter (Signed)
Patient reports she took some OTC medications and is not constipated. Patient did not want to schedule an appt. Advised to call back if OTC medications are not helping.

## 2023-08-03 NOTE — Telephone Encounter (Signed)
[facility] pharmacy is requesting prescription refill ezetimibe (ZETIA) 10 MG tablet  Please advise

## 2023-08-04 ENCOUNTER — Telehealth: Payer: Self-pay | Admitting: Pharmacy Technician

## 2023-08-04 DIAGNOSIS — Z5986 Financial insecurity: Secondary | ICD-10-CM

## 2023-08-04 NOTE — Progress Notes (Signed)
[facility] Customer service manager [facility])                                            [facility] Quality Pharmacy Team    08/04/2023  SANDRALEE FOTO 1941-09-06 [id number]                                     Medication Assistance Referral  Referral From:  [facility] PharmD Estelle Grumbles  Medication/Company: Franki Monte / Novo Nordisk Patient application portion:  Mailed Provider application portion: Faxed  to Dr. [name] Provider address/fax verified via: Office website   Pattricia Boss, CPhT [place]  Office: [phone] Fax: [phone] Email: [email or web address]

## 2023-08-15 ENCOUNTER — Other Ambulatory Visit: Payer: Self-pay

## 2023-08-15 ENCOUNTER — Encounter: Payer: Medicare Other | Admitting: Internal Medicine

## 2023-08-15 MED ORDER — SPIRONOLACTONE 25 MG PO TABS: 25.0000 mg | ORAL_TABLET | Freq: Every day | ORAL | 3 refills | Status: DC

## 2023-08-26 ENCOUNTER — Ambulatory Visit: Payer: Medicare Other | Admitting: Cardiology

## 2023-08-29 ENCOUNTER — Telehealth: Payer: Self-pay | Admitting: Cardiology

## 2023-08-29 ENCOUNTER — Other Ambulatory Visit: Payer: Medicare Other | Admitting: Pharmacist

## 2023-08-29 ENCOUNTER — Encounter: Payer: Self-pay | Admitting: Pharmacist

## 2023-08-29 MED ORDER — POTASSIUM CHLORIDE CRYS ER 20 MEQ PO TBCR: 40.0000 meq | EXTENDED_RELEASE_TABLET | Freq: Two times a day (BID) | ORAL | 0 refills | Status: DC

## 2023-08-29 NOTE — Telephone Encounter (Signed)
*  STAT* If patient is at the pharmacy, call can be transferred to refill team.   1. Which medications need to be refilled? (please list name of each medication and dose if known) Potassium Chloride   2. Would you like to learn more about the convenience, safety, & potential cost savings by using the [facility]?    3. Are you open to using the [facility] (Type [facility].    4. Which pharmacy/location (including street and city if local pharmacy) is medication to be sent to? [facility]  [address], [place]   5. Do they need a 30 day or 90 day supply? 90 days and refills- please call today, out of medicine

## 2023-08-29 NOTE — Patient Instructions (Signed)
Goals Addressed             This Visit's Progress    Pharmacy Goals       It was a pleasure speaking with you today!    Ozempic Patient Assistance:   Please watch the mail for an envelope from [employer] containing the patient assistance program application. Please complete this application and mail back to [facility] Pharmacy Technician Noreene Larsson [place] along with a copy of your Medicare Part D prescription card and a copy of your proof of income document OR you can bring these documents to the office to have them faxed back to Attention: Pattricia Boss at Fax # [phone]   If you need to call Noreene Larsson, you can reach her at [phone]   If you need to reach out to patient assistance programs regarding refills or to find out the status of your application, you can do so by calling:   [employer] at [phone]     [employer]:   As we discussed, these grants are not currently open.   However, please visit the [employer] website to complete enrollment for the Heart Failure Grant waitlist at:   [email or web address]   To apply by phone, you can call [phone] Monday through Friday, 9 a.m. to 5:30 p.m. ET.   Also, the following is the link for the [employer] Chronic Heart Failure Fund. This fund is currently closed, but there is an option through the link below to sign up for their "Fund Alerts" to be notified when it is open again.   [email or web address]   [employer] Phone number: [phone]   Thank you!   Estelle Grumbles, PharmD, [facility] [phone]

## 2023-08-29 NOTE — Progress Notes (Unsigned)
08/29/2023 Name: Gloria Mercer MRN: [id number] DOB: 1941-08-05  Chief Complaint  Patient presents with   Medication Management   Medication Assistance    Gloria Mercer is a 82 y.o. year old female who presented for a telephone visit.   They were referred to the pharmacist by their PCP for assistance in managing medication access.    Subjective:  Care Team: Primary Care Provider: Erasmo Downer, MD ; Next Scheduled Visit: 09/01/2023 Cardiologist: Debbe Odea, MD; Next Scheduled Visit: 10/05/2023 Heart Failure Specialist: Dolores Patty, MD; Next Scheduled Visit: 09/08/2023 Nephrologist: Mosetta Pigeon, MD; Next Scheduled Visit: 12/12/2023  Medication Access/Adherence  Current Pharmacy:  [id number] [place], [place] - [address] AT [facility] OF [address] [place] [address] Phone: [phone] Fax: [phone]   Patient reports affordability concerns with their medications: Yes  Patient reports access/transportation concerns to their pharmacy: No  Patient reports adherence concerns with their medications:  No     Reports cost of Ozempic and Jardiance are difficult to afford   Reports having back pain recently. She has contacted PCP office and has an appointment to see provider on Thursday.   Diabetes:   Current medications: Jardiance 10 mg daily   Patient interested in starting Ozempic for cardiovascular and renal benefits, in addition to blood sugar control. She has been unable to pick up prescription that PCP sent to pharmacy for her due to cost of this medication   Denies currently monitoring home blood sugar   Current physical activity: reports stays active around her home each day   Current medication access support: collaborating with PCP and CPhT to aid patient in applying for Ozempic patient assistance from [employer] Today patient advises that she has not yet returned this  application to CPhT as she had questions about filling out the application   Heart Failure:  Current medications:   Patient followed by [facility] and [facility]  SGLT2i: Jardiance 10 mg daily Mineralocorticoid Receptor Antagonist: spironolactone 25 mg daily Diuretic regimen:  Metolazone 2.5 mg twice weekly on Tuesdays and Saturdays Torsemide 100 mg - 1 tablet each morning and 1/2 tablet each evening Reports has Furoscix injection at home to use as directed by Heart Failure Clinic  Current home blood pressure readings: checking but does not recall reading Reports monitors home weight daily  Reports legs just a little swollen today, reports at baseline for her  Current physical activity: reports stays active around her home each day    Objective:  Lab Results  Component Value Date   HGBA1C 6.6 (H) 07/01/2023    Lab Results  Component Value Date   CREATININE 1.77 (H) 07/01/2023   BUN 46 (H) 07/01/2023   NA 135 07/01/2023   K 3.9 07/01/2023   CL 90 (L) 07/01/2023   CO2 28 07/01/2023  Per shared record from [facility], on 07/14/2023: creatinine: 1.64 mg/dL; eGFR ~57 Calculate CrCl (using adjusted body weight) ~30 ml/min   Lab Results  Component Value Date   CHOL 212 (H) 07/01/2023   HDL 61 07/01/2023   LDLCALC 127 (H) 07/01/2023   TRIG 137 07/01/2023   CHOLHDL 3.5 07/01/2023   BP Readings from Last 3 Encounters:  07/11/23 125/66  06/16/23 133/64  05/24/23 (!) 106/39   Pulse Readings from Last 3 Encounters:  07/11/23 88  06/16/23 84  05/24/23 79     Medications Reviewed Today  Reviewed by Manuela Neptune, RPH-CPP (Pharmacist) on 08/29/23 at 1523  Med List Status: <None>   Medication Order Taking? Sig Documenting Provider Last Dose Status Informant  azelastine (ASTELIN) 0.1 % nasal spray [id number] Yes USE 2 SPRAYS IN EACH NOSTRIL DAILY Drubel, Lillia Abed, PA-C Taking Active   bisacodyl (DULCOLAX) 5 MG EC tablet  [id number]  Take 5 mg by mouth daily as needed for moderate constipation. [provider]  Active   Cholecalciferol (VITAMIN D3) 5000 units CAPS [id number] Yes Take 1 capsule by mouth daily. [provider] Taking Active   clotrimazole-betamethasone (LOTRISONE) cream [id number]  APPLY EXTERNALLY TO THE AFFECTED AREA TWICE DAILY AS NEEDED Beryle Flock, Marzella Schlein, MD  Active   empagliflozin (JARDIANCE) 10 MG TABS tablet [id number] Yes Take 1 tablet (10 mg total) by mouth daily before breakfast. Bensimhon, Bevelyn Buckles, MD Taking Active   esomeprazole (NEXIUM) 20 MG capsule [id number] Yes Take 40 mg by mouth daily at 12 noon.  [provider] Taking Active   ezetimibe (ZETIA) 10 MG tablet [id number] Yes TAKE 1 TABLET(10 MG) BY MOUTH DAILY [name], Marzella Schlein, MD Taking Active   Fluticasone-Umeclidin-Vilant (TRELEGY ELLIPTA) 100-62.5-25 MCG/ACT AEPB [id number] No Inhale 1 puff into the lungs daily.  Patient not taking: Reported on 08/29/2023   Erasmo Downer, MD Not Taking Active   Furosemide (FUROSCIX) 80 MG/10ML CTKT [id number]  Inject 1 Dose into the skin daily as needed (for swelling). Call the Heart Failure Clinic for further instruction before taking this medicine [provider]  Active   ipratropium (ATROVENT) 0.03 % nasal spray [id number] Yes USE 2 SPRAYS IN EACH NOSTRIL EVERY 8 HOURS AS NEEDED [name], Marzella Schlein, MD Taking Active   levocetirizine (XYZAL) 5 MG tablet [id number]  Take 2.5 mg by mouth every other day as needed for allergies. [provider]  Active   metolazone (ZAROXOLYN) 2.5 MG tablet [id number] Yes Take 2.5 mg by mouth. Tuesday and Saturday [provider] Taking Active   montelukast (SINGULAIR) 10 MG tablet [id number] Yes TAKE 1 TABLET(10 MG) BY MOUTH DAILY [name], Marzella Schlein, MD Taking Active   Multiple Vitamins-Minerals (EYE VITAMINS PO) [id number] Yes Take 2 tablets by mouth daily. Ocuvite 50+ [provider] Taking Active    polyethylene glycol (MIRALAX / GLYCOLAX) 17 g packet [id number] Yes Take 17 g by mouth daily as needed. [provider] Taking Active   potassium chloride SA (KLOR-CON M) 20 MEQ tablet [id number] Yes Take 2 tablets (40 mEq total) by mouth 2 (two) times daily. Debbe Odea, MD Taking Active   pramipexole (MIRAPEX) 0.75 MG tablet [id number] Yes TAKE 1 TABLET(0.75 MG) BY MOUTH AT BEDTIME [name], Marzella Schlein, MD Taking Active   pregabalin (LYRICA) 25 MG capsule [id number] No Take 1 capsule (25 mg total) by mouth 2 (two) times daily.  Patient not taking: Reported on 08/29/2023   Erasmo Downer, MD Not Taking Active   spironolactone (ALDACTONE) 25 MG tablet [id number] Yes Take 1 tablet (25 mg total) by mouth daily. Debbe Odea, MD Taking Active   torsemide [facility]) 100 MG tablet [id number] Yes Take 1.5 tablets (150 mg total) by mouth daily.  Patient taking differently: Take 100 mg by mouth daily. 1 tablet (100 mg) each morning and 1/2 tablet (50 mg) at bedtime   Debbe Odea, MD Taking Active   triamcinolone (NASACORT) 55 MCG/ACT AERO nasal inhaler [id number] Yes Place 2 sprays into the nose daily. [provider] Taking Active   triamcinolone ointment (KENALOG) 0.5 % [id number]  Apply 1 Application topically 2 (two) times daily as needed. [provider]  Active               Assessment/Plan:   Comprehensive medication review performed; medication list updated in electronic medical record - Counsel patient on alternative dosage forms of potassium chloride, available if she ever has difficulty with swallowing these potassium tablets. Patient verbalizes understanding and states that she will let provider or pharmacist know if needed in the future - Identify patient has not yet tried Trelegy inhaler as recommended by PCP on 8/12 related to bronchitis  Note patient currently in coverage gap of her Medicare prescription coverage  Based on reported  income, patient does not meet criteria for Trelegy patient assistance.   Patient to follow up with PCP to determine if sample of Trelegy inhaler is available for patient to trial  Renal dose adjustment (based on CrCl ~30 ml/min): - Recommend patient decrease her levocetirizine 5 mg to  tablet (2.5 mg) every other day  Diabetes: - Currently controlled - Collaborating with provider, CPhT to aid patient with applying for Ozempic patient assistance program through [employer] patient's questions regarding this application today  Patient states that she will return this application to CPhT tomorrow   Heart Failure: - Reviewed to weigh daily and when to contact cardiology with weight gain - Reviewed dietary modifications including importance of following fluid limits from Cardiologist - Recommend to monitor home blood pressure, keep log of results and have this record to review at upcoming medical appointments. Patient to contact provider office sooner if needed for readings outside of established parameters or symptoms - Based on reported income, patient does not meet criteria for Jardiance patient assistance from [facility] or for therapeutic alternative manufacturer program - Patient would meet income limit for either [employer] or [employer] Heart Failure grant programs, but note that both grant funds are currently closed Again encourage patient to sign up for [employer] grant waitlist and [employer] grant alert             Resend patient MyChart message with this information as requested      Follow Up Plan: Clinical Pharmacist will follow up with patient by telephone on 09/26/2023 at 12 pm   Estelle Grumbles, PharmD, [facility] [phone]

## 2023-08-29 NOTE — Telephone Encounter (Signed)
Requested Prescriptions   Signed Prescriptions Disp Refills   potassium chloride SA (KLOR-CON M) 20 MEQ tablet 360 tablet 0    Sig: Take 2 tablets (40 mEq total) by mouth 2 (two) times daily.    Authorizing Provider: Debbe Odea    Ordering User: Guerry Minors

## 2023-09-01 ENCOUNTER — Ambulatory Visit: Payer: Medicare Other | Admitting: Family Medicine

## 2023-09-01 ENCOUNTER — Telehealth: Payer: Self-pay | Admitting: Pharmacy Technician

## 2023-09-01 ENCOUNTER — Encounter: Payer: Self-pay | Admitting: Family Medicine

## 2023-09-01 VITALS — BP 116/68 | HR 82 | Temp 97.5°F | Ht 61.0 in | Wt 230.4 lb

## 2023-09-01 DIAGNOSIS — Z23 Encounter for immunization: Secondary | ICD-10-CM | POA: Diagnosis not present

## 2023-09-01 DIAGNOSIS — M4003 Postural kyphosis, cervicothoracic region: Secondary | ICD-10-CM

## 2023-09-01 DIAGNOSIS — N1832 Chronic kidney disease, stage 3b: Secondary | ICD-10-CM | POA: Diagnosis not present

## 2023-09-01 DIAGNOSIS — E1122 Type 2 diabetes mellitus with diabetic chronic kidney disease: Secondary | ICD-10-CM | POA: Diagnosis not present

## 2023-09-01 DIAGNOSIS — R110 Nausea: Secondary | ICD-10-CM | POA: Diagnosis not present

## 2023-09-01 DIAGNOSIS — Z5986 Financial insecurity: Secondary | ICD-10-CM

## 2023-09-01 MED ORDER — ONDANSETRON HCL 4 MG PO TABS: 4.0000 mg | ORAL_TABLET | Freq: Three times a day (TID) | ORAL | 0 refills | Status: DC | PRN

## 2023-09-01 NOTE — Progress Notes (Signed)
Acute Office Visit  Subjective:     Patient ID: Gloria Mercer, female    DOB: [birth year], 82 y.o.   MRN: [id number]  Chief Complaint  Patient presents with   Back Pain    Can't straighten back and keep it straight    GI Problem    Nausea and going to the bathroom a lot.  Diarrhea some days    Back Pain  GI Problem  The illness is also significant for back pain.   Discussed the use of AI scribe software for clinical note transcription with the patient, who gave verbal consent to proceed.  History of Present Illness   The patient, with a history of back pain, reports a change in the nature of the pain. Previously characterized by intensity, the pain has now evolved into a postural issue. The patient struggles to maintain an upright posture, especially when standing for activities such as cooking or showering. This change has caused significant distress and fear. The patient can straighten up but finds it challenging to maintain this position. The pain is not as intense as before but is still present, occasionally noticeable even when sitting.  In addition to the back pain, the patient has been experiencing gastrointestinal issues. For about two weeks, the patient woke up feeling nauseous, a sensation that persisted throughout the day. Although the morning nausea has subsided, a general feeling of nausea remains. The patient has not vomited but reports frequent bowel movements, sometimes resembling diarrhea. On some days, the patient experiences early satiety, feeling full after only a few bites of food. The patient also mentions difficulty swallowing large pills, particularly potassium supplements.       Review of Systems  Musculoskeletal:  Positive for back pain.        Objective:    BP 116/68 (BP Location: Left Arm, Patient Position: Sitting, Cuff Size: Normal)   Pulse 82   Temp (!) 97.5 F (36.4 C) (Oral)   Ht 5\' 1"  (1.549 m)   Wt 230 lb 6.4 oz (104.5 kg)   SpO2 98%    BMI 43.53 kg/m    Physical Exam Vitals reviewed.  Constitutional:      General: She is not in acute distress.    Appearance: Normal appearance. She is well-developed. She is not diaphoretic.  HENT:     Head: Normocephalic and atraumatic.  Eyes:     General: No scleral icterus.    Conjunctiva/sclera: Conjunctivae normal.  Neck:     Thyroid: No thyromegaly.  Cardiovascular:     Rate and Rhythm: Normal rate and regular rhythm.     Heart sounds: Normal heart sounds. No murmur heard. Pulmonary:     Effort: Pulmonary effort is normal. No respiratory distress.     Breath sounds: Normal breath sounds. No wheezing, rhonchi or rales.  Abdominal:     General: There is no distension.     Palpations: Abdomen is soft.     Tenderness: There is abdominal tenderness (epigastric).  Musculoskeletal:     Cervical back: Neck supple.     Right lower leg: No edema.     Left lower leg: No edema.     Comments: +kyphosis  Lymphadenopathy:     Cervical: No cervical adenopathy.  Skin:    General: Skin is warm and dry.     Findings: No rash.  Neurological:     Mental Status: She is alert and oriented to person, place, and time. Mental status is at baseline.  Psychiatric:  Mood and Affect: Mood normal.        Behavior: Behavior normal.     No results found for any visits on 09/01/23.      Assessment & Plan:   Problem List Items Addressed This Visit       Endocrine   T2DM (type 2 diabetes mellitus) (HCC)   Relevant Orders   Amb ref to Medical Nutrition Therapy-MNT     Genitourinary   Stage 3b chronic kidney disease (HCC)   Relevant Orders   Amb ref to Medical Nutrition Therapy-MNT   Other Visit Diagnoses     Postural kyphosis of cervicothoracic region    -  Primary   Relevant Orders   Ambulatory referral to Home Health   Nausea       Relevant Orders   Amb ref to Medical Nutrition Therapy-MNT   Flu vaccine need       Relevant Orders   Flu Vaccine Trivalent High Dose  ([facility]) (Completed)           Back Pain/kyphosis Difficulty maintaining upright posture, with pain on standing and sitting. No longer taking Pregabalin due to side effects. -Refer to home health for physical therapy to improve posture and manage pain.  Gastrointestinal Symptoms Persistent nausea, early satiety, difficulty swallowing pills, and intermittent diarrhea. No vomiting, fever, or blood in stools. Epigastric pain present. History of reflux and currently on Nexium. -Refer to nutritionist to optimize diet and potentially alleviate symptoms - discussed smaller, more frequent meals, incorporating protein. -Prescribe Zofran as needed for nausea. -Consider referral to Gastroenterology for further evaluation if symptoms persist or worsen - patient declines today.  Diabetes and Kidney Disease Noted for nutrition referral. -Continue current management and monitor during home health visits.  General Health Maintenance -Flu vaccination administered. -Scheduled follow-up appointment next month.         Meds ordered this encounter  Medications   ondansetron (ZOFRAN) 4 MG tablet    Sig: Take 1 tablet (4 mg total) by mouth every 8 (eight) hours as needed for nausea or vomiting.    Dispense:  20 tablet    Refill:  0    Return for as scheduled.  [name], MD

## 2023-09-01 NOTE — Progress Notes (Signed)
[facility])                                            [facility] Quality Pharmacy Team    09/01/2023  Gloria Mercer July 31, 1941 [id number]  Received both patient and provider portion(s) of patient assistance application(s) for Ozempic. Faxed completed application and required documents into [employer].    Pattricia Boss, CPhT [place]  Office: [phone] Fax: [phone] Email: [name]

## 2023-09-03 ENCOUNTER — Other Ambulatory Visit: Payer: Self-pay | Admitting: Internal Medicine

## 2023-09-07 DIAGNOSIS — R10816 Epigastric abdominal tenderness: Secondary | ICD-10-CM | POA: Diagnosis not present

## 2023-09-07 DIAGNOSIS — G894 Chronic pain syndrome: Secondary | ICD-10-CM | POA: Diagnosis not present

## 2023-09-07 DIAGNOSIS — D631 Anemia in chronic kidney disease: Secondary | ICD-10-CM | POA: Diagnosis not present

## 2023-09-07 DIAGNOSIS — M1711 Unilateral primary osteoarthritis, right knee: Secondary | ICD-10-CM | POA: Diagnosis not present

## 2023-09-07 DIAGNOSIS — R7303 Prediabetes: Secondary | ICD-10-CM | POA: Diagnosis not present

## 2023-09-07 DIAGNOSIS — K219 Gastro-esophageal reflux disease without esophagitis: Secondary | ICD-10-CM | POA: Diagnosis not present

## 2023-09-07 DIAGNOSIS — G2581 Restless legs syndrome: Secondary | ICD-10-CM | POA: Diagnosis not present

## 2023-09-07 DIAGNOSIS — G4730 Sleep apnea, unspecified: Secondary | ICD-10-CM | POA: Diagnosis not present

## 2023-09-07 DIAGNOSIS — N1832 Chronic kidney disease, stage 3b: Secondary | ICD-10-CM | POA: Diagnosis not present

## 2023-09-07 DIAGNOSIS — R110 Nausea: Secondary | ICD-10-CM | POA: Diagnosis not present

## 2023-09-07 DIAGNOSIS — I130 Hypertensive heart and chronic kidney disease with heart failure and stage 1 through stage 4 chronic kidney disease, or unspecified chronic kidney disease: Secondary | ICD-10-CM | POA: Diagnosis not present

## 2023-09-07 DIAGNOSIS — E1122 Type 2 diabetes mellitus with diabetic chronic kidney disease: Secondary | ICD-10-CM | POA: Diagnosis not present

## 2023-09-07 DIAGNOSIS — M4003 Postural kyphosis, cervicothoracic region: Secondary | ICD-10-CM | POA: Diagnosis not present

## 2023-09-07 DIAGNOSIS — M549 Dorsalgia, unspecified: Secondary | ICD-10-CM | POA: Diagnosis not present

## 2023-09-07 DIAGNOSIS — I509 Heart failure, unspecified: Secondary | ICD-10-CM | POA: Diagnosis not present

## 2023-09-07 DIAGNOSIS — Z87891 Personal history of nicotine dependence: Secondary | ICD-10-CM | POA: Diagnosis not present

## 2023-09-07 DIAGNOSIS — M791 Myalgia, unspecified site: Secondary | ICD-10-CM | POA: Diagnosis not present

## 2023-09-07 DIAGNOSIS — Z6841 Body Mass Index (BMI) 40.0 and over, adult: Secondary | ICD-10-CM | POA: Diagnosis not present

## 2023-09-07 DIAGNOSIS — S32000D Wedge compression fracture of unspecified lumbar vertebra, subsequent encounter for fracture with routine healing: Secondary | ICD-10-CM | POA: Diagnosis not present

## 2023-09-08 ENCOUNTER — Other Ambulatory Visit
Admission: RE | Admit: 2023-09-08 | Discharge: 2023-09-08 | Disposition: A | Discharge status: Home / Self Care | Payer: Medicare Other | Source: Ambulatory Visit | Attending: Internal Medicine | Admitting: Internal Medicine

## 2023-09-08 ENCOUNTER — Ambulatory Visit: Payer: Medicare Other | Admitting: Internal Medicine

## 2023-09-08 VITALS — BP 96/29 | HR 83 | Wt 233.0 lb

## 2023-09-08 DIAGNOSIS — I10 Essential (primary) hypertension: Secondary | ICD-10-CM | POA: Diagnosis not present

## 2023-09-08 DIAGNOSIS — N183 Chronic kidney disease, stage 3 unspecified: Secondary | ICD-10-CM

## 2023-09-08 DIAGNOSIS — I5032 Chronic diastolic (congestive) heart failure: Secondary | ICD-10-CM | POA: Insufficient documentation

## 2023-09-08 LAB — BASIC METABOLIC PANEL
Anion gap: 18 — ABNORMAL HIGH (ref 5–15)
BUN: 42 mg/dL — ABNORMAL HIGH (ref 8–23)
CO2: 28 mmol/L (ref 22–32)
Calcium: 9.7 mg/dL (ref 8.9–10.3)
Chloride: 87 mmol/L — ABNORMAL LOW (ref 98–111)
Creatinine, Ser: 1.58 mg/dL — ABNORMAL HIGH (ref 0.44–1.00)
GFR, Estimated: 32 mL/min — ABNORMAL LOW (ref >60)
Glucose, Bld: 125 mg/dL — ABNORMAL HIGH (ref 70–99)
Potassium: 3.6 mmol/L (ref 3.5–5.1)
Sodium: 133 mmol/L — ABNORMAL LOW (ref 135–145)

## 2023-09-08 NOTE — Patient Instructions (Signed)
Medication Changes:  None, continue current medications  Lab Work:  Labs done today, your results will be available in MyChart, we will contact you for abnormal readings.  Special Instructions // Education:  Do the following things EVERYDAY: Weigh yourself in the morning before breakfast. Write it down and keep it in a log. Take your medicines as prescribed Eat low salt foods--Limit salt (sodium) to 2000 mg per day.  Stay as active as you can everyday Limit all fluids for the day to less than 2 liters   Follow-Up in: 6 months (April 2025), we will call you closer to this time to schedule    If you have any questions or concerns before your next appointment please send [place] a message through [place] or call our office at [phone] Monday-Friday 8 am-5 pm.   If you have an urgent need after hours on the weekend please call your Primary Cardiologist or the [facility] in [place] at [phone].   At the [facility], you and your health needs are our priority. We have a designated team specialized in the treatment of Heart Failure. This Care Team includes your primary Heart Failure Specialized Cardiologist (physician), Advanced Practice Providers (APPs- Physician Assistants and Nurse Practitioners), and Pharmacist who all work together to provide you with the care you need, when you need it.   You may see any of the following providers on your designated Care Team at your next follow up:  Dr. Arvilla Meres Dr. Marca Ancona Dr. Dorthula Nettles Dr. Theresia Bough Tonye Becket, NP Robbie Lis, [place] [address] [place], [place] Brynda Peon, NP [place] Lee, NP Clarisa Kindred, NP Enos Fling, PharmD

## 2023-09-08 NOTE — Progress Notes (Signed)
ADVANCED HF CLINIC CONSULT NOTE  Referring Physician: Erasmo Downer, MD Primary Care: Erasmo Downer, MD Primary Cardiologist: Debbe Odea, MD  HPI:  Gloria Mercer is a 82 y.o. female with a hx of HFpEF, hypertension, obesity, CKD 3b.   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 HST 6/22 AHI 13.5 with sat down to 81%   Echocardiogram 7/24 EF 60-65% G1DD RV ok Personally reviewed  Remains on metolazone 2.5 on Tues and Saturdays. Had been on torsemide 100/50 but Nephrology stopped the evening dose as SCr bumped from 1.41-> 1.67. At last visit we bumped back to 100/50 due to volume overload and prescribed Furoscix  Returns for f/u. Doing ok but limited by severe ortho issues with knee and hip pain. Now having HHPT/OT  Still drinking a lot of fluid. Have cut back fluid some. Dyspnea comes and goes. Sleeps in recliner. No PND.    Past Medical History:  Diagnosis Date   Allergic rhinitis    on allergy shots, sees Dr. Andee Poles   Anemia    Arthritis    B12 deficiency    Back pain    Bilateral edema of lower extremity    Cataract    CHF (congestive heart failure) (HCC)    Constipation    Edema    Gallbladder problem    GERD (gastroesophageal reflux disease)    History of stomach ulcers    Hyperlipidemia    Hypertension    Joint pain    Macular degeneration    follow[facility] by Dr Cherly Hensen [facility]   Osteoarthritis    Other fatigue    Prediabetes    Proteinuria    RLS (restless legs syndrome)    Sciatica    Shortness of breath    Shortness of breath on exertion    Sleep apnea    Stage 3 chronic kidney disease (HCC)    Swallowing difficulty    Vitamin D deficiency     Current Outpatient Medications  Medication Sig Dispense Refill   azelastine (ASTELIN) 0.1 % nasal spray USE 2 SPRAYS IN EACH NOSTRIL DAILY 30 mL 11   bisacodyl (DULCOLAX) 5 MG  EC tablet Take 5 mg by mouth daily as needed for moderate constipation.     Cholecalciferol (VITAMIN D3) 5000 units CAPS Take 1 capsule by mouth daily.     clotrimazole-betamethasone (LOTRISONE) cream APPLY EXTERNALLY TO THE AFFECTED AREA TWICE DAILY AS NEEDED 60 g 0   esomeprazole (NEXIUM) 20 MG capsule Take 40 mg by mouth daily at 12 noon.      ezetimibe (ZETIA) 10 MG tablet TAKE 1 TABLET(10 MG) BY MOUTH DAILY 90 tablet 2   Furosemide (FUROSCIX) 80 MG/10ML CTKT Inject 1 Dose into the skin daily as needed (for swelling). Call the Heart Failure Clinic for further instruction before taking this medicine     ipratropium (ATROVENT) 0.03 % nasal spray USE 2 SPRAYS IN EACH NOSTRIL EVERY 8 HOURS AS NEEDED 90 mL 1   JARDIANCE 10 MG TABS tablet TAKE 1 TABLET(10 MG) BY MOUTH DAILY BEFORE BREAKFAST 30 tablet 6   levocetirizine (XYZAL) 5 MG tablet Take 2.5 mg by mouth every other day as needed for allergies.     metolazone (ZAROXOLYN) 2.5 MG tablet Take 2.5 mg by mouth. Tuesday and Saturday     montelukast (SINGULAIR) 10 MG tablet TAKE 1 TABLET(10 MG)  BY MOUTH DAILY 90 tablet 2   Multiple Vitamins-Minerals (EYE VITAMINS PO) Take 2 tablets by mouth daily. Ocuvite 50+     ondansetron (ZOFRAN) 4 MG tablet Take 1 tablet (4 mg total) by mouth every 8 (eight) hours as needed for nausea or vomiting. 20 tablet 0   polyethylene glycol (MIRALAX / GLYCOLAX) 17 g packet Take 17 g by mouth daily as needed.     potassium chloride SA (KLOR-CON M) 20 MEQ tablet Take 2 tablets (40 mEq total) by mouth 2 (two) times daily. 360 tablet 0   pramipexole (MIRAPEX) 0.75 MG tablet TAKE 1 TABLET(0.75 MG) BY MOUTH AT BEDTIME 90 tablet 1   pregabalin (LYRICA) 25 MG capsule Take 1 capsule (25 mg total) by mouth 2 (two) times daily. 60 capsule 1   spironolactone (ALDACTONE) 25 MG tablet Take 1 tablet (25 mg total) by mouth daily. 90 tablet 3   torsemide (DEMADEX) 100 MG tablet Take 1.5 tablets (150 mg total) by mouth daily. (Patient taking  differently: Take 100 mg by mouth daily. 1 tablet (100 mg) each morning and 1/2 tablet (50 mg) at bedtime) 135 tablet 3   triamcinolone (NASACORT) 55 MCG/ACT AERO nasal inhaler Place 2 sprays into the nose daily.     triamcinolone ointment (KENALOG) 0.5 % Apply 1 Application topically 2 (two) times daily as needed.     Fluticasone-Umeclidin-Vilant (TRELEGY ELLIPTA) 100-62.5-25 MCG/ACT AEPB Inhale 1 puff into the lungs daily. (Patient not taking: Reported on 08/29/2023) 1 each 11   No current facility-administered medications for this visit.    Allergies  Allergen Reactions   Erythromycin Other (See Comments)    Other reaction(s): Headache Headache and GI upset Headache and GI upset   Losartan Nausea And Vomiting   Metoprolol Nausea And Vomiting   Simvastatin Other (See Comments)    Other reaction(s): Muscle Pain Pain lower extremities Pain lower extremities   Dexlansoprazole Diarrhea   Lisinopril Cough   Prednisone     Other reaction(s): Constipation GI upset      Social History   Socioeconomic History   Marital status: Divorced    Spouse name: Not on file   Number of children: 1   Years of education: Not on file   Highest education level: Bachelor's degree (e.g., BA, AB, BS)  Occupational History   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, theatre/television/film  Tobacco Use   Smoking status: Former    Current packs/day: 0.00    Average packs/day: 1 pack/day for 11.0 years (11.0 ttl pk-yrs)    Types: Cigarettes    Start date: 11/29/1965    Quit date: 11/28/1976    Years since quitting: 46.8   Smokeless tobacco: Never  Vaping Use   Vaping status: Never Used  Substance and Sexual Activity   Alcohol use: Not Currently    Comment: rarely, at holidays   Drug use: No   Sexual activity: Not Currently  Other Topics Concern   Not on file  Social History Narrative   Not on file   Social Determinants of Health   Financial Resource Strain: Low Risk  (12/27/2022)    Overall Financial Resource Strain (CARDIA)    Difficulty of Paying Living Expenses: Not hard at all  Food Insecurity: No Food Insecurity (12/21/2022)   Hunger Vital Sign    Worried About Running Out of Food in the Last Year: Never true    Ran Out of Food in the Last Year: Never true  Transportation Needs:  No Transportation Needs (12/27/2022)   PRAPARE - Administrator, Civil Service (Medical): No    Lack of Transportation (Non-Medical): No  Physical Activity: Inactive (02/24/2022)   Exercise Vital Sign    Days of Exercise per Week: 0 days    Minutes of Exercise per Session: 0 min  Stress: No Stress Concern Present (02/24/2022)   [employer] of Occupational Health - Occupational Stress Questionnaire    Feeling of Stress : Not at all  Social Connections: Unknown (02/24/2022)   Social Connection and Isolation Panel [NHANES]    Frequency of Communication with Friends and Family: More than three times a week    Frequency of Social Gatherings with Friends and Family: Once a week    Attends Religious Services: Never    Database administrator or Organizations: No    Attends Banker Meetings: Never    Marital Status: Not on file  Intimate Partner Violence: Not At Risk (02/24/2022)   Humiliation, Afraid, Rape, and Kick questionnaire    Fear of Current or Ex-Partner: No    Emotionally Abused: No    Physically Abused: No    Sexually Abused: No      Family History  Problem Relation Age of Onset   Hyperlipidemia Mother    Hypertension Mother    Seizures Mother    Hypertension Father    Heart disease Father 87       several heart attacks   Diabetes Father        diet controlled   Myelodysplastic syndrome Sister    Prostate cancer Paternal Grandfather    Breast cancer Neg Hx    Cervical cancer Neg Hx     Vitals:   09/08/23 1424  BP: (!) 96/29  Pulse: 83  SpO2: 96%  Weight: 233 lb (105.7 kg)     Wt Readings from Last 3 Encounters:  09/08/23 233 lb  (105.7 kg)  09/01/23 230 lb 6.4 oz (104.5 kg)  07/11/23 235 lb 4.8 oz (106.7 kg)    PHYSICAL EXAM: General:  Obese woman sitting in chair  No resp difficulty HEENT: normal Neck: supple. no JVD. Carotids 2+ bilat; no bruits. No lymphadenopathy or thryomegaly appreciated. Cor: PMI nondisplaced. Regular rate & rhythm. 2/6 TR Lungs: clear but decreased Abdomen: obese soft, nontender, nondistended. No hepatosplenomegaly. No bruits or masses. Good bowel sounds. Extremities: no cyanosis, clubbing, rash, 2+ ankle edema. No pretibial edema Neuro: alert & orientedx3, cranial nerves grossly intact. moves all 4 extremities w/o difficulty. Affect pleasant   ASSESSMENT & PLAN:  1. Chronic diastolic HF/lower extremity edema - suspect combination of diastolic HF and venous insufficiency - Echocardiogram 03/18/2021 EF 55-60% G1DD RV ok \ - Echocardiogram 7/24 EF 60-65% G1DD RV ok Personally reviewed - Stable NYHA III limited by weight and ortho issues  - Volume status improved still with some ankle edema but no pretibial edema.  - Continue torsemide 100/50 - encouraged her to use Furoscix as needed - Continue spiro 25 daily - Continue metolazone 2.5 Tues/Sat - Continue Jardiance 10  - Unable to use compression stockings -> failed using ACE wraps  2. OSA - Mild to moderate AHI 13.5 - refuses CPAP  3. Morbid obesity - her weight is really impacting QoL - I have asked her to discuss GLP1RA with her PCP  4. CKD  3b - follows with Dr. Thedore [facility]ins at [facility] - Continue Jardiance  - Management of diuretics as above - Check labs today  5. DM2 -  Consider ZOX0RU per PCP   Arvilla Meres, MD  2:30 PM

## 2023-09-09 ENCOUNTER — Telehealth: Payer: Self-pay

## 2023-09-09 ENCOUNTER — Telehealth (HOSPITAL_COMMUNITY): Payer: Self-pay | Admitting: Licensed Clinical Social Worker

## 2023-09-09 NOTE — Telephone Encounter (Signed)
H&V Care Navigation CSW Progress Note  Clinical Social Worker consulted to assist with transportation concerns.  CSW called pt who reports she normally drives herself to appts or has friends help but as she has aged and become more impaired she does not feel safe doing that and her friends are also aging and aren't safe to assist her anymore.  CSW called [facility] who confirmed they can take her if she completes application.  They also suggested Link transit so CSW mailed patient the application.  Patient will complete and turn in.  Encouraged her to reach out with any questions or urgent transport needs prior to being established with these resources.   SDOH Screenings   Food Insecurity: No Food Insecurity (12/21/2022)  Housing: Low Risk  (02/24/2022)  Transportation Needs: No Transportation Needs (12/27/2022)  Alcohol Screen: Low Risk  (07/11/2023)  Depression (PHQ2-9): High Risk (07/11/2023)  Financial Resource Strain: Low Risk  (12/27/2022)  Physical Activity: Inactive (02/24/2022)  Social Connections: Unknown (02/24/2022)  Stress: No Stress Concern Present (02/24/2022)  Tobacco Use: Medium Risk (09/01/2023)   Burna Sis, LCSW Clinical Social Worker [facility] Desk#: [phone] Cell#: [phone]

## 2023-09-09 NOTE — Telephone Encounter (Signed)
Copied from CRM [id number]. Topic: General - Other >> Sep 09, 2023  8:14 AM Phill Myron wrote: Level 2 medication interaction potassium and spironolactone   Home Health Verbal Orders - Caller/Agency: Toney Reil with [facility]lynda Jaeger Number: [phone] secure and you can leave a voice mail Requesting OT/PT/Skilled Nursing/Social Work/Speech Therapy: PT Frequency: 1w1, 2w2, 1w2, 1 every 2w4

## 2023-09-12 NOTE — Telephone Encounter (Signed)
Verbal orders given to [facility] ok per Dr. Leonard Schwartz

## 2023-09-12 NOTE — Telephone Encounter (Signed)
OK for verbals

## 2023-09-13 DIAGNOSIS — S32000D Wedge compression fracture of unspecified lumbar vertebra, subsequent encounter for fracture with routine healing: Secondary | ICD-10-CM | POA: Diagnosis not present

## 2023-09-13 DIAGNOSIS — M1711 Unilateral primary osteoarthritis, right knee: Secondary | ICD-10-CM | POA: Diagnosis not present

## 2023-09-13 DIAGNOSIS — M549 Dorsalgia, unspecified: Secondary | ICD-10-CM | POA: Diagnosis not present

## 2023-09-13 DIAGNOSIS — M4003 Postural kyphosis, cervicothoracic region: Secondary | ICD-10-CM | POA: Diagnosis not present

## 2023-09-13 DIAGNOSIS — I130 Hypertensive heart and chronic kidney disease with heart failure and stage 1 through stage 4 chronic kidney disease, or unspecified chronic kidney disease: Secondary | ICD-10-CM | POA: Diagnosis not present

## 2023-09-13 DIAGNOSIS — G894 Chronic pain syndrome: Secondary | ICD-10-CM | POA: Diagnosis not present

## 2023-09-19 ENCOUNTER — Other Ambulatory Visit: Payer: Self-pay | Admitting: Pharmacist

## 2023-09-19 ENCOUNTER — Telehealth: Payer: Self-pay | Admitting: Pharmacy Technician

## 2023-09-19 DIAGNOSIS — Z5986 Financial insecurity: Secondary | ICD-10-CM

## 2023-09-19 NOTE — Progress Notes (Signed)
09/19/2023  Patient ID: Gloria Mercer, female   DOB: 23-Apr-1941, 82 y.o.   MRN: [id number]  Receive a message from CPhT Noreene Larsson [place] who advises she received a response back from [employer] that patient's application for Ozempic patient assistance was denied based on income.  Follow up with patient to provide this update.   Patient states that she will discuss with her providers trying to start the Ozempic in the next calendar year, once she is out of the coverage gap of her Medicare prescription coverage.  Again discuss strategies to reduce medication cost burden. Patient would meet income limit for either PAN Foundation or Healthwell Foundation Heart Failure grant programs, but both grant funds are currently closed Again encourage patient to sign up for [employer] grant waitlist and [employer] grant alert              Patient denies further medication questions or concerns today Provide patient with contact information for clinic pharmacist to contact if needed in future for medication questions/concerns   Estelle Grumbles, PharmD, [facility] [phone]

## 2023-09-19 NOTE — Progress Notes (Signed)
[facility] Customer service manager [facility])                                            [facility] Quality Pharmacy Team    09/19/2023  Gloria Mercer 08/30/1941 [id number]  Care coordination call placed to Novo Nordisk in regard to Ozempic application.  Spoke to [place] who informs patient is DENIED as her income is over 400% [employer].  In basket message sent to [facility] PharmD Estelle Grumbles to notify patient of her denial and to discuss next steps.  Pattricia Boss, CPhT Gloria Mercer  Office: [phone] Fax: [phone] Email: [name]

## 2023-09-20 DIAGNOSIS — M4003 Postural kyphosis, cervicothoracic region: Secondary | ICD-10-CM | POA: Diagnosis not present

## 2023-09-20 DIAGNOSIS — S32000D Wedge compression fracture of unspecified lumbar vertebra, subsequent encounter for fracture with routine healing: Secondary | ICD-10-CM | POA: Diagnosis not present

## 2023-09-20 DIAGNOSIS — G894 Chronic pain syndrome: Secondary | ICD-10-CM | POA: Diagnosis not present

## 2023-09-20 DIAGNOSIS — I130 Hypertensive heart and chronic kidney disease with heart failure and stage 1 through stage 4 chronic kidney disease, or unspecified chronic kidney disease: Secondary | ICD-10-CM | POA: Diagnosis not present

## 2023-09-20 DIAGNOSIS — M549 Dorsalgia, unspecified: Secondary | ICD-10-CM | POA: Diagnosis not present

## 2023-09-20 DIAGNOSIS — M1711 Unilateral primary osteoarthritis, right knee: Secondary | ICD-10-CM | POA: Diagnosis not present

## 2023-09-23 ENCOUNTER — Telehealth: Payer: Self-pay | Admitting: Family Medicine

## 2023-09-23 DIAGNOSIS — Z5982 Transportation insecurity: Secondary | ICD-10-CM

## 2023-09-23 NOTE — Telephone Encounter (Signed)
Misty Stanley calling from [facility] is calling to see if Dr. B would be ok adding Social Work order. Pt is having transportation concerns. Please advise CB- [phone] Can leave VM

## 2023-09-23 NOTE — Telephone Encounter (Signed)
I try to pull CCM but was not able. Not sure if the one I pended is the correct one.

## 2023-09-25 NOTE — Patient Instructions (Signed)
Goals Addressed             This Visit's Progress    Pharmacy Goals       [employer] Programs:   As we discussed, these grants are not currently open.   However, please visit the PAN Foundation website to complete enrollment for the Heart Failure Grant waitlist at:   [email or web address]   To apply by phone, you can call [phone] Monday through Friday, 9 a.m. to 5:30 p.m. ET.   Also, the following is the link for the HealthWell Foundation Chronic Heart Failure Fund. This fund is currently closed, but there is an option through the link below to sign up for their "Fund Alerts" to be notified when it is open again.   [email or web address]/fund/[email or web address]-[email or web address]   [employer] Phone number: [phone]   Thank you!    Estelle Grumbles, PharmD, [facility] [phone]

## 2023-09-26 ENCOUNTER — Other Ambulatory Visit: Payer: Medicare Other | Admitting: Pharmacist

## 2023-09-26 ENCOUNTER — Telehealth: Payer: Self-pay | Admitting: *Deleted

## 2023-09-26 NOTE — Progress Notes (Signed)
Care Coordination  Outreach Note  09/26/2023 Name: Gloria Mercer MRN: [id number] DOB: [birth year]   Care Coordination Outreach Attempts: An unsuccessful telephone outreach was attempted today to offer the patient information about available care coordination services.  Follow Up Plan:  Additional outreach attempts will be made to offer the patient care coordination information and services.   Encounter Outcome:  No Answer  Burman Nieves, CCMA Care Coordination Care Guide Direct Dial: [phone]

## 2023-09-26 NOTE — Progress Notes (Signed)
Care Coordination   Note   09/26/2023 Name: Gloria Mercer MRN: [id number] DOB: 06/18/1941  Gloria Mercer is a 82 y.o. year old female who sees [name], Marzella Schlein, MD for primary care. I reached out to Gloria Mercer by phone today to offer care coordination services.  [name] was given information about Care Coordination services today including:   The Care Coordination services include support from the care team which includes your Nurse Coordinator, Clinical Social Worker, or Pharmacist.  The Care Coordination team is here to help remove barriers to the health concerns and goals most important to you. Care Coordination services are voluntary, and the patient may decline or stop services at any time by request to their care team member.   Care Coordination Consent Status: Patient did not agree to participate in care coordination services at this time.  Follow up plan:  pt appreciative but says she has received resources for transportation and declines further social work needs at this time   Encounter Outcome:  Patient Refused  Burman Nieves, [facility] Care Coordination Care Guide Direct Dial: [phone]

## 2023-09-26 NOTE — Telephone Encounter (Signed)
Referral sent to [facility] for transportation needs.

## 2023-09-27 ENCOUNTER — Other Ambulatory Visit: Payer: Self-pay | Admitting: Family Medicine

## 2023-09-27 NOTE — Telephone Encounter (Unsigned)
Copied from CRM [id number]. Topic: General - Other >> Sep 27, 2023  3:26 PM Everette C wrote: Reason for CRM: Medication Refill - Medication: ondansetron (ZOFRAN) 4 MG tablet [[id number]]  Has the patient contacted their pharmacy? Yes.   (Agent: If no, request that the patient contact the pharmacy for the refill. If patient does not wish to contact the pharmacy document the reason why and proceed with request.) (Agent: If yes, when and what did the pharmacy advise?)  Preferred Pharmacy (with phone number or street name): [id number] [place], [place] - [address] AT [facility] OF [address] [place] [address] Phone: [phone] Fax: [phone] Hours: Not open 24 hours   Has the patient been seen for an appointment in the last year OR does the patient have an upcoming appointment? Yes.    Agent: Please be advised that RX refills may take up to 3 business days. We ask that you follow-up with your pharmacy.

## 2023-09-28 NOTE — Telephone Encounter (Signed)
Requested medication (s) are due for refill today: Yes  Requested medication (s) are on the active medication list: yes    Last refill: 09/01/23  #20  0 refills  Future visit scheduled yes 10/01/23  Notes to clinic:Not delegated, please review. Thank you.  Requested Prescriptions  Pending Prescriptions Disp Refills   ondansetron (ZOFRAN) 4 MG tablet 20 tablet 0    Sig: Take 1 tablet (4 mg total) by mouth every 8 (eight) hours as needed for nausea or vomiting.     Not Delegated - Gastroenterology: Antiemetics - ondansetron Failed - 09/27/2023  3:58 PM      Failed - This refill cannot be delegated      Passed - AST in normal range and within 360 days    AST  Date Value Ref Range Status  07/01/2023 11 0 - 40 IU/L Final         Passed - ALT in normal range and within 360 days    ALT  Date Value Ref Range Status  07/01/2023 14 0 - 32 IU/L Final         Passed - Valid encounter within last 6 months    Recent Outpatient Visits           3 weeks ago Postural kyphosis of cervicothoracic region   [facility] [place], Marzella Schlein, MD   2 months ago Encounter for annual wellness visit (AWV) in Medicare patient   [facility] [place], Marzella Schlein, MD   4 months ago Chronic pain syndrome   [facility] [place], Marzella Schlein, MD   5 months ago Spinal stenosis of lumbar region without neurogenic claudication   [facility] [place], Marzella Schlein, MD   9 months ago Essential hypertension    [facility] [place], Marzella Schlein, MD       Future Appointments             In 1 week Agbor-Etang, Arlys John, MD [facility] [place]   In 1 week [name], Marzella Schlein, MD [facility]

## 2023-09-29 MED ORDER — ONDANSETRON HCL 4 MG PO TABS: 4.0000 mg | ORAL_TABLET | Freq: Three times a day (TID) | ORAL | 0 refills | Status: AC | PRN

## 2023-09-30 ENCOUNTER — Telehealth: Payer: Self-pay | Admitting: Family Medicine

## 2023-09-30 NOTE — Telephone Encounter (Signed)
Gloria Mercer calling from [facility] is calling to report a missed visit yesterday. Pt reporting not feeling well [phone]

## 2023-10-03 DIAGNOSIS — I509 Heart failure, unspecified: Secondary | ICD-10-CM | POA: Diagnosis not present

## 2023-10-03 DIAGNOSIS — G2581 Restless legs syndrome: Secondary | ICD-10-CM | POA: Diagnosis not present

## 2023-10-03 DIAGNOSIS — M791 Myalgia, unspecified site: Secondary | ICD-10-CM | POA: Diagnosis not present

## 2023-10-03 DIAGNOSIS — M1711 Unilateral primary osteoarthritis, right knee: Secondary | ICD-10-CM | POA: Diagnosis not present

## 2023-10-03 DIAGNOSIS — M4003 Postural kyphosis, cervicothoracic region: Secondary | ICD-10-CM | POA: Diagnosis not present

## 2023-10-03 DIAGNOSIS — M549 Dorsalgia, unspecified: Secondary | ICD-10-CM | POA: Diagnosis not present

## 2023-10-03 DIAGNOSIS — S32000D Wedge compression fracture of unspecified lumbar vertebra, subsequent encounter for fracture with routine healing: Secondary | ICD-10-CM | POA: Diagnosis not present

## 2023-10-03 DIAGNOSIS — D631 Anemia in chronic kidney disease: Secondary | ICD-10-CM | POA: Diagnosis not present

## 2023-10-03 DIAGNOSIS — N1832 Chronic kidney disease, stage 3b: Secondary | ICD-10-CM | POA: Diagnosis not present

## 2023-10-03 DIAGNOSIS — G894 Chronic pain syndrome: Secondary | ICD-10-CM | POA: Diagnosis not present

## 2023-10-03 DIAGNOSIS — E1122 Type 2 diabetes mellitus with diabetic chronic kidney disease: Secondary | ICD-10-CM | POA: Diagnosis not present

## 2023-10-03 DIAGNOSIS — I130 Hypertensive heart and chronic kidney disease with heart failure and stage 1 through stage 4 chronic kidney disease, or unspecified chronic kidney disease: Secondary | ICD-10-CM | POA: Diagnosis not present

## 2023-10-03 NOTE — Telephone Encounter (Signed)
Noted  

## 2023-10-05 ENCOUNTER — Ambulatory Visit: Payer: Medicare Other | Admitting: Cardiology

## 2023-10-06 DIAGNOSIS — M1711 Unilateral primary osteoarthritis, right knee: Secondary | ICD-10-CM | POA: Diagnosis not present

## 2023-10-06 DIAGNOSIS — G894 Chronic pain syndrome: Secondary | ICD-10-CM | POA: Diagnosis not present

## 2023-10-06 DIAGNOSIS — M4003 Postural kyphosis, cervicothoracic region: Secondary | ICD-10-CM | POA: Diagnosis not present

## 2023-10-06 DIAGNOSIS — I130 Hypertensive heart and chronic kidney disease with heart failure and stage 1 through stage 4 chronic kidney disease, or unspecified chronic kidney disease: Secondary | ICD-10-CM | POA: Diagnosis not present

## 2023-10-06 DIAGNOSIS — M549 Dorsalgia, unspecified: Secondary | ICD-10-CM | POA: Diagnosis not present

## 2023-10-06 DIAGNOSIS — S32000D Wedge compression fracture of unspecified lumbar vertebra, subsequent encounter for fracture with routine healing: Secondary | ICD-10-CM | POA: Diagnosis not present

## 2023-10-07 NOTE — Patient Instructions (Signed)
Recommend Covid and Tdap vaccines at pharmacy  Please provide [place] with name of eye care provider-eye exam is needed

## 2023-10-11 ENCOUNTER — Encounter: Payer: Self-pay | Admitting: Family Medicine

## 2023-10-11 ENCOUNTER — Ambulatory Visit: Payer: Medicare Other | Admitting: Family Medicine

## 2023-10-11 VITALS — BP 100/65 | HR 84 | Ht 61.0 in | Wt 231.8 lb

## 2023-10-11 DIAGNOSIS — N1832 Chronic kidney disease, stage 3b: Secondary | ICD-10-CM

## 2023-10-11 DIAGNOSIS — R110 Nausea: Secondary | ICD-10-CM | POA: Diagnosis not present

## 2023-10-11 DIAGNOSIS — J411 Mucopurulent chronic bronchitis: Secondary | ICD-10-CM

## 2023-10-11 DIAGNOSIS — I5032 Chronic diastolic (congestive) heart failure: Secondary | ICD-10-CM

## 2023-10-11 DIAGNOSIS — E785 Hyperlipidemia, unspecified: Secondary | ICD-10-CM | POA: Diagnosis not present

## 2023-10-11 DIAGNOSIS — E1159 Type 2 diabetes mellitus with other circulatory complications: Secondary | ICD-10-CM

## 2023-10-11 DIAGNOSIS — E1122 Type 2 diabetes mellitus with diabetic chronic kidney disease: Secondary | ICD-10-CM

## 2023-10-11 DIAGNOSIS — E1169 Type 2 diabetes mellitus with other specified complication: Secondary | ICD-10-CM | POA: Diagnosis not present

## 2023-10-11 DIAGNOSIS — I152 Hypertension secondary to endocrine disorders: Secondary | ICD-10-CM | POA: Diagnosis not present

## 2023-10-11 NOTE — Assessment & Plan Note (Signed)
Heart failure managed with metolazone, spironolactone, and torsemide. Advised to space out diuretics to avoid hypotension. No new symptoms. Emphasized managing fluid status and potential diuretic side effects. - Continue current diuretic regimen - Space out diuretics to avoid hypotension

## 2023-10-11 NOTE — Assessment & Plan Note (Signed)
Chronic Kidney Disease with dietary management No new issues with fish oil intake. Advised to monitor potassium and phosphorus content in dietary supplements.  - Monitor dietary supplements for potassium and phosphorus content

## 2023-10-11 NOTE — Assessment & Plan Note (Signed)
Well controlled. Continue current medications  

## 2023-10-11 NOTE — Assessment & Plan Note (Signed)
Type 2 Diabetes Mellitus with last A1c at 6.6. Difficulty managing thirst and sugar intake, primarily through drinks. Discussed alternatives to sugary drinks. Plan to recheck A1c in three months. Emphasized monitoring sugar intake and its impact on A1c levels. - Recheck A1c in three months - Discuss alternatives to sugary drinks, such as sugar-free flavoring options

## 2023-10-11 NOTE — Assessment & Plan Note (Signed)
COPD with difficulty obtaining Trelegy inhaler due to cost. Medication costs may decrease in January when out of the Medicare donut hole. Discussed impact of medication adherence on COPD management and potential financial assistance options. - Reassess medication affordability in January

## 2023-10-11 NOTE — Assessment & Plan Note (Signed)
LDL previously at 117, goal is below 70 due to diabetes. Patient has difficulty tolerating statins. -Continue Zetia. -Monitor cholesterol levels regularly.

## 2023-10-11 NOTE — Progress Notes (Signed)
Established Patient Office Visit  Subjective   Patient ID: Gloria Mercer, female    DOB: 31-Oct-1941  Age: 82 y.o. MRN: [id number]  Chief Complaint  Patient presents with   Medical Management of Chronic Issues    3 month follow-up. Patient reports lower leg edema and a terrible time with nausea.     HPI  Discussed the use of AI scribe software for clinical note transcription with the patient, who gave verbal consent to proceed.  History of Present Illness   The patient, with a history of diabetes and heart failure, presents with a chief complaint of persistent nausea. The nausea had been constant for three weeks, occurring from the time the patient woke up until they went to sleep. Recently, the nausea has become intermittent, occurring at certain times of the day. The patient denies any correlation with the start of a new medication. The patient also reports constipation, which is managed with daily Miralax. The patient does not believe the constipation is related to the nausea. The patient also reports difficulty with medication costs and has recently started taking fish oil again. The patient is on multiple medications including fluid pills for heart failure. The patient reports difficulty with medication costs and has recently started taking fish oil again.         ROS    Objective:     BP 100/65 (BP Location: Left Wrist, Patient Position: Sitting, Cuff Size: Normal)   Pulse 84   Ht 5\' 1"  (1.549 m)   Wt 231 lb 12.8 oz (105.1 kg)   SpO2 99%   BMI 43.80 kg/m    Physical Exam Vitals reviewed.  Constitutional:      General: She is not in acute distress.    Appearance: Normal appearance. She is well-developed. She is not diaphoretic.  HENT:     Head: Normocephalic and atraumatic.  Eyes:     General: No scleral icterus.    Conjunctiva/sclera: Conjunctivae normal.  Neck:     Thyroid: No thyromegaly.  Cardiovascular:     Rate and Rhythm: Normal rate and regular  rhythm.     Heart sounds: Normal heart sounds. No murmur heard. Pulmonary:     Effort: Pulmonary effort is normal. No respiratory distress.     Breath sounds: Normal breath sounds. No wheezing, rhonchi or rales.  Musculoskeletal:     Cervical back: Neck supple.     Right lower leg: Edema present.     Left lower leg: Edema present.  Lymphadenopathy:     Cervical: No cervical adenopathy.  Skin:    General: Skin is warm and dry.     Findings: No rash.  Neurological:     Mental Status: She is alert and oriented to person, place, and time. Mental status is at baseline.  Psychiatric:        Mood and Affect: Mood normal.        Behavior: Behavior normal.      No results found for any visits on 10/11/23.    The ASCVD Risk score (Arnett DK, et al., 2019) failed to calculate for the following reasons:   The 2019 ASCVD risk score is only valid for ages 24 to 63    Assessment & Plan:   Problem List Items Addressed This Visit       Cardiovascular and Mediastinum   Hypertension associated with diabetes (HCC) - Primary    Well controlled Continue current medications      Heart failure, unspecified (HCC)  Heart failure managed with metolazone, spironolactone, and torsemide. Advised to space out diuretics to avoid hypotension. No new symptoms. Emphasized managing fluid status and potential diuretic side effects. - Continue current diuretic regimen - Space out diuretics to avoid hypotension        Respiratory   Bronchitis, mucopurulent recurrent (HCC)    COPD with difficulty obtaining Trelegy inhaler due to cost. Medication costs may decrease in January when out of the Medicare donut hole. Discussed impact of medication adherence on COPD management and potential financial assistance options. - Reassess medication affordability in January        Endocrine   Hyperlipidemia associated with type 2 diabetes mellitus (HCC)    LDL previously at 117, goal is below 70 due to  diabetes. Patient has difficulty tolerating statins. -Continue Zetia. -Monitor cholesterol levels regularly.      T2DM (type 2 diabetes mellitus) (HCC)    Type 2 Diabetes Mellitus with last A1c at 6.6. Difficulty managing thirst and sugar intake, primarily through drinks. Discussed alternatives to sugary drinks. Plan to recheck A1c in three months. Emphasized monitoring sugar intake and its impact on A1c levels. - Recheck A1c in three months - Discuss alternatives to sugary drinks, such as sugar-free flavoring options        Genitourinary   CKD (chronic kidney disease) stage 3, GFR 30-59 ml/min (HCC)    Chronic Kidney Disease with dietary management No new issues with fish oil intake. Advised to monitor potassium and phosphorus content in dietary supplements.  - Monitor dietary supplements for potassium and phosphorus content      Other Visit Diagnoses     Nausea               Nausea Intermittent nausea for three weeks, initially persistent, now sporadic. No vomiting. No clear association with medications. Constipation managed with Miralax. Gastroparesis considered but unlikely. Advised to log activities and symptoms to identify triggers. Severe nausea may not respond to medication. - Log activities and symptoms - Continue Miralax for constipation  General Health Maintenance Routine follow-up for diabetes and other chronic conditions. Eye exam rescheduled to end of December. Advised to bring clinic card to eye appointment for report submission. - Bring clinic card to eye appointment for report submission  Follow-up - Schedule follow-up visit in three months - Perform routine labs at next visit.        Return in about 3 months (around 01/11/2024) for chronic disease f/u.    [name], MD

## 2023-10-12 ENCOUNTER — Ambulatory Visit: Payer: Medicare Other | Admitting: Dietician

## 2023-11-04 ENCOUNTER — Telehealth: Payer: Self-pay | Admitting: Family Medicine

## 2023-11-04 NOTE — Telephone Encounter (Signed)
Stacy with [facility] called saying she can not get in touch with the patient lately.    They can not service the patient if they can't make appt.  CB#  [phone]

## 2023-11-09 NOTE — Telephone Encounter (Signed)
Patient called, left VM to return the call to the office. 

## 2023-11-24 ENCOUNTER — Telehealth: Payer: Self-pay | Admitting: Cardiology

## 2023-11-24 ENCOUNTER — Other Ambulatory Visit: Payer: Self-pay

## 2023-11-24 MED ORDER — POTASSIUM CHLORIDE CRYS ER 20 MEQ PO TBCR: 40.0000 meq | EXTENDED_RELEASE_TABLET | Freq: Two times a day (BID) | ORAL | 0 refills | Status: DC

## 2023-11-24 NOTE — Telephone Encounter (Signed)
*  STAT* If patient is at the pharmacy, call can be transferred to refill team.   1. Which medications need to be refilled? (please list name of each medication and dose if known)  Potassium   2. Would you like to learn more about the convenience, safety, & potential cost savings by using the [facility]?     3. Are you open to using the [facility] (Type [facility]. .   4. Which pharmacy/location (including street and city if local pharmacy) is medication to be sent to?[facility] [address] and [place], [place]   5. Do they need a 30 day or 90 day supply? 90 days and refills- please call in today

## 2023-11-24 NOTE — Telephone Encounter (Signed)
Disp Refills Start End   potassium chloride SA (KLOR-CON M) 20 MEQ tablet 360 tablet 0 11/24/2023 --   Sig - Route: Take 2 tablets (40 mEq total) by mouth 2 (two) times daily. - Oral   Sent to pharmacy as: potassium chloride SA (KLOR-CON M) 20 MEQ tablet   E-Prescribing Status: Receipt confirmed by pharmacy (11/24/2023  1:15 PM EST)    Pharmacy  [id number] [place], [place] - [address] AT [address] OF [address]

## 2023-11-28 ENCOUNTER — Telehealth: Payer: Self-pay | Admitting: Family Medicine

## 2023-11-28 NOTE — Telephone Encounter (Signed)
LVMTCB. CRM created. Ok for [facility] to advise/clarify.

## 2023-11-28 NOTE — Telephone Encounter (Signed)
I don't know if patient is in a facility. I haven't seen anything in the chart about this.  Can you try to reach out to the patient and maybe her emergency contact to find out. May need to have a welfare check sent out to her if unable to locate/get ahold of patient or next of kin.

## 2023-11-28 NOTE — Telephone Encounter (Signed)
Marisue Ivan calling from [facility] is calling to report to Dr. B that she is unable to get into touch with the  Last visit 10/06/23 for PT.Heard that patient was in a facility. But unable to locate the patient.   Going to discharge CB- [phone]

## 2023-11-29 ENCOUNTER — Ambulatory Visit: Payer: Medicare Other | Attending: Cardiology | Admitting: Cardiology

## 2023-11-29 ENCOUNTER — Encounter: Payer: Self-pay | Admitting: Cardiology

## 2023-11-29 VITALS — BP 98/68 | HR 81 | Ht 61.0 in | Wt 232.4 lb

## 2023-11-29 DIAGNOSIS — I10 Essential (primary) hypertension: Secondary | ICD-10-CM | POA: Insufficient documentation

## 2023-11-29 DIAGNOSIS — I5032 Chronic diastolic (congestive) heart failure: Secondary | ICD-10-CM | POA: Insufficient documentation

## 2023-11-29 MED ORDER — SEMAGLUTIDE-WEIGHT MANAGEMENT 0.25 MG/0.5ML ~~LOC~~ SOAJ: 0.2500 mg | SUBCUTANEOUS | 0 refills | Status: AC

## 2023-11-29 MED ORDER — SEMAGLUTIDE-WEIGHT MANAGEMENT 0.5 MG/0.5ML ~~LOC~~ SOAJ: 0.5000 mg | SUBCUTANEOUS | 0 refills | Status: DC

## 2023-11-29 MED ORDER — SEMAGLUTIDE-WEIGHT MANAGEMENT 2.4 MG/0.75ML ~~LOC~~ SOAJ: 2.4000 mg | SUBCUTANEOUS | 1 refills | Status: DC

## 2023-11-29 MED ORDER — SEMAGLUTIDE-WEIGHT MANAGEMENT 1 MG/0.5ML ~~LOC~~ SOAJ: 1.0000 mg | SUBCUTANEOUS | 0 refills | Status: DC

## 2023-11-29 MED ORDER — SEMAGLUTIDE-WEIGHT MANAGEMENT 1.7 MG/0.75ML ~~LOC~~ SOAJ: 1.7000 mg | SUBCUTANEOUS | 0 refills | Status: DC

## 2023-11-29 NOTE — Telephone Encounter (Signed)
Left message for son to call back in regard to message.

## 2023-11-29 NOTE — Patient Instructions (Addendum)
 Medication Instructions:  Your physician recommends the following medication changes.  START TAKING: Wegovy  (semaglutide  injection) as per formulary once per week  *If you need a refill on your cardiac medications before your next appointment, please call your pharmacy*   Lab Work: Your provider would like for you to have following labs drawn today BMP.   If you have labs (blood work) drawn today and your tests are completely normal, you will receive your results only by: MyChart Message (if you have MyChart) OR A paper copy in the mail If you have any lab test that is abnormal or we need to change your treatment, we will call you to review the results.   Follow-Up: At [facility], you and your health needs are our priority.  As part of our continuing mission to provide you with exceptional heart care, we have created designated Provider Care Teams.  These Care Teams include your primary Cardiologist (physician) and Advanced Practice Providers (APPs -  Physician Assistants and Nurse Practitioners) who all work together to provide you with the care you need, when you need it.  We recommend signing up for the patient portal called MyChart.  Sign up information is provided on this After Visit Summary.  MyChart is used to connect with patients for Virtual Visits (Telemedicine).  Patients are able to view lab/test results, encounter notes, upcoming appointments, etc.  Non-urgent messages can be sent to your provider as well.   To learn more about what you can do with MyChart, go to [email or web address].    Your next appointment:   4-5 month(s)  Provider:   You may see Redell Cave, MD or one of the following Advanced Practice Providers on your designated Care Team:   Lonni Meager, NP Bernardino Bring, PA-C Cadence Franchester, PA-C Tylene Lunch, NP Barnie Hila, NP

## 2023-11-29 NOTE — Progress Notes (Signed)
 Cardiology Office Note:    Date:  11/29/2023   ID:  Gloria Mercer, DOB 1941/03/06, MRN [id number]  PCP:  Myrla Jon HERO, MD    [facility]  Cardiologist:  Redell Cave, MD  Advanced Practice Provider:  No care team member to display Electrophysiologist:  None       Referring MD: Myrla Jon HERO, MD   Chief Complaint  Patient presents with   Follow-up    Patient denies new or acute cardiac problems/concerns today.      History of Present Illness:    Gloria Mercer is a 82 y.o. female with a hx of HFpEF, hypertension, obesity, CKD presents for follow-up.   Being seen for HFpEF, leg edema and hypertension.  Torsemide  increased to 100 mg in a.m., 50 mg in p.m.  Hydralazine  was stopped due to low normal BP.  States having adequate urination with taking torsemide .  Also takes metolazone  2 times weekly.  She practically sits on the toilet urinating all day when she takes metolazone .  Previously evaluated by heart failure service, was prescribed Furoscix , has not used these for a while now.  Still endorses eating sweets although she has cut back some.   Prior notes Echocardiogram 03/18/2021 normal systolic function, impaired relaxation, EF 55 to 60%. Not tolerant to several BP meds including losartan, metoprolol, lisinopril. Intolerant to metolazone , developed significant constipation.   Past Medical History:  Diagnosis Date   Allergic rhinitis    on allergy shots, sees Dr. Milissa   Anemia    Arthritis    B12 deficiency    Back pain    Bilateral edema of lower extremity    Cataract    CHF (congestive heart failure) (HCC)    Constipation    Edema    Gallbladder problem    GERD (gastroesophageal reflux disease)    History of stomach ulcers    Hyperlipidemia    Hypertension    Joint pain    Macular degeneration    followed by Dr Rutherford at [facility]   Osteoarthritis    Other fatigue    Prediabetes    Proteinuria    RLS (restless  legs syndrome)    Sciatica    Shortness of breath    Shortness of breath on exertion    Sleep apnea    Stage 3 chronic kidney disease (HCC)    Swallowing difficulty    Vitamin D  deficiency     Past Surgical History:  Procedure Laterality Date   CHOLECYSTECTOMY  2002   TUBAL LIGATION  1978    Current Medications: Current Meds  Medication Sig   azelastine  (ASTELIN ) 0.1 % nasal spray USE 2 SPRAYS IN EACH NOSTRIL DAILY   bisacodyl  (DULCOLAX) 5 MG EC tablet Take 5 mg by mouth daily as needed for moderate constipation.   Cholecalciferol (VITAMIN D3) 5000 units CAPS Take 1 capsule by mouth daily.   clotrimazole -betamethasone  (LOTRISONE ) cream APPLY EXTERNALLY TO THE AFFECTED AREA TWICE DAILY AS NEEDED   esomeprazole (NEXIUM) 20 MG capsule Take 40 mg by mouth daily at 12 noon.    ezetimibe  (ZETIA ) 10 MG tablet TAKE 1 TABLET(10 MG) BY MOUTH DAILY   Furosemide  (FUROSCIX ) 80 MG/10ML CTKT Inject 1 Dose into the skin daily as needed (for swelling). Call the Heart Failure Clinic for further instruction before taking this medicine   ipratropium (ATROVENT ) 0.03 % nasal spray USE 2 SPRAYS IN EACH NOSTRIL EVERY 8 HOURS AS NEEDED   JARDIANCE  10 MG TABS tablet  TAKE 1 TABLET(10 MG) BY MOUTH DAILY BEFORE BREAKFAST   levocetirizine (XYZAL) 5 MG tablet Take 2.5 mg by mouth every other day as needed for allergies.   metolazone  (ZAROXOLYN ) 2.5 MG tablet Take 2.5 mg by mouth. Tuesday and Saturday   montelukast  (SINGULAIR ) 10 MG tablet TAKE 1 TABLET(10 MG) BY MOUTH DAILY   Multiple Vitamins-Minerals (EYE VITAMINS PO) Take 2 tablets by mouth daily. Ocuvite 50+   ondansetron  (ZOFRAN ) 4 MG tablet Take 1 tablet (4 mg total) by mouth every 8 (eight) hours as needed for nausea or vomiting.   polyethylene glycol (MIRALAX  / GLYCOLAX ) 17 g packet Take 17 g by mouth daily as needed.   potassium chloride  SA (KLOR-CON  M) 20 MEQ tablet Take 2 tablets (40 mEq total) by mouth 2 (two) times daily.   pramipexole  (MIRAPEX )  0.75 MG tablet TAKE 1 TABLET(0.75 MG) BY MOUTH AT BEDTIME   Semaglutide -Weight Management 0.25 MG/0.5ML SOAJ Inject 0.25 mg into the skin once a week for 28 days.   [START ON 12/28/2023] Semaglutide -Weight Management 0.5 MG/0.5ML SOAJ Inject 0.5 mg into the skin once a week for 28 days.   [START ON 01/26/2024] Semaglutide -Weight Management 1 MG/0.5ML SOAJ Inject 1 mg into the skin once a week for 28 days.   [START ON 02/24/2024] Semaglutide -Weight Management 1.7 MG/0.75ML SOAJ Inject 1.7 mg into the skin once a week for 28 days.   [START ON 03/24/2024] Semaglutide -Weight Management 2.4 MG/0.75ML SOAJ Inject 2.4 mg into the skin once a week.   spironolactone  (ALDACTONE ) 25 MG tablet Take 1 tablet (25 mg total) by mouth daily.   torsemide  (DEMADEX ) 100 MG tablet Take 1.5 tablets (150 mg total) by mouth daily. (Patient taking differently: Take 100 mg by mouth daily. 1 tablet (100 mg) each morning and 1/2 tablet (50 mg) at bedtime)   triamcinolone  (NASACORT ) 55 MCG/ACT AERO nasal inhaler Place 2 sprays into the nose daily.   triamcinolone  ointment (KENALOG ) 0.5 % Apply 1 Application topically 2 (two) times daily as needed.     Allergies:   Erythromycin, Losartan, Metoprolol, Simvastatin, Dexlansoprazole, Lisinopril, and Prednisone    Social History   Socioeconomic History   Marital status: Divorced    Spouse name: Not on file   Number of children: 1   Years of education: Not on file   Highest education level: Bachelor's degree (e.g., BA, AB, BS)  Occupational History   Occupation: retired in 2001    Comment: social work production designer, theatre/television/film  Tobacco Use   Smoking status: Former    Current packs/day: 0.00    Average packs/day: 1 pack/day for 11.0 years (11.0 ttl pk-yrs)    Types: Cigarettes    Start date: 11/29/1965    Quit date: 11/28/1976    Years since quitting: 47.0   Smokeless tobacco: Never  Vaping Use   Vaping status: Never Used  Substance and Sexual Activity   Alcohol use: Not Currently     Comment: rarely, at holidays   Drug use: No   Sexual activity: Not Currently  Other Topics Concern   Not on file  Social History Narrative   Not on file   Social Drivers of Health   Financial Resource Strain: Low Risk  (12/27/2022)   Overall Financial Resource Strain (CARDIA)    Difficulty of Paying Living Expenses: Not hard at all  Food Insecurity: No Food Insecurity (12/21/2022)   Hunger Vital Sign    Worried About Running Out of Food in the Last Year: Never true    Ran Out  of Food in the Last Year: Never true  Transportation Needs: No Transportation Needs (12/27/2022)   PRAPARE - Administrator, Civil Service (Medical): No    Lack of Transportation (Non-Medical): No  Physical Activity: Inactive (02/24/2022)   Exercise Vital Sign    Days of Exercise per Week: 0 days    Minutes of Exercise per Session: 0 min  Stress: No Stress Concern Present (02/24/2022)   [employer] of Occupational Health - Occupational Stress Questionnaire    Feeling of Stress : Not at all  Social Connections: Unknown (02/24/2022)   Social Connection and Isolation Panel [NHANES]    Frequency of Communication with Friends and Family: More than three times a week    Frequency of Social Gatherings with Friends and Family: Once a week    Attends Religious Services: Never    Database Administrator or Organizations: No    Attends Engineer, Structural: Never    Marital Status: Not on file     Family History: The patient's family history includes Diabetes in her father; Heart disease (age of onset: 44) in her father; Hyperlipidemia in her mother; Hypertension in her father and mother; Myelodysplastic syndrome in her sister; Prostate cancer in her paternal grandfather; Seizures in her mother. There is no history of Breast cancer or Cervical cancer.  ROS:   Please see the history of present illness.     All other systems reviewed and are negative.  EKGs/Labs/Other Studies Reviewed:    The  following studies were reviewed today:   EKG:  EKG not ordered today.    Recent Labs: 04/19/2023: TSH 2.830 06/16/2023: B Natriuretic Peptide 31.9 07/01/2023: ALT 14 09/08/2023: BUN 42; Creatinine, Ser 1.58; Potassium 3.6; Sodium 133  Recent Lipid Panel    Component Value Date/Time   CHOL 212 (H) 07/01/2023 1109   TRIG 137 07/01/2023 1109   HDL 61 07/01/2023 1109   CHOLHDL 3.5 07/01/2023 1109   LDLCALC 127 (H) 07/01/2023 1109     Risk Assessment/Calculations:      Physical Exam:    VS:  BP 98/68 (BP Location: Left Arm, Patient Position: Sitting, Cuff Size: Large)   Pulse 81   Ht 5' 1 (1.549 m)   Wt 232 lb 6.4 oz (105.4 kg)   SpO2 99%   BMI 43.91 kg/m     Wt Readings from Last 3 Encounters:  11/29/23 232 lb 6.4 oz (105.4 kg)  10/11/23 231 lb 12.8 oz (105.1 kg)  09/08/23 233 lb (105.7 kg)     GEN:  Well nourished, well developed in no acute distress HEENT: Normal NECK: Unable to assess JVD due to body habitus CARDIAC: RRR, no murmurs, rubs, gallops RESPIRATORY: Decreased breath sounds at bases ABDOMEN: Soft, non-tender, non-distended MUSCULOSKELETAL:  2+ edema; right foot in Unna boot SKIN: Warm and dry NEUROLOGIC:  Alert and oriented x 3 PSYCHIATRIC:  Normal affect   ASSESSMENT:    1. Chronic diastolic heart failure (HCC)   2. Primary hypertension   3. Morbid obesity (HCC)    PLAN:    In order of problems listed above:  HFpEF, bilateral lower extremity edema.  Continue torsemide  to 100 mg a.m., 50 mg p.m, Aldactone . advised to take Furoscix  x 1.  Check BMP today.  Has appointment with CHF service in about 3 months, we will plan to move appointment with heart failure service earlier. Hypertension, BP controlled/low normal. continue Aldactone  25 mg daily. Morbid obesity, start Wegovy , again advised to cut back  on sweets.  Follow-up in 3 to 4 months.   Medication Adjustments/Labs and Tests Ordered: Current medicines are reviewed at length with the patient  today.  Concerns regarding medicines are outlined above.  Orders Placed This Encounter  Procedures   Basic metabolic panel   EKG 12-Lead    Meds ordered this encounter  Medications   Semaglutide -Weight Management 0.25 MG/0.5ML SOAJ    Sig: Inject 0.25 mg into the skin once a week for 28 days.    Dispense:  2 mL    Refill:  0   Semaglutide -Weight Management 0.5 MG/0.5ML SOAJ    Sig: Inject 0.5 mg into the skin once a week for 28 days.    Dispense:  2 mL    Refill:  0   Semaglutide -Weight Management 1 MG/0.5ML SOAJ    Sig: Inject 1 mg into the skin once a week for 28 days.    Dispense:  2 mL    Refill:  0   Semaglutide -Weight Management 1.7 MG/0.75ML SOAJ    Sig: Inject 1.7 mg into the skin once a week for 28 days.    Dispense:  3 mL    Refill:  0   Semaglutide -Weight Management 2.4 MG/0.75ML SOAJ    Sig: Inject 2.4 mg into the skin once a week.    Dispense:  3 mL    Refill:  1      Patient Instructions  Medication Instructions:  Your physician recommends the following medication changes.  START TAKING: Wegovy  (semaglutide  injection) as per formulary once per week  *If you need a refill on your cardiac medications before your next appointment, please call your pharmacy*   Lab Work: Your provider would like for you to have following labs drawn today BMP.   If you have labs (blood work) drawn today and your tests are completely normal, you will receive your results only by: MyChart Message (if you have MyChart) OR A paper copy in the mail If you have any lab test that is abnormal or we need to change your treatment, we will call you to review the results.   Follow-Up: At [facility], you and your health needs are our priority.  As part of our continuing mission to provide you with exceptional heart care, we have created designated Provider Care Teams.  These Care Teams include your primary Cardiologist (physician) and Advanced Practice Providers (APPs -   Physician Assistants and Nurse Practitioners) who all work together to provide you with the care you need, when you need it.  We recommend signing up for the patient portal called MyChart.  Sign up information is provided on this After Visit Summary.  MyChart is used to connect with patients for Virtual Visits (Telemedicine).  Patients are able to view lab/test results, encounter notes, upcoming appointments, etc.  Non-urgent messages can be sent to your provider as well.   To learn more about what you can do with MyChart, go to [email or web address].    Your next appointment:   4-5 month(s)  Provider:   You may see Redell Cave, MD or one of the following Advanced Practice Providers on your designated Care Team:   Lonni Meager, NP Bernardino Bring, PA-C Cadence Franchester, PA-C Tylene Lunch, NP Barnie Hila, NP      Signed, Redell Cave, MD  11/29/2023 3:14 PM     [facility]

## 2023-11-30 LAB — BASIC METABOLIC PANEL
BUN/Creatinine Ratio: 25 (ref 12–28)
BUN: 34 mg/dL — ABNORMAL HIGH (ref 8–27)
CO2: 23 mmol/L (ref 20–29)
Calcium: 9.6 mg/dL (ref 8.7–10.3)
Chloride: 89 mmol/L — ABNORMAL LOW (ref 96–106)
Creatinine, Ser: 1.36 mg/dL — ABNORMAL HIGH (ref 0.57–1.00)
Glucose: 121 mg/dL — ABNORMAL HIGH (ref 70–99)
Potassium: 4.3 mmol/L (ref 3.5–5.2)
Sodium: 130 mmol/L — ABNORMAL LOW (ref 134–144)
eGFR: 39 mL/min/{1.73_m2} — ABNORMAL LOW (ref >59)

## 2023-12-01 ENCOUNTER — Other Ambulatory Visit (HOSPITAL_COMMUNITY): Payer: Self-pay

## 2023-12-01 ENCOUNTER — Telehealth: Payer: Self-pay | Admitting: Pharmacy Technician

## 2023-12-01 NOTE — Telephone Encounter (Signed)
 Pharmacy Patient Advocate Encounter   Received notification from CoverMyMeds that prior authorization for Wegovy  0.25MG /0.5ML auto-injectors is required/requested.   Insurance verification completed.   The patient is insured through [facility] .   Per test claim: PA required; PA submitted to above mentioned insurance via CoverMyMeds Key/confirmation #/[id number] [employer] Status is pending

## 2023-12-01 NOTE — Telephone Encounter (Signed)
 Pharmacy Patient Advocate Encounter  Received notification from [facility] that Prior Authorization for Wegovy  0.25MG /0.5ML auto-injectors  has been APPROVED from 12/01/23 to 11/30/24. Ran test claim, Copay is $638.80 for 1 month. This test claim was processed through [facility]- copay amounts may vary at other pharmacies due to pharmacy/plan contracts, or as the patient moves through the different stages of their insurance plan.

## 2023-12-21 ENCOUNTER — Ambulatory Visit: Payer: Self-pay

## 2023-12-21 NOTE — Telephone Encounter (Signed)
Copied from CRM [id number]. Topic: Appointment Scheduling - Scheduling Inquiry for Clinic >> Dec 21, 2023  2:36 PM Turkey B wrote: Reason for CRM: pt called in, NT was trying to schedule her for tomorrow for her leg swelling, but she says she can't come because of the weather and wants to schedule for sometime next week, Monday, Tuesday or Wednesday

## 2023-12-21 NOTE — Telephone Encounter (Signed)
  Chief Complaint: swelling and weeping fluid right lateral lower leg/? Ankle (pt cannot see for sure) Symptoms: clear fluid Frequency: 1.5 weeks ago Pertinent Negatives: Patient denies pain, redness or fever Disposition: [] ED /[] Urgent Care (no appt availability in office) / [] Appointment(In office/virtual)/ [x]  [facility] [place] [facility]/ [] Home Care/ [] Refused Recommended Disposition /[] [place] [facility]/ []  Follow-up with PCP Additional Notes: pt made appt for tomorrow. Pt staed would prefer appt for thiscoming Friday. Pt will only see Dr. Beryle Flock. DId officer [facility]. Pt refused  Reason for Disposition  SEVERE leg swelling (e.g., swelling extends above knee, entire leg is swollen, weeping fluid)  Answer Assessment - Initial Assessment Questions 1. ONSET: "When did the swelling start?" (e.g., minutes, hours, days)     1.5 week ago  2. LOCATION: "What part of the leg is swollen?"  "Are both legs swollen or just one leg?"     Right lower right side// ? Right lateral edema  3. SEVERITY: "How bad is the swelling?" (e.g., localized; mild, moderate, severe)   - Localized: Small area of swelling localized to one leg.   - MILD pedal edema: Swelling limited to foot and ankle, pitting edema < 1/4 inch (6 mm) deep, rest and elevation eliminate most or all swelling.   - MODERATE edema: Swelling of lower leg to knee, pitting edema > 1/4 inch (6 mm) deep, rest and elevation only partially reduce swelling.   - SEVERE edema: Swelling extends above knee, facial or hand swelling present.      weeping 4. REDNESS: "Does the swelling look red or infected?"     no 5. PAIN: "Is the swelling painful to touch?" If Yes, ask: "How painful is it?"   (Scale 1-10; mild, moderate or severe)     Not painful  6. FEVER: "Do you have a fever?" If Yes, ask: "What is it, how was it measured, and when did it start?"      no 7. CAUSE: "What do you think is causing the leg swelling?"     Has swelling at baseline 8.  MEDICAL HISTORY: "Do you have a history of blood clots (e.g., DVT), cancer, heart failure, kidney disease, or liver failure?"     Hear disease 9. RECURRENT SYMPTOM: "Have you had leg swelling before?" If Yes, ask: "When was the last time?" "What happened that time?"     no 10. OTHER SYMPTOMS: "Do you have any other symptoms?" (e.g., chest pain, difficulty breathing)       no  Protocols used: Leg Swelling and Edema-A-AH

## 2023-12-22 ENCOUNTER — Ambulatory Visit: Payer: Medicare Other | Admitting: Family Medicine

## 2023-12-22 NOTE — Telephone Encounter (Signed)
I think this is just a request for reschedule

## 2023-12-22 NOTE — Telephone Encounter (Signed)
Left vm for pt to call back to see if she wants to r/s her appt that is for today.  If patient wants to r/s it is okay for [facility] to r/s.

## 2023-12-30 ENCOUNTER — Telehealth: Payer: Self-pay | Admitting: Family Medicine

## 2023-12-30 MED ORDER — MONTELUKAST SODIUM 10 MG PO TABS: ORAL_TABLET | ORAL | 3 refills | Status: AC

## 2023-12-30 NOTE — Telephone Encounter (Signed)
[facility] faxed refill request for the following medications:   montelukast (SINGULAIR) 10 MG tablet   Please advise.  

## 2024-01-09 ENCOUNTER — Other Ambulatory Visit: Payer: Self-pay | Admitting: Physician Assistant

## 2024-01-09 DIAGNOSIS — J3089 Other allergic rhinitis: Secondary | ICD-10-CM

## 2024-01-11 ENCOUNTER — Telehealth: Payer: Self-pay | Admitting: Internal Medicine

## 2024-01-11 DIAGNOSIS — I5032 Chronic diastolic (congestive) heart failure: Secondary | ICD-10-CM

## 2024-01-12 ENCOUNTER — Encounter: Payer: Medicare Other | Admitting: Internal Medicine

## 2024-01-12 ENCOUNTER — Ambulatory Visit: Payer: Medicare Other | Admitting: Family Medicine

## 2024-01-19 ENCOUNTER — Ambulatory Visit: Payer: Medicare Other | Admitting: Family Medicine

## 2024-01-23 ENCOUNTER — Ambulatory Visit (INDEPENDENT_AMBULATORY_CARE_PROVIDER_SITE_OTHER): Payer: Medicare Other | Admitting: Family Medicine

## 2024-01-23 VITALS — BP 122/72 | HR 76 | Resp 20 | Ht 61.0 in | Wt 220.0 lb

## 2024-01-23 DIAGNOSIS — J411 Mucopurulent chronic bronchitis: Secondary | ICD-10-CM

## 2024-01-23 DIAGNOSIS — M10371 Gout due to renal impairment, right ankle and foot: Secondary | ICD-10-CM

## 2024-01-23 DIAGNOSIS — D692 Other nonthrombocytopenic purpura: Secondary | ICD-10-CM | POA: Diagnosis not present

## 2024-01-23 DIAGNOSIS — I152 Hypertension secondary to endocrine disorders: Secondary | ICD-10-CM | POA: Diagnosis not present

## 2024-01-23 DIAGNOSIS — E1122 Type 2 diabetes mellitus with diabetic chronic kidney disease: Secondary | ICD-10-CM | POA: Diagnosis not present

## 2024-01-23 DIAGNOSIS — I5032 Chronic diastolic (congestive) heart failure: Secondary | ICD-10-CM | POA: Diagnosis not present

## 2024-01-23 DIAGNOSIS — H353210 Exudative age-related macular degeneration, right eye, stage unspecified: Secondary | ICD-10-CM | POA: Diagnosis not present

## 2024-01-23 DIAGNOSIS — N1832 Chronic kidney disease, stage 3b: Secondary | ICD-10-CM | POA: Diagnosis not present

## 2024-01-23 DIAGNOSIS — E785 Hyperlipidemia, unspecified: Secondary | ICD-10-CM | POA: Diagnosis not present

## 2024-01-23 DIAGNOSIS — E1159 Type 2 diabetes mellitus with other circulatory complications: Secondary | ICD-10-CM

## 2024-01-23 DIAGNOSIS — E1169 Type 2 diabetes mellitus with other specified complication: Secondary | ICD-10-CM | POA: Diagnosis not present

## 2024-01-23 MED ORDER — TRELEGY ELLIPTA 100-62.5-25 MCG/ACT IN AEPB: 1.0000 | INHALATION_SPRAY | Freq: Every day | RESPIRATORY_TRACT | 11 refills | Status: AC

## 2024-01-23 MED ORDER — PREDNISONE 10 MG PO TABS: ORAL_TABLET | ORAL | 0 refills | Status: DC

## 2024-01-23 NOTE — Progress Notes (Signed)
 Established patient visit   Patient: Gloria Mercer   DOB: 08-29-1941   83 y.o. Female  MRN: [id number] Visit Date: 01/23/2024  Today's healthcare provider: [name], MD   Chief Complaint  Patient presents with   Medical Management of Chronic Issues   Toe Injury    Right foot, second toe, red, swollen and painful when she woke Saturday morning - unknown injury - patient feels like it is broken   Subjective    HPI HPI     Toe Injury    Additional comments: Right foot, second toe, red, swollen and painful when she woke Saturday morning - unknown injury - patient feels like it is broken      Last edited by Ashok Cordia, CMA on 01/23/2024 10:22 AM.       Discussed the use of AI scribe software for clinical note transcription with the patient, who gave verbal consent to proceed.  History of Present Illness   The patient, with a history of diabetes, hypertension, chronic bronchitis, and kidney issues, presents with a painful, swollen toe. The patient woke up with the symptoms and does not recall any injury to the toe. The patient also reports difficulty with mobility due to knee and back pain. The patient has previously received shots for the knee pain, but reports that she did not provide much relief. The patient also mentions struggling with weight management, despite efforts to control diet. The patient has been prescribed various medications, including metolazone, torsemide, spironolactone, Jardiance, Zetia, and a potassium supplement. The patient has stopped taking Ozempic and Trelegy due to cost.         Medications: Outpatient Medications Prior to Visit  Medication Sig   azelastine (ASTELIN) 0.1 % nasal spray USE 2 SPRAYS IN EACH NOSTRIL DAILY   bisacodyl (DULCOLAX) 5 MG EC tablet Take 5 mg by mouth daily as needed for moderate constipation.   Cholecalciferol (VITAMIN D3) 5000 units CAPS Take 1 capsule by mouth daily.   clotrimazole-betamethasone  (LOTRISONE) cream APPLY EXTERNALLY TO THE AFFECTED AREA TWICE DAILY AS NEEDED   esomeprazole (NEXIUM) 20 MG capsule Take 40 mg by mouth daily at 12 noon.    ezetimibe (ZETIA) 10 MG tablet TAKE 1 TABLET(10 MG) BY MOUTH DAILY   Furosemide (FUROSCIX) 80 MG/10ML CTKT Inject 1 Dose into the skin daily as needed (for swelling). Call the [facility] for further instruction before taking this medicine   ipratropium (ATROVENT) 0.03 % nasal spray USE 2 SPRAYS IN EACH NOSTRIL EVERY 8 HOURS AS NEEDED   JARDIANCE 10 MG TABS tablet TAKE 1 TABLET(10 MG) BY MOUTH DAILY BEFORE BREAKFAST   levocetirizine (XYZAL) 5 MG tablet Take 2.5 mg by mouth every other day as needed for allergies.   metolazone (ZAROXOLYN) 2.5 MG tablet Take 2.5 mg by mouth. Tuesday and Saturday   montelukast (SINGULAIR) 10 MG tablet TAKE 1 TABLET(10 MG) BY MOUTH DAILYTAKE 1 TABLET(10 MG) BY MOUTH DAILY   Multiple Vitamins-Minerals (EYE VITAMINS PO) Take 2 tablets by mouth daily. Ocuvite 50+   ondansetron (ZOFRAN) 4 MG tablet Take 1 tablet (4 mg total) by mouth every 8 (eight) hours as needed for nausea or vomiting.   polyethylene glycol (MIRALAX / GLYCOLAX) 17 g packet Take 17 g by mouth daily as needed.   potassium chloride SA (KLOR-CON M) 20 MEQ tablet Take 2 tablets (40 mEq total) by mouth 2 (two) times daily.   pramipexole (MIRAPEX) 0.75 MG tablet TAKE 1 TABLET(0.75  MG) BY MOUTH AT BEDTIME   pregabalin (LYRICA) 25 MG capsule Take 1 capsule (25 mg total) by mouth 2 (two) times daily.   spironolactone (ALDACTONE) 25 MG tablet Take 1 tablet (25 mg total) by mouth daily.   torsemide (DEMADEX) 100 MG tablet Take 1.5 tablets (150 mg total) by mouth daily. (Patient taking differently: Take 100 mg by mouth daily. 1 tablet (100 mg) each morning and 1/2 tablet (50 mg) at bedtime)   triamcinolone (NASACORT) 55 MCG/ACT AERO nasal inhaler Place 2 sprays into the nose daily.   triamcinolone ointment (KENALOG) 0.5 % Apply 1 Application topically 2  (two) times daily as needed.   [DISCONTINUED] Semaglutide-Weight Management 0.5 MG/0.5ML SOAJ Inject 0.5 mg into the skin once a week for 28 days.   [DISCONTINUED] Semaglutide-Weight Management 1 MG/0.5ML SOAJ Inject 1 mg into the skin once a week for 28 days.   [DISCONTINUED] Semaglutide-Weight Management 1.7 MG/0.75ML SOAJ Inject 1.7 mg into the skin once a week for 28 days.   [DISCONTINUED] Semaglutide-Weight Management 2.4 MG/0.75ML SOAJ Inject 2.4 mg into the skin once a week.   No facility-administered medications prior to visit.    Review of Systems     Objective    BP 122/72 (BP Location: Left Arm, Patient Position: Sitting, Cuff Size: Large)   Pulse 76   Resp 20   Ht 5\' 1"  (1.549 m)   Wt 220 lb (99.8 kg) Comment: while holding on to grab bars  SpO2 95%   BMI 41.57 kg/m    Physical Exam Vitals reviewed.  Constitutional:      General: She is not in acute distress.    Appearance: Normal appearance. She is well-developed. She is not diaphoretic.  HENT:     Head: Normocephalic and atraumatic.  Eyes:     General: No scleral icterus.    Conjunctiva/sclera: Conjunctivae normal.  Neck:     Thyroid: No thyromegaly.  Cardiovascular:     Rate and Rhythm: Normal rate and regular rhythm.     Heart sounds: Normal heart sounds. No murmur heard. Pulmonary:     Effort: Pulmonary effort is normal. No respiratory distress.     Breath sounds: Normal breath sounds. No wheezing, rhonchi or rales.  Musculoskeletal:     Cervical back: Neck supple.     Right lower leg: Edema present.     Left lower leg: Edema present.     Comments: R 2nd toe - erythematous, swollen, TTP (even very mild palpation)  Lymphadenopathy:     Cervical: No cervical adenopathy.  Skin:    General: Skin is warm and dry.  Neurological:     Mental Status: She is alert and oriented to person, place, and time. Mental status is at baseline.  Psychiatric:        Mood and Affect: Mood normal.        Behavior:  Behavior normal.      No results found for any visits on 01/23/24.  Assessment & Plan     Problem List Items Addressed This Visit       Cardiovascular and Mediastinum   Hypertension associated with diabetes (HCC) - Primary   Blood pressure initially high but normalized to 122/72 after rest. Current medications include metolazone, torsemide, spironolactone, and potassium supplement. Patient dislikes metolazone due to frequent urination and incontinence. - Recheck blood pressure at next visit      Relevant Orders   Comprehensive metabolic panel   Senile purpura (HCC)   stable  Heart failure, unspecified (HCC)   Heart failure managed with metolazone, spironolactone, and torsemide. No new symptoms. Emphasized managing fluid status and potential diuretic side effects. - Continue current diuretic regimen        Respiratory   Bronchitis, mucopurulent recurrent (HCC)   Patient not currently using Trelegy due to cost. Discussed potential affordability with new Medicare plan year. - Send Trelegy prescription to pharmacy to check affordability - Discuss potential for medication assistance if needed        Endocrine   Hyperlipidemia associated with type 2 diabetes mellitus (HCC)   Currently managed with Zetia. - Continue Zetia      Relevant Orders   Comprehensive metabolic panel   Lipid panel   T2DM (type 2 diabetes mellitus) (HCC)   Follow-up for diabetes management. Last A1c was 6.6 six months ago. Discussed potential benefit of Ozempic for both diabetes and weight loss, but affordability is a concern. - Order A1c - Order comprehensive metabolic panel (CMP)      Relevant Orders   Hemoglobin A1c     Genitourinary   Stage 3b chronic kidney disease (HCC)   Chronic condition managed with current medications. - Order CMP to monitor kidney function      Relevant Orders   Comprehensive metabolic panel     Other   Age-related macular degeneration, wet, right eye  (HCC)   Patient has upcoming eye exam at the end of March. Importance of regular eye exams emphasized. - Ensure follow-up with eye doctor - Request eye exam notes from eye doctor      Morbid obesity [facility])   Discussed importance of healthy weight management Discussed diet and exercise Movement is limited by osteoarthritis and chronic pain as above      Other Visit Diagnoses       Acute gout due to renal impairment involving toe of right foot       Relevant Medications   predniSONE (DELTASONE) 10 MG tablet   Other Relevant Orders   Uric acid          Gout new onset. Acute onset of red, swollen, and painful toe since Saturday. No history of trauma. Differential includes gout vs. fracture. Given the presentation and risk factors (kidney function, diuretic use), gout is the leading diagnosis. Anti-inflammatories are not suitable due to kidney risks and fluid retention. Prednisone is recommended despite h/o GI upset and constipation, which can be managed with increased Miralax. - Order uric acid level - Prescribe prednisone taper (6-day course: 6 pills, 5, 4, 3, 2, 1) - Increase Miralax to morning and night while on prednisone  Osteoarthritis Severe knee pain limiting mobility. Previous cortisone and gel injections were not effective. Patient declined further invasive treatments such as nerve ablation due to concerns about the procedure. - Discuss potential for pain management referral or neurosurgery if patient changes mind  General Health Maintenance Routine health maintenance discussed. Urine test for diabetes and full foot exam due in summer. Patient struggles with transportation to appointments. Discussed Medicare transport options and case manager assistance for transportation. - Order urine test for diabetes at next visit - Perform full foot exam for diabetes at next visit  Follow-up - Schedule wellness visit in six months - Follow-up sooner if symptoms persist or new  issues arise.       Return in about 6 months (around 07/22/2024) for AWV, CPE.      Total time spent on today's visit was greater than 40 minutes, including both face-to-face time  and nonface-to-face time personally spent on review of chart (labs and imaging), discussing labs and goals, discussing further work-up, treatment options, answering patient's questions, and coordinating care.   [name], MD  [facility] [phone] (phone) [phone] (fax)  [facility]

## 2024-01-23 NOTE — Assessment & Plan Note (Signed)
 stable

## 2024-01-23 NOTE — Assessment & Plan Note (Signed)
 Follow-up for diabetes management. Last A1c was 6.6 six months ago. Discussed potential benefit of Ozempic for both diabetes and weight loss, but affordability is a concern. - Order A1c - Order comprehensive metabolic panel (CMP)

## 2024-01-23 NOTE — Assessment & Plan Note (Signed)
 Chronic condition managed with current medications. - Order CMP to monitor kidney function

## 2024-01-23 NOTE — Assessment & Plan Note (Signed)
 Currently managed with Zetia. - Continue Zetia

## 2024-01-23 NOTE — Assessment & Plan Note (Signed)
 Patient not currently using Trelegy due to cost. Discussed potential affordability with new Medicare plan year. - Send Trelegy prescription to pharmacy to check affordability - Discuss potential for medication assistance if needed

## 2024-01-23 NOTE — Assessment & Plan Note (Signed)
 Heart failure managed with metolazone, spironolactone, and torsemide. No new symptoms. Emphasized managing fluid status and potential diuretic side effects. - Continue current diuretic regimen

## 2024-01-23 NOTE — Assessment & Plan Note (Signed)
 Patient has upcoming eye exam at the end of March. Importance of regular eye exams emphasized. - Ensure follow-up with eye doctor - Request eye exam notes from eye doctor

## 2024-01-23 NOTE — Assessment & Plan Note (Signed)
 Blood pressure initially high but normalized to 122/72 after rest. Current medications include metolazone, torsemide, spironolactone, and potassium supplement. Patient dislikes metolazone due to frequent urination and incontinence. - Recheck blood pressure at next visit

## 2024-01-23 NOTE — Assessment & Plan Note (Signed)
 Discussed importance of healthy weight management Discussed diet and exercise Movement is limited by osteoarthritis and chronic pain as above

## 2024-01-24 ENCOUNTER — Encounter: Payer: Self-pay | Admitting: Family Medicine

## 2024-01-24 LAB — COMPREHENSIVE METABOLIC PANEL
ALT: 13 [IU]/L (ref 0–32)
AST: 12 [IU]/L (ref 0–40)
Albumin: 4 g/dL (ref 3.7–4.7)
Alkaline Phosphatase: 142 [IU]/L — ABNORMAL HIGH (ref 44–121)
BUN/Creatinine Ratio: 29 — ABNORMAL HIGH (ref 12–28)
BUN: 42 mg/dL — ABNORMAL HIGH (ref 8–27)
Bilirubin Total: 0.4 mg/dL (ref 0.0–1.2)
CO2: 28 mmol/L (ref 20–29)
Calcium: 9.8 mg/dL (ref 8.7–10.3)
Chloride: 85 mmol/L — ABNORMAL LOW (ref 96–106)
Creatinine, Ser: 1.47 mg/dL — ABNORMAL HIGH (ref 0.57–1.00)
Globulin, Total: 2.3 g/dL (ref 1.5–4.5)
Glucose: 142 mg/dL — ABNORMAL HIGH (ref 70–99)
Potassium: 3.6 mmol/L (ref 3.5–5.2)
Sodium: 133 mmol/L — ABNORMAL LOW (ref 134–144)
Total Protein: 6.3 g/dL (ref 6.0–8.5)
eGFR: 35 mL/min/{1.73_m2} — ABNORMAL LOW (ref >59)

## 2024-01-24 LAB — LIPID PANEL
Chol/HDL Ratio: 2.8 {ratio} (ref 0.0–4.4)
Cholesterol, Total: 187 mg/dL (ref 100–199)
HDL: 66 mg/dL (ref >39)
LDL Chol Calc (NIH): 98 mg/dL (ref 0–99)
Triglycerides: 130 mg/dL (ref 0–149)
VLDL Cholesterol Cal: 23 mg/dL (ref 5–40)

## 2024-01-24 LAB — URIC ACID: Uric Acid: 13 mg/dL — ABNORMAL HIGH (ref 3.1–7.9)

## 2024-01-24 LAB — HEMOGLOBIN A1C
Est. average glucose Bld gHb Est-mCnc: 151 mg/dL
Hgb A1c MFr Bld: 6.9 % — ABNORMAL HIGH (ref 4.8–5.6)

## 2024-01-24 MED ORDER — SEMAGLUTIDE(0.25 OR 0.5MG/DOS) 2 MG/3ML ~~LOC~~ SOPN
0.2500 mg | PEN_INJECTOR | SUBCUTANEOUS | 0 refills | Status: DC
Start: 2024-01-24 — End: 2024-04-11

## 2024-01-24 NOTE — Telephone Encounter (Signed)
 Copied from CRM [id number]. Topic: General - Call Back - No Documentation >> Jan 23, 2024  4:24 PM Higinio Roger wrote: Reason for CRM: Patient wants a callback to let Dr. Beryle Flock know she was able to get Trelegy for an affordable price and to please resubmit ozempic prescription. Callback #: [phone].

## 2024-01-24 NOTE — Telephone Encounter (Signed)
 Ok to send ozempic 0.25mg  weekly x2 weeks and then 0.5mg  weekly.

## 2024-01-27 ENCOUNTER — Other Ambulatory Visit: Payer: Self-pay | Admitting: Family Medicine

## 2024-01-30 NOTE — Telephone Encounter (Signed)
 Requested Prescriptions  Pending Prescriptions Disp Refills   pramipexole (MIRAPEX) 0.75 MG tablet [Pharmacy Med Name: PRAMIPEXOLE 0.75MG  TABLETS] 90 tablet 0    Sig: TAKE 1 TABLET(0.75 MG) BY MOUTH AT BEDTIME     Neurology:  Parkinsonian Agents Passed - 01/30/2024  1:39 PM      Passed - Last BP in normal range    BP Readings from Last 1 Encounters:  01/23/24 122/72         Passed - Last Heart Rate in normal range    Pulse Readings from Last 1 Encounters:  01/23/24 76         Passed - Valid encounter within last 12 months    Recent Outpatient Visits           3 months ago Hypertension associated with diabetes [facility])   [place] [facility] [place], Marzella Schlein, MD   5 months ago Postural kyphosis of cervicothoracic region   [facility] [place], Marzella Schlein, MD   6 months ago Encounter for annual wellness visit (AWV) in Medicare patient   [place] [facility] [place], Marzella Schlein, MD   8 months ago Chronic pain syndrome   [place] [facility] [place], Marzella Schlein, MD   9 months ago Spinal stenosis of lumbar region without neurogenic claudication   [facility], Marzella Schlein, MD       Future Appointments             In 1 month Agbor-Etang, Arlys John, MD [facility] [place]   In 5 months [name], Marzella Schlein, MD [facility], [facility]

## 2024-02-02 ENCOUNTER — Telehealth: Payer: Self-pay | Admitting: Family Medicine

## 2024-02-02 NOTE — Telephone Encounter (Signed)
 [facility] pharmacy is requesting refill SPIRIVA RESPIMAT 2.5MCGINH 4GM 60D Not on current list Please advise

## 2024-02-03 ENCOUNTER — Telehealth: Payer: Self-pay | Admitting: Internal Medicine

## 2024-02-03 NOTE — Telephone Encounter (Signed)
 Pt confirmed appt for 02/06/24

## 2024-02-03 NOTE — Telephone Encounter (Signed)
 She is not taking this anymore.  She is on Trelegy.

## 2024-02-05 NOTE — Progress Notes (Deleted)
 ADVANCED HF CLINIC NOTE  Referring Physician: Erasmo Downer, MD Primary Care: Erasmo Downer, MD Primary Cardiologist: Debbe Odea, MD  HPI:  Gloria Mercer is a 83 y.o. female with a hx of HFpEF, hypertension, obesity, CKD 3b.   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 HST 6/22 AHI 13.5 with sat down to 81%   Echocardiogram 7/24 EF 60-65% G1DD RV ok  Remains on metolazone 2.5 on Tues and Saturdays. Had been on torsemide 100/50 but Nephrology stopped the evening dose as SCr bumped from 1.41-> 1.67. At last visit we bumped back to 100/50 due to volume overload and prescribed Furoscix  Returns for f/u. Doing ok but limited by severe ortho issues with knee and hip pain. Now having HHPT/OT  Still drinking a lot of fluid. Have cut back fluid some. Dyspnea comes and goes. Sleeps in recliner. No PND.    Past Medical History:  Diagnosis Date   Allergic rhinitis    on allergy shots, sees Dr. Andee Poles   Anemia    Arthritis    B12 deficiency    Back pain    Bilateral edema of lower extremity    Cataract    CHF (congestive heart failure) (HCC)    Constipation    Edema    Gallbladder problem    GERD (gastroesophageal reflux disease)    History of stomach ulcers    Hyperlipidemia    Hypertension    Joint pain    Macular degeneration    followed by Dr Cherly Hensen at [facility]   Osteoarthritis    Other fatigue    Prediabetes    Proteinuria    RLS (restless legs syndrome)    Sciatica    Shortness of breath    Shortness of breath on exertion    Sleep apnea    Stage 3 chronic kidney disease (HCC)    Swallowing difficulty    Vitamin D deficiency     Current Outpatient Medications  Medication Sig Dispense Refill   azelastine (ASTELIN) 0.1 % nasal spray USE 2 SPRAYS IN EACH NOSTRIL DAILY 30 mL 11   bisacodyl (DULCOLAX) 5 MG EC tablet Take 5 mg by  mouth daily as needed for moderate constipation.     Cholecalciferol (VITAMIN D3) 5000 units CAPS Take 1 capsule by mouth daily.     clotrimazole-betamethasone (LOTRISONE) cream APPLY EXTERNALLY TO THE AFFECTED AREA TWICE DAILY AS NEEDED 60 g 0   esomeprazole (NEXIUM) 20 MG capsule Take 40 mg by mouth daily at 12 noon.      ezetimibe (ZETIA) 10 MG tablet TAKE 1 TABLET(10 MG) BY MOUTH DAILY 90 tablet 2   Fluticasone-Umeclidin-Vilant (TRELEGY ELLIPTA) 100-62.5-25 MCG/ACT AEPB Inhale 1 puff into the lungs daily. 1 each 11   Furosemide (FUROSCIX) 80 MG/10ML CTKT Inject 1 Dose into the skin daily as needed (for swelling). Call the Heart Failure Clinic for further instruction before taking this medicine     ipratropium (ATROVENT) 0.03 % nasal spray USE 2 SPRAYS IN EACH NOSTRIL EVERY 8 HOURS AS NEEDED 90 mL 1   JARDIANCE 10 MG TABS tablet TAKE 1 TABLET(10 MG) BY MOUTH DAILY BEFORE BREAKFAST 30 tablet 6   levocetirizine (XYZAL) 5 MG tablet Take 2.5 mg by mouth every other day as needed for allergies.     metolazone (ZAROXOLYN) 2.5 MG tablet Take 2.5 mg by mouth. Tuesday  and Saturday     montelukast (SINGULAIR) 10 MG tablet TAKE 1 TABLET(10 MG) BY MOUTH DAILYTAKE 1 TABLET(10 MG) BY MOUTH DAILY 90 tablet 3   Multiple Vitamins-Minerals (EYE VITAMINS PO) Take 2 tablets by mouth daily. Ocuvite 50+     ondansetron (ZOFRAN) 4 MG tablet Take 1 tablet (4 mg total) by mouth every 8 (eight) hours as needed for nausea or vomiting. 20 tablet 0   polyethylene glycol (MIRALAX / GLYCOLAX) 17 g packet Take 17 g by mouth daily as needed.     potassium chloride SA (KLOR-CON M) 20 MEQ tablet Take 2 tablets (40 mEq total) by mouth 2 (two) times daily. 360 tablet 0   pramipexole (MIRAPEX) 0.75 MG tablet TAKE 1 TABLET(0.75 MG) BY MOUTH AT BEDTIME 90 tablet 0   predniSONE (DELTASONE) 10 MG tablet Take 60mg  PO daily x1d, then 50mg  daily x1d, then 40mg  daily x1d, then 30mg  daily x1d, then 20mg  daily x1d, then 10mg  daily x1d, then  stop 21 tablet 0   pregabalin (LYRICA) 25 MG capsule Take 1 capsule (25 mg total) by mouth 2 (two) times daily. 60 capsule 1   Semaglutide,0.25 or 0.5MG /DOS, 2 MG/3ML SOPN Inject 0.25 mg into the skin once a week. Inject 0.25 mg into the skin once a week for 2 weeks and then 0.5 mg into the skin once a week 3 mL 0   spironolactone (ALDACTONE) 25 MG tablet Take 1 tablet (25 mg total) by mouth daily. 90 tablet 3   torsemide (DEMADEX) 100 MG tablet Take 1.5 tablets (150 mg total) by mouth daily. (Patient taking differently: Take 100 mg by mouth daily. 1 tablet (100 mg) each morning and 1/2 tablet (50 mg) at bedtime) 135 tablet 3   triamcinolone (NASACORT) 55 MCG/ACT AERO nasal inhaler Place 2 sprays into the nose daily.     triamcinolone ointment (KENALOG) 0.5 % Apply 1 Application topically 2 (two) times daily as needed.     No current facility-administered medications for this visit.    Allergies  Allergen Reactions   Erythromycin Other (See Comments)    Other reaction(s): Headache Headache and GI upset Headache and GI upset   Losartan Nausea And Vomiting   Metoprolol Nausea And Vomiting   Simvastatin Other (See Comments)    Other reaction(s): Muscle Pain Pain lower extremities Pain lower extremities   Dexlansoprazole Diarrhea   Lisinopril Cough   Prednisone     Other reaction(s): Constipation GI upset      Social History   Socioeconomic History   Marital status: Divorced    Spouse name: Not on file   Number of children: 1   Years of education: Not on file   Highest education level: Bachelor's degree (e.g., BA, AB, BS)  Occupational History   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, theatre/television/film  Tobacco Use   Smoking status: Former    Current packs/day: 0.00    Average packs/day: 1 pack/day for 11.0 years (11.0 ttl pk-yrs)    Types: Cigarettes    Start date: 11/29/1965    Quit date: 11/28/1976    Years since quitting: 47.2   Smokeless tobacco: Never   Vaping Use   Vaping status: Never Used  Substance and Sexual Activity   Alcohol use: Not Currently    Comment: rarely, at holidays   Drug use: No   Sexual activity: Not Currently  Other Topics Concern   Not on file  Social History Narrative   Not on file  Social Drivers of Corporate investment banker Strain: Low Risk  (12/27/2022)   Overall Financial Resource Strain (CARDIA)    Difficulty of Paying Living Expenses: Not hard at all  Food Insecurity: No Food Insecurity (12/21/2022)   Hunger Vital Sign    Worried About Running Out of Food in the Last Year: Never true    Ran Out of Food in the Last Year: Never true  Transportation Needs: No Transportation Needs (12/27/2022)   PRAPARE - Administrator, Civil Service (Medical): No    Lack of Transportation (Non-Medical): No  Physical Activity: Inactive (02/24/2022)   Exercise Vital Sign    Days of Exercise per Week: 0 days    Minutes of Exercise per Session: 0 min  Stress: No Stress Concern Present (02/24/2022)   [employer] of Occupational Health - Occupational Stress Questionnaire    Feeling of Stress : Not at all  Social Connections: Unknown (02/24/2022)   Social Connection and Isolation Panel [NHANES]    Frequency of Communication with Friends and Family: More than three times a week    Frequency of Social Gatherings with Friends and Family: Once a week    Attends Religious Services: Never    Database administrator or Organizations: No    Attends Banker Meetings: Never    Marital Status: Not on file  Intimate Partner Violence: Not At Risk (02/24/2022)   Humiliation, Afraid, Rape, and Kick questionnaire    Fear of Current or Ex-Partner: No    Emotionally Abused: No    Physically Abused: No    Sexually Abused: No      Family History  Problem Relation Age of Onset   Hyperlipidemia Mother    Hypertension Mother    Seizures Mother    Hypertension Father    Heart disease Father 66        several heart attacks   Diabetes Father        diet controlled   Myelodysplastic syndrome Sister    Prostate cancer Paternal Grandfather    Breast cancer Neg Hx    Cervical cancer Neg Hx     There were no vitals filed for this visit.    Wt Readings from Last 3 Encounters:  01/23/24 220 lb (99.8 kg)  11/29/23 232 lb 6.4 oz (105.4 kg)  10/11/23 231 lb 12.8 oz (105.1 kg)    PHYSICAL EXAM: General:  Obese woman sitting in chair  No resp difficulty HEENT: normal Neck: supple. no JVD. Carotids 2+ bilat; no bruits. No lymphadenopathy or thryomegaly appreciated. Cor: PMI nondisplaced. Regular rate & rhythm. 2/6 TR Lungs: clear but decreased Abdomen: obese soft, nontender, nondistended. No hepatosplenomegaly. No bruits or masses. Good bowel sounds. Extremities: no cyanosis, clubbing, rash, 2+ ankle edema. No pretibial edema Neuro: alert & orientedx3, cranial nerves grossly intact. moves all 4 extremities w/o difficulty. Affect pleasant   ASSESSMENT & PLAN:  1. Chronic diastolic HF/lower extremity edema - suspect combination of diastolic HF and venous insufficiency - Echocardiogram 03/18/2021 EF 55-60% G1DD RV ok \ - Echocardiogram 7/24 EF 60-65% G1DD RV ok Personally reviewed - Stable NYHA III limited by weight and ortho issues  - Volume status improved still with some ankle edema but no pretibial edema.  - Continue torsemide 100/50 - encouraged her to use Furoscix as needed - Continue spiro 25 daily - Continue metolazone 2.5 Tues/Sat - Continue Jardiance 10  - Unable to use compression stockings -> failed using ACE wraps  2.  OSA - Mild to moderate AHI 13.5 - refuses CPAP  3. Morbid obesity - her weight is really impacting QoL - I have asked her to discuss GLP1RA with her PCP  4. CKD  3b - follows with Dr. Thedore Mins at [facility] - Continue Jardiance  - Management of diuretics as above - Check labs today  5. DM2 - Consider GLP1RA per PCP   Arvilla Meres, MD  4:12 PM

## 2024-02-06 ENCOUNTER — Encounter: Payer: Medicare Other | Admitting: Internal Medicine

## 2024-02-10 ENCOUNTER — Telehealth: Payer: Self-pay | Admitting: Family Medicine

## 2024-02-10 NOTE — Telephone Encounter (Signed)
 Patient would like guidance on self-administering Ozempic. Scheduled for 03/19 nurse visit with Dr.Fisher

## 2024-02-10 NOTE — Telephone Encounter (Signed)
 Copied from CRM [id number]. Topic: Clinical - Medical Advice >> Feb 10, 2024 11:39 AM Marland Kitchen D wrote: Patient called in to see if she could get help with her Ozempic and get more info on it. She would like to be called back.

## 2024-02-15 ENCOUNTER — Ambulatory Visit: Admitting: Family Medicine

## 2024-02-21 DIAGNOSIS — R600 Localized edema: Secondary | ICD-10-CM | POA: Diagnosis not present

## 2024-02-21 DIAGNOSIS — E790 Hyperuricemia without signs of inflammatory arthritis and tophaceous disease: Secondary | ICD-10-CM | POA: Diagnosis not present

## 2024-02-21 DIAGNOSIS — N1832 Chronic kidney disease, stage 3b: Secondary | ICD-10-CM | POA: Diagnosis not present

## 2024-02-21 DIAGNOSIS — N2581 Secondary hyperparathyroidism of renal origin: Secondary | ICD-10-CM | POA: Diagnosis not present

## 2024-02-21 DIAGNOSIS — I129 Hypertensive chronic kidney disease with stage 1 through stage 4 chronic kidney disease, or unspecified chronic kidney disease: Secondary | ICD-10-CM | POA: Diagnosis not present

## 2024-02-24 DIAGNOSIS — H353212 Exudative age-related macular degeneration, right eye, with inactive choroidal neovascularization: Secondary | ICD-10-CM | POA: Diagnosis not present

## 2024-02-24 DIAGNOSIS — H353122 Nonexudative age-related macular degeneration, left eye, intermediate dry stage: Secondary | ICD-10-CM | POA: Diagnosis not present

## 2024-03-01 ENCOUNTER — Telehealth: Payer: Self-pay | Admitting: Cardiology

## 2024-03-01 MED ORDER — POTASSIUM CHLORIDE CRYS ER 20 MEQ PO TBCR: 40.0000 meq | EXTENDED_RELEASE_TABLET | Freq: Two times a day (BID) | ORAL | 2 refills | Status: DC

## 2024-03-01 NOTE — Telephone Encounter (Signed)
*  STAT* If patient is at the pharmacy, call can be transferred to refill team.   1. Which medications need to be refilled? (please list name of each medication and dose if known)  potassium chloride SA (KLOR-CON M) 20 MEQ tablet  2. Which pharmacy/location (including street and city if local pharmacy) is medication to be sent to? [id number] - [place], [place] - [address] AT [address] OF [address]  3. Do they need a 30 day or 90 day supply?  90 day supply

## 2024-03-02 ENCOUNTER — Other Ambulatory Visit: Payer: Self-pay

## 2024-03-02 MED ORDER — POTASSIUM CHLORIDE CRYS ER 20 MEQ PO TBCR: 40.0000 meq | EXTENDED_RELEASE_TABLET | Freq: Two times a day (BID) | ORAL | 2 refills | Status: AC

## 2024-03-06 ENCOUNTER — Telehealth: Payer: Self-pay | Admitting: Internal Medicine

## 2024-03-06 NOTE — Telephone Encounter (Signed)
 Called r/s appt on 03/08/24

## 2024-03-08 ENCOUNTER — Encounter: Admitting: Internal Medicine

## 2024-03-13 ENCOUNTER — Telehealth: Payer: Self-pay

## 2024-03-13 NOTE — Telephone Encounter (Unsigned)
 Copied from CRM [id number]. Topic: General - Other >> Mar 09, 2024 11:00 AM Ethelle Herb L wrote: Reason for CRM: Pt calling to speak to Josie. Pt would like to reschedule missed appointment w/ Josie.   Would also like to talk to Josie about getting a time set for transportation  to appointment.   Best call back number: [phone].

## 2024-03-14 NOTE — Telephone Encounter (Signed)
 Appointment rescheduled.

## 2024-03-20 ENCOUNTER — Ambulatory Visit: Admitting: Family Medicine

## 2024-03-22 ENCOUNTER — Encounter: Admitting: Internal Medicine

## 2024-03-26 ENCOUNTER — Ambulatory Visit: Payer: Medicare Other | Admitting: Cardiology

## 2024-04-04 ENCOUNTER — Encounter (HOSPITAL_COMMUNITY): Payer: Self-pay

## 2024-04-05 DIAGNOSIS — N2581 Secondary hyperparathyroidism of renal origin: Secondary | ICD-10-CM | POA: Diagnosis not present

## 2024-04-05 DIAGNOSIS — I10 Essential (primary) hypertension: Secondary | ICD-10-CM | POA: Diagnosis not present

## 2024-04-05 DIAGNOSIS — E6689 Other obesity not elsewhere classified: Secondary | ICD-10-CM | POA: Diagnosis not present

## 2024-04-05 DIAGNOSIS — N1832 Chronic kidney disease, stage 3b: Secondary | ICD-10-CM | POA: Diagnosis not present

## 2024-04-05 DIAGNOSIS — E876 Hypokalemia: Secondary | ICD-10-CM | POA: Diagnosis not present

## 2024-04-05 DIAGNOSIS — K5900 Constipation, unspecified: Secondary | ICD-10-CM | POA: Diagnosis not present

## 2024-04-05 DIAGNOSIS — I129 Hypertensive chronic kidney disease with stage 1 through stage 4 chronic kidney disease, or unspecified chronic kidney disease: Secondary | ICD-10-CM | POA: Diagnosis not present

## 2024-04-05 DIAGNOSIS — E790 Hyperuricemia without signs of inflammatory arthritis and tophaceous disease: Secondary | ICD-10-CM | POA: Diagnosis not present

## 2024-04-05 DIAGNOSIS — I509 Heart failure, unspecified: Secondary | ICD-10-CM | POA: Diagnosis not present

## 2024-04-05 DIAGNOSIS — R600 Localized edema: Secondary | ICD-10-CM | POA: Diagnosis not present

## 2024-04-05 DIAGNOSIS — R609 Edema, unspecified: Secondary | ICD-10-CM | POA: Diagnosis not present

## 2024-04-06 ENCOUNTER — Ambulatory Visit: Admitting: Family Medicine

## 2024-04-10 ENCOUNTER — Other Ambulatory Visit: Payer: Self-pay | Admitting: Internal Medicine

## 2024-04-11 ENCOUNTER — Ambulatory Visit

## 2024-04-11 DIAGNOSIS — I5032 Chronic diastolic (congestive) heart failure: Secondary | ICD-10-CM

## 2024-04-11 MED ORDER — SEMAGLUTIDE(0.25 OR 0.5MG/DOS) 2 MG/3ML ~~LOC~~ SOPN: 0.2500 mg | PEN_INJECTOR | SUBCUTANEOUS | 0 refills | Status: DC

## 2024-04-11 NOTE — Progress Notes (Signed)
 Patient here to be instructed on how to used Ozempic .Patient was educated on the proper administration on Ozempic .   Patient also requested information on what to eat and what not to eat with diabetes. Information printed for patient.  Patient reports not aware that medicine needed to store in the refrigerator and has been having it since February on the counter and would like a new prescription sent in to be safe. Prescription sent in.

## 2024-04-20 ENCOUNTER — Ambulatory Visit: Admitting: Cardiology

## 2024-05-05 ENCOUNTER — Other Ambulatory Visit: Payer: Self-pay | Admitting: Family Medicine

## 2024-05-05 ENCOUNTER — Other Ambulatory Visit: Payer: Self-pay | Admitting: Cardiology

## 2024-05-07 ENCOUNTER — Other Ambulatory Visit: Payer: Self-pay

## 2024-05-07 DIAGNOSIS — E7800 Pure hypercholesterolemia, unspecified: Secondary | ICD-10-CM

## 2024-05-07 MED ORDER — EZETIMIBE 10 MG PO TABS: ORAL_TABLET | ORAL | 2 refills | Status: AC

## 2024-05-07 NOTE — Telephone Encounter (Signed)
 [facility]  [address]  is asking for refills on Ezetimibe  10 mg. #90 with 3 refills

## 2024-05-07 NOTE — Telephone Encounter (Signed)
 Requested Prescriptions  Pending Prescriptions Disp Refills   pramipexole  (MIRAPEX ) 0.75 MG tablet [Pharmacy Med Name: PRAMIPEXOLE  0.75MG  TABLETS] 90 tablet 0    Sig: TAKE 1 TABLET(0.75 MG) BY MOUTH AT BEDTIME     Neurology:  Parkinsonian Agents Passed - 05/07/2024  1:35 PM      Passed - Last BP in normal range    BP Readings from Last 1 Encounters:  01/23/24 122/72         Passed - Last Heart Rate in normal range    Pulse Readings from Last 1 Encounters:  01/23/24 76         Passed - Valid encounter within last 12 months    Recent Outpatient Visits           3 months ago Hypertension associated with diabetes [facility])   [facility] [place], Stan Eans, MD       Future Appointments             In 3 weeks Agbor-Etang, Polly Brink, MD [facility] [place]   In 2 months [name], Stan Eans, MD [facility]

## 2024-05-11 ENCOUNTER — Telehealth: Payer: Self-pay | Admitting: Internal Medicine

## 2024-05-11 NOTE — Telephone Encounter (Signed)
 Called to confirm/remind patient of their appointment at the [facility] on 05/14/24.   Appointment:   [x] Confirmed  [] Left mess   [] No answer/No voice mail  [] VM Full/unable to leave message  [] Phone not in service  Patient reminded to bring all medications and/or complete list.  Confirmed patient has transportation. Gave directions, instructed to utilize valet parking.

## 2024-05-14 ENCOUNTER — Encounter: Payer: Self-pay | Admitting: Internal Medicine

## 2024-05-14 ENCOUNTER — Ambulatory Visit: Attending: Internal Medicine | Admitting: Internal Medicine

## 2024-05-14 VITALS — BP 109/49 | HR 85 | Wt 216.8 lb

## 2024-05-14 DIAGNOSIS — Z8249 Family history of ischemic heart disease and other diseases of the circulatory system: Secondary | ICD-10-CM | POA: Insufficient documentation

## 2024-05-14 DIAGNOSIS — Z833 Family history of diabetes mellitus: Secondary | ICD-10-CM | POA: Insufficient documentation

## 2024-05-14 DIAGNOSIS — N1832 Chronic kidney disease, stage 3b: Secondary | ICD-10-CM | POA: Diagnosis not present

## 2024-05-14 DIAGNOSIS — Z79899 Other long term (current) drug therapy: Secondary | ICD-10-CM | POA: Diagnosis not present

## 2024-05-14 DIAGNOSIS — R600 Localized edema: Secondary | ICD-10-CM | POA: Diagnosis not present

## 2024-05-14 DIAGNOSIS — Z7985 Long-term (current) use of injectable non-insulin antidiabetic drugs: Secondary | ICD-10-CM | POA: Diagnosis not present

## 2024-05-14 DIAGNOSIS — I130 Hypertensive heart and chronic kidney disease with heart failure and stage 1 through stage 4 chronic kidney disease, or unspecified chronic kidney disease: Secondary | ICD-10-CM | POA: Diagnosis not present

## 2024-05-14 DIAGNOSIS — I5032 Chronic diastolic (congestive) heart failure: Secondary | ICD-10-CM | POA: Diagnosis not present

## 2024-05-14 DIAGNOSIS — M25559 Pain in unspecified hip: Secondary | ICD-10-CM | POA: Insufficient documentation

## 2024-05-14 DIAGNOSIS — M79606 Pain in leg, unspecified: Secondary | ICD-10-CM | POA: Diagnosis not present

## 2024-05-14 DIAGNOSIS — E1122 Type 2 diabetes mellitus with diabetic chronic kidney disease: Secondary | ICD-10-CM | POA: Insufficient documentation

## 2024-05-14 DIAGNOSIS — N183 Chronic kidney disease, stage 3 unspecified: Secondary | ICD-10-CM

## 2024-05-14 DIAGNOSIS — G4733 Obstructive sleep apnea (adult) (pediatric): Secondary | ICD-10-CM | POA: Insufficient documentation

## 2024-05-14 DIAGNOSIS — Z7984 Long term (current) use of oral hypoglycemic drugs: Secondary | ICD-10-CM | POA: Insufficient documentation

## 2024-05-14 MED ORDER — TORSEMIDE 100 MG PO TABS: 100.0000 mg | ORAL_TABLET | Freq: Every day | ORAL | 3 refills | Status: AC

## 2024-05-14 NOTE — Progress Notes (Signed)
 ADVANCED HF CLINIC  NOTE  Referring Physician: Mazie Speed, MD Primary Care: Mazie Speed, MD Primary Cardiologist: Constancia Delton, MD  Chief complaint: HF  HPI:  Gloria Mercer is a 83 y.o. female with a hx of HFpEF, hypertension, obesity, CKD 3b.   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 HST 6/22 AHI 13.5 with sat down to 81%   Echocardiogram 7/24 EF 60-65% G1DD RV ok Personally reviewed  Remains on metolazone  2.5 on Tues and Saturdays. Had been on torsemide  100/50 but Nephrology stopped the evening dose as SCr bumped from 1.41-> 1.67. At we bumped back to 100/50 due to volume overload and prescribed Furoscix   Returns for f/u. Says she feels so much better since last winter. Renal (Dr. Zelda Hickman) in April and torsemide  cut back to 100 daily and metolazone  just once per week. Occasional edema. Gets around with walker and can to ADLs. Still struggling with hip and leg pain. Just started Ozempic  earlier this month. Weight down 16 pounds since the winter     Past Medical History:  Diagnosis Date   Allergic rhinitis    on allergy shots, sees Dr. Donnie Galea   Anemia    Arthritis    B12 deficiency    Back pain    Bilateral edema of lower extremity    Cataract    CHF (congestive heart failure) (HCC)    Constipation    Edema    Gallbladder problem    GERD (gastroesophageal reflux disease)    History of stomach ulcers    Hyperlipidemia    Hypertension    Joint pain    Macular degeneration    followed by Dr Lesta Rater at [facility]   Osteoarthritis    Other fatigue    Prediabetes    Proteinuria    RLS (restless legs syndrome)    Sciatica    Shortness of breath    Shortness of breath on exertion    Sleep apnea    Stage 3 chronic kidney disease (HCC)    Swallowing difficulty    Vitamin D  deficiency     Current Outpatient Medications   Medication Sig Dispense Refill   allopurinol (ZYLOPRIM) 100 MG tablet Take 100 mg by mouth daily.     azelastine  (ASTELIN ) 0.1 % nasal spray USE 2 SPRAYS IN EACH NOSTRIL DAILY 30 mL 11   bisacodyl  (DULCOLAX) 5 MG EC tablet Take 5 mg by mouth daily as needed for moderate constipation.     Cholecalciferol (VITAMIN D3) 5000 units CAPS Take 1 capsule by mouth daily.     clotrimazole -betamethasone  (LOTRISONE ) cream APPLY EXTERNALLY TO THE AFFECTED AREA TWICE DAILY AS NEEDED 60 g 0   esomeprazole (NEXIUM) 20 MG capsule Take 40 mg by mouth daily at 12 noon.      ezetimibe  (ZETIA ) 10 MG tablet TAKE 1 TABLET(10 MG) BY MOUTH DAILY 90 tablet 2   Fluticasone-Umeclidin-Vilant (TRELEGY ELLIPTA ) 100-62.5-25 MCG/ACT AEPB Inhale 1 puff into the lungs daily. 1 each 11   Furosemide  (FUROSCIX ) 80 MG/10ML CTKT Inject 1 Dose into the skin daily as needed (for swelling). Call the Heart Failure Clinic for further instruction before taking this medicine     ipratropium (ATROVENT ) 0.03 % nasal spray USE 2 SPRAYS IN EACH NOSTRIL EVERY 8 HOURS AS NEEDED 90 mL 1   JARDIANCE  10 MG TABS tablet TAKE 1 TABLET(10 MG) BY  MOUTH DAILY BEFORE BREAKFAST 30 tablet 6   levocetirizine (XYZAL) 5 MG tablet Take 2.5 mg by mouth every other day as needed for allergies.     metolazone  (ZAROXOLYN ) 2.5 MG tablet Take 2.5 mg by mouth once a week. Tuesday     montelukast  (SINGULAIR ) 10 MG tablet TAKE 1 TABLET(10 MG) BY MOUTH DAILYTAKE 1 TABLET(10 MG) BY MOUTH DAILY 90 tablet 3   Multiple Vitamins-Minerals (EYE VITAMINS PO) Take 2 tablets by mouth daily. Ocuvite 50+     ondansetron  (ZOFRAN ) 4 MG tablet Take 1 tablet (4 mg total) by mouth every 8 (eight) hours as needed for nausea or vomiting. 20 tablet 0   polyethylene glycol (MIRALAX  / GLYCOLAX ) 17 g packet Take 17 g by mouth daily as needed.     potassium chloride  SA (KLOR-CON  M) 20 MEQ tablet Take 2 tablets (40 mEq total) by mouth 2 (two) times daily. 360 tablet 2   pramipexole  (MIRAPEX ) 0.75  MG tablet TAKE 1 TABLET(0.75 MG) BY MOUTH AT BEDTIME 90 tablet 0   spironolactone  (ALDACTONE ) 25 MG tablet Take 1 tablet (25 mg total) by mouth daily. 90 tablet 3   torsemide  (DEMADEX ) 100 MG tablet Take 1.5 tablets (150 mg total) by mouth daily. (Patient taking differently: Take 100 mg by mouth daily. 1 tablet (100 mg) each morning and 1/2 tablet (50 mg) at bedtime) 135 tablet 3   triamcinolone  (NASACORT ) 55 MCG/ACT AERO nasal inhaler Place 2 sprays into the nose daily.     triamcinolone  ointment (KENALOG ) 0.5 % Apply 1 Application topically 2 (two) times daily as needed.     No current facility-administered medications for this visit.    Allergies  Allergen Reactions   Erythromycin Other (See Comments)    Other reaction(s): Headache Headache and GI upset Headache and GI upset   Losartan Nausea And Vomiting   Metoprolol Nausea And Vomiting   Simvastatin Other (See Comments)    Other reaction(s): Muscle Pain Pain lower extremities Pain lower extremities   Dexlansoprazole Diarrhea   Lisinopril Cough   Prednisone      Other reaction(s): Constipation GI upset      Social History   Socioeconomic History   Marital status: Divorced    Spouse name: Not on file   Number of children: 1   Years of education: Not on file   Highest education level: Bachelor's degree (e.g., BA, AB, BS)  Occupational History   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, theatre/television/film  Tobacco Use   Smoking status: Former    Current packs/day: 0.00    Average packs/day: 1 pack/day for 11.0 years (11.0 ttl pk-yrs)    Types: Cigarettes    Start date: 11/29/1965    Quit date: 11/28/1976    Years since quitting: 47.4   Smokeless tobacco: Never  Vaping Use   Vaping status: Never Used  Substance and Sexual Activity   Alcohol use: Not Currently    Comment: rarely, at holidays   Drug use: No   Sexual activity: Not Currently  Other Topics Concern   Not on file  Social History Narrative   Not  on file   Social Drivers of Health   Financial Resource Strain: Low Risk  (12/27/2022)   Overall Financial Resource Strain (CARDIA)    Difficulty of Paying Living Expenses: Not hard at all  Food Insecurity: No Food Insecurity (12/21/2022)   Hunger Vital Sign    Worried About Running Out of Food in the Last Year: Never  true    Ran Out of Food in the Last Year: Never true  Transportation Needs: No Transportation Needs (12/27/2022)   PRAPARE - Administrator, Civil Service (Medical): No    Lack of Transportation (Non-Medical): No  Physical Activity: Inactive (02/24/2022)   Exercise Vital Sign    Days of Exercise per Week: 0 days    Minutes of Exercise per Session: 0 min  Stress: No Stress Concern Present (02/24/2022)   [employer] of Occupational Health - Occupational Stress Questionnaire    Feeling of Stress : Not at all  Social Connections: Unknown (02/24/2022)   Social Connection and Isolation Panel    Frequency of Communication with Friends and Family: More than three times a week    Frequency of Social Gatherings with Friends and Family: Once a week    Attends Religious Services: Never    Database administrator or Organizations: No    Attends Banker Meetings: Never    Marital Status: Not on file  Intimate Partner Violence: Not At Risk (02/24/2022)   Humiliation, Afraid, Rape, and Kick questionnaire    Fear of Current or Ex-Partner: No    Emotionally Abused: No    Physically Abused: No    Sexually Abused: No      Family History  Problem Relation Age of Onset   Hyperlipidemia Mother    Hypertension Mother    Seizures Mother    Hypertension Father    Heart disease Father 57       several heart attacks   Diabetes Father        diet controlled   Myelodysplastic syndrome Sister    Prostate cancer Paternal Grandfather    Breast cancer Neg Hx    Cervical cancer Neg Hx     Vitals:   05/14/24 1347  BP: (!) 109/49  Pulse: 85  SpO2: 99%   Weight: 216 lb 12.8 oz (98.3 kg)      Wt Readings from Last 3 Encounters:  05/14/24 216 lb 12.8 oz (98.3 kg)  01/23/24 220 lb (99.8 kg)  11/29/23 232 lb 6.4 oz (105.4 kg)    PHYSICAL EXAM: General:  Obese woman sitting in chair  No resp difficulty HEENT: normal Neck: supple. JVP 7. Carotids 2+ bilat; no bruits. No lymphadenopathy or thryomegaly appreciated. Cor:  Regular rate & rhythm. No rubs, gallops or murmurs. Lungs: clear Abdomen: obese soft, nontender, nondistended. No hepatosplenomegaly. No bruits or masses.d 1+ L edema Neuro: alert & orientedx3, cranial nerves grossly intact. moves all 4 extremities w/o difficulty. Affect pleasant  ASSESSMENT & PLAN:  1. Chronic diastolic HF/lower extremity edema - suspect combination of diastolic HF and venous insufficiency - Echocardiogram 03/18/2021 EF 55-60% G1DD RV ok \ - Echocardiogram 7/24 EF 60-65% G1DD RV ok Personally reviewed - Improved NYHA II-III - Volume status looks prety good. Still with some persistent ankle edema. Would take extra dose of metolazone  as needed - Continue torsemide  100 (recently decreased from 100/50)  - Continue spiro 25 daily - Continue metolazone  2.5 every Tuesday - Continue Jardiance  10  - Continue Ozempic  - Unable to use compression stockings -> failed using ACE wraps  2. OSA - Mild to moderate AHI 13.5 - refuses CPAP  3. Morbid obesity - Recently started Ozempic   4. CKD  3b - follows with Dr. Zelda Hickman at [facility] - Continue Jardiance  & GLP1 - Management of diuretics as above - Last Scr stabe at 1.57 on 04/05/24 - Will repeat today  5. DM2 - Continue Jardiance  and Ozempic    Jules Oar, MD  2:03 PM

## 2024-05-14 NOTE — Addendum Note (Signed)
 Addended by: Autry Legions A on: 05/14/2024 02:18 PM   Modules accepted: Orders

## 2024-05-14 NOTE — Patient Instructions (Signed)
 Medication Changes:  No medication changes today!  REFILLED Torsemide   Lab Work:  Go DOWN to LOWER LEVEL (LL) to have your blood work completed inside of [employer] office.  We will only call you if the results are abnormal or if the provider would like to make medication changes.   Follow-Up in: Please follow up with the [facility] in 4 months with Dr. Julane Ny. We do not currently have that schedule. Please give us  a call in September in order to schedule your appointment for October.   At the [facility], you and your health needs are our priority. We have a designated team specialized in the treatment of Heart Failure. This Care Team includes your primary Heart Failure Specialized Cardiologist (physician), Advanced Practice Providers (APPs- Physician Assistants and Nurse Practitioners), and Pharmacist who all work together to provide you with the care you need, when you need it.   You may see any of the following providers on your designated Care Team at your next follow up:  Dr. Jules Oar Dr. Peder Bourdon Dr. Alwin Baars Dr. Judyth Nunnery Shawnee Dellen, FNP Bevely Brush, RPH-CPP  Please be sure to bring in all your medications bottles to every appointment.   Need to Contact Us :  If you have any questions or concerns before your next appointment please send us  a message through [place] or call our office at [phone].    TO LEAVE A MESSAGE FOR THE NURSE SELECT OPTION 2, PLEASE LEAVE A MESSAGE INCLUDING: YOUR NAME DATE OF BIRTH CALL BACK NUMBER REASON FOR CALL**this is important as we prioritize the call backs  YOU WILL RECEIVE A CALL BACK THE SAME DAY AS LONG AS YOU CALL BEFORE 4:00 PM

## 2024-05-15 LAB — BASIC METABOLIC PANEL WITH GFR
BUN/Creatinine Ratio: 23 (ref 12–28)
BUN: 31 mg/dL — ABNORMAL HIGH (ref 8–27)
CO2: 20 mmol/L (ref 20–29)
Calcium: 10.1 mg/dL (ref 8.7–10.3)
Chloride: 99 mmol/L (ref 96–106)
Creatinine, Ser: 1.34 mg/dL — ABNORMAL HIGH (ref 0.57–1.00)
Glucose: 111 mg/dL — ABNORMAL HIGH (ref 70–99)
Potassium: 4.6 mmol/L (ref 3.5–5.2)
Sodium: 138 mmol/L (ref 134–144)
eGFR: 39 mL/min/{1.73_m2} — ABNORMAL LOW (ref >59)

## 2024-05-15 LAB — BRAIN NATRIURETIC PEPTIDE: BNP: 24.9 pg/mL (ref 0.0–100.0)

## 2024-05-22 ENCOUNTER — Other Ambulatory Visit: Payer: Self-pay | Admitting: Family Medicine

## 2024-05-22 DIAGNOSIS — I5032 Chronic diastolic (congestive) heart failure: Secondary | ICD-10-CM

## 2024-05-23 NOTE — Telephone Encounter (Signed)
 Copied from CRM [id number]. Topic: General - Other >> May 23, 2024 11:52 AM Treva T wrote: Reason for CRM: Patient returning call to office, states voicemail left from [place]. Per note in chart, Contacted patient to verify if refill is needed as it was marked not taking on 05/14/24.  Medication: Semaglutide  2mg /79ml  Per patient states she had previously stopped taking medication as she had missed some doses, however she is now taking again, and last shot was taken last night, 05/22/24.  Patient is confirming that a refill is needed as previously requested.   Patient is requesting a return call as she has further questions she would like to ask regarding medication.  Patient can be reached at [phone].

## 2024-05-23 NOTE — Telephone Encounter (Signed)
 Contacted patient to verify if refill is needed as it was marked not taking on 05/14/24 Ok to clarify if call is returned

## 2024-05-24 ENCOUNTER — Telehealth: Payer: Self-pay | Admitting: Family Medicine

## 2024-05-24 NOTE — Telephone Encounter (Signed)
 This can wait until PCP returns to office

## 2024-05-24 NOTE — Telephone Encounter (Signed)
 Next dose would be due 05/29/24, would recommend patient discuss restarting prescription with her PCP upon return to office

## 2024-05-28 ENCOUNTER — Ambulatory Visit: Admitting: Cardiology

## 2024-05-28 NOTE — Telephone Encounter (Signed)
 There was an separated encounter, I wasn't aware of but you have been cc'd

## 2024-05-28 NOTE — Telephone Encounter (Signed)
 We need to confirm which dose she is actually taking.  There were several different doses in the chart from previous and I am going to fill it, but want to fill the correct dose.

## 2024-05-31 MED ORDER — OZEMPIC (0.25 OR 0.5 MG/DOSE) 2 MG/3ML ~~LOC~~ SOPN: 0.5000 mg | PEN_INJECTOR | SUBCUTANEOUS | 2 refills | Status: DC

## 2024-05-31 NOTE — Addendum Note (Signed)
 Addended by: Magaly Pollina M on: 05/31/2024 01:32 PM   Modules accepted: Orders

## 2024-06-11 ENCOUNTER — Telehealth: Payer: Self-pay

## 2024-06-11 NOTE — Telephone Encounter (Signed)
 Patient is having continued constipation to the point she would like to switch to another product.  She states a family member had similar situation and did better on Tirzepatide .  Would like to switch if able.     Copied from CRM [id number]. Topic: Clinical - Medication Question >> Jun 11, 2024 10:42 AM Tobias CROME wrote: Reason for CRM: Patient would like to talk to provider about changing ozempic  to a different prescription.   Please reach out to patient to discuss, [phone]

## 2024-06-13 NOTE — Telephone Encounter (Signed)
 Ok to d/c Ozempic  and Rx Mounjaro  2.5mg  weekly x4 weeks and then 5mg  weekly (send 2 separate Rxs).  For 5mg  dose, send 1 month supply with 2 refills.  We may have to do a PA

## 2024-06-14 ENCOUNTER — Other Ambulatory Visit (HOSPITAL_COMMUNITY): Payer: Self-pay

## 2024-06-14 MED ORDER — TIRZEPATIDE 2.5 MG/0.5ML ~~LOC~~ SOAJ: 2.5000 mg | SUBCUTANEOUS | 0 refills | Status: AC

## 2024-06-14 MED ORDER — MOUNJARO 5 MG/0.5ML ~~LOC~~ SOAJ: 5.0000 mg | SUBCUTANEOUS | 2 refills | Status: DC

## 2024-06-14 NOTE — Addendum Note (Signed)
 Addended by: Burch Marchuk E on: 06/14/2024 11:29 AM   Modules accepted: Orders

## 2024-06-15 ENCOUNTER — Other Ambulatory Visit (HOSPITAL_COMMUNITY): Payer: Self-pay

## 2024-06-19 ENCOUNTER — Telehealth: Payer: Self-pay | Admitting: Pharmacy Technician

## 2024-06-19 ENCOUNTER — Other Ambulatory Visit (HOSPITAL_COMMUNITY): Payer: Self-pay

## 2024-06-19 ENCOUNTER — Telehealth: Payer: Self-pay | Admitting: Family Medicine

## 2024-06-19 NOTE — Telephone Encounter (Signed)
 Pharmacy Patient Advocate Encounter   Received notification from Onbase that prior authorization for MOUNJARO  2.5MG /0.5ML AUTO-INJECTORS is required/requested.   Insurance verification completed.   The patient is insured through [facility] MEDICARE.   Per test claim: PA required; PA submitted to above mentioned insurance via LATENT Key/confirmation #/EOC [id number] Status is pending

## 2024-06-19 NOTE — Telephone Encounter (Unsigned)
 Copied from CRM [id number]. Topic: Clinical - Medication Prior Auth >> Jun 19, 2024  4:31 PM Donee H wrote: Reason for CRM:  Erminio from [facility] called to state patient's medication for tirzepatide  (MOUNJARO ) 2.5 MG/0.5ML Pen has been approved. Approval is effective as of today. Authorization number [id number]  She has stated will fax over approval letter as well. If there are any question, callback number is [phone] option 5

## 2024-06-20 ENCOUNTER — Other Ambulatory Visit (HOSPITAL_COMMUNITY): Payer: Self-pay

## 2024-06-20 NOTE — Telephone Encounter (Signed)
 Pharmacy Patient Advocate Encounter  Received notification from [facility] MEDICARE  that Prior Authorization for MOUNJARO  2.5MG /0.5ML AUTO-INJECTORS  has been APPROVED from 06/19/2024 to 06/19/2025. Ran test claim, Copay is $0.00. This test claim was processed through [facility]- copay amounts may vary at other pharmacies due to pharmacy/plan contracts, or as the patient moves through the different stages of their insurance plan.   PA #/Case ID/Reference #: [id number]

## 2024-06-28 DIAGNOSIS — I129 Hypertensive chronic kidney disease with stage 1 through stage 4 chronic kidney disease, or unspecified chronic kidney disease: Secondary | ICD-10-CM | POA: Diagnosis not present

## 2024-06-28 DIAGNOSIS — E876 Hypokalemia: Secondary | ICD-10-CM | POA: Diagnosis not present

## 2024-06-28 DIAGNOSIS — E790 Hyperuricemia without signs of inflammatory arthritis and tophaceous disease: Secondary | ICD-10-CM | POA: Diagnosis not present

## 2024-06-28 DIAGNOSIS — N2581 Secondary hyperparathyroidism of renal origin: Secondary | ICD-10-CM | POA: Diagnosis not present

## 2024-06-28 DIAGNOSIS — N1832 Chronic kidney disease, stage 3b: Secondary | ICD-10-CM | POA: Diagnosis not present

## 2024-06-29 ENCOUNTER — Other Ambulatory Visit: Payer: Self-pay | Admitting: Cardiology

## 2024-07-18 ENCOUNTER — Ambulatory Visit: Payer: Medicare Other

## 2024-07-18 DIAGNOSIS — Z0001 Encounter for general adult medical examination with abnormal findings: Secondary | ICD-10-CM | POA: Diagnosis not present

## 2024-07-18 DIAGNOSIS — Z0000 Encounter for general adult medical examination without abnormal findings: Secondary | ICD-10-CM

## 2024-07-18 DIAGNOSIS — E119 Type 2 diabetes mellitus without complications: Secondary | ICD-10-CM

## 2024-07-18 NOTE — Patient Instructions (Addendum)
 Ms. Granada , Thank you for taking time out of your busy schedule to complete your Annual Wellness Visit with me. I enjoyed our conversation and look forward to speaking with you again next year. I, as well as your care team,  appreciate your ongoing commitment to your health goals. Please review the following plan we discussed and let me know if I can assist you in the future.  REFERRAL SENT FOR DIABETIC EDUCATION CLASS  Follow up Visits: 07/24/25 @ 10:10 AM BY PHONE We will see or speak with you next year for your Next Medicare AWV with our clinical staff Have you seen your provider in the last 6 months (3 months if uncontrolled diabetes)? Yes  Clinician Recommendations:  Aim for 30 minutes of exercise or brisk walking, 6-8 glasses of water, and 5 servings of fruits and vegetables each day. TAKE CARE!      This is a list of the screenings recommended for you:  Health Maintenance  Topic Date Due   Eye exam for diabetics  Never done   DTaP/Tdap/Td vaccine (2 - Td or Tdap) 11/08/2018   COVID-19 Vaccine (6 - 2024-25 season) 07/31/2023   Yearly kidney health urinalysis for diabetes  05/23/2024   Complete foot exam   05/23/2024   Flu Shot  06/29/2024   Hemoglobin A1C  07/22/2024   Yearly kidney function blood test for diabetes  05/14/2025   Medicare Annual Wellness Visit  07/18/2025   Pneumococcal Vaccine for age over 53  Completed   DEXA scan (bone density measurement)  Completed   Zoster (Shingles) Vaccine  Completed   HPV Vaccine  Aged Out   Meningitis B Vaccine  Aged Out    Advanced directives: (In Chart) A copy of your advanced directives are scanned into your chart should your provider ever need it. Advance Care Planning is important because it:  [x]  Makes sure you receive the medical care that is consistent with your values, goals, and preferences  [x]  It provides guidance to your family and loved ones and reduces their decisional burden about whether or not they are making the  right decisions based on your wishes.  Follow the link provided in your after visit summary or read over the paperwork we have mailed to you to help you started getting your Advance Directives in place. If you need assistance in completing these, please reach out to us  so that we can help you!

## 2024-07-18 NOTE — Progress Notes (Signed)
 Subjective:   Gloria Mercer is a 83 y.o. who presents for a Medicare Wellness preventive visit.  As a reminder, Annual Wellness Visits don't include a physical exam, and some assessments may be limited, especially if this visit is performed virtually. We may recommend an in-person follow-up visit with your provider if needed.  Visit Complete: Virtual I connected with  [name] on 07/18/24 by a audio enabled telemedicine application and verified that I am speaking with the correct person using two identifiers.  Patient Location: Home  Provider Location: Home Office  I discussed the limitations of evaluation and management by telemedicine. The patient expressed understanding and agreed to proceed.  Vital Signs: Because this visit was a virtual/telehealth visit, some criteria may be missing or patient reported. Any vitals not documented were not able to be obtained and vitals that have been documented are patient reported.  VideoDeclined- This patient declined Librarian, academic. Therefore the visit was completed with audio only.  Persons Participating in Visit: Patient.  AWV Questionnaire: No: Patient Medicare AWV questionnaire was not completed prior to this visit.  Cardiac Risk Factors include: advanced age (>60men, >43 women);diabetes mellitus;dyslipidemia;hypertension;obesity (BMI >30kg/m2);sedentary lifestyle     Objective:    Today's Vitals   07/18/24 1013  PainSc: 4    There is no height or weight on file to calculate BMI.     07/18/2024   10:22 AM 12/16/2022   10:11 AM 08/12/2022    9:25 AM 03/18/2022    8:55 AM 02/24/2022    2:40 PM 09/16/2020    1:35 PM 06/24/2020   12:51 PM  Advanced Directives  Does Patient Have a Medical Advance Directive? Yes No Yes No Yes Yes No  Type of Estate agent of [place];Living will    Healthcare Power of [place];Living will Healthcare Power of [place];Living will   Does  patient want to make changes to medical advance directive? No - Patient declined    Yes (Inpatient - patient defers changing a medical advance directive and declines information at this time)    Copy of Healthcare Power of Attorney in Chart? Yes - validated most recent copy scanned in chart (See row information)    Yes - validated most recent copy scanned in chart (See row information) Yes - validated most recent copy scanned in chart (See row information)   Would patient like information on creating a medical advance directive?  No - Patient declined  No - Patient declined       Current Medications (verified) Outpatient Encounter Medications as of 07/18/2024  Medication Sig   allopurinol (ZYLOPRIM) 100 MG tablet Take 100 mg by mouth daily.   azelastine  (ASTELIN ) 0.1 % nasal spray USE 2 SPRAYS IN EACH NOSTRIL DAILY   bisacodyl  (DULCOLAX) 5 MG EC tablet Take 5 mg by mouth daily as needed for moderate constipation.   Cholecalciferol (VITAMIN D3) 5000 units CAPS Take 1 capsule by mouth daily.   clotrimazole -betamethasone  (LOTRISONE ) cream APPLY EXTERNALLY TO THE AFFECTED AREA TWICE DAILY AS NEEDED   esomeprazole (NEXIUM) 20 MG capsule Take 40 mg by mouth daily at 12 noon.    ezetimibe  (ZETIA ) 10 MG tablet TAKE 1 TABLET(10 MG) BY MOUTH DAILY   Fluticasone-Umeclidin-Vilant (TRELEGY ELLIPTA ) 100-62.5-25 MCG/ACT AEPB Inhale 1 puff into the lungs daily.   Furosemide  (FUROSCIX ) 80 MG/10ML CTKT Inject 1 Dose into the skin daily as needed (for swelling). Call the [facility] Failure Clinic for further instruction before taking this medicine  ipratropium (ATROVENT ) 0.03 % nasal spray USE 2 SPRAYS IN EACH NOSTRIL EVERY 8 HOURS AS NEEDED   JARDIANCE  10 MG TABS tablet TAKE 1 TABLET(10 MG) BY MOUTH DAILY BEFORE BREAKFAST   metolazone  (ZAROXOLYN ) 2.5 MG tablet Take 2.5 mg by mouth once a week. Tuesday   montelukast  (SINGULAIR ) 10 MG tablet TAKE 1 TABLET(10 MG) BY MOUTH DAILYTAKE 1 TABLET(10 MG) BY MOUTH DAILY    Multiple Vitamins-Minerals (EYE VITAMINS PO) Take 2 tablets by mouth daily. Ocuvite 50+   ondansetron  (ZOFRAN ) 4 MG tablet Take 1 tablet (4 mg total) by mouth every 8 (eight) hours as needed for nausea or vomiting.   polyethylene glycol (MIRALAX  / GLYCOLAX ) 17 g packet Take 17 g by mouth daily as needed.   potassium chloride  SA (KLOR-CON  M) 20 MEQ tablet Take 2 tablets (40 mEq total) by mouth 2 (two) times daily.   pramipexole  (MIRAPEX ) 0.75 MG tablet TAKE 1 TABLET(0.75 MG) BY MOUTH AT BEDTIME   tirzepatide  (MOUNJARO ) 2.5 MG/0.5ML Pen Inject 2.5 mg into the skin once a week.   torsemide  (DEMADEX ) 100 MG tablet Take 1 tablet (100 mg total) by mouth daily.   triamcinolone  (NASACORT ) 55 MCG/ACT AERO nasal inhaler Place 2 sprays into the nose daily.   triamcinolone  ointment (KENALOG ) 0.5 % Apply 1 Application topically 2 (two) times daily as needed.   levocetirizine (XYZAL) 5 MG tablet Take 2.5 mg by mouth every other day as needed for allergies. (Patient not taking: Reported on 07/18/2024)   spironolactone  (ALDACTONE ) 25 MG tablet TAKE 1 TABLET(25 MG) BY MOUTH DAILY   tirzepatide  (MOUNJARO ) 5 MG/0.5ML Pen Inject 5 mg into the skin once a week.   No facility-administered encounter medications on file as of 07/18/2024.    Allergies (verified) Erythromycin, Losartan, Metoprolol, Simvastatin, Dexlansoprazole, Lisinopril, and Prednisone    History: Past Medical History:  Diagnosis Date   Allergic rhinitis    on allergy shots, sees Dr. Milissa   Anemia    Arthritis    B12 deficiency    Back pain    Bilateral edema of lower extremity    Cataract    CHF (congestive heart failure) (HCC)    Constipation    Edema    Gallbladder problem    GERD (gastroesophageal reflux disease)    History of stomach ulcers    Hyperlipidemia    Hypertension    Joint pain    Macular degeneration    [facility]ford [facility]   Osteoarthritis    Other fatigue    Prediabetes    Proteinuria    RLS (restless  legs syndrome)    Sciatica    Shortness of breath    Shortness of breath on exertion    Sleep apnea    Stage 3 chronic kidney disease (HCC)    Swallowing difficulty    Vitamin D  deficiency    Past Surgical History:  Procedure Laterality Date   CHOLECYSTECTOMY  2002   TUBAL LIGATION  1978   Family History  Problem Relation Age of Onset   Hyperlipidemia Mother    Hypertension Mother    Seizures Mother    Hypertension Father    Heart disease Father 11       several heart attacks   Diabetes Father        diet controlled   Myelodysplastic syndrome Sister    Prostate cancer Paternal Grandfather    Breast cancer Neg Hx    Cervical cancer Neg Hx    Social History  Socioeconomic History   Marital status: Divorced    Spouse name: Not on file   Number of children: 1   Years of education: Not on file   Highest education level: Bachelor's degree (e.g., BA, AB, BS)  Occupational History   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, theatre/television/film  Tobacco Use   Smoking status: Former    Current packs/day: 0.00    Average packs/day: 1 pack/day for 11.0 years (11.0 ttl pk-yrs)    Types: Cigarettes    Start date: 11/29/1965    Quit date: 11/28/1976    Years since quitting: 47.6   Smokeless tobacco: Never  Vaping Use   Vaping status: Never Used  Substance and Sexual Activity   Alcohol use: Not Currently    Comment: rarely, at holidays   Drug use: No   Sexual activity: Not Currently  Other Topics Concern   Not on file  Social History Narrative   Not on file   Social Drivers of Health   Financial Resource Strain: Low Risk  (07/18/2024)   Overall Financial Resource Strain (CARDIA)    Difficulty of Paying Living Expenses: Not hard at all  Food Insecurity: No Food Insecurity (07/18/2024)   Hunger Vital Sign    Worried About Running Out of Food in the Last Year: Never true    Ran Out of Food in the Last Year: Never true  Transportation Needs: No Transportation Needs  (07/18/2024)   PRAPARE - Administrator, Civil Service (Medical): No    Lack of Transportation (Non-Medical): No  Physical Activity: Inactive (07/18/2024)   Exercise Vital Sign    Days of Exercise per Week: 0 days    Minutes of Exercise per Session: 0 min  Stress: No Stress Concern Present (07/18/2024)   [employer] of Occupational Health - Occupational Stress Questionnaire    Feeling of Stress: Not at all  Social Connections: Socially Isolated (07/18/2024)   Social Connection and Isolation Panel    Frequency of Communication with Friends and Family: More than three times a week    Frequency of Social Gatherings with Friends and Family: Not on file    Attends Religious Services: Never    Database administrator or Organizations: No    Attends Engineer, structural: Never    Marital Status: Divorced    Tobacco Counseling Counseling given: Not Answered    Clinical Intake:  Pre-visit preparation completed: Yes  Pain : 0-10 Pain Score: 4  Pain Type: Chronic pain Pain Location: Knee Pain Orientation: Right, Left Pain Descriptors / Indicators: Aching, Discomfort, Constant Pain Onset: More than a month ago Pain Frequency: Constant     BMI - recorded: 40.8 Nutritional Status: BMI > 30  Obese Nutritional Risks: None Diabetes: Yes CBG done?: No Did pt. bring in CBG monitor from home?: No  Lab Results  Component Value Date   HGBA1C 6.9 (H) 01/23/2024   HGBA1C 6.6 (H) 07/01/2023   HGBA1C 6.5 (H) 04/19/2023     How often do you need to have someone help you when you read instructions, pamphlets, or other written materials from your doctor or pharmacy?: 1 - Never  Interpreter Needed?: No  Information entered by :: JHONNIE DAS, LPN   Activities of Daily Living    07/18/2024   10:23 AM  In your present state of health, do you have any difficulty performing the following activities:  Hearing? 0  Vision? 0  Difficulty concentrating or making  decisions? 0  Walking or climbing stairs? 1  Comment KNEE PAIN  Dressing or bathing? 0  Doing errands, shopping? 1  Preparing Food and eating ? N  Using the Toilet? N  In the past six months, have you accidently leaked urine? N  Do you have problems with loss of bowel control? N  Managing your Medications? N  Managing your Finances? N  Housekeeping or managing your Housekeeping? Y    Patient Care Team: Myrla Jon HERO, MD as PCP - General (Family Medicine) Darliss Rogue, MD as PCP - Cardiology (Cardiology) Mettu, Virgina Apo, MD as Referring Physician (Ophthalmology)  I have updated your Care Teams any recent Medical Services you may have received from other providers in the past year.     Assessment:   This is a routine wellness examination for Garey.  Hearing/Vision screen Hearing Screening - Comments:: NO AIDS Vision Screening - Comments:: WEARS GLASSES ALL DAY- [facility]- HAS MACULAR DEGENERATION- GOES EVERY 6 MONTHS   Goals Addressed             This Visit's Progress    DIET - INCREASE WATER INTAKE         Depression Screen     07/18/2024   10:20 AM 07/11/2023    3:01 PM 05/24/2023    3:19 PM 04/19/2023    3:02 PM 12/30/2022    4:10 PM 09/07/2022   11:07 AM 03/02/2022    8:48 AM  PHQ 2/9 Scores  PHQ - 2 Score 0 4 2 4  0 0 3  PHQ- 9 Score 0 11 12 11 9 2 9     Fall Risk     07/18/2024   10:23 AM 07/11/2023    3:01 PM 04/19/2023    3:02 PM 12/30/2022    4:10 PM 09/07/2022   11:07 AM  Fall Risk   Falls in the past year? 0 1 1 1 1   Number falls in past yr: 0 0 0 1 0  Injury with Fall? 0 1 1 1 1   Risk for fall due to : No Fall Risks No Fall Risks History of fall(s) History of fall(s);Impaired balance/gait;Impaired mobility History of fall(s)  Follow up Falls evaluation completed;Falls prevention discussed Falls evaluation completed Falls evaluation completed;Education provided  Falls evaluation completed;Education provided;Falls prevention  discussed      Data saved with a previous flowsheet row definition    MEDICARE RISK AT HOME:  Medicare Risk at Home Any stairs in or around the home?: No If so, are there any without handrails?: No Home free of loose throw rugs in walkways, pet beds, electrical cords, etc?: Yes Adequate lighting in your home to reduce risk of falls?: Yes Life alert?: Yes Use of a cane, walker or w/c?: Yes (WALKER ALL THE TIME) Grab bars in the bathroom?: Yes Shower chair or bench in shower?: No Elevated toilet seat or a handicapped toilet?: Yes  TIMED UP AND GO:  Was the test performed?  No  Cognitive Function: 6CIT completed        07/18/2024   10:25 AM 09/16/2020    1:38 PM  6CIT Screen  What Year? 0 points 0 points  What month? 0 points 0 points  What time? 3 points 0 points  Count back from 20 0 points 0 points  Months in reverse 0 points 0 points  Repeat phrase 0 points 0 points  Total Score 3 points 0 points    Immunizations Immunization History  Administered Date(s) Administered  Fluad Quad(high Dose 65+) 12/05/2019, 09/29/2020, 10/02/2021, 09/07/2022   Fluad Trivalent(High Dose 65+) 09/01/2023   Influenza Split 10/27/2010   Influenza, High Dose Seasonal PF 10/10/2013, 11/06/2014, 09/22/2015, 08/29/2017, 09/27/2018   Influenza, Seasonal, Injecte, Preservative Fre 08/31/2015   Influenza,trivalent, recombinat, inj, PF 11/07/2012   Influenza-Unspecified 11/07/2012, 10/10/2013, 08/31/2015, 08/31/2015, 09/17/2016, 09/27/2016   PFIZER Comirnaty(Gray Top)Covid-19 Tri-Sucrose Vaccine 12/25/2019, 01/15/2020, 09/01/2020   PFIZER(Purple Top)SARS-COV-2 Vaccination 12/25/2019, 01/15/2020   Pneumococcal Conjugate-13 06/25/2014   Pneumococcal Polysaccharide-23 11/30/2007   Pneumococcal-Unspecified 05/31/2015   Tdap 11/08/2008   Zoster Recombinant(Shingrix) 07/05/2017, 02/05/2018   Zoster, Live 05/24/2014    Screening Tests Health Maintenance  Topic Date Due   OPHTHALMOLOGY EXAM   Never done   DTaP/Tdap/Td (2 - Td or Tdap) 11/08/2018   COVID-19 Vaccine (6 - 2024-25 season) 07/31/2023   Diabetic kidney evaluation - Urine ACR  05/23/2024   FOOT EXAM  05/23/2024   INFLUENZA VACCINE  06/29/2024   HEMOGLOBIN A1C  07/22/2024   Diabetic kidney evaluation - eGFR measurement  05/14/2025   Medicare Annual Wellness (AWV)  07/18/2025   Pneumococcal Vaccine: 50+ Years  Completed   DEXA SCAN  Completed   Zoster Vaccines- Shingrix  Completed   HPV VACCINES  Aged Out   Meningococcal B Vaccine  Aged Out    Health Maintenance  Health Maintenance Due  Topic Date Due   OPHTHALMOLOGY EXAM  Never done   DTaP/Tdap/Td (2 - Td or Tdap) 11/08/2018   COVID-19 Vaccine (6 - 2024-25 season) 07/31/2023   Diabetic kidney evaluation - Urine ACR  05/23/2024   FOOT EXAM  05/23/2024   INFLUENZA VACCINE  06/29/2024   Health Maintenance Items Addressed: AGED OUT OF MAMMOGRAM & COLONOSCOPY;DECLINED BDS REFERRAL; NEEDS TDAP, PNA & COVID  Additional Screening:  Vision Screening: Recommended annual ophthalmology exams for early detection of glaucoma and other disorders of the eye. Would you like a referral to an eye doctor? No    Dental Screening: Recommended annual dental exams for proper oral hygiene  Community Resource Referral / Chronic Care Management: CRR required this visit?  No   CCM required this visit?  No   Plan:    I have personally reviewed and noted the following in the patient's chart:   Medical and social history Use of alcohol, tobacco or illicit drugs  Current medications and supplements including opioid prescriptions. Patient is not currently taking opioid prescriptions. Functional ability and status Nutritional status Physical activity Advanced directives List of other physicians Hospitalizations, surgeries, and ER visits in previous 12 months Vitals Screenings to include cognitive, depression, and falls Referrals and appointments  In addition, I have  reviewed and discussed with patient certain preventive protocols, quality metrics, and best practice recommendations. A written personalized care plan for preventive services as well as general preventive health recommendations were provided to patient.   Jhonnie GORMAN Das, LPN   [id number]/[id number]   After Visit Summary: (MyChart) Due to this being a telephonic visit, the after visit summary with patients personalized plan was offered to patient via MyChart   Notes: REFERRAL SENT FOR DIABETIC EDUCATION

## 2024-07-24 ENCOUNTER — Ambulatory Visit: Attending: Cardiology | Admitting: Cardiology

## 2024-07-24 ENCOUNTER — Encounter: Payer: Self-pay | Admitting: Cardiology

## 2024-07-24 VITALS — BP 113/65 | HR 107 | Ht 61.0 in | Wt 215.2 lb

## 2024-07-24 DIAGNOSIS — I5032 Chronic diastolic (congestive) heart failure: Secondary | ICD-10-CM | POA: Diagnosis present

## 2024-07-24 DIAGNOSIS — I10 Essential (primary) hypertension: Secondary | ICD-10-CM | POA: Insufficient documentation

## 2024-07-24 NOTE — Progress Notes (Signed)
 Cardiology Office Note:    Date:  07/24/2024   ID:  DEAH OTTAWAY, DOB Apr 07, 1941, MRN [id number]  PCP:  Myrla Jon HERO, MD   [facility]  Cardiologist:  Redell Cave, MD  Advanced Practice Provider:  No care team member to display Electrophysiologist:  None       Referring MD: Myrla Jon HERO, MD   Chief Complaint  Patient presents with   Follow-up    Pt c/o swollen on her both legs.    History of Present Illness:    Gloria Mercer is a 83 y.o. female with a hx of HFpEF, hypertension, obesity, CKD presents for follow-up.   States having more swelling in her legs.  Watches her salt intake closely.  Compliant with medications and diuretics as prescribed.  Previously was on Ozempic  but this caused constipation and she stopped taking.  Currently taking Mounjaro  at the lowest dose and tolerating.  Prior notes Echocardiogram 03/18/2021 normal systolic function, impaired relaxation, EF 55 to 60%. Not tolerant to several BP meds including losartan, metoprolol, lisinopril. Intolerant to metolazone , developed significant constipation.   Past Medical History:  Diagnosis Date   Allergic rhinitis    on allergy shots, sees Dr. Milissa   Anemia    Arthritis    B12 deficiency    Back pain    Bilateral edema of lower extremity    Cataract    CHF (congestive heart failure) (HCC)    Constipation    Edema    Gallbladder problem    GERD (gastroesophageal reflux disease)    History of stomach ulcers    Hyperlipidemia    Hypertension    Joint pain    Macular degeneration    followed by Dr Rutherford at [facility]   Osteoarthritis    Other fatigue    Prediabetes    Proteinuria    RLS (restless legs syndrome)    Sciatica    Shortness of breath    Shortness of breath on exertion    Sleep apnea    Stage 3 chronic kidney disease (HCC)    Swallowing difficulty    Vitamin D  deficiency     Past Surgical History:  Procedure Laterality Date    CHOLECYSTECTOMY  2002   TUBAL LIGATION  1978    Current Medications: Current Meds  Medication Sig   allopurinol (ZYLOPRIM) 100 MG tablet Take 100 mg by mouth daily.   azelastine  (ASTELIN ) 0.1 % nasal spray USE 2 SPRAYS IN EACH NOSTRIL DAILY   bisacodyl  (DULCOLAX) 5 MG EC tablet Take 5 mg by mouth daily as needed for moderate constipation.   Cholecalciferol (VITAMIN D3) 5000 units CAPS Take 1 capsule by mouth daily.   clotrimazole -betamethasone  (LOTRISONE ) cream APPLY EXTERNALLY TO THE AFFECTED AREA TWICE DAILY AS NEEDED   esomeprazole (NEXIUM) 20 MG capsule Take 40 mg by mouth daily at 12 noon.    ezetimibe  (ZETIA ) 10 MG tablet TAKE 1 TABLET(10 MG) BY MOUTH DAILY   Fluticasone-Umeclidin-Vilant (TRELEGY ELLIPTA ) 100-62.5-25 MCG/ACT AEPB Inhale 1 puff into the lungs daily.   Furosemide  (FUROSCIX ) 80 MG/10ML CTKT Inject 1 Dose into the skin daily as needed (for swelling). Call the Heart Failure Clinic for further instruction before taking this medicine   ipratropium (ATROVENT ) 0.03 % nasal spray USE 2 SPRAYS IN EACH NOSTRIL EVERY 8 HOURS AS NEEDED   JARDIANCE  10 MG TABS tablet TAKE 1 TABLET(10 MG) BY MOUTH DAILY BEFORE BREAKFAST   levocetirizine (XYZAL) 5 MG tablet Take 2.5 mg  by mouth every other day as needed for allergies.   metolazone  (ZAROXOLYN ) 2.5 MG tablet Take 2.5 mg by mouth 2 (two) times a week.   montelukast  (SINGULAIR ) 10 MG tablet TAKE 1 TABLET(10 MG) BY MOUTH DAILYTAKE 1 TABLET(10 MG) BY MOUTH DAILY   Multiple Vitamins-Minerals (EYE VITAMINS PO) Take 2 tablets by mouth daily. Ocuvite 50+   ondansetron  (ZOFRAN ) 4 MG tablet Take 1 tablet (4 mg total) by mouth every 8 (eight) hours as needed for nausea or vomiting.   polyethylene glycol (MIRALAX  / GLYCOLAX ) 17 g packet Take 17 g by mouth daily as needed.   potassium chloride  SA (KLOR-CON  M) 20 MEQ tablet Take 2 tablets (40 mEq total) by mouth 2 (two) times daily.   pramipexole  (MIRAPEX ) 0.75 MG tablet TAKE 1 TABLET(0.75 MG) BY MOUTH  AT BEDTIME   spironolactone  (ALDACTONE ) 25 MG tablet TAKE 1 TABLET(25 MG) BY MOUTH DAILY   tirzepatide  (MOUNJARO ) 2.5 MG/0.5ML Pen Inject 2.5 mg into the skin once a week.   tirzepatide  (MOUNJARO ) 5 MG/0.5ML Pen Inject 5 mg into the skin once a week.   torsemide  (DEMADEX ) 100 MG tablet Take 1 tablet (100 mg total) by mouth daily.   triamcinolone  (NASACORT ) 55 MCG/ACT AERO nasal inhaler Place 2 sprays into the nose daily.   triamcinolone  ointment (KENALOG ) 0.5 % Apply 1 Application topically 2 (two) times daily as needed.     Allergies:   Erythromycin, Losartan, Metoprolol, Simvastatin, Dexlansoprazole, Lisinopril, and Prednisone    Social History   Socioeconomic History   Marital status: Divorced    Spouse name: Not on file   Number of children: 1   Years of education: Not on file   Highest education level: Bachelor's degree (e.g., BA, AB, BS)  Occupational History   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, theatre/television/film  Tobacco Use   Smoking status: Former    Current packs/day: 0.00    Average packs/day: 1 pack/day for 11.0 years (11.0 ttl pk-yrs)    Types: Cigarettes    Start date: 11/29/1965    Quit date: 11/28/1976    Years since quitting: 47.6   Smokeless tobacco: Never  Vaping Use   Vaping status: Never Used  Substance and Sexual Activity   Alcohol use: Not Currently    Comment: rarely, at holidays   Drug use: No   Sexual activity: Not Currently  Other Topics Concern   Not on file  Social History Narrative   Not on file   Social Drivers of Health   Financial Resource Strain: Low Risk  (07/18/2024)   Overall Financial Resource Strain (CARDIA)    Difficulty of Paying Living Expenses: Not hard at all  Food Insecurity: No Food Insecurity (07/18/2024)   Hunger Vital Sign    Worried About Running Out of Food in the Last Year: Never true    Ran Out of Food in the Last Year: Never true  Transportation Needs: No Transportation Needs (07/18/2024)   PRAPARE -  Administrator, Civil Service (Medical): No    Lack of Transportation (Non-Medical): No  Physical Activity: Inactive (07/18/2024)   Exercise Vital Sign    Days of Exercise per Week: 0 days    Minutes of Exercise per Session: 0 min  Stress: No Stress Concern Present (07/18/2024)   [employer] of Occupational Health - Occupational Stress Questionnaire    Feeling of Stress: Not at all  Social Connections: Socially Isolated (07/18/2024)   Social Connection and Isolation Panel  Frequency of Communication with Friends and Family: More than three times a week    Frequency of Social Gatherings with Friends and Family: Not on file    Attends Religious Services: Never    Database administrator or Organizations: No    Attends Engineer, structural: Never    Marital Status: Divorced     Family History: The patient's family history includes Diabetes in her father; Heart disease (age of onset: 73) in her father; Hyperlipidemia in her mother; Hypertension in her father and mother; Myelodysplastic syndrome in her sister; Prostate cancer in her paternal grandfather; Seizures in her mother. There is no history of Breast cancer or Cervical cancer.  ROS:   Please see the history of present illness.     All other systems reviewed and are negative.  EKGs/Labs/Other Studies Reviewed:    The following studies were reviewed today:   Recent Labs: 01/23/2024: ALT 13 05/14/2024: BNP 24.9; BUN 31; Creatinine, Ser 1.34; Potassium 4.6; Sodium 138  Recent Lipid Panel    Component Value Date/Time   CHOL 187 01/23/2024 1131   TRIG 130 01/23/2024 1131   HDL 66 01/23/2024 1131   CHOLHDL 2.8 01/23/2024 1131   LDLCALC 98 01/23/2024 1131     Risk Assessment/Calculations:      Physical Exam:    VS:  BP 113/65 (BP Location: Left Wrist, Patient Position: Sitting, Cuff Size: Normal)   Pulse (!) 107   Ht 5' 1 (1.549 m)   Wt 215 lb 3.2 oz (97.6 kg)   SpO2 99%   BMI 40.66 kg/m      Wt Readings from Last 3 Encounters:  07/24/24 215 lb 3.2 oz (97.6 kg)  05/14/24 216 lb 12.8 oz (98.3 kg)  01/23/24 220 lb (99.8 kg)     GEN:  Well nourished, well developed in no acute distress HEENT: Normal NECK: Unable to assess JVD due to body habitus CARDIAC: RRR, no murmurs, rubs, gallops RESPIRATORY: Decreased breath sounds at bases ABDOMEN: Soft, non-tender, non-distended MUSCULOSKELETAL:  2+ edema; right foot in Unna boot SKIN: Warm and dry NEUROLOGIC:  Alert and oriented x 3 PSYCHIATRIC:  Normal affect   ASSESSMENT:    1. Chronic diastolic heart failure (HCC)   2. Primary hypertension   3. Morbid obesity (HCC)    PLAN:    In order of problems listed above:  HFpEF, bilateral lower extremity edema.  Increase metolazone  to twice weekly, Tuesdays and Saturdays, continue torsemide  100 mg daily, Aldactone  25 mg daily, Jardiance  10 mg daily.  Check BMP in 10 days.  Appreciate input from heart failure service Hypertension, BP controlled/low normal. continue Aldactone  25 mg daily. Morbid obesity, low-calorie diet, weight loss .continue Mounjaro .  Follow-up in 6 weeks   Medication Adjustments/Labs and Tests Ordered: Current medicines are reviewed at length with the patient today.  Concerns regarding medicines are outlined above.  Orders Placed This Encounter  Procedures   Basic metabolic panel with GFR    No orders of the defined types were placed in this encounter.     Patient Instructions  Medication Instructions:  Your physician has recommended you make the following change in your medication:   INCREASE Metolazone  to twice a week.   *If you need a refill on your cardiac medications before your next appointment, please call your pharmacy*  Lab Work: BMP in 10 days here in our office. No appointment is needed. The lab is open daily 8-4 and closed daily for lunch between 1-2.  If you have labs (blood work) drawn today and your tests are completely normal,  you will receive your results only by: MyChart Message (if you have MyChart) OR A paper copy in the mail If you have any lab test that is abnormal or we need to change your treatment, we will call you to review the results.  Testing/Procedures: None  Follow-Up: At [facility], you and your health needs are our priority.  As part of our continuing mission to provide you with exceptional heart care, our providers are all part of one team.  This team includes your primary Cardiologist (physician) and Advanced Practice Providers or APPs (Physician Assistants and Nurse Practitioners) who all work together to provide you with the care you need, when you need it.  Your next appointment:   6 week(s)  Provider:   Redell Cave, MD     Signed, Redell Cave, MD  07/24/2024 3:10 PM    [facility]

## 2024-07-24 NOTE — Patient Instructions (Signed)
 Medication Instructions:  Your physician has recommended you make the following change in your medication:   INCREASE Metolazone  to twice a week.   *If you need a refill on your cardiac medications before your next appointment, please call your pharmacy*  Lab Work: BMP in 10 days here in our office. No appointment is needed. The lab is open daily 8-4 and closed daily for lunch between 1-2.   If you have labs (blood work) drawn today and your tests are completely normal, you will receive your results only by: MyChart Message (if you have MyChart) OR A paper copy in the mail If you have any lab test that is abnormal or we need to change your treatment, we will call you to review the results.  Testing/Procedures: None  Follow-Up: At [facility], you and your health needs are our priority.  As part of our continuing mission to provide you with exceptional heart care, our providers are all part of one team.  This team includes your primary Cardiologist (physician) and Advanced Practice Providers or APPs (Physician Assistants and Nurse Practitioners) who all work together to provide you with the care you need, when you need it.  Your next appointment:   6 week(s)  Provider:   Redell Cave, MD

## 2024-07-26 ENCOUNTER — Encounter: Payer: Self-pay | Admitting: Family Medicine

## 2024-07-26 ENCOUNTER — Ambulatory Visit (INDEPENDENT_AMBULATORY_CARE_PROVIDER_SITE_OTHER): Payer: Medicare Other | Admitting: Family Medicine

## 2024-07-26 VITALS — BP 116/63 | HR 82 | Ht 61.0 in | Wt 211.0 lb

## 2024-07-26 DIAGNOSIS — I5032 Chronic diastolic (congestive) heart failure: Secondary | ICD-10-CM | POA: Diagnosis not present

## 2024-07-26 DIAGNOSIS — E1169 Type 2 diabetes mellitus with other specified complication: Secondary | ICD-10-CM

## 2024-07-26 DIAGNOSIS — E1122 Type 2 diabetes mellitus with diabetic chronic kidney disease: Secondary | ICD-10-CM

## 2024-07-26 DIAGNOSIS — I152 Hypertension secondary to endocrine disorders: Secondary | ICD-10-CM

## 2024-07-26 DIAGNOSIS — N1832 Chronic kidney disease, stage 3b: Secondary | ICD-10-CM | POA: Diagnosis not present

## 2024-07-26 DIAGNOSIS — E785 Hyperlipidemia, unspecified: Secondary | ICD-10-CM

## 2024-07-26 DIAGNOSIS — E1159 Type 2 diabetes mellitus with other circulatory complications: Secondary | ICD-10-CM

## 2024-07-26 DIAGNOSIS — D692 Other nonthrombocytopenic purpura: Secondary | ICD-10-CM | POA: Diagnosis not present

## 2024-07-26 DIAGNOSIS — G2581 Restless legs syndrome: Secondary | ICD-10-CM | POA: Diagnosis not present

## 2024-07-26 NOTE — Assessment & Plan Note (Signed)
 Chronic diastolic heart failure with significant peripheral edema. Current regimen includes metolazone , Aldactone , and Jardiance . Edema persists despite increased metolazone  dosing. No acute respiratory distress. - Continue metolazone  twice weekly as per cardiologist's advice - Continue Aldactone  25 mg - Advise contacting cardiologist if edema persists - Consider using furosemide  if edema does not improve - Check with cardiologist for follow-up appointment

## 2024-07-26 NOTE — Assessment & Plan Note (Signed)
 Currently managed with Zetia. - Continue Zetia

## 2024-07-26 NOTE — Assessment & Plan Note (Signed)
 Type 2 diabetes mellitus with chronic kidney disease. Possible diabetic peripheral neuropathy with intermittent numbness in the feet, particularly when sitting. Differential includes neuropathy due to diabetes or compressive factors. - Order A1c, cholesterol, kidney and liver function tests - Order urine test for diabetes - Continue Jardiance  10 mg for diabetes and heart failure - Continue Mounjaro  with transition to 5 mg dose after current supply - Follow up with eye doctor in one month - Check B12 levels to assess for deficiency - Advise position changes to alleviate foot numbness

## 2024-07-26 NOTE — Assessment & Plan Note (Signed)
Chronic and well controlled °Continue current meds °

## 2024-07-26 NOTE — Progress Notes (Signed)
 Established patient visit   Patient: Gloria Mercer   DOB: 1941-01-28   83 y.o. Female  MRN: [id number] Visit Date: 07/26/2024  Today's healthcare provider: Jon Eva, MD   Chief Complaint  Patient presents with   Medical Management of Chronic Issues    Symptoms: excessive thirst, excessive urinating, numbness/tingling in feet   Subjective    HPI HPI     Medical Management of Chronic Issues    Additional comments: Symptoms: excessive thirst, excessive urinating, numbness/tingling in feet      Last edited by Lilian Fitzpatrick, CMA on 07/26/2024  3:17 PM.       Discussed the use of AI scribe software for clinical note transcription with the patient, who gave verbal consent to proceed.  History of Present Illness   Gloria Mercer is an 83 year old female with hypertension and diabetes who presents for follow-up on blood pressure, blood sugar, and medication management.  She experiences significant swelling in her feet and legs, which has not improved despite increasing metolazone  to twice a week. Frequent urination occurs without relief in swelling. She is currently taking metolazone  twice a week, Aldactone  25 mg, and Jardiance  10 mg. She has two doses of furosemide  left and plans to use them if swelling persists.  She experiences a new sensation of numbness in her feet, particularly noticeable when sitting at night. It affects more than just the toes, and she sometimes wiggles her toes to alleviate it. No complete numbness in her feet.  She has started Mounjaro , taking two doses of 2.5 mg, and is confused about the dosing instructions. She feels hungry on Mounjaro , unlike her experience with Ozempic , which she previously used. No constipation with Mounjaro , unlike her experience with Ozempic .  She reports a recent breakout on her buttocks, which was sore and made sitting difficult. The breakout has improved, but she still finds it uncomfortable to sit  for long periods.  She experiences restless legs despite taking medication daily. She used to take B12 supplements but stopped after being advised they were unnecessary.         Medications: Outpatient Medications Prior to Visit  Medication Sig   allopurinol (ZYLOPRIM) 100 MG tablet Take 100 mg by mouth daily.   azelastine  (ASTELIN ) 0.1 % nasal spray USE 2 SPRAYS IN EACH NOSTRIL DAILY   bisacodyl  (DULCOLAX) 5 MG EC tablet Take 5 mg by mouth daily as needed for moderate constipation.   Cholecalciferol (VITAMIN D3) 5000 units CAPS Take 1 capsule by mouth daily.   clotrimazole -betamethasone  (LOTRISONE ) cream APPLY EXTERNALLY TO THE AFFECTED AREA TWICE DAILY AS NEEDED   esomeprazole (NEXIUM) 20 MG capsule Take 40 mg by mouth daily at 12 noon.    ezetimibe  (ZETIA ) 10 MG tablet TAKE 1 TABLET(10 MG) BY MOUTH DAILY   Fluticasone-Umeclidin-Vilant (TRELEGY ELLIPTA ) 100-62.5-25 MCG/ACT AEPB Inhale 1 puff into the lungs daily.   Furosemide  (FUROSCIX ) 80 MG/10ML CTKT Inject 1 Dose into the skin daily as needed (for swelling). Call the Heart Failure Clinic for further instruction before taking this medicine   ipratropium (ATROVENT ) 0.03 % nasal spray USE 2 SPRAYS IN EACH NOSTRIL EVERY 8 HOURS AS NEEDED   JARDIANCE  10 MG TABS tablet TAKE 1 TABLET(10 MG) BY MOUTH DAILY BEFORE BREAKFAST   levocetirizine (XYZAL) 5 MG tablet Take 2.5 mg by mouth every other day as needed for allergies.   metolazone  (ZAROXOLYN ) 2.5 MG tablet Take 2.5 mg by mouth 2 (two) times a week.  montelukast  (SINGULAIR ) 10 MG tablet TAKE 1 TABLET(10 MG) BY MOUTH DAILYTAKE 1 TABLET(10 MG) BY MOUTH DAILY   Multiple Vitamins-Minerals (EYE VITAMINS PO) Take 2 tablets by mouth daily. Ocuvite 50+   ondansetron  (ZOFRAN ) 4 MG tablet Take 1 tablet (4 mg total) by mouth every 8 (eight) hours as needed for nausea or vomiting.   polyethylene glycol (MIRALAX  / GLYCOLAX ) 17 g packet Take 17 g by mouth daily as needed.   potassium chloride  SA  (KLOR-CON  M) 20 MEQ tablet Take 2 tablets (40 mEq total) by mouth 2 (two) times daily.   pramipexole  (MIRAPEX ) 0.75 MG tablet TAKE 1 TABLET(0.75 MG) BY MOUTH AT BEDTIME   spironolactone  (ALDACTONE ) 25 MG tablet TAKE 1 TABLET(25 MG) BY MOUTH DAILY   tirzepatide  (MOUNJARO ) 2.5 MG/0.5ML Pen Inject 2.5 mg into the skin once a week.   tirzepatide  (MOUNJARO ) 5 MG/0.5ML Pen Inject 5 mg into the skin once a week.   torsemide  (DEMADEX ) 100 MG tablet Take 1 tablet (100 mg total) by mouth daily.   triamcinolone  (NASACORT ) 55 MCG/ACT AERO nasal inhaler Place 2 sprays into the nose daily.   triamcinolone  ointment (KENALOG ) 0.5 % Apply 1 Application topically 2 (two) times daily as needed.   No facility-administered medications prior to visit.    Review of Systems     Objective    BP 116/63 (BP Location: Left Arm, Patient Position: Sitting, Cuff Size: Normal)   Pulse 82   Ht 5' 1 (1.549 m)   Wt 211 lb (95.7 kg)   SpO2 97%   BMI 39.87 kg/m    Physical Exam Vitals reviewed.  Constitutional:      General: She is not in acute distress.    Appearance: Normal appearance. She is well-developed. She is not diaphoretic.  HENT:     Head: Normocephalic and atraumatic.  Eyes:     General: No scleral icterus.    Conjunctiva/sclera: Conjunctivae normal.  Neck:     Thyroid : No thyromegaly.  Cardiovascular:     Rate and Rhythm: Normal rate and regular rhythm.     Heart sounds: Normal heart sounds. No murmur heard. Pulmonary:     Effort: Pulmonary effort is normal. No respiratory distress.     Breath sounds: Normal breath sounds. No wheezing, rhonchi or rales.  Musculoskeletal:     Cervical back: Neck supple.     Right lower leg: Edema present.     Left lower leg: Edema present.  Lymphadenopathy:     Cervical: No cervical adenopathy.  Skin:    General: Skin is warm and dry.     Findings: No rash.  Neurological:     Mental Status: She is alert and oriented to person, place, and time. Mental  status is at baseline.  Psychiatric:        Mood and Affect: Mood normal.        Behavior: Behavior normal.      No results found for any visits on 07/26/24.  Assessment & Plan     Problem List Items Addressed This Visit       Cardiovascular and Mediastinum   Hypertension associated with diabetes (HCC) - Primary   Chronic and well controlled Continue current meds      Relevant Orders   Comprehensive metabolic panel with GFR   Senile purpura (HCC)   stable      Chronic heart failure with preserved ejection fraction (HCC)   Chronic diastolic heart failure with significant peripheral edema. Current regimen includes metolazone , Aldactone ,  and Jardiance . Edema persists despite increased metolazone  dosing. No acute respiratory distress. - Continue metolazone  twice weekly as per cardiologist's advice - Continue Aldactone  25 mg - Advise contacting cardiologist if edema persists - Consider using furosemide  if edema does not improve - Check with cardiologist for follow-up appointment        Endocrine   Hyperlipidemia associated with type 2 diabetes mellitus (HCC)   Currently managed with Zetia . - Continue Zetia       Relevant Orders   Comprehensive metabolic panel with GFR   Lipid panel   T2DM (type 2 diabetes mellitus) (HCC)   Type 2 diabetes mellitus with chronic kidney disease. Possible diabetic peripheral neuropathy with intermittent numbness in the feet, particularly when sitting. Differential includes neuropathy due to diabetes or compressive factors. - Order A1c, cholesterol, kidney and liver function tests - Order urine test for diabetes - Continue Jardiance  10 mg for diabetes and heart failure - Continue Mounjaro  with transition to 5 mg dose after current supply - Follow up with eye doctor in one month - Check B12 levels to assess for deficiency - Advise position changes to alleviate foot numbness      Relevant Orders   Hemoglobin A1c   Urine Microalbumin  w/creat. ratio     Genitourinary   Stage 3b chronic kidney disease (HCC)   Relevant Orders   B12   Iron, TIBC and Ferritin Panel     Other   RLS (restless legs syndrome)   Restless legs syndrome with occasional exacerbations. Potential contributing factors include iron or B12 deficiency. - Order ferritin and B12 levels - Review lab results to determine need for B12 supplementation      Relevant Orders   B12   Iron, TIBC and Ferritin Panel        Constipation Constipation not exacerbated by current medication regimen. No significant issues with Mounjaro . - Continue current bowel regimen as needed  General Health Maintenance General health maintenance discussed including vaccinations and screenings. - Advise tetanus vaccination as it has been over ten years since last dose - Advise updated flu and COVID vaccinations for fall        Return in about 3 months (around 10/26/2024) for chronic disease f/u.       Jon Eva, MD  [facility] [phone] (phone) [phone] (fax)  [facility]

## 2024-07-26 NOTE — Assessment & Plan Note (Signed)
 Restless legs syndrome with occasional exacerbations. Potential contributing factors include iron or B12 deficiency. - Order ferritin and B12 levels - Review lab results to determine need for B12 supplementation

## 2024-07-26 NOTE — Assessment & Plan Note (Signed)
 stable

## 2024-07-27 ENCOUNTER — Ambulatory Visit: Payer: Self-pay | Admitting: Family Medicine

## 2024-07-27 LAB — LIPID PANEL
Chol/HDL Ratio: 3.3 ratio (ref 0.0–4.4)
Cholesterol, Total: 205 mg/dL — ABNORMAL HIGH (ref 100–199)
HDL: 62 mg/dL (ref >39)
LDL Chol Calc (NIH): 119 mg/dL — ABNORMAL HIGH (ref 0–99)
Triglycerides: 138 mg/dL (ref 0–149)
VLDL Cholesterol Cal: 24 mg/dL (ref 5–40)

## 2024-07-27 LAB — COMPREHENSIVE METABOLIC PANEL WITH GFR
ALT: 10 IU/L (ref 0–32)
AST: 11 IU/L (ref 0–40)
Albumin: 4.1 g/dL (ref 3.7–4.7)
Alkaline Phosphatase: 149 IU/L — ABNORMAL HIGH (ref 44–121)
BUN/Creatinine Ratio: 18 (ref 12–28)
BUN: 26 mg/dL (ref 8–27)
Bilirubin Total: 0.4 mg/dL (ref 0.0–1.2)
CO2: 25 mmol/L (ref 20–29)
Calcium: 10.1 mg/dL (ref 8.7–10.3)
Chloride: 91 mmol/L — ABNORMAL LOW (ref 96–106)
Creatinine, Ser: 1.48 mg/dL — ABNORMAL HIGH (ref 0.57–1.00)
Globulin, Total: 2.4 g/dL (ref 1.5–4.5)
Glucose: 109 mg/dL — ABNORMAL HIGH (ref 70–99)
Potassium: 3.8 mmol/L (ref 3.5–5.2)
Sodium: 135 mmol/L (ref 134–144)
Total Protein: 6.5 g/dL (ref 6.0–8.5)
eGFR: 35 mL/min/1.73 — ABNORMAL LOW (ref >59)

## 2024-07-27 LAB — VITAMIN B12: Vitamin B-12: 306 pg/mL (ref 232–1245)

## 2024-07-27 LAB — MICROALBUMIN / CREATININE URINE RATIO
Creatinine, Urine: 20.6 mg/dL
Microalb/Creat Ratio: <15 mg/g{creat} (ref 0–29)
Microalbumin, Urine: <3 ug/mL

## 2024-07-27 LAB — IRON,TIBC AND FERRITIN PANEL
Ferritin: 38 ng/mL (ref 15–150)
Iron Saturation: 14 % — ABNORMAL LOW (ref 15–55)
Iron: 63 ug/dL (ref 27–139)
Total Iron Binding Capacity: 461 ug/dL — ABNORMAL HIGH (ref 250–450)
UIBC: 398 ug/dL — ABNORMAL HIGH (ref 118–369)

## 2024-07-27 LAB — SPECIMEN STATUS REPORT

## 2024-07-27 LAB — HEMOGLOBIN A1C
Est. average glucose Bld gHb Est-mCnc: 140 mg/dL
Hgb A1c MFr Bld: 6.5 % — ABNORMAL HIGH (ref 4.8–5.6)

## 2024-08-02 ENCOUNTER — Other Ambulatory Visit: Payer: Self-pay | Admitting: Family Medicine

## 2024-08-17 ENCOUNTER — Other Ambulatory Visit: Payer: Self-pay | Admitting: Family Medicine

## 2024-08-17 NOTE — Telephone Encounter (Signed)
 Requested medications are due for refill today.  unsure  Requested medications are on the active medications list.  yes  Last refill. 12/01/2021  Future visit scheduled.   yes  Notes to clinic.  Refill not delegated.    Requested Prescriptions  Pending Prescriptions Disp Refills   clotrimazole -betamethasone  (LOTRISONE ) cream 60 g 0    Sig: APPLY EXTERNALLY TO THE AFFECTED AREA TWICE DAILY AS NEEDED     Off-Protocol Failed - 08/17/2024  5:01 PM      Failed - Medication not assigned to a protocol, review manually.      Passed - Valid encounter within last 12 months    Recent Outpatient Visits           3 weeks ago Hypertension associated with diabetes [facility])   [place] [facility] Hailey, Jon HERO, MD   6 months ago Hypertension associated with diabetes [facility])   [place] [facility] [name], Jon HERO, MD       Future Appointments             In 3 weeks Agbor-Etang, Redell, MD [facility] [place]   In 2 months [name], Jon HERO, MD [facility], [place]

## 2024-08-17 NOTE — Telephone Encounter (Signed)
 Copied from CRM [id number]. Topic: Clinical - Medical Advice >> Aug 17, 2024 11:39 AM Tinnie C wrote: Reason for CRM: Restless legs are bothering patient a lot and she has been unable to sleep. Wants to know if there should be a change in medication or anything she can do. [phone]

## 2024-08-17 NOTE — Telephone Encounter (Signed)
 Copied from CRM [id number]. Topic: Clinical - Medication Refill >> Aug 17, 2024 11:37 AM Tinnie C wrote: Medication:  clotrimazole -betamethasone  (LOTRISONE ) cream  Has the patient contacted their pharmacy? No (Agent: If no, request that the patient contact the pharmacy for the refill. If patient does not wish to contact the pharmacy document the reason why and proceed with request.) (Agent: If yes, when and what did the pharmacy advise?)  This is the patient's preferred pharmacy:  [id number] [place], [place] - [address] AT [facility] OF [address] [place] [address] Phone: [phone] Fax: [phone]  Is this the correct pharmacy for this prescription? Yes If no, delete pharmacy and type the correct one.   Has the prescription been filled recently? No, 2023  Is the patient out of the medication? Yes  Has the patient been seen for an appointment in the last year OR does the patient have an upcoming appointment? Yes  Can we respond through MyChart? No, phone call  Agent: Please be advised that Rx refills may take up to 3 business days. We ask that you follow-up with your pharmacy.

## 2024-08-20 MED ORDER — CLOTRIMAZOLE-BETAMETHASONE 1-0.05 % EX CREA: TOPICAL_CREAM | CUTANEOUS | 0 refills | Status: AC

## 2024-08-28 DIAGNOSIS — I10 Essential (primary) hypertension: Secondary | ICD-10-CM | POA: Diagnosis not present

## 2024-08-28 DIAGNOSIS — I5032 Chronic diastolic (congestive) heart failure: Secondary | ICD-10-CM | POA: Diagnosis not present

## 2024-08-29 ENCOUNTER — Ambulatory Visit: Payer: Self-pay | Admitting: Cardiology

## 2024-08-29 ENCOUNTER — Ambulatory Visit: Admitting: Dietician

## 2024-08-29 LAB — BASIC METABOLIC PANEL WITH GFR
BUN/Creatinine Ratio: 15 (ref 12–28)
BUN: 23 mg/dL (ref 8–27)
CO2: 21 mmol/L (ref 20–29)
Calcium: 9.8 mg/dL (ref 8.7–10.3)
Chloride: 98 mmol/L (ref 96–106)
Creatinine, Ser: 1.51 mg/dL — ABNORMAL HIGH (ref 0.57–1.00)
Glucose: 97 mg/dL (ref 70–99)
Potassium: 4.7 mmol/L (ref 3.5–5.2)
Sodium: 136 mmol/L (ref 134–144)
eGFR: 34 mL/min/1.73 — ABNORMAL LOW (ref >59)

## 2024-09-06 ENCOUNTER — Encounter: Payer: Self-pay | Admitting: Family Medicine

## 2024-09-06 ENCOUNTER — Ambulatory Visit
Admission: RE | Admit: 2024-09-06 | Discharge: 2024-09-06 | Disposition: A | Discharge status: Home / Self Care | Source: Ambulatory Visit | Attending: Family Medicine | Admitting: Family Medicine

## 2024-09-06 ENCOUNTER — Ambulatory Visit (INDEPENDENT_AMBULATORY_CARE_PROVIDER_SITE_OTHER): Admitting: Family Medicine

## 2024-09-06 ENCOUNTER — Ambulatory Visit
Admission: RE | Admit: 2024-09-06 | Discharge: 2024-09-06 | Disposition: A | Discharge status: Home / Self Care | Attending: Family Medicine | Admitting: Family Medicine

## 2024-09-06 VITALS — BP 97/53 | HR 77 | Ht 61.0 in | Wt 203.1 lb

## 2024-09-06 DIAGNOSIS — Z23 Encounter for immunization: Secondary | ICD-10-CM | POA: Diagnosis not present

## 2024-09-06 DIAGNOSIS — M546 Pain in thoracic spine: Secondary | ICD-10-CM | POA: Diagnosis not present

## 2024-09-06 DIAGNOSIS — M47816 Spondylosis without myelopathy or radiculopathy, lumbar region: Secondary | ICD-10-CM | POA: Diagnosis not present

## 2024-09-06 DIAGNOSIS — M5136 Other intervertebral disc degeneration, lumbar region with discogenic back pain only: Secondary | ICD-10-CM | POA: Diagnosis not present

## 2024-09-06 DIAGNOSIS — M47814 Spondylosis without myelopathy or radiculopathy, thoracic region: Secondary | ICD-10-CM | POA: Diagnosis not present

## 2024-09-06 DIAGNOSIS — M545 Low back pain, unspecified: Secondary | ICD-10-CM | POA: Insufficient documentation

## 2024-09-06 MED ORDER — OXYCODONE HCL 5 MG PO TABS: 5.0000 mg | ORAL_TABLET | Freq: Four times a day (QID) | ORAL | 0 refills | Status: DC | PRN

## 2024-09-06 NOTE — Progress Notes (Signed)
 Acute visit   Patient: Gloria Mercer   DOB: [birth year]   83 y.o. Female  MRN: [id number] PCP: Myrla Jon HERO, MD   Chief Complaint  Patient presents with   Acute Visit   Back Pain    Present X 3 weeks located mid back radiating across the back. She reports pain is a little worse since it started and is constant. She reports she has only been using some salonpas and heating pad for treatment but no form of medication.    Subjective    Discussed the use of AI scribe software for clinical note transcription with the patient, who gave verbal consent to proceed.  History of Present Illness   Gloria Mercer is an 83 year old female who presents with worsening back pain. She is accompanied by her son.  She has experienced worsening back pain for three weeks, localized to the low and mid back, extending to the top of her hips. The pain is different from previous episodes and does not radiate down her legs. Lidocaine  patches and Salonpas provide some relief but do not completely alleviate the pain. There have been no falls, injuries, or specific incidents triggering the pain.  There is no weakness or numbness in her arms or legs, and no changes in bowel or bladder function. She uses a nightly aid for bowel movements and manages well. She has adverse reactions to pain medications, including nausea, dizziness, and constipation, and has not taken any recently due to these side effects.  She has long-standing difficulty in straightening up and staying upright, which has not been painful until now, affecting her quality of life and daily activities. Her mother had similar back problems.        Review of Systems  Objective    BP (!) 97/53 (BP Location: Left Arm, Patient Position: Sitting, Cuff Size: Normal)   Pulse 77   Ht 5' 1 (1.549 m)   Wt 203 lb 1.6 oz (92.1 kg)   SpO2 100%   BMI 38.38 kg/m  Physical Exam   Physical Exam   MUSCULOSKELETAL: Tenderness over  midline spine from T6-L4. NEUROLOGICAL: Strength intact in lower extremities. Sensation intact bilaterally.      No results found for any visits on 09/06/24.  Assessment & Plan     Problem List Items Addressed This Visit   None Visit Diagnoses       Acute midline thoracic back pain    -  Primary   Relevant Medications   oxyCODONE  (ROXICODONE ) 5 MG immediate release tablet   Other Relevant Orders   DG Thoracic Spine 2 View   DG Lumbar Spine Complete     Acute midline low back pain without sciatica       Relevant Medications   oxyCODONE  (ROXICODONE ) 5 MG immediate release tablet   Other Relevant Orders   DG Thoracic Spine 2 View   DG Lumbar Spine Complete           Low and mid back pain, possible vertebral compression fracture of thoracic and lumbar spine Low and mid back pain for three weeks, worsening, with tenderness over the midline. No radiation to legs, weakness, or numbness. Differential includes vertebral compression fracture, likely due to age-related changes. No recent falls or injuries reported. Pain is significantly affecting quality of life. Discussed the possibility of vertebral compression fractures, common in older women, and the potential for vertebroplasty if confirmed. She expressed concern about surgery but acknowledged the  impact on quality of life. - Order x-rays of thoracic and lumbar spine to assess for compression fracture. - Prescribe oxycodone  5 mg, up to four times a day as needed for pain, with instructions to try half a dose if needed. - Advise continuation of lidocaine  patches and heating pad for pain management. - Discuss potential referral to neurosurgery for evaluation and possible vertebroplasty if compression fracture is confirmed. - Consider calcitonin for pain management if compression fracture is confirmed.  Chronic abnormal posture with impaired mobility Chronic abnormal posture with difficulty straightening up, not previously painful but  now contributing to discomfort. She has been hesitant to seek treatment due to fear of surgery. Discussed the importance of addressing posture during neurosurgery consultation if referred. - Discuss with neurosurgery about posture and potential interventions during consultation if referred for compression fracture.  Chronic kidney disease Chronic kidney disease limits the use of NSAIDs for pain management. - Avoid NSAIDs due to kidney concerns.         Meds ordered this encounter  Medications   oxyCODONE  (ROXICODONE ) 5 MG immediate release tablet    Sig: Take 1 tablet (5 mg total) by mouth every 6 (six) hours as needed for severe pain (pain score 7-10).    Dispense:  20 tablet    Refill:  0     Return if symptoms worsen or fail to improve.      Jon Eva, MD  [facility] [phone] (phone) [phone] (fax)  [facility]

## 2024-09-12 ENCOUNTER — Ambulatory Visit: Payer: Self-pay | Admitting: Family Medicine

## 2024-09-12 DIAGNOSIS — M545 Low back pain, unspecified: Secondary | ICD-10-CM

## 2024-09-12 DIAGNOSIS — M546 Pain in thoracic spine: Secondary | ICD-10-CM

## 2024-09-13 ENCOUNTER — Ambulatory Visit: Admitting: Cardiology

## 2024-09-13 MED ORDER — OXYCODONE HCL 5 MG PO TABS: 5.0000 mg | ORAL_TABLET | Freq: Four times a day (QID) | ORAL | 0 refills | Status: AC | PRN

## 2024-09-13 NOTE — Telephone Encounter (Signed)
 Contacted patient and advised of your message. Patient requests a refill of oxycodone  if able (pended). States she has a few left but wanted to make sure she had some for over the weekend if needed.

## 2024-09-19 ENCOUNTER — Telehealth: Payer: Self-pay | Admitting: Adult Health

## 2024-09-19 NOTE — Telephone Encounter (Signed)
 Called to confirm/remind patient of their appointment at the [facility] on 09/20/24.   Appointment:   [x] Confirmed  [] Left mess   [] No answer/No voice mail  [] VM Full/unable to leave message  [] Phone not in service  Patient reminded to bring all medications and/or complete list.  Confirmed patient has transportation. Gave directions, instructed to utilize valet parking.

## 2024-09-20 ENCOUNTER — Encounter: Payer: Self-pay | Admitting: Adult Health

## 2024-09-20 ENCOUNTER — Ambulatory Visit: Attending: Internal Medicine | Admitting: Adult Health

## 2024-09-20 VITALS — BP 119/52 | HR 76 | Wt 207.0 lb

## 2024-09-20 DIAGNOSIS — Z6839 Body mass index (BMI) 39.0-39.9, adult: Secondary | ICD-10-CM | POA: Diagnosis not present

## 2024-09-20 DIAGNOSIS — G4733 Obstructive sleep apnea (adult) (pediatric): Secondary | ICD-10-CM | POA: Diagnosis not present

## 2024-09-20 DIAGNOSIS — I5032 Chronic diastolic (congestive) heart failure: Secondary | ICD-10-CM | POA: Diagnosis not present

## 2024-09-20 DIAGNOSIS — Z79899 Other long term (current) drug therapy: Secondary | ICD-10-CM | POA: Diagnosis not present

## 2024-09-20 DIAGNOSIS — E1122 Type 2 diabetes mellitus with diabetic chronic kidney disease: Secondary | ICD-10-CM | POA: Diagnosis not present

## 2024-09-20 DIAGNOSIS — N1832 Chronic kidney disease, stage 3b: Secondary | ICD-10-CM | POA: Insufficient documentation

## 2024-09-20 DIAGNOSIS — Z7984 Long term (current) use of oral hypoglycemic drugs: Secondary | ICD-10-CM | POA: Insufficient documentation

## 2024-09-20 DIAGNOSIS — I130 Hypertensive heart and chronic kidney disease with heart failure and stage 1 through stage 4 chronic kidney disease, or unspecified chronic kidney disease: Secondary | ICD-10-CM | POA: Insufficient documentation

## 2024-09-20 DIAGNOSIS — R600 Localized edema: Secondary | ICD-10-CM | POA: Insufficient documentation

## 2024-09-20 DIAGNOSIS — Z7985 Long-term (current) use of injectable non-insulin antidiabetic drugs: Secondary | ICD-10-CM | POA: Insufficient documentation

## 2024-09-20 MED ORDER — METOLAZONE 2.5 MG PO TABS: 2.5000 mg | ORAL_TABLET | ORAL | 5 refills | Status: AC

## 2024-09-20 NOTE — Progress Notes (Signed)
 ADV[facility]ED [facility] CLINIC  NOTE  Referring Physician: Myrla Jon HERO, MD Primary Care: Myrla Jon HERO, MD Primary Cardiologist: Redell Cave, MD  Chief complaint: HF  HPI: Gloria Mercer is a 83 y.o. female with a hx of HFpEF, hypertension, obesity, CKD 3b.   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 HST 6/22 AHI 13.5 with sat down to 81%   Echocardiogram 7/24 EF 60-65% G1DD RV ok Personally reviewed  Remains on metolazone  2.5 on Tues and Saturdays. Had been on torsemide  100/50 but Nephrology stopped the evening dose as SCr bumped from 1.41-> 1.67. At we bumped back to 100/50 due to volume overload and prescribed Furoscix   Today she returns for HF follow up. Complaining of nausea and back pain. Limited by back pain. Able to walk up to the clinic. Spends most of her time in the house. Denies PND/Orthopnea. Chronic lower extremity edema. Appetite ok. No fever or chills.  Taking all medications. She does not want to take metolazone  twice a week due to urinary frequency. Wants to try weekly metolazone .  Family driver her to appointments.    Past Medical History:  Diagnosis Date   Allergic rhinitis    on allergy shots, sees Dr. Milissa   Anemia    Arthritis    B12 deficiency    Back pain    Bilateral edema of lower extremity    Cataract    CHF (congestive heart failure) (HCC)    Constipation    Edema    Gallbladder problem    GERD (gastroesophageal reflux disease)    History of stomach ulcers    Hyperlipidemia    Hypertension    Joint pain    Macular degeneration    followed by Dr Rutherford at [facility]   Osteoarthritis    Other fatigue    Prediabetes    Proteinuria    RLS (restless legs syndrome)    Sciatica    Shortness of breath    Shortness of breath on exertion    Sleep apnea    Stage 3 chronic kidney disease (HCC)    Swallowing difficulty     Vitamin D  deficiency     Current Outpatient Medications  Medication Sig Dispense Refill   allopurinol (ZYLOPRIM) 100 MG tablet Take 100 mg by mouth daily.     azelastine  (ASTELIN ) 0.1 % nasal spray USE 2 SPRAYS IN EACH NOSTRIL DAILY 30 mL 11   bisacodyl  (DULCOLAX) 5 MG EC tablet Take 5 mg by mouth daily as needed for moderate constipation.     Cholecalciferol (VITAMIN D3) 5000 units CAPS Take 1 capsule by mouth daily.     clotrimazole -betamethasone  (LOTRISONE ) cream APPLY EXTERNALLY TO THE AFFECTED AREA TWICE DAILY AS NEEDED 60 g 0   esomeprazole (NEXIUM) 20 MG capsule Take 40 mg by mouth daily at 12 noon.      ezetimibe  (ZETIA ) 10 MG tablet TAKE 1 TABLET(10 MG) BY MOUTH DAILY 90 tablet 2   Fluticasone-Umeclidin-Vilant (TRELEGY ELLIPTA ) 100-62.5-25 MCG/ACT AEPB Inhale 1 puff into the lungs daily. 1 each 11   Furosemide  (FUROSCIX ) 80 MG/10ML CTKT Inject 1 Dose into the skin daily as needed (for swelling). Call the Heart Failure Clinic for further instruction before taking this medicine     ipratropium (ATROVENT ) 0.03 % nasal spray USE 2 SPRAYS IN EACH NOSTRIL EVERY 8 HOURS AS NEEDED 90 mL 1  JARDIANCE  10 MG TABS tablet TAKE 1 TABLET(10 MG) BY MOUTH DAILY BEFORE BREAKFAST 30 tablet 6   levocetirizine (XYZAL) 5 MG tablet Take 2.5 mg by mouth every other day as needed for allergies.     metolazone  (ZAROXOLYN ) 2.5 MG tablet Take 2.5 mg by mouth 2 (two) times a week.     montelukast  (SINGULAIR ) 10 MG tablet TAKE 1 TABLET(10 MG) BY MOUTH DAILYTAKE 1 TABLET(10 MG) BY MOUTH DAILY 90 tablet 3   Multiple Vitamins-Minerals (EYE VITAMINS PO) Take 2 tablets by mouth daily. Ocuvite 50+     ondansetron  (ZOFRAN ) 4 MG tablet Take 1 tablet (4 mg total) by mouth every 8 (eight) hours as needed for nausea or vomiting. 20 tablet 0   oxyCODONE  (ROXICODONE ) 5 MG immediate release tablet Take 1 tablet (5 mg total) by mouth every 6 (six) hours as needed for severe pain (pain score 7-10). 20 tablet 0   polyethylene  glycol (MIRALAX  / GLYCOLAX ) 17 g packet Take 17 g by mouth daily as needed.     potassium chloride  SA (KLOR-CON  M) 20 MEQ tablet Take 2 tablets (40 mEq total) by mouth 2 (two) times daily. 360 tablet 2   pramipexole  (MIRAPEX ) 0.75 MG tablet TAKE 1 TABLET(0.75 MG) BY MOUTH AT BEDTIME 90 tablet 3   spironolactone  (ALDACTONE ) 25 MG tablet TAKE 1 TABLET(25 MG) BY MOUTH DAILY 90 tablet 3   tirzepatide  (MOUNJARO ) 2.5 MG/0.5ML Pen Inject 2.5 mg into the skin once a week. 2 mL 0   tirzepatide  (MOUNJARO ) 5 MG/0.5ML Pen Inject 5 mg into the skin once a week. 2 mL 2   torsemide  (DEMADEX ) 100 MG tablet Take 1 tablet (100 mg total) by mouth daily. 90 tablet 3   triamcinolone  (NASACORT ) 55 MCG/ACT AERO nasal inhaler Place 2 sprays into the nose daily.     triamcinolone  ointment (KENALOG ) 0.5 % Apply 1 Application topically 2 (two) times daily as needed.     No current facility-administered medications for this visit.    Allergies  Allergen Reactions   Erythromycin Other (See Comments)    Other reaction(s): Headache Headache and GI upset Headache and GI upset   Losartan Nausea And Vomiting   Metoprolol Nausea And Vomiting   Simvastatin Other (See Comments)    Other reaction(s): Muscle Pain Pain lower extremities Pain lower extremities   Dexlansoprazole Diarrhea   Lisinopril Cough   Prednisone      Other reaction(s): Constipation GI upset      Social History   Socioeconomic History   Marital status: Divorced    Spouse name: Not on file   Number of children: 1   Years of education: Not on file   Highest education level: Bachelor's degree (e.g., BA, AB, BS)  Occupational History   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, theatre/television/film  Tobacco Use   Smoking status: Former    Current packs/day: 0.00    Average packs/day: 1 pack/day for 11.0 years (11.0 ttl pk-yrs)    Types: Cigarettes    Start date: 11/29/1965    Quit date: 11/28/1976    Years since quitting: 47.8   Smokeless  tobacco: Never  Vaping Use   Vaping status: Never Used  Substance and Sexual Activity   Alcohol use: Not Currently    Comment: rarely, at holidays   Drug use: No   Sexual activity: Not Currently  Other Topics Concern   Not on file  Social History Narrative   Not on file   Social  Drivers of Corporate investment banker Strain: Low Risk  (07/18/2024)   Overall Financial Resource Strain (CARDIA)    Difficulty of Paying Living Expenses: Not hard at all  Food Insecurity: No Food Insecurity (07/18/2024)   Hunger Vital Sign    Worried About Running Out of Food in the Last Year: Never true    Ran Out of Food in the Last Year: Never true  Transportation Needs: No Transportation Needs (07/18/2024)   PRAPARE - Administrator, Civil Service (Medical): No    Lack of Transportation (Non-Medical): No  Physical Activity: Inactive (07/18/2024)   Exercise Vital Sign    Days of Exercise per Week: 0 days    Minutes of Exercise per Session: 0 min  Stress: No Stress Concern Present (07/18/2024)   [employer] of Occupational Health - Occupational Stress Questionnaire    Feeling of Stress: Not at all  Social Connections: Socially Isolated (07/18/2024)   Social Connection and Isolation Panel    Frequency of Communication with Friends and Family: More than three times a week    Frequency of Social Gatherings with Friends and Family: Not on file    Attends Religious Services: Never    Database administrator or Organizations: No    Attends Banker Meetings: Never    Marital Status: Divorced  Catering manager Violence: Not At Risk (07/18/2024)   Humiliation, Afraid, Rape, and Kick questionnaire    Fear of Current or Ex-Partner: No    Emotionally Abused: No    Physically Abused: No    Sexually Abused: No      Family History  Problem Relation Age of Onset   Hyperlipidemia Mother    Hypertension Mother    Seizures Mother    Hypertension Father    Heart disease Father 44        several heart attacks   Diabetes Father        diet controlled   Myelodysplastic syndrome Sister    Prostate cancer Paternal Grandfather    Breast cancer Neg Hx    Cervical cancer Neg Hx    Vitals:   09/20/24 1156  BP: (!) 119/52  Pulse: 76  SpO2: 97%  Weight: 207 lb (93.9 kg)     Wt Readings from Last 3 Encounters:  09/20/24 207 lb (93.9 kg)  09/06/24 203 lb 1.6 oz (92.1 kg)  07/26/24 211 lb (95.7 kg)    PHYSICAL EXAM: General:   No resp difficulty Neck: no JVD.  Cor: Regular rate & rhythm.  Lungs: clear Abdomen: soft, nontender, nondistended.  Extremities: R and LLE prominent varicosities 1+  edema Neuro: alert & oriented x3  ASSESSMENT & PLAN:  1. Chronic diastolic HF/lower extremity edema - suspect combination of diastolic HF and venous insufficiency - Echocardiogram 03/18/2021 EF 55-60% G1DD RV ok \ - Echocardiogram 7/24 EF 60-65% G1DD RV ok  -NYHA II. Appears euvolemic.  -- Continue torsemide  100 mg daily  - Continue spiro 25 daily - Cut back metolazone  to 2.5 mg every Tuesday and can take extra 2.5 mg on Saturdays if needed.    - Continue Jardiance  10 mg daily.  - Continue Ozempic  -Discussed compression stocking however she has difficulty placing socks on. Continue to elevated lower extremities when seated.    2. OSA - Mild to moderate AHI 13.5 - refuses CPAP  3. Morbid obesity Body mass index is 39.11 kg/m.  - Continue GLP1.   4. CKD  3b - follows with Dr.  Singh at [facility] - Continue Jardiance  & GLP1  5. DM2 - Continue Jardiance  and Ozempic    Follow up in 6 months with Ellouise Class for volume management.    Greig Mosses, NP  12:04 PM

## 2024-09-20 NOTE — Patient Instructions (Signed)
 Medication Changes:  DECREASE Metolazone  2.5mg  tab weekly. You may take an extra tab a week as needed for swelling.    Follow-Up in: Please follow up with the [facility] in 6 months with Ellouise Class, FNP.   Thank you for choosing [facility].    At the [facility], you and your health needs are our priority. We have a designated team specialized in the treatment of Heart Failure. This Care Team includes your primary Heart Failure Specialized Cardiologist (physician), Advanced Practice Providers (APPs- Physician Assistants and Nurse Practitioners), and Pharmacist who all work together to provide you with the care you need, when you need it.   You may see any of the following providers on your designated Care Team at your next follow up:  Dr. Toribio Fuel Dr. Ezra Shuck Dr. Ria Commander Dr. Morene Brownie Ellouise Class, FNP Jaun Bash, RPH-CPP  Please be sure to bring in all your medications bottles to every appointment.   Need to Contact Us :  If you have any questions or concerns before your next appointment please send us  a message through [place] or call our office at [phone].    TO LEAVE A MESSAGE FOR THE NURSE SELECT OPTION 2, PLEASE LEAVE A MESSAGE INCLUDING: YOUR NAME DATE OF BIRTH CALL BACK NUMBER REASON FOR CALL**this is important as we prioritize the call backs  YOU WILL RECEIVE A CALL BACK THE SAME DAY AS LONG AS YOU CALL BEFORE 4:00 PM

## 2024-09-26 ENCOUNTER — Ambulatory Visit: Admitting: Cardiology

## 2024-10-01 NOTE — Progress Notes (Signed)
 Referring Physician:  Myrla Jon HERO, MD [address] [place],  [place] [address]  Primary Physician:  Myrla Jon HERO, MD  History of Present Illness: 10/08/2024 Ms. Tabbatha Bordelon is here today with a chief complaint of acute on chronic midline lumbar back pain.  This began 3 weeks ago without any inciting event.  This has improved since it initially began, but is still present.  It is worse with walking, bending, or lifting.  She denies any radiation to her legs.  She denies any new weakness, numbness or tingling, saddle anesthesia or incontinence.   Weakness: none Bowel/Bladder Dysfunction: none  Conservative measures:  Physical therapy:  Has not participated in  Multimodal medical therapy including regular antiinflammatories:  sonopas patches, heating pad, oxycodone , prednisone , flexeril , and percocet,  Injections:  No epidural steroid injections  Past Surgery: none  [name] has no symptoms of cervical myelopathy.  The symptoms are causing a significant impact on the patient's life.   Review of Systems:  A 10 point review of systems is negative, except for the pertinent positives and negatives detailed in the HPI.  Past Medical History: Past Medical History:  Diagnosis Date   Allergic rhinitis    on allergy shots, sees Dr. Milissa   Anemia    Arthritis    B12 deficiency    Back pain    Bilateral edema of lower extremity    Cataract    CHF (congestive heart failure) (HCC)    Constipation    Edema    Gallbladder problem    GERD (gastroesophageal reflux disease)    History of stomach ulcers    Hyperlipidemia    Hypertension    Joint pain    Macular degeneration    followed by Dr Rutherford at [facility]   Osteoarthritis    Other fatigue    Prediabetes    Proteinuria    RLS (restless legs syndrome)    Sciatica    Shortness of breath    Shortness of breath on exertion    Sleep apnea    Stage 3 chronic kidney disease (HCC)     Swallowing difficulty    Vitamin D  deficiency     Past Surgical History: Past Surgical History:  Procedure Laterality Date   CHOLECYSTECTOMY  2002   TUBAL LIGATION  1978    Allergies: Allergies as of 10/08/2024 - Review Complete 10/08/2024  Allergen Reaction Noted   Erythromycin Other (See Comments) 10/19/2012   Losartan Nausea And Vomiting 06/14/2013   Metoprolol Nausea And Vomiting 09/17/2016   Simvastatin Other (See Comments) 01/09/2013   Dexlansoprazole Diarrhea 01/14/2016   Lisinopril Cough 06/14/2013   Prednisone   04/02/2016    Medications: Outpatient Encounter Medications as of 10/08/2024  Medication Sig   allopurinol (ZYLOPRIM) 100 MG tablet Take 100 mg by mouth daily.   azelastine  (ASTELIN ) 0.1 % nasal spray USE 2 SPRAYS IN EACH NOSTRIL DAILY   bisacodyl  (DULCOLAX) 5 MG EC tablet Take 5 mg by mouth daily as needed for moderate constipation.   Cholecalciferol (VITAMIN D3) 5000 units CAPS Take 1 capsule by mouth daily.   clotrimazole -betamethasone  (LOTRISONE ) cream APPLY EXTERNALLY TO THE AFFECTED AREA TWICE DAILY AS NEEDED   esomeprazole (NEXIUM) 20 MG capsule Take 40 mg by mouth daily at 12 noon.    ezetimibe  (ZETIA ) 10 MG tablet TAKE 1 TABLET(10 MG) BY MOUTH DAILY   Fluticasone-Umeclidin-Vilant (TRELEGY ELLIPTA ) 100-62.5-25 MCG/ACT AEPB Inhale 1 puff into the lungs daily.   Furosemide  (FUROSCIX ) 80 MG/10ML  CTKT Inject 1 Dose into the skin daily as needed (for swelling). Call the Heart Failure Clinic for further instruction before taking this medicine   ipratropium (ATROVENT ) 0.03 % nasal spray USE 2 SPRAYS IN EACH NOSTRIL EVERY 8 HOURS AS NEEDED   JARDIANCE  10 MG TABS tablet TAKE 1 TABLET(10 MG) BY MOUTH DAILY BEFORE BREAKFAST   levocetirizine (XYZAL) 5 MG tablet Take 2.5 mg by mouth every other day as needed for allergies.   metolazone  (ZAROXOLYN ) 2.5 MG tablet Take 1 tablet (2.5 mg total) by mouth once a week. You may take an extra 1 tab as needed for swelling    montelukast  (SINGULAIR ) 10 MG tablet TAKE 1 TABLET(10 MG) BY MOUTH DAILYTAKE 1 TABLET(10 MG) BY MOUTH DAILY   Multiple Vitamins-Minerals (EYE VITAMINS PO) Take 2 tablets by mouth daily. Ocuvite 50+   ondansetron  (ZOFRAN ) 4 MG tablet Take 1 tablet (4 mg total) by mouth every 8 (eight) hours as needed for nausea or vomiting.   oxyCODONE  (ROXICODONE ) 5 MG immediate release tablet Take 1 tablet (5 mg total) by mouth every 6 (six) hours as needed for severe pain (pain score 7-10).   polyethylene glycol (MIRALAX  / GLYCOLAX ) 17 g packet Take 17 g by mouth daily as needed.   potassium chloride  SA (KLOR-CON  M) 20 MEQ tablet Take 2 tablets (40 mEq total) by mouth 2 (two) times daily.   pramipexole  (MIRAPEX ) 0.75 MG tablet TAKE 1 TABLET(0.75 MG) BY MOUTH AT BEDTIME   spironolactone  (ALDACTONE ) 25 MG tablet TAKE 1 TABLET(25 MG) BY MOUTH DAILY   tirzepatide  (MOUNJARO ) 2.5 MG/0.5ML Pen Inject 2.5 mg into the skin once a week.   tirzepatide  (MOUNJARO ) 5 MG/0.5ML Pen Inject 5 mg into the skin once a week.   torsemide  (DEMADEX ) 100 MG tablet Take 1 tablet (100 mg total) by mouth daily.   triamcinolone  (NASACORT ) 55 MCG/ACT AERO nasal inhaler Place 2 sprays into the nose daily.   triamcinolone  ointment (KENALOG ) 0.5 % Apply 1 Application topically 2 (two) times daily as needed.   No facility-administered encounter medications on file as of 10/08/2024.    Social History: Social History   Tobacco Use   Smoking status: Former    Current packs/day: 0.00    Average packs/day: 1 pack/day for 11.0 years (11.0 ttl pk-yrs)    Types: Cigarettes    Start date: 11/29/1965    Quit date: 11/28/1976    Years since quitting: 47.8   Smokeless tobacco: Never  Vaping Use   Vaping status: Never Used  Substance Use Topics   Alcohol use: Not Currently    Comment: rarely, at holidays   Drug use: No    Family Medical History: Family History  Problem Relation Age of Onset   Hyperlipidemia Mother    Hypertension Mother     Seizures Mother    Hypertension Father    Heart disease Father 72       several heart attacks   Diabetes Father        diet controlled   Myelodysplastic syndrome Sister    Prostate cancer Paternal Grandfather    Breast cancer Neg Hx    Cervical cancer Neg Hx     Physical Examination: @VITALWITHPAIN @  General: Patient is well developed, well nourished, calm, collected, and in no apparent distress. Attention to examination is appropriate.  Psychiatric: Patient is non-anxious.  Head:  Pupils equal, round, and reactive to light.  ENT:  Oral mucosa appears well hydrated.  Neck:   Supple.  Full range  of motion.  Respiratory: Patient is breathing without any difficulty.  Extremities: No edema.  Vascular: Palpable dorsal pedal pulses.  Skin:   On exposed skin, there are no abnormal skin lesions.  NEUROLOGICAL:     Awake, alert, oriented to person, place, and time.  Speech is clear and fluent. Fund of knowledge is appropriate.   Cranial Nerves: Pupils equal round and reactive to light.  Facial tone is symmetric.   ROM of spine: Tenderness palpation of thoracic and lumbar spine.  Strength:  At least 4/5 in bilateral lower extremities.  Difficult to obtain reflexes to bilateral patella and Achilles.  Patient walks with a rollator.   Medical Decision Making  Imaging:  EXAM: 3 VIEW(S) XRAY OF THE THORACIC SPINE, 5 VIEW(S) XRAY OF THE LUMBAR SPINE 09/06/2024 02:19:21 PM   COMPARISON: Thoracic spine CT and reconstructions 03/18/2022 and CT abdomen and pelvis and reconstructions 12/16/2022.   CLINICAL HISTORY: back pain midline thoracic and lumbar, no fall or injury. back pain midline thoracic and lumbar, no fall or injury, Acute midline thoracic back pain, Acute midline low back pain without sciatica. Pain for 3 weeks.   FINDINGS:   THORACIC SPINE: BONES: Osteopenia. No acute fracture is evident. Moderate thoracic kyphosis and otherwise normal alignment.  Extensive thoracic spine bridging enthesopathy consistent with DISH. Comparison to the prior study reveals no significant interval change.   DISCS AND DEGENERATIVE CHANGES: The thoracic discs are degenerated. Mild facet spurs in the lower thoracic spine.   SOFT TISSUES: The visualized lungs appear clear.   LUMBAR SPINE: BONES: Mild lumbar levoscoliosis, the apex at L2-L3. Grade 1 anterolisthesis at L4-L5 and L5-S1. Trace retrolisthesis at L1-L2, L2-L3, L3-L4.   No new or worsening abnormal alignment. Mild upper plate anterior wedging of the L4 and L5 vertebral body with no overt interval progression.   Mild osteopenia. New mild superior endplate anterior wedge deformity of the L2 vertebral body which is probably either subacute or chronic but was not seen previously.   Bulky bridging osteophytes at T12-L1 and L1-L2. Prominent marginal osteophytes at the remaining levels without bridging.   DISCS AND DEGENERATIVE CHANGES: Advanced facet hypertrophy from L3-L4 down. The lumbar discs are collapsed except at L5 where there is only mild disc space loss.   SOFT TISSUES: Spurring of the SI joints. Mild to moderate arthrosis of the hip joints. Bilateral clips in the pelvis. Cholecystectomy clips right upper abdomen. Patchy calcification in the abdominal aorta.   IMPRESSION: 1. No acute fracture or new/worsening abnormal alignment in the thoracic or lumbar spine. 2. New mild superior endplate anterior wedge deformity of the L2 vertebral body, likely subacute or chronic. 3. Extensive thoracic spine bridging enthesopathy consistent with DISH. 4. Advanced facet hypertrophy from L3-L4 down. Spondylosis. Multilevel degenerative disc disease. I have personally reviewed the images and agree with the above interpretation.  Assessment and Plan: Ms. Califano is a pleasant 83 y.o. female with acute on chronic low back pain x 3 weeks and with finding of new, likely subacute L2 vertebral body  fracture.  She also has extensive thoracic DISH.  Likely, patient's pain is improving.  Plan includes the following forward:  -Referral to pain for potential injection options.  Talk to patient about getting MRI, but patient states that she does not think she can tolerate it secondary to pain.  Will hold for now. - Due to new compression fracture, bone density scan has been ordered to evaluate patient for osteoporosis. - Told patient to follow-up with me on  as-needed basis.  Happy to see in clinic whenever she would like.   Thank you for involving me in the care of this patient.   I spent a total of 30 minutes in both face-to-face and non-face-to-face activities for this visit on the date of this encounter including preparing to see the patient, obtaining and reviewing separately obtained history, performing medically appropriate examination, counseling the patient and their family,  documenting clinical information, independently interpreting results, coordination of care.   Lyle Decamp, PA-C Dept. of Neurosurgery

## 2024-10-03 ENCOUNTER — Ambulatory Visit: Attending: Cardiology | Admitting: Cardiology

## 2024-10-03 ENCOUNTER — Encounter: Payer: Self-pay | Admitting: Cardiology

## 2024-10-03 VITALS — BP 128/64 | HR 79 | Ht 61.0 in | Wt 204.2 lb

## 2024-10-03 DIAGNOSIS — Z6838 Body mass index (BMI) 38.0-38.9, adult: Secondary | ICD-10-CM | POA: Diagnosis not present

## 2024-10-03 DIAGNOSIS — I10 Essential (primary) hypertension: Secondary | ICD-10-CM | POA: Insufficient documentation

## 2024-10-03 DIAGNOSIS — I5032 Chronic diastolic (congestive) heart failure: Secondary | ICD-10-CM | POA: Insufficient documentation

## 2024-10-03 NOTE — Patient Instructions (Signed)

## 2024-10-03 NOTE — Progress Notes (Signed)
 Cardiology Office Note:    Date:  10/03/2024   ID:  Gloria Mercer, DOB 1940-12-04, MRN [id number]  PCP:  Myrla Jon HERO, MD   [facility]  Cardiologist:  Redell Cave, MD  Advanced Practice Provider:  No care team member to display Electrophysiologist:  None       Referring MD: Myrla Jon HERO, MD   Chief Complaint  Patient presents with   Follow-up    6 week follow up  / pt has been doing well with no complaints of chest pain, chest pressure or SOB, medciation reviewed verbally with patient    History of Present Illness:    Gloria Mercer is a 83 y.o. female with a hx of HFpEF, hypertension, obesity, CKD presents for follow-up.   Doing okay, still with swelling in her legs.  Takes metolazone  once weekly due to polyuria and inconvenience of having to urinate frequently.  Denies shortness of breath.  Feels well otherwise.  Prior notes Echocardiogram 03/18/2021 normal systolic function, impaired relaxation, EF 55 to 60%. Not tolerant to several BP meds including losartan, metoprolol, lisinopril. Intolerant to metolazone , developed significant constipation.   Past Medical History:  Diagnosis Date   Allergic rhinitis    on allergy shots, sees Dr. Milissa   Anemia    Arthritis    B12 deficiency    Back pain    Bilateral edema of lower extremity    Cataract    CHF (congestive heart failure) (HCC)    Constipation    Edema    Gallbladder problem    GERD (gastroesophageal reflux disease)    History of stomach ulcers    Hyperlipidemia    Hypertension    Joint pain    Macular degeneration    followed by Dr Rutherford at [facility]   Osteoarthritis    Other fatigue    Prediabetes    Proteinuria    RLS (restless legs syndrome)    Sciatica    Shortness of breath    Shortness of breath on exertion    Sleep apnea    Stage 3 chronic kidney disease (HCC)    Swallowing difficulty    Vitamin D  deficiency     Past Surgical History:   Procedure Laterality Date   CHOLECYSTECTOMY  2002   TUBAL LIGATION  1978    Current Medications: Current Meds  Medication Sig   allopurinol (ZYLOPRIM) 100 MG tablet Take 100 mg by mouth daily.   azelastine  (ASTELIN ) 0.1 % nasal spray USE 2 SPRAYS IN EACH NOSTRIL DAILY   bisacodyl  (DULCOLAX) 5 MG EC tablet Take 5 mg by mouth daily as needed for moderate constipation.   Cholecalciferol (VITAMIN D3) 5000 units CAPS Take 1 capsule by mouth daily.   clotrimazole -betamethasone  (LOTRISONE ) cream APPLY EXTERNALLY TO THE AFFECTED AREA TWICE DAILY AS NEEDED   esomeprazole (NEXIUM) 20 MG capsule Take 40 mg by mouth daily at 12 noon.    ezetimibe  (ZETIA ) 10 MG tablet TAKE 1 TABLET(10 MG) BY MOUTH DAILY   Fluticasone-Umeclidin-Vilant (TRELEGY ELLIPTA ) 100-62.5-25 MCG/ACT AEPB Inhale 1 puff into the lungs daily.   Furosemide  (FUROSCIX ) 80 MG/10ML CTKT Inject 1 Dose into the skin daily as needed (for swelling). Call the Heart Failure Clinic for further instruction before taking this medicine   ipratropium (ATROVENT ) 0.03 % nasal spray USE 2 SPRAYS IN EACH NOSTRIL EVERY 8 HOURS AS NEEDED   JARDIANCE  10 MG TABS tablet TAKE 1 TABLET(10 MG) BY MOUTH DAILY BEFORE BREAKFAST   levocetirizine (  XYZAL) 5 MG tablet Take 2.5 mg by mouth every other day as needed for allergies.   metolazone  (ZAROXOLYN ) 2.5 MG tablet Take 1 tablet (2.5 mg total) by mouth once a week. You may take an extra 1 tab as needed for swelling   montelukast  (SINGULAIR ) 10 MG tablet TAKE 1 TABLET(10 MG) BY MOUTH DAILYTAKE 1 TABLET(10 MG) BY MOUTH DAILY   Multiple Vitamins-Minerals (EYE VITAMINS PO) Take 2 tablets by mouth daily. Ocuvite 50+   ondansetron  (ZOFRAN ) 4 MG tablet Take 1 tablet (4 mg total) by mouth every 8 (eight) hours as needed for nausea or vomiting.   oxyCODONE  (ROXICODONE ) 5 MG immediate release tablet Take 1 tablet (5 mg total) by mouth every 6 (six) hours as needed for severe pain (pain score 7-10).   polyethylene glycol  (MIRALAX  / GLYCOLAX ) 17 g packet Take 17 g by mouth daily as needed.   potassium chloride  SA (KLOR-CON  M) 20 MEQ tablet Take 2 tablets (40 mEq total) by mouth 2 (two) times daily.   pramipexole  (MIRAPEX ) 0.75 MG tablet TAKE 1 TABLET(0.75 MG) BY MOUTH AT BEDTIME   spironolactone  (ALDACTONE ) 25 MG tablet TAKE 1 TABLET(25 MG) BY MOUTH DAILY   tirzepatide  (MOUNJARO ) 2.5 MG/0.5ML Pen Inject 2.5 mg into the skin once a week.   tirzepatide  (MOUNJARO ) 5 MG/0.5ML Pen Inject 5 mg into the skin once a week.   torsemide  (DEMADEX ) 100 MG tablet Take 1 tablet (100 mg total) by mouth daily.   triamcinolone  (NASACORT ) 55 MCG/ACT AERO nasal inhaler Place 2 sprays into the nose daily.   triamcinolone  ointment (KENALOG ) 0.5 % Apply 1 Application topically 2 (two) times daily as needed.     Allergies:   Erythromycin, Losartan, Metoprolol, Simvastatin, Dexlansoprazole, Lisinopril, and Prednisone    Social History   Socioeconomic History   Marital status: Divorced    Spouse name: Not on file   Number of children: 1   Years of education: Not on file   Highest education level: Bachelor's degree (e.g., BA, AB, BS)  Occupational History   Occupation: retired in 2001    Comment: social work production designer, theatre/television/film  Tobacco Use   Smoking status: Former    Current packs/day: 0.00    Average packs/day: 1 pack/day for 11.0 years (11.0 ttl pk-yrs)    Types: Cigarettes    Start date: 11/29/1965    Quit date: 11/28/1976    Years since quitting: 47.8   Smokeless tobacco: Never  Vaping Use   Vaping status: Never Used  Substance and Sexual Activity   Alcohol use: Not Currently    Comment: rarely, at holidays   Drug use: No   Sexual activity: Not Currently  Other Topics Concern   Not on file  Social History Narrative   Not on file   Social Drivers of Health   Financial Resource Strain: Low Risk  (07/18/2024)   Overall Financial Resource Strain (CARDIA)    Difficulty of Paying Living Expenses: Not hard at all  Food  Insecurity: No Food Insecurity (07/18/2024)   Hunger Vital Sign    Worried About Running Out of Food in the Last Year: Never true    Ran Out of Food in the Last Year: Never true  Transportation Needs: No Transportation Needs (07/18/2024)   PRAPARE - Administrator, Civil Service (Medical): No    Lack of Transportation (Non-Medical): No  Physical Activity: Inactive (07/18/2024)   Exercise Vital Sign    Days of Exercise per Week: 0 days  Minutes of Exercise per Session: 0 min  Stress: No Stress Concern Present (07/18/2024)   [employer] of Occupational Health - Occupational Stress Questionnaire    Feeling of Stress: Not at all  Social Connections: Socially Isolated (07/18/2024)   Social Connection and Isolation Panel    Frequency of Communication with Friends and Family: More than three times a week    Frequency of Social Gatherings with Friends and Family: Not on file    Attends Religious Services: Never    Database Administrator or Organizations: No    Attends Engineer, Structural: Never    Marital Status: Divorced     Family History: The patient's family history includes Diabetes in her father; Heart disease (age of onset: 85) in her father; Hyperlipidemia in her mother; Hypertension in her father and mother; Myelodysplastic syndrome in her sister; Prostate cancer in her paternal grandfather; Seizures in her mother. There is no history of Breast cancer or Cervical cancer.  ROS:   Please see the history of present illness.     All other systems reviewed and are negative.  EKGs/Labs/Other Studies Reviewed:    The following studies were reviewed today:   Recent Labs: 05/14/2024: BNP 24.9 07/26/2024: ALT 10 08/28/2024: BUN 23; Creatinine, Ser 1.51; Potassium 4.7; Sodium 136  Recent Lipid Panel    Component Value Date/Time   CHOL 205 (H) 07/26/2024 1605   TRIG 138 07/26/2024 1605   HDL 62 07/26/2024 1605   CHOLHDL 3.3 07/26/2024 1605   LDLCALC 119 (H)  07/26/2024 1605     Risk Assessment/Calculations:      Physical Exam:    VS:  BP 128/64 (BP Location: Left Arm, Patient Position: Sitting, Cuff Size: Normal)   Pulse 79   Ht 5' 1 (1.549 m)   Wt 204 lb 3.2 oz (92.6 kg)   SpO2 97%   BMI 38.58 kg/m     Wt Readings from Last 3 Encounters:  10/03/24 204 lb 3.2 oz (92.6 kg)  09/20/24 207 lb (93.9 kg)  09/06/24 203 lb 1.6 oz (92.1 kg)     GEN:  Well nourished, well developed in no acute distress HEENT: Normal NECK: Unable to assess JVD due to body habitus CARDIAC: RRR, no murmurs, rubs, gallops RESPIRATORY: Decreased breath sounds at bases ABDOMEN: Soft, non-tender, non-distended MUSCULOSKELETAL:  2+ edema; right foot in Unna boot SKIN: Warm and dry NEUROLOGIC:  Alert and oriented x 3 PSYCHIATRIC:  Normal affect   ASSESSMENT:    1. Chronic diastolic heart failure (HCC)   2. Primary hypertension   3. BMI 38.0-38.9,adult    PLAN:    In order of problems listed above:  HFpEF, bilateral lower extremity edema, varicose veins.  Advised to use Ace wraps and lower extremity to help with edema.  Continue metolazone  to twice weekly, Tuesdays and Saturdays, continue torsemide  100 mg daily, Aldactone  25 mg daily, Jardiance  10 mg daily.  Hypertension, BP controlled. continue Aldactone  25 mg daily. Morbid obesity, low-calorie diet, weight loss again advised.continue Mounjaro .  Follow-up in 6 to 12 months   Medication Adjustments/Labs and Tests Ordered: Current medicines are reviewed at length with the patient today.  Concerns regarding medicines are outlined above.  Orders Placed This Encounter  Procedures   EKG 12-Lead    No orders of the defined types were placed in this encounter.     Patient Instructions  Medication Instructions:  Your physician recommends that you continue on your current medications as directed. Please refer to the  Current Medication list given to you today.   *If you need a refill on your cardiac  medications before your next appointment, please call your pharmacy*  Lab Work: No labs ordered today  If you have labs (blood work) drawn today and your tests are completely normal, you will receive your results only by: MyChart Message (if you have MyChart) OR A paper copy in the mail If you have any lab test that is abnormal or we need to change your treatment, we will call you to review the results.  Testing/Procedures: No test ordered today   Follow-Up: At [facility], you and your health needs are our priority.  As part of our continuing mission to provide you with exceptional heart care, our providers are all part of one team.  This team includes your primary Cardiologist (physician) and Advanced Practice Providers or APPs (Physician Assistants and Nurse Practitioners) who all work together to provide you with the care you need, when you need it.  Your next appointment:   6 month(s)  Provider:   You may see Redell Cave, MD or one of the following Advanced Practice Providers on your designated Care Team:   Lonni Meager, NP Lesley Maffucci, PA-C Bernardino Bring, PA-C Cadence Ricardo, PA-C Tylene Lunch, NP Barnie Hila, NP    We recommend signing up for the patient portal called MyChart.  Sign up information is provided on this After Visit Summary.  MyChart is used to connect with patients for Virtual Visits (Telemedicine).  Patients are able to view lab/test results, encounter notes, upcoming appointments, etc.  Non-urgent messages can be sent to your provider as well.   To learn more about what you can do with MyChart, go to [email or web address].            Signed, Redell Cave, MD  10/03/2024 12:21 PM    [facility]

## 2024-10-08 ENCOUNTER — Encounter: Payer: Self-pay | Admitting: Physician Assistant

## 2024-10-08 ENCOUNTER — Ambulatory Visit: Admitting: Physician Assistant

## 2024-10-08 VITALS — BP 122/78 | Ht 61.0 in | Wt 204.0 lb

## 2024-10-08 DIAGNOSIS — S32028A Other fracture of second lumbar vertebra, initial encounter for closed fracture: Secondary | ICD-10-CM | POA: Diagnosis not present

## 2024-10-08 DIAGNOSIS — S32020A Wedge compression fracture of second lumbar vertebra, initial encounter for closed fracture: Secondary | ICD-10-CM

## 2024-10-08 DIAGNOSIS — M4814 Ankylosing hyperostosis [Forestier], thoracic region: Secondary | ICD-10-CM | POA: Diagnosis not present

## 2024-10-08 DIAGNOSIS — S32020D Wedge compression fracture of second lumbar vertebra, subsequent encounter for fracture with routine healing: Secondary | ICD-10-CM

## 2024-10-24 ENCOUNTER — Ambulatory Visit: Payer: Self-pay

## 2024-10-24 NOTE — Telephone Encounter (Signed)
 FYI Only or Action Required?: FYI only for provider: ED advised.  Patient was last seen in primary care on 09/06/2024 by Myrla Jon HERO, MD.  Called Nurse Triage reporting Back Pain.  Symptoms began several days ago.  Interventions attempted: Prescription medications: oxycodone .  Symptoms are: unchanged.  Triage Disposition: Go to ED Now (or PCP Triage) (overriding See HCP Within 4 Hours (Or PCP Triage))  Patient/caregiver understands and will follow disposition?: Yes     Copied from CRM #[id number]. Topic: Clinical - Red Word Triage >> Oct 24, 2024  4:09 PM Tobias L wrote: Red Word that prompted transfer to Nurse Triage: has re-hurt back, severe pain, twisted in the shower Reason for Disposition  [1] SEVERE back pain (e.g., excruciating, unable to do any normal activities) AND [2] not improved 2 hours after pain medicine  Answer Assessment - Initial Assessment Questions Pt states she recently saw PCP and Neurosurgeon for this and was doing better. Friday night in the shower, she turned and did something to her back. She states she had some oxycodone  left over from something else she's dealing with and had been taking those. She states she is about out and would like a refill. She states it hurts to reach forward or to sit down or stand up. She states the pain is excruciating. She states that even with the pain meds in the pain is high (would not give a number). She states pain is near her hip and radiates up. She denies any chest pain, shortness of breath, or urinary symptoms. RN did advise patient to go to the ER to be evaluated given that she is still in significant pain even with the pain medication. Pt stated understanding.       1. ONSET: When did the pain begin? (e.g., minutes, hours, days)     Friday 2. LOCATION: Where does it hurt? (upper, mid or lower back)     Left hip and up 3. SEVERITY: How bad is the pain?  (e.g., Scale 1-10; mild, moderate, or severe)      severe 4. PATTERN: Is the pain constant? (e.g., yes, no; constant, intermittent)      Constant, gets slightly better with pain meds 5. RADIATION: Does the pain shoot into your legs or somewhere else?     denies 6. CAUSE:  What do you think is causing the back pain?      Twisted in the shower 7. BACK OVERUSE:  Any recent lifting of heavy objects, strenuous work or exercise?     no 8. MEDICINES: What have you taken so far for the pain? (e.g., nothing, acetaminophen , NSAIDS)     oxycodone  9. NEUROLOGIC SYMPTOMS: Do you have any weakness, numbness, or problems with bowel/bladder control?     denies 10. OTHER SYMPTOMS: Do you have any other symptoms? (e.g., fever, abdomen pain, burning with urination, blood in urine)       denies  Protocols used: Back Pain-A-AH

## 2024-11-02 ENCOUNTER — Telehealth: Payer: Self-pay | Admitting: Family Medicine

## 2024-11-02 NOTE — Telephone Encounter (Signed)
 [facility] faxed refill request for the following medications:  tirzepatide  (MOUNJARO ) 5 MG/0.5ML Pen     Please advise.

## 2024-11-05 ENCOUNTER — Other Ambulatory Visit: Payer: Self-pay

## 2024-11-06 ENCOUNTER — Ambulatory Visit: Admitting: Family Medicine

## 2024-11-06 MED ORDER — MOUNJARO 5 MG/0.5ML ~~LOC~~ SOAJ: 5.0000 mg | SUBCUTANEOUS | 2 refills | Status: AC

## 2024-11-07 ENCOUNTER — Other Ambulatory Visit: Payer: Self-pay | Admitting: Internal Medicine

## 2024-11-27 ENCOUNTER — Ambulatory Visit: Admitting: Family Medicine

## 2025-01-01 ENCOUNTER — Ambulatory Visit: Admitting: Family Medicine

## 2025-03-21 ENCOUNTER — Ambulatory Visit: Admitting: Family

## 2025-04-02 ENCOUNTER — Ambulatory Visit: Admitting: Cardiology

## 2025-07-24 ENCOUNTER — Ambulatory Visit
